# Patient Record
Sex: Female | Born: 1935 | Race: White | Hispanic: No | Smoking: Never smoker
Health system: Southern US, Community
[De-identification: ages and names within clinical notes are randomized; demographics above are authoritative.]

## PROBLEM LIST (undated history)

## (undated) DIAGNOSIS — I82609 Acute embolism and thrombosis of unspecified veins of unspecified upper extremity: Secondary | ICD-10-CM

## (undated) DIAGNOSIS — D649 Anemia, unspecified: Secondary | ICD-10-CM

## (undated) DIAGNOSIS — J690 Pneumonitis due to inhalation of food and vomit: Secondary | ICD-10-CM

## (undated) DIAGNOSIS — M1990 Unspecified osteoarthritis, unspecified site: Secondary | ICD-10-CM

## (undated) DIAGNOSIS — R000 Tachycardia, unspecified: Secondary | ICD-10-CM

## (undated) DIAGNOSIS — I499 Cardiac arrhythmia, unspecified: Secondary | ICD-10-CM

## (undated) DIAGNOSIS — K219 Gastro-esophageal reflux disease without esophagitis: Secondary | ICD-10-CM

## (undated) DIAGNOSIS — Z9289 Personal history of other medical treatment: Secondary | ICD-10-CM

## (undated) DIAGNOSIS — Z8719 Personal history of other diseases of the digestive system: Secondary | ICD-10-CM

## (undated) HISTORY — PX: BREAST ENHANCEMENT SURGERY: SHX7

## (undated) HISTORY — PX: ABDOMINAL HYSTERECTOMY: SHX81

## (undated) HISTORY — PX: APPENDECTOMY: SHX54

## (undated) HISTORY — PX: CATARACT EXTRACTION, BILATERAL: SHX1313

---

## 1998-12-02 ENCOUNTER — Other Ambulatory Visit: Admission: RE | Admit: 1998-12-02 | Discharge: 1998-12-02 | Payer: Self-pay | Admitting: Obstetrics and Gynecology

## 1999-07-29 ENCOUNTER — Encounter: Payer: Self-pay | Admitting: Neurosurgery

## 1999-07-29 ENCOUNTER — Encounter: Admission: RE | Admit: 1999-07-29 | Discharge: 1999-07-29 | Payer: Self-pay | Admitting: Neurosurgery

## 1999-12-30 ENCOUNTER — Other Ambulatory Visit: Admission: RE | Admit: 1999-12-30 | Discharge: 1999-12-30 | Payer: Self-pay | Admitting: Oral Surgery

## 2000-01-20 ENCOUNTER — Encounter: Payer: Self-pay | Admitting: Family Medicine

## 2000-01-20 ENCOUNTER — Encounter: Admission: RE | Admit: 2000-01-20 | Discharge: 2000-01-20 | Payer: Self-pay | Admitting: Family Medicine

## 2000-04-25 ENCOUNTER — Other Ambulatory Visit: Admission: RE | Admit: 2000-04-25 | Discharge: 2000-04-25 | Payer: Self-pay | Admitting: Obstetrics and Gynecology

## 2001-03-01 ENCOUNTER — Encounter: Payer: Self-pay | Admitting: Obstetrics and Gynecology

## 2001-03-01 ENCOUNTER — Encounter: Admission: RE | Admit: 2001-03-01 | Discharge: 2001-03-01 | Payer: Self-pay | Admitting: Obstetrics and Gynecology

## 2001-06-11 ENCOUNTER — Encounter: Payer: Self-pay | Admitting: Family Medicine

## 2001-06-11 ENCOUNTER — Encounter: Admission: RE | Admit: 2001-06-11 | Discharge: 2001-06-11 | Payer: Self-pay | Admitting: Family Medicine

## 2001-06-18 ENCOUNTER — Encounter: Payer: Self-pay | Admitting: Family Medicine

## 2001-06-18 ENCOUNTER — Encounter: Admission: RE | Admit: 2001-06-18 | Discharge: 2001-06-18 | Payer: Self-pay | Admitting: Family Medicine

## 2003-01-08 ENCOUNTER — Ambulatory Visit (HOSPITAL_COMMUNITY): Admission: RE | Admit: 2003-01-08 | Discharge: 2003-01-08 | Payer: Self-pay | Admitting: Neurosurgery

## 2003-01-08 ENCOUNTER — Encounter: Payer: Self-pay | Admitting: Neurosurgery

## 2003-05-13 ENCOUNTER — Inpatient Hospital Stay (HOSPITAL_COMMUNITY): Admission: RE | Admit: 2003-05-13 | Discharge: 2003-05-26 | Payer: Self-pay | Admitting: Neurosurgery

## 2003-10-31 ENCOUNTER — Encounter: Admission: RE | Admit: 2003-10-31 | Discharge: 2003-10-31 | Payer: Self-pay | Admitting: Obstetrics and Gynecology

## 2005-04-05 ENCOUNTER — Encounter: Admission: RE | Admit: 2005-04-05 | Discharge: 2005-04-05 | Payer: Self-pay | Admitting: Family Medicine

## 2005-08-08 ENCOUNTER — Ambulatory Visit: Payer: Self-pay | Admitting: Oncology

## 2005-09-15 LAB — CBC & DIFF AND RETIC
BASO%: 0.7 % (ref 0.0–2.0)
Basophils Absolute: 0.1 10*3/uL (ref 0.0–0.1)
EOS%: 2.6 % (ref 0.0–7.0)
HCT: 40.8 % (ref 34.8–46.6)
HGB: 13.5 g/dL (ref 11.6–15.9)
LYMPH%: 19 % (ref 14.0–48.0)
MCH: 31 pg (ref 26.0–34.0)
MCHC: 33 g/dL (ref 32.0–36.0)
MCV: 93.7 fL (ref 81.0–101.0)
MONO%: 7.9 % (ref 0.0–13.0)
NEUT%: 69.8 % (ref 39.6–76.8)

## 2005-12-20 ENCOUNTER — Ambulatory Visit: Payer: Self-pay | Admitting: Oncology

## 2006-03-24 ENCOUNTER — Inpatient Hospital Stay (HOSPITAL_COMMUNITY): Admission: EM | Admit: 2006-03-24 | Discharge: 2006-04-02 | Payer: Self-pay | Admitting: Emergency Medicine

## 2006-03-24 ENCOUNTER — Ambulatory Visit: Payer: Self-pay | Admitting: Internal Medicine

## 2006-03-27 ENCOUNTER — Encounter: Payer: Self-pay | Admitting: Cardiology

## 2006-04-11 ENCOUNTER — Inpatient Hospital Stay (HOSPITAL_COMMUNITY): Admission: EM | Admit: 2006-04-11 | Discharge: 2006-04-17 | Payer: Self-pay | Admitting: Emergency Medicine

## 2006-05-22 ENCOUNTER — Encounter: Payer: Self-pay | Admitting: Emergency Medicine

## 2006-05-22 ENCOUNTER — Ambulatory Visit: Payer: Self-pay | Admitting: *Deleted

## 2006-05-23 ENCOUNTER — Inpatient Hospital Stay (HOSPITAL_COMMUNITY): Admission: EM | Admit: 2006-05-23 | Discharge: 2006-05-30 | Payer: Self-pay | Admitting: *Deleted

## 2006-10-18 ENCOUNTER — Encounter: Admission: RE | Admit: 2006-10-18 | Discharge: 2006-10-18 | Payer: Self-pay | Admitting: Family Medicine

## 2008-01-25 ENCOUNTER — Ambulatory Visit: Payer: Self-pay | Admitting: Oncology

## 2010-03-29 ENCOUNTER — Ambulatory Visit: Payer: Self-pay | Admitting: Oncology

## 2010-03-31 ENCOUNTER — Observation Stay (HOSPITAL_COMMUNITY)
Admission: AD | Admit: 2010-03-31 | Discharge: 2010-04-01 | Payer: Self-pay | Source: Home / Self Care | Attending: Oncology | Admitting: Oncology

## 2010-03-31 LAB — CBC WITH DIFFERENTIAL/PLATELET
BASO%: 0.5 % (ref 0.0–2.0)
EOS%: 0 % (ref 0.0–7.0)
LYMPH%: 7.8 % — ABNORMAL LOW (ref 14.0–49.7)
MCH: 16.5 pg — ABNORMAL LOW (ref 25.1–34.0)
MCHC: 28.4 g/dL — ABNORMAL LOW (ref 31.5–36.0)
MCV: 58.1 fL — ABNORMAL LOW (ref 79.5–101.0)
MONO%: 3.6 % (ref 0.0–14.0)
NEUT#: 15.1 10*3/uL — ABNORMAL HIGH (ref 1.5–6.5)
Platelets: 1134 10*3/uL — ABNORMAL HIGH (ref 145–400)
RBC: 3.96 10*6/uL (ref 3.70–5.45)
RDW: 21.7 % — ABNORMAL HIGH (ref 11.2–14.5)

## 2010-03-31 LAB — COMPREHENSIVE METABOLIC PANEL
ALT: 13 U/L (ref 0–35)
CO2: 21 mEq/L (ref 19–32)
Calcium: 9.4 mg/dL (ref 8.4–10.5)
Chloride: 103 mEq/L (ref 96–112)
Creatinine, Ser: 0.92 mg/dL (ref 0.40–1.20)
Glucose, Bld: 124 mg/dL — ABNORMAL HIGH (ref 70–99)
Total Bilirubin: 0.2 mg/dL — ABNORMAL LOW (ref 0.3–1.2)
Total Protein: 6.8 g/dL (ref 6.0–8.3)

## 2010-03-31 LAB — CHCC SMEAR

## 2010-03-31 LAB — FERRITIN: Ferritin: 3 ng/mL — ABNORMAL LOW (ref 10–291)

## 2010-03-31 LAB — VITAMIN B12: Vitamin B-12: 418 pg/mL (ref 211–911)

## 2010-04-08 LAB — CBC WITH DIFFERENTIAL/PLATELET
Basophils Absolute: 0.1 10*3/uL (ref 0.0–0.1)
EOS%: 0 % (ref 0.0–7.0)
HGB: 9.9 g/dL — ABNORMAL LOW (ref 11.6–15.9)
LYMPH%: 15.9 % (ref 14.0–49.7)
MCH: 19.8 pg — ABNORMAL LOW (ref 25.1–34.0)
MCV: 66.1 fL — ABNORMAL LOW (ref 79.5–101.0)
MONO%: 4.5 % (ref 0.0–14.0)
NEUT%: 79.1 % — ABNORMAL HIGH (ref 38.4–76.8)
Platelets: 847 10*3/uL — ABNORMAL HIGH (ref 145–400)
RDW: 31.8 % — ABNORMAL HIGH (ref 11.2–14.5)

## 2010-04-21 ENCOUNTER — Ambulatory Visit (HOSPITAL_BASED_OUTPATIENT_CLINIC_OR_DEPARTMENT_OTHER): Payer: MEDICARE | Admitting: Oncology

## 2010-04-21 LAB — CBC WITH DIFFERENTIAL/PLATELET
BASO%: 1.3 % (ref 0.0–2.0)
Basophils Absolute: 0.2 10*3/uL — ABNORMAL HIGH (ref 0.0–0.1)
EOS%: 0.1 % (ref 0.0–7.0)
Eosinophils Absolute: 0 10*3/uL (ref 0.0–0.5)
HCT: 35.2 % (ref 34.8–46.6)
HGB: 9.8 g/dL — ABNORMAL LOW (ref 11.6–15.9)
LYMPH%: 25.5 % (ref 14.0–49.7)
MCH: 19.7 pg — ABNORMAL LOW (ref 25.1–34.0)
MCHC: 27.8 g/dL — ABNORMAL LOW (ref 31.5–36.0)
MCV: 70.8 fL — ABNORMAL LOW (ref 79.5–101.0)
MONO#: 1.2 10*3/uL — ABNORMAL HIGH (ref 0.1–0.9)
MONO%: 9 % (ref 0.0–14.0)
NEUT#: 8.7 10*3/uL — ABNORMAL HIGH (ref 1.5–6.5)
NEUT%: 64.1 % (ref 38.4–76.8)
Platelets: 795 10*3/uL — ABNORMAL HIGH (ref 145–400)
RBC: 4.97 10*6/uL (ref 3.70–5.45)
RDW: 28.5 % — ABNORMAL HIGH (ref 11.2–14.5)
WBC: 13.5 10*3/uL — ABNORMAL HIGH (ref 3.9–10.3)
lymph#: 3.4 10*3/uL — ABNORMAL HIGH (ref 0.9–3.3)
nRBC: 0 % (ref 0–0)

## 2010-05-05 LAB — CBC WITH DIFFERENTIAL/PLATELET
BASO%: 1 % (ref 0.0–2.0)
Basophils Absolute: 0.1 10*3/uL (ref 0.0–0.1)
EOS%: 0.1 % (ref 0.0–7.0)
Eosinophils Absolute: 0 10*3/uL (ref 0.0–0.5)
HCT: 34.7 % — ABNORMAL LOW (ref 34.8–46.6)
HGB: 10.6 g/dL — ABNORMAL LOW (ref 11.6–15.9)
LYMPH%: 28.2 % (ref 14.0–49.7)
MCH: 22.5 pg — ABNORMAL LOW (ref 25.1–34.0)
MCHC: 30.6 g/dL — ABNORMAL LOW (ref 31.5–36.0)
MCV: 73.5 fL — ABNORMAL LOW (ref 79.5–101.0)
MONO#: 0.8 10*3/uL (ref 0.1–0.9)
MONO%: 8.2 % (ref 0.0–14.0)
NEUT#: 6.1 10*3/uL (ref 1.5–6.5)
NEUT%: 62.5 % (ref 38.4–76.8)
Platelets: 793 10*3/uL — ABNORMAL HIGH (ref 145–400)
RBC: 4.72 10*6/uL (ref 3.70–5.45)
RDW: 35.1 % — ABNORMAL HIGH (ref 11.2–14.5)
WBC: 9.7 10*3/uL (ref 3.9–10.3)
lymph#: 2.7 10*3/uL (ref 0.9–3.3)

## 2010-05-09 ENCOUNTER — Encounter: Payer: Self-pay | Admitting: Family Medicine

## 2010-05-21 ENCOUNTER — Encounter (HOSPITAL_BASED_OUTPATIENT_CLINIC_OR_DEPARTMENT_OTHER): Payer: MEDICARE | Admitting: Oncology

## 2010-05-21 DIAGNOSIS — D473 Essential (hemorrhagic) thrombocythemia: Secondary | ICD-10-CM

## 2010-05-21 DIAGNOSIS — D509 Iron deficiency anemia, unspecified: Secondary | ICD-10-CM

## 2010-05-21 DIAGNOSIS — R000 Tachycardia, unspecified: Secondary | ICD-10-CM

## 2010-05-21 DIAGNOSIS — L439 Lichen planus, unspecified: Secondary | ICD-10-CM

## 2010-05-21 DIAGNOSIS — D472 Monoclonal gammopathy: Secondary | ICD-10-CM

## 2010-05-21 LAB — CBC WITH DIFFERENTIAL/PLATELET
BASO%: 0.7 % (ref 0.0–2.0)
Eosinophils Absolute: 0 10*3/uL (ref 0.0–0.5)
LYMPH%: 18.8 % (ref 14.0–49.7)
MCHC: 31 g/dL — ABNORMAL LOW (ref 31.5–36.0)
MONO#: 0.7 10*3/uL (ref 0.1–0.9)
NEUT#: 9.3 10*3/uL — ABNORMAL HIGH (ref 1.5–6.5)
RBC: 5.15 10*6/uL (ref 3.70–5.45)
RDW: 29.1 % — ABNORMAL HIGH (ref 11.2–14.5)
WBC: 12.4 10*3/uL — ABNORMAL HIGH (ref 3.9–10.3)
lymph#: 2.3 10*3/uL (ref 0.9–3.3)

## 2010-06-03 ENCOUNTER — Emergency Department (HOSPITAL_COMMUNITY): Payer: MEDICARE

## 2010-06-03 ENCOUNTER — Inpatient Hospital Stay (HOSPITAL_COMMUNITY)
Admission: EM | Admit: 2010-06-03 | Discharge: 2010-06-17 | DRG: 391 | Disposition: A | Discharge status: Home Health | Payer: MEDICARE | Attending: Internal Medicine | Admitting: Internal Medicine

## 2010-06-03 DIAGNOSIS — F3289 Other specified depressive episodes: Secondary | ICD-10-CM | POA: Diagnosis present

## 2010-06-03 DIAGNOSIS — A09 Infectious gastroenteritis and colitis, unspecified: Principal | ICD-10-CM | POA: Diagnosis present

## 2010-06-03 DIAGNOSIS — G4700 Insomnia, unspecified: Secondary | ICD-10-CM | POA: Diagnosis present

## 2010-06-03 DIAGNOSIS — R0789 Other chest pain: Secondary | ICD-10-CM | POA: Diagnosis present

## 2010-06-03 DIAGNOSIS — D649 Anemia, unspecified: Secondary | ICD-10-CM | POA: Diagnosis present

## 2010-06-03 DIAGNOSIS — F329 Major depressive disorder, single episode, unspecified: Secondary | ICD-10-CM | POA: Diagnosis present

## 2010-06-03 DIAGNOSIS — E860 Dehydration: Secondary | ICD-10-CM | POA: Diagnosis present

## 2010-06-03 DIAGNOSIS — G8929 Other chronic pain: Secondary | ICD-10-CM | POA: Diagnosis present

## 2010-06-03 DIAGNOSIS — R5381 Other malaise: Secondary | ICD-10-CM | POA: Diagnosis present

## 2010-06-03 DIAGNOSIS — I498 Other specified cardiac arrhythmias: Secondary | ICD-10-CM | POA: Diagnosis present

## 2010-06-03 DIAGNOSIS — M549 Dorsalgia, unspecified: Secondary | ICD-10-CM | POA: Diagnosis present

## 2010-06-03 DIAGNOSIS — F411 Generalized anxiety disorder: Secondary | ICD-10-CM | POA: Diagnosis present

## 2010-06-03 DIAGNOSIS — J189 Pneumonia, unspecified organism: Secondary | ICD-10-CM | POA: Diagnosis present

## 2010-06-03 DIAGNOSIS — E876 Hypokalemia: Secondary | ICD-10-CM | POA: Diagnosis present

## 2010-06-03 DIAGNOSIS — K219 Gastro-esophageal reflux disease without esophagitis: Secondary | ICD-10-CM | POA: Diagnosis present

## 2010-06-03 DIAGNOSIS — D473 Essential (hemorrhagic) thrombocythemia: Secondary | ICD-10-CM | POA: Diagnosis present

## 2010-06-03 LAB — DIFFERENTIAL
Basophils Absolute: 0.2 10*3/uL — ABNORMAL HIGH (ref 0.0–0.1)
Eosinophils Relative: 0 % (ref 0–5)
Lymphocytes Relative: 8 % — ABNORMAL LOW (ref 12–46)
Lymphs Abs: 1.2 10*3/uL (ref 0.7–4.0)
Monocytes Relative: 4 % (ref 3–12)
Neutrophils Relative %: 87 % — ABNORMAL HIGH (ref 43–77)

## 2010-06-03 LAB — LIPASE, BLOOD: Lipase: 52 U/L (ref 11–59)

## 2010-06-03 LAB — COMPREHENSIVE METABOLIC PANEL
ALT: 15 U/L (ref 0–35)
Albumin: 3.2 g/dL — ABNORMAL LOW (ref 3.5–5.2)
Alkaline Phosphatase: 56 U/L (ref 39–117)
Chloride: 108 mEq/L (ref 96–112)
Glucose, Bld: 186 mg/dL — ABNORMAL HIGH (ref 70–99)
Potassium: 3.9 mEq/L (ref 3.5–5.1)
Sodium: 138 mEq/L (ref 135–145)
Total Bilirubin: 0.2 mg/dL — ABNORMAL LOW (ref 0.3–1.2)
Total Protein: 6.8 g/dL (ref 6.0–8.3)

## 2010-06-03 LAB — CBC
HCT: 35.2 % — ABNORMAL LOW (ref 36.0–46.0)
MCV: 77 fL — ABNORMAL LOW (ref 78.0–100.0)
RBC: 4.57 MIL/uL (ref 3.87–5.11)
RDW: 21.4 % — ABNORMAL HIGH (ref 11.5–15.5)
WBC: 15 10*3/uL — ABNORMAL HIGH (ref 4.0–10.5)

## 2010-06-03 MED ORDER — IOHEXOL 300 MG/ML  SOLN
80.0000 mL | Freq: Once | INTRAMUSCULAR | Status: AC | PRN
  Administered 2010-06-03: 80 mL via INTRAVENOUS

## 2010-06-04 LAB — DIFFERENTIAL
Basophils Relative: 0 % (ref 0–1)
Eosinophils Absolute: 0 10*3/uL (ref 0.0–0.7)
Neutro Abs: 7 10*3/uL (ref 1.7–7.7)
Neutrophils Relative %: 63 % (ref 43–77)

## 2010-06-04 LAB — URINE MICROSCOPIC-ADD ON

## 2010-06-04 LAB — CBC
Hemoglobin: 9.4 g/dL — ABNORMAL LOW (ref 12.0–15.0)
Platelets: 659 10*3/uL — ABNORMAL HIGH (ref 150–400)
RBC: 4.12 MIL/uL (ref 3.87–5.11)
WBC: 11.1 10*3/uL — ABNORMAL HIGH (ref 4.0–10.5)

## 2010-06-04 LAB — COMPREHENSIVE METABOLIC PANEL
ALT: 13 U/L (ref 0–35)
AST: 22 U/L (ref 0–37)
Albumin: 2.8 g/dL — ABNORMAL LOW (ref 3.5–5.2)
Alkaline Phosphatase: 47 U/L (ref 39–117)
Chloride: 106 mEq/L (ref 96–112)
Creatinine, Ser: 0.73 mg/dL (ref 0.4–1.2)
GFR calc Af Amer: >60 mL/min (ref >60)
Potassium: 3.7 mEq/L (ref 3.5–5.1)
Sodium: 139 mEq/L (ref 135–145)
Total Bilirubin: 0.2 mg/dL — ABNORMAL LOW (ref 0.3–1.2)

## 2010-06-04 LAB — URINALYSIS, ROUTINE W REFLEX MICROSCOPIC
Bilirubin Urine: NEGATIVE
Hgb urine dipstick: NEGATIVE
Ketones, ur: 15 mg/dL — AB
Specific Gravity, Urine: 1.031 — ABNORMAL HIGH (ref 1.005–1.030)
Urobilinogen, UA: 0.2 mg/dL (ref 0.0–1.0)
pH: 7.5 (ref 5.0–8.0)

## 2010-06-04 LAB — T4, FREE: Free T4: 1.02 ng/dL (ref 0.80–1.80)

## 2010-06-04 LAB — CARDIAC PANEL(CRET KIN+CKTOT+MB+TROPI)
CK, MB: 3.1 ng/mL (ref 0.3–4.0)
Total CK: 46 U/L (ref 7–177)
Troponin I: 0.01 ng/mL (ref 0.00–0.06)
Troponin I: <0.01 ng/mL (ref 0.00–0.06)

## 2010-06-04 LAB — TSH: TSH: 1.221 u[IU]/mL (ref 0.350–4.500)

## 2010-06-04 LAB — T3, FREE: T3, Free: 2.2 pg/mL — ABNORMAL LOW (ref 2.3–4.2)

## 2010-06-05 LAB — CBC
HCT: 36.2 % (ref 36.0–46.0)
Platelets: 636 10*3/uL — ABNORMAL HIGH (ref 150–400)
RDW: 21.1 % — ABNORMAL HIGH (ref 11.5–15.5)
WBC: 11.1 10*3/uL — ABNORMAL HIGH (ref 4.0–10.5)

## 2010-06-05 LAB — BASIC METABOLIC PANEL
GFR calc non Af Amer: >60 mL/min (ref >60)
Glucose, Bld: 109 mg/dL — ABNORMAL HIGH (ref 70–99)
Potassium: 3.4 mEq/L — ABNORMAL LOW (ref 3.5–5.1)
Sodium: 141 mEq/L (ref 135–145)

## 2010-06-05 LAB — MAGNESIUM: Magnesium: 2 mg/dL (ref 1.5–2.5)

## 2010-06-06 LAB — BASIC METABOLIC PANEL
CO2: 23 mEq/L (ref 19–32)
Glucose, Bld: 90 mg/dL (ref 70–99)
Potassium: 3.3 mEq/L — ABNORMAL LOW (ref 3.5–5.1)
Sodium: 139 mEq/L (ref 135–145)

## 2010-06-06 LAB — CBC
HCT: 35 % — ABNORMAL LOW (ref 36.0–46.0)
Hemoglobin: 10.2 g/dL — ABNORMAL LOW (ref 12.0–15.0)
RBC: 4.5 MIL/uL (ref 3.87–5.11)
WBC: 9.3 10*3/uL (ref 4.0–10.5)

## 2010-06-07 NOTE — H&P (Signed)
NAMEPALMER, [name]                ACCOUNT NO.:  [id number]  MEDICAL RECORD NO.:  [id number]           PATIENT TYPE:  LOCATION:                                 FACILITY:  PHYSICIAN:  Talmage Nap, MD  DATE OF BIRTH:  Apr 16, 1933  DATE OF ADMISSION: 06/03/2010 DATE OF DISCHARGE:                             HISTORY & PHYSICAL   PRIMARY CARE PHYSICIAN:  Unassigned.  History obtainable from the patient.  CHIEF COMPLAINT:  Upper abdominal pain and vomiting of 1 day's duration.  The patient is a 77 year old Caucasian female who is said to be in stable health presenting to the emergency room with 1-day history of upper abdominal pain which she described as colicky about 8/10 in intensity with multiple episodes of vomiting.  The patient claims she vomited several times and vomitus was nonbloody and non-projectile.  She denied any history of diarrhea.  She said she had subjective feeling of fever.  She denied any chills.  She denied any rigor.  While the patient was having multiple episodes of vomiting, she started experiencing palpitations and mild shortness of breath.  She, however, denied any history of PND and orthopnea and subsequently called 911 and was brought to the emergency room to be evaluated.  Past medical history is positive for: 1. Anxiety disorder. 2. Chronic back pain. 3. Depression. 4. GERD.  PAST SURGICAL HISTORY: 1. Appendectomy. 2. Hysterectomy. 3. Lower back surgery, complicated by right footdrop with shortening     of the right leg with paresthesia.  Preadmission meds without dosages include: 1. Gabapentin. 2. OxyContin. 3. Protonix. 4. Zoloft. 5. Percocet. 6. Ambien. 7. Multivitamin. 8. Calcium.  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  FAMILY HISTORY:  Said to be positive for hypertension.  REVIEW OF SYSTEMS:  The patient denies any history of headaches.  No  blurred vision.  Complained of nausea with multiple episodes of vomiting with subjective feeling of fever.  Denies any chills or rigor.  Presently, denies any chest pain or shortness of breath.  She also denies any palpitation.  She denies any PND or orthopnea.  She complained of mild upper abdominal discomfort.  Denies any diarrhea or hematochezia.  No dysuria or hematuria.  No swelling of the lower extremities.  No intolerance to heat or cold.  No neuropsychiatric disorder.  PHYSICAL EXAMINATION:  GENERAL:  Elderly lady, moderately dehydrated not in any obvious respiratory distress at present. VITAL SIGNS:  Blood pressure is 135/72, pulse is 107, respiratory rate 18, temperature is 97.4. HEENT:  Pupils are reactive to light and extraocular muscles are intact. NECK:  No jugular venous distention.  No carotid bruit.  No lymphadenopathy. CHEST:  Clear to auscultation.  Heart sounds, S1 and S2, tachycardic. ABDOMEN:  Soft with periumbilical as well as epigastric tenderness.  No guarding.  No rigidity.  Liver, spleen, kidney not palpable.  Bowel sounds are positive. EXTREMITIES:  No pedal edema. NEUROLOGIC:  Nonfocal. MUSCULOSKELETAL:  Arthritic changes in the knees and in the feet. NEUROPSYCHIATRIC:  Unremarkable. SKIN:  Decreased turgor.  LABORATORY  DATA:   Initial hematological indices showed WBC of 15.0, hemoglobin of 10.7, hematocrit 35.2, MCV of 77.0 with a platelet count of 655,000, neutrophils 87%, and absolute granulocyte count is 13.0.  Lipase is 52 normal.  Chemistry showed sodium of 138, potassium of 3.9, chloride of 108 with a bicarb of 21, glucose is 186, BUN is 16, creatinine 0.83.  LFT normal.  Imaging studies done on the patient include acute abdominal series showed bibasilar atelectasis with no evidence of obstruction.  There is segmental colitis involving transverse colon and this differential include ischemic colitis, infectious colitis, inflammatory  bowel disease.  There is moderate hiatal hernia.  CT of the abdomen and pelvis with contrast showed bibasilar atelectasis without evidence of active infection.  There is segmental colitis involving the transverse colon, ischemic colitis, infectious colitis, or inflammatory bowel disease, mild colitis.  An acute abdominal series showed bibasilar airspace disease concerning for pneumonia.  There is no evidence of obstruction or intraperitoneal free air.  There is a moderate size hiatal hernia.  IMPRESSION: 1. Ischemic colitis. 2. Dehydration. 3. Tachycardia. 4. Gastroesophageal reflux disease. 5. Anxiety disorder. 6. Depression. 7. Anemia. 8. Thrombocytosis, most likely secondary to anemia.  Plan is to admit the patient to general medical floor.  The patient will be adequately rehydrated with normal saline IV to go at a rate of 80 mL an hour.  She will be on Protonix 40 mg IV q.24 and then Dilaudid 2 mg IV q.4 p.r.n. for pain control.  Antibiotics to be given to the patient will include Cipro 400 mg IV q.12, Flagyl 500 mg IV q.8 h.  Her tachycardia will be controlled with Lopressor 25 mg p.o. b.i.d. and she will also be given Zoloft 100 mg p.o. daily for her depression.  GI prophylaxis with protonix and TED stockings for DVT prophylaxis.  Further labs to be ordered onthis patient will include blood culture x2 before starting IV antibiotics, cardiac enzymes q.6 x3, thyroid panel which will include TSH, CMP, T4.  CBC, CMP, and magnesium will be repeated in a.m.  The patient will be followed and evaluated on day-to-day basis.     Talmage Nap, MD     CN/MEDQ  D:  06/04/2010  T:  06/04/2010  Job:  [id number]  Electronically Signed by Talmage Nap  on 06/07/2010 06:50:21 PM

## 2010-06-08 LAB — BASIC METABOLIC PANEL
CO2: 24 mEq/L (ref 19–32)
Calcium: 8.6 mg/dL (ref 8.4–10.5)
Chloride: 107 mEq/L (ref 96–112)
Glucose, Bld: 89 mg/dL (ref 70–99)
Sodium: 143 mEq/L (ref 135–145)

## 2010-06-08 LAB — CBC
HCT: 34.6 % — ABNORMAL LOW (ref 36.0–46.0)
Hemoglobin: 10.2 g/dL — ABNORMAL LOW (ref 12.0–15.0)
MCH: 23 pg — ABNORMAL LOW (ref 26.0–34.0)
MCHC: 29.5 g/dL — ABNORMAL LOW (ref 30.0–36.0)

## 2010-06-09 ENCOUNTER — Inpatient Hospital Stay (HOSPITAL_COMMUNITY): Payer: MEDICARE

## 2010-06-09 LAB — BASIC METABOLIC PANEL
Chloride: 110 mEq/L (ref 96–112)
GFR calc non Af Amer: >60 mL/min (ref >60)
Potassium: 3.1 mEq/L — ABNORMAL LOW (ref 3.5–5.1)
Sodium: 139 mEq/L (ref 135–145)

## 2010-06-09 LAB — CBC
HCT: 31.4 % — ABNORMAL LOW (ref 36.0–46.0)
Platelets: 469 10*3/uL — ABNORMAL HIGH (ref 150–400)
RBC: 4.02 MIL/uL (ref 3.87–5.11)
RDW: 20.6 % — ABNORMAL HIGH (ref 11.5–15.5)
WBC: 9.7 10*3/uL (ref 4.0–10.5)

## 2010-06-10 LAB — MAGNESIUM: Magnesium: 1.6 mg/dL (ref 1.5–2.5)

## 2010-06-10 LAB — POTASSIUM: Potassium: 3.5 mEq/L (ref 3.5–5.1)

## 2010-06-10 LAB — CULTURE, BLOOD (ROUTINE X 2)
Culture  Setup Time: 201202170940
Culture: NO GROWTH

## 2010-06-11 ENCOUNTER — Inpatient Hospital Stay (HOSPITAL_COMMUNITY): Payer: MEDICARE

## 2010-06-11 LAB — CARDIAC PANEL(CRET KIN+CKTOT+MB+TROPI)
CK, MB: 1.4 ng/mL (ref 0.3–4.0)
Relative Index: INVALID (ref 0.0–2.5)
Total CK: 30 U/L (ref 7–177)
Total CK: 28 U/L (ref 7–177)

## 2010-06-12 LAB — CBC
MCH: 23.3 pg — ABNORMAL LOW (ref 26.0–34.0)
Platelets: 438 10*3/uL — ABNORMAL HIGH (ref 150–400)
RBC: 3.91 MIL/uL (ref 3.87–5.11)
WBC: 9.2 10*3/uL (ref 4.0–10.5)

## 2010-06-12 LAB — CARDIAC PANEL(CRET KIN+CKTOT+MB+TROPI)
Relative Index: INVALID (ref 0.0–2.5)
Total CK: 27 U/L (ref 7–177)
Troponin I: <0.01 ng/mL (ref 0.00–0.06)

## 2010-06-12 LAB — BASIC METABOLIC PANEL
Chloride: 109 mEq/L (ref 96–112)
Creatinine, Ser: 0.64 mg/dL (ref 0.4–1.2)
GFR calc Af Amer: >60 mL/min (ref >60)
Sodium: 138 mEq/L (ref 135–145)

## 2010-06-13 LAB — POTASSIUM: Potassium: 3.5 mEq/L (ref 3.5–5.1)

## 2010-06-14 ENCOUNTER — Inpatient Hospital Stay (HOSPITAL_COMMUNITY): Payer: MEDICARE

## 2010-06-14 LAB — BASIC METABOLIC PANEL
BUN: 1 mg/dL — ABNORMAL LOW (ref 6–23)
Chloride: 111 mEq/L (ref 96–112)
GFR calc non Af Amer: >60 mL/min (ref >60)
Glucose, Bld: 100 mg/dL — ABNORMAL HIGH (ref 70–99)
Potassium: 3.9 mEq/L (ref 3.5–5.1)

## 2010-06-15 LAB — CLOSTRIDIUM DIFFICILE BY PCR: Toxigenic C. Difficile by PCR: NEGATIVE

## 2010-06-24 NOTE — Discharge Summary (Signed)
NAMEGABBY, [name]                ACCOUNT NO.:  [id number]  MEDICAL RECORD NO.:  [id number]           PATIENT TYPE:  I  LOCATION:  2003                         FACILITY:  [facility]  PHYSICIAN:  Baltazar Najjar, MD     DATE OF BIRTH:  June 22, 1932  DATE OF ADMISSION:  06/03/2010 DATE OF DISCHARGE:                              DISCHARGE SUMMARY   DATE OF DISCHARGE:  To be determined, possibly tomorrow.  FINAL DISCHARGE DIAGNOSES: 1. Colitis presumed to be infectious. 2. Gastroesophageal reflux disease. 3. Atypical chest pain, resolved. 4. Healthcare-associated pneumonia. 5. History of depression. 6. Papular rash of lower back and buttocks area.  RADIOLOGY/IMAGING: 1. Abdominal x-ray on June 03, 2010 showed bibasilar airspace     disease concerning for early pneumonia or potential pulmonary     edema.  No evidence of bowel obstruction or intraperitoneal free     air.  Also showed moderate-sized hiatal hernia. 2. CT abdomen and pelvis done on June 03, 2010 showed bibasilar     atelectasis with without clear any evidence of acute infection.     Moderate-sized hiatal hernia.  Segmental colitis involving the     transverse colon.  Differential includes ischemic colitis,     infectious colitis, or inflammatory bowel disease, and is described     as mild colitis. 3. Repeat abdominal x-ray on June 09, 2010 showed increase in     opacity in the left lung base consistent with effusion, atelectasis     or possibly pneumonia.  Few scattered air-fluid levels,     questionable ileus. 4. Chest x-ray on June 09, 2010 showed left lower lobe infiltrate.     Repeat chest x-ray June 11, 2010 showed progress left lower     lobe pneumonia, and repeat chest x-ray on June 14, 2010 showed     left basilar airspace disease, possibly secondary to pneumonia.     Small left pleural effusion.  BRIEF ADMITTING HISTORY:  Please refer to the H and P for more details. In summary, [name] is a 77 year old pleasant Caucasian woman with the above medical history presented to the ER on June 04, 2010 with a chief complaint of upper abdominal pain associated with vomiting and nausea.  HOSPITAL COURSE: 1. The patient was worked up in the ER including CT scan revealed     colitis.  The patient was admitted with a diagnosis of colitis     which was likely infectious.  She was started on IV fluids, kept     n.p.o., and empirically started on ciprofloxacin and Flagyl.  The     patient was found to be allergic to CIPROFLOXACIN.  She developed a     papular rash and her antibiotic switched to Zosyn, and she was     continued on the Flagyl with improvement in her symptoms.  She was     subsequently transitioned to p.o. Flagyl and p.o. cefixime. 2. Healthcare-associated pneumonia, as per x-rays as above.  The     patient was started on Zosyn and vancomycin and cefixime was     discontinued that can  be transitioned to Avelox on discharge. 3. Atypical chest pain.  There was no new changes in her EKG and     negative cardiac enzymes most likely musculoskeletal.     Recommendation is Tylenol p.r.n. 4. GERD.  The patient to continue PPI. 5. Papular rash in the lower back and buttocks.  The patient was     treated with fluconazole for 3 days for possible yeast infection. 6. Physical deconditioning.  The patient was seen by PT, OT and plan     is for home health PT/OT. 7. The patient was seen and examined by me today. 8. Diarrhea.  The patient had been complaining of diarrhea for the     last couple of days.  Stool was sent for C. diff PCR, which came     back negative.  She was initially placed on contact isolation,     which was removed after the results.  The patient is already on     Flagyl for colitis to continue and complete at least 14 days of     therapy.  We will give her Imodium p.r.n. for symptoms management. 9. The patient seen and examined by me today and she is  not feeling     well enough to go home today and plan is for possible discharge to     home with home health PT tomorrow.  MEDICATION:  Discharge medication to be reconciled by the discharging physician in the a.m.  CONDITION:  Stable.  FOLLOWUP:  The patient to follow with PCP within 1 week of discharge.          ______________________________ Baltazar Najjar, MD     SA/MEDQ  D:  06/15/2010  T:  06/15/2010  Job:  [id number]  Electronically Signed by Hannah Beat MD on 06/23/2010 09:40:49 PM

## 2010-06-28 LAB — CBC
HCT: 30.1 % — ABNORMAL LOW (ref 36.0–46.0)
Hemoglobin: 8.4 g/dL — ABNORMAL LOW (ref 12.0–15.0)
WBC: 11.5 10*3/uL — ABNORMAL HIGH (ref 4.0–10.5)

## 2010-06-29 LAB — CROSSMATCH
ABO/RH(D): O POS
Antibody Screen: NEGATIVE
Unit division: 0
Unit division: 0

## 2010-06-29 LAB — ABO/RH: ABO/RH(D): O POS

## 2010-06-29 NOTE — Discharge Summary (Signed)
[name], [name]                ACCOUNT NO.:  [id number]  MEDICAL RECORD NO.:  [id number]          PATIENT TYPE:  LOCATION:                                 FACILITY:  PHYSICIAN:  Erick Blinks, MD     DATE OF BIRTH:  03-12-1933  DATE OF ADMISSION: DATE OF DISCHARGE:                              DISCHARGE SUMMARY   PRIMARY CARE PHYSICIAN:  L. Lupe Carney, MD, from [facility].  DISCHARGE DIAGNOSES: 1. Colitis, presumed to be infectious, improved. 2. Nausea/vomiting secondary to colitis, improved. 3. Healthcare-acquired pneumonia, resolved. 4. Gastroesophageal reflux disease. 5. Deconditioning. 6. History of depression. 7. Atypical chest pain, resolved. 8. Papular rash on the lower back and buttocks, thought to be     secondary to yeast infection, treated with fluconazole.  DISCHARGE MEDICATIONS: 1. Imodium 2 mg by mouth every 4 hours as needed. 2. Metoprolol 25 mg b.i.d. p.o. 3. Flagyl 500 mg by mouth 3 times a day for 3 more days. 4. Questran 1 packet by mouth t.i.d. with meals. 5. Protonix 40 mg 1 tablet by mouth twice daily. 6. Aspirin enteric-coated 81 mg 1 tablet by mouth every morning. 7. Compazine 5 mg 1 tablet by mouth daily. 8. Ferrous sulfate 325 mg 1 tablet by mouth twice daily. 9. Neurontin 300 mg 1 capsule by mouth twice daily. 10.Norco 10/325 mg 1 tablet by mouth every 6 hours as needed. 11.OxyContin 40 mg 1 tablet by mouth every 8 hours. 12.Zoloft 100 mg by mouth daily on Monday, Wednesday, and Friday.  ADMISSION HISTORY AND HOSPITAL COURSE:  Please refer to the discharge summary dictated by Dr. Cleotis Lema on June 15, 2010.  ADDENDUM TO HOSPITAL COURSE: 1. Colitis:  The patient had improvement in her diarrhea.  Her     antibiotics for her pneumonia were discontinued and this further     improved her diarrhea.  She reports having some leaking of liquid     stool which she passes her urine.  She does not have any     incontinence otherwise,  but does pass a small amount of stool when     she passes her urine.  This was discussed with Dr. Madilyn Fireman from GI     and it was felt that the patient should be continued on Imodium and     as well as Questran and since this is an acute issue hopefully as     the patient does improve this issue should also resolve.  If it     does persist at the outpatient setting, then they would be happy to     see her in outpatient consultation. 2. The remainder of the patient's medical issues have been stable. 3. Deconditioning:  The patient will be set up with home health PT as     well as a rolling walker.  She will need to follow up with Dr.     Clovis Riley in 1 week and will need to follow with [facility] as needed.     She should continue on a heart-healthy diet, conduct her activity     as tolerated.  TIME SPENT  ON THIS DISCHARGE:  40 minutes.     Erick Blinks, MD     JM/MEDQ  D:  06/17/2010  T:  06/17/2010  Job:  [id number]  cc:   L. Lupe Carney, M.D.  Electronically Signed by Erick Blinks  on 06/29/2010 06:31:29 PM

## 2010-07-15 NOTE — Progress Notes (Signed)
  NAMELEDA, BELLEFEUILLE                ACCOUNT NO.:  [id number]  MEDICAL RECORD NO.:  [id number]           PATIENT TYPE:  I  LOCATION:  3010                         FACILITY:  [facility]  PHYSICIAN:  Peggye Pitt, M.D. DATE OF BIRTH:  1933-04-05                                PROGRESS NOTE   CURRENT DIAGNOSES: 1. Abdominal pain, nausea, and vomiting, presumed secondary to     colitis, likely infections, as per CT scan. 2. Gastroesophageal reflux disease. 3  Anxiety disorder. 1. Depression 2. Deconditioning. 3. Papular rash of lower back and buttocks.  HOSPITAL COURSE: Up-to-date.  Mrs. Ouellet is a pleasant 77 year old Caucasian lady who lives alone and has a significant component of anxiety, who presented to the hospital with complaints of nausea, vomiting, and upper abdominal pain for about 24 hours.  In the emergency department, they did a CT scan of the abdomen and pelvis that showed evidence for segmental colitis involving the transverse colon.  Given her CIPRO allergy, she was started on IV Zosyn and we were asked to admit her for further evaluation.  Throughout her hospitalization, Mrs. Lean has been very slow to improve.  She is now tolerating a clear liquid diet with sometimes soft foods such as mashed potatoes.  We have transitioned her IV Zosyn over to the Flagyl and cefixime which she will complete a 14- day course and it is scheduled to be finished on March 4.  She has also had some severe deconditioning and we have had PT and OT follow her. They are recommending that she will need home health services, but hey will continue to follow her acutely for further recommendations.  Today, Mrs. Fraga has pointed out a papular rash to her lower back and buttocks.  She appears to have some white exudates in between the buttocks as well.  This is most consistent with a yeast infection.  I will go ahead and start her on fluconazole 100 mg daily to take for 3 days.  I will  also give her Benadryl p.o. and topical to help with the pruritus.  Today, she also tells me that she has had some slight increase in abdominal pain with nausea, so we will proceed with ordering an acute abdominal series.  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  Further recommendations will ensue as her hospital course progresses.     Peggye Pitt, M.D.     EH/MEDQ  D:  06/08/2010  T:  06/08/2010  Job:  [id number]  Electronically Signed by Peggye Pitt M.D. on 07/15/2010 07:58:09 AM

## 2010-07-19 ENCOUNTER — Inpatient Hospital Stay (HOSPITAL_COMMUNITY)
Admission: EM | Admit: 2010-07-19 | Discharge: 2010-08-02 | DRG: 393 | Disposition: A | Discharge status: Skilled Nursing Facility | Payer: Medicare Other | Attending: Internal Medicine | Admitting: Internal Medicine

## 2010-07-19 ENCOUNTER — Emergency Department (HOSPITAL_COMMUNITY): Payer: Medicare Other

## 2010-07-19 DIAGNOSIS — G8929 Other chronic pain: Secondary | ICD-10-CM | POA: Diagnosis present

## 2010-07-19 DIAGNOSIS — E871 Hypo-osmolality and hyponatremia: Secondary | ICD-10-CM | POA: Diagnosis not present

## 2010-07-19 DIAGNOSIS — D72829 Elevated white blood cell count, unspecified: Secondary | ICD-10-CM | POA: Diagnosis present

## 2010-07-19 DIAGNOSIS — R000 Tachycardia, unspecified: Secondary | ICD-10-CM | POA: Diagnosis present

## 2010-07-19 DIAGNOSIS — E873 Alkalosis: Secondary | ICD-10-CM | POA: Diagnosis present

## 2010-07-19 DIAGNOSIS — K559 Vascular disorder of intestine, unspecified: Principal | ICD-10-CM | POA: Diagnosis present

## 2010-07-19 DIAGNOSIS — F341 Dysthymic disorder: Secondary | ICD-10-CM | POA: Diagnosis present

## 2010-07-19 DIAGNOSIS — D638 Anemia in other chronic diseases classified elsewhere: Secondary | ICD-10-CM | POA: Diagnosis present

## 2010-07-19 DIAGNOSIS — D47Z9 Other specified neoplasms of uncertain behavior of lymphoid, hematopoietic and related tissue: Secondary | ICD-10-CM | POA: Diagnosis present

## 2010-07-19 DIAGNOSIS — Z8249 Family history of ischemic heart disease and other diseases of the circulatory system: Secondary | ICD-10-CM

## 2010-07-19 DIAGNOSIS — M545 Low back pain, unspecified: Secondary | ICD-10-CM | POA: Diagnosis present

## 2010-07-19 DIAGNOSIS — D7389 Other diseases of spleen: Secondary | ICD-10-CM | POA: Diagnosis present

## 2010-07-19 DIAGNOSIS — J189 Pneumonia, unspecified organism: Secondary | ICD-10-CM | POA: Diagnosis present

## 2010-07-19 DIAGNOSIS — K449 Diaphragmatic hernia without obstruction or gangrene: Secondary | ICD-10-CM | POA: Diagnosis present

## 2010-07-19 DIAGNOSIS — D473 Essential (hemorrhagic) thrombocythemia: Secondary | ICD-10-CM | POA: Diagnosis present

## 2010-07-19 DIAGNOSIS — D126 Benign neoplasm of colon, unspecified: Secondary | ICD-10-CM | POA: Diagnosis present

## 2010-07-19 DIAGNOSIS — K219 Gastro-esophageal reflux disease without esophagitis: Secondary | ICD-10-CM | POA: Diagnosis present

## 2010-07-19 DIAGNOSIS — I82629 Acute embolism and thrombosis of deep veins of unspecified upper extremity: Secondary | ICD-10-CM | POA: Diagnosis not present

## 2010-07-19 LAB — URINE MICROSCOPIC-ADD ON

## 2010-07-19 LAB — CBC
MCV: 76.3 fL — ABNORMAL LOW (ref 78.0–100.0)
Platelets: 807 10*3/uL — ABNORMAL HIGH (ref 150–400)
RDW: 17.3 % — ABNORMAL HIGH (ref 11.5–15.5)
WBC: 15.7 10*3/uL — ABNORMAL HIGH (ref 4.0–10.5)

## 2010-07-19 LAB — URINALYSIS, ROUTINE W REFLEX MICROSCOPIC
Glucose, UA: NEGATIVE mg/dL
Hgb urine dipstick: NEGATIVE
Protein, ur: >300 mg/dL — AB
Specific Gravity, Urine: 1.028 (ref 1.005–1.030)
pH: 7.5 (ref 5.0–8.0)

## 2010-07-19 LAB — COMPREHENSIVE METABOLIC PANEL
Albumin: 3 g/dL — ABNORMAL LOW (ref 3.5–5.2)
Alkaline Phosphatase: 52 U/L (ref 39–117)
BUN: 20 mg/dL (ref 6–23)
Potassium: 4.8 mEq/L (ref 3.5–5.1)
Total Protein: 7.2 g/dL (ref 6.0–8.3)

## 2010-07-19 LAB — DIFFERENTIAL
Basophils Absolute: 0.1 10*3/uL (ref 0.0–0.1)
Eosinophils Absolute: 0 10*3/uL (ref 0.0–0.7)
Eosinophils Relative: 0 % (ref 0–5)
Lymphs Abs: 2.4 10*3/uL (ref 0.7–4.0)

## 2010-07-20 ENCOUNTER — Inpatient Hospital Stay (HOSPITAL_COMMUNITY): Payer: Medicare Other

## 2010-07-20 LAB — GLUCOSE, CAPILLARY
Glucose-Capillary: 123 mg/dL — ABNORMAL HIGH (ref 70–99)
Glucose-Capillary: 124 mg/dL — ABNORMAL HIGH (ref 70–99)

## 2010-07-20 LAB — TYPE AND SCREEN
ABO/RH(D): O POS
Antibody Screen: NEGATIVE

## 2010-07-20 LAB — COMPREHENSIVE METABOLIC PANEL
BUN: 17 mg/dL (ref 6–23)
CO2: 24 mEq/L (ref 19–32)
Chloride: 107 mEq/L (ref 96–112)
Creatinine, Ser: 0.71 mg/dL (ref 0.4–1.2)
GFR calc non Af Amer: >60 mL/min (ref >60)
Total Bilirubin: 0.3 mg/dL (ref 0.3–1.2)

## 2010-07-20 LAB — CBC
HCT: 29.2 % — ABNORMAL LOW (ref 36.0–46.0)
Hemoglobin: 8.6 g/dL — ABNORMAL LOW (ref 12.0–15.0)
MCH: 22.4 pg — ABNORMAL LOW (ref 26.0–34.0)
MCHC: 29.5 g/dL — ABNORMAL LOW (ref 30.0–36.0)
MCV: 76 fL — ABNORMAL LOW (ref 78.0–100.0)
RBC: 3.84 MIL/uL — ABNORMAL LOW (ref 3.87–5.11)
RDW: 17.4 % — ABNORMAL HIGH (ref 11.5–15.5)
WBC: 10.5 10*3/uL (ref 4.0–10.5)

## 2010-07-20 LAB — PROCALCITONIN: Procalcitonin: <0.1 ng/mL

## 2010-07-20 LAB — PROLACTIN: Prolactin: 7.8 ng/mL

## 2010-07-20 LAB — LACTIC ACID, PLASMA: Lactic Acid, Venous: 1.4 mmol/L (ref 0.5–2.2)

## 2010-07-20 MED ORDER — IOHEXOL 300 MG/ML  SOLN
100.0000 mL | Freq: Once | INTRAMUSCULAR | Status: AC | PRN
  Administered 2010-07-20: 100 mL via INTRAVENOUS

## 2010-07-21 LAB — COMPREHENSIVE METABOLIC PANEL
ALT: 10 U/L (ref 0–35)
AST: 25 U/L (ref 0–37)
Albumin: 2.7 g/dL — ABNORMAL LOW (ref 3.5–5.2)
Alkaline Phosphatase: 46 U/L (ref 39–117)
BUN: 11 mg/dL (ref 6–23)
Chloride: 108 mEq/L (ref 96–112)
Potassium: 3.6 mEq/L (ref 3.5–5.1)
Sodium: 138 mEq/L (ref 135–145)
Total Bilirubin: 0.3 mg/dL (ref 0.3–1.2)

## 2010-07-21 LAB — CBC
HCT: 31.3 % — ABNORMAL LOW (ref 36.0–46.0)
MCV: 75.6 fL — ABNORMAL LOW (ref 78.0–100.0)
Platelets: 617 10*3/uL — ABNORMAL HIGH (ref 150–400)
RBC: 4.14 MIL/uL (ref 3.87–5.11)
WBC: 9.4 10*3/uL (ref 4.0–10.5)

## 2010-07-21 LAB — GLUCOSE, CAPILLARY
Glucose-Capillary: 132 mg/dL — ABNORMAL HIGH (ref 70–99)
Glucose-Capillary: 140 mg/dL — ABNORMAL HIGH (ref 70–99)
Glucose-Capillary: 145 mg/dL — ABNORMAL HIGH (ref 70–99)

## 2010-07-21 LAB — GIARDIA/CRYPTOSPORIDIUM SCREEN(EIA)
Cryptosporidium Screen (EIA): NEGATIVE
Giardia Screen - EIA: NEGATIVE

## 2010-07-22 ENCOUNTER — Other Ambulatory Visit: Payer: Self-pay | Admitting: Gastroenterology

## 2010-07-22 LAB — COMPREHENSIVE METABOLIC PANEL
ALT: 14 U/L (ref 0–35)
AST: 25 U/L (ref 0–37)
Albumin: 2.7 g/dL — ABNORMAL LOW (ref 3.5–5.2)
Alkaline Phosphatase: 46 U/L (ref 39–117)
Chloride: 107 mEq/L (ref 96–112)
GFR calc Af Amer: >60 mL/min (ref >60)
Potassium: 2.6 mEq/L — CL (ref 3.5–5.1)
Sodium: 138 mEq/L (ref 135–145)
Total Protein: 6.7 g/dL (ref 6.0–8.3)

## 2010-07-22 LAB — CBC
Platelets: 860 10*3/uL — ABNORMAL HIGH (ref 150–400)
RBC: 4.45 MIL/uL (ref 3.87–5.11)
RDW: 17.4 % — ABNORMAL HIGH (ref 11.5–15.5)
WBC: 16.6 10*3/uL — ABNORMAL HIGH (ref 4.0–10.5)

## 2010-07-22 LAB — GLUCOSE, CAPILLARY
Glucose-Capillary: 155 mg/dL — ABNORMAL HIGH (ref 70–99)
Glucose-Capillary: 150 mg/dL — ABNORMAL HIGH (ref 70–99)
Glucose-Capillary: 159 mg/dL — ABNORMAL HIGH (ref 70–99)

## 2010-07-23 ENCOUNTER — Inpatient Hospital Stay (HOSPITAL_COMMUNITY): Payer: Medicare Other

## 2010-07-23 LAB — CBC
MCH: 22.7 pg — ABNORMAL LOW (ref 26.0–34.0)
MCHC: 29.8 g/dL — ABNORMAL LOW (ref 30.0–36.0)
Platelets: 616 10*3/uL — ABNORMAL HIGH (ref 150–400)
RBC: 4.71 MIL/uL (ref 3.87–5.11)

## 2010-07-23 LAB — GLUCOSE, CAPILLARY
Glucose-Capillary: 167 mg/dL — ABNORMAL HIGH (ref 70–99)
Glucose-Capillary: 153 mg/dL — ABNORMAL HIGH (ref 70–99)
Glucose-Capillary: 184 mg/dL — ABNORMAL HIGH (ref 70–99)

## 2010-07-23 LAB — COMPREHENSIVE METABOLIC PANEL
AST: 27 U/L (ref 0–37)
Albumin: 2.5 g/dL — ABNORMAL LOW (ref 3.5–5.2)
Calcium: 8 mg/dL — ABNORMAL LOW (ref 8.4–10.5)
Chloride: 108 mEq/L (ref 96–112)
Creatinine, Ser: 0.53 mg/dL (ref 0.4–1.2)
GFR calc Af Amer: >60 mL/min (ref >60)

## 2010-07-24 LAB — COMPREHENSIVE METABOLIC PANEL
AST: 20 U/L (ref 0–37)
Albumin: 2.1 g/dL — ABNORMAL LOW (ref 3.5–5.2)
Alkaline Phosphatase: 47 U/L (ref 39–117)
Chloride: 109 mEq/L (ref 96–112)
GFR calc Af Amer: >60 mL/min (ref >60)
Potassium: 3.5 mEq/L (ref 3.5–5.1)
Sodium: 135 mEq/L (ref 135–145)
Total Bilirubin: 0.5 mg/dL (ref 0.3–1.2)

## 2010-07-24 LAB — CBC
Platelets: 595 10*3/uL — ABNORMAL HIGH (ref 150–400)
RBC: 4.32 MIL/uL (ref 3.87–5.11)
WBC: 24.7 10*3/uL — ABNORMAL HIGH (ref 4.0–10.5)

## 2010-07-24 LAB — STOOL CULTURE

## 2010-07-24 LAB — GLUCOSE, CAPILLARY: Glucose-Capillary: 153 mg/dL — ABNORMAL HIGH (ref 70–99)

## 2010-07-25 ENCOUNTER — Inpatient Hospital Stay (HOSPITAL_COMMUNITY): Payer: Medicare Other

## 2010-07-25 LAB — COMPREHENSIVE METABOLIC PANEL
ALT: 9 U/L (ref 0–35)
AST: 17 U/L (ref 0–37)
Albumin: 2 g/dL — ABNORMAL LOW (ref 3.5–5.2)
Alkaline Phosphatase: 45 U/L (ref 39–117)
CO2: 17 mEq/L — ABNORMAL LOW (ref 19–32)
Chloride: 108 mEq/L (ref 96–112)
Creatinine, Ser: 0.44 mg/dL (ref 0.4–1.2)
GFR calc Af Amer: >60 mL/min (ref >60)
GFR calc non Af Amer: >60 mL/min (ref >60)
Potassium: 4.5 mEq/L (ref 3.5–5.1)
Total Bilirubin: 0.5 mg/dL (ref 0.3–1.2)

## 2010-07-25 LAB — DIFFERENTIAL
Basophils Relative: 0 % (ref 0–1)
Lymphs Abs: 2.1 10*3/uL (ref 0.7–4.0)
Monocytes Relative: 8 % (ref 3–12)
Neutro Abs: 17.2 10*3/uL — ABNORMAL HIGH (ref 1.7–7.7)
Neutrophils Relative %: 81 % — ABNORMAL HIGH (ref 43–77)

## 2010-07-25 LAB — CBC
Hemoglobin: 9.4 g/dL — ABNORMAL LOW (ref 12.0–15.0)
MCH: 22.9 pg — ABNORMAL LOW (ref 26.0–34.0)
RBC: 4.11 MIL/uL (ref 3.87–5.11)
WBC: 21.2 10*3/uL — ABNORMAL HIGH (ref 4.0–10.5)

## 2010-07-25 MED ORDER — IOHEXOL 300 MG/ML  SOLN
100.0000 mL | Freq: Once | INTRAMUSCULAR | Status: AC | PRN
  Administered 2010-07-25: 100 mL via INTRAVENOUS

## 2010-07-26 LAB — CBC
HCT: 28.5 % — ABNORMAL LOW (ref 36.0–46.0)
MCV: 75.6 fL — ABNORMAL LOW (ref 78.0–100.0)
RBC: 3.77 MIL/uL — ABNORMAL LOW (ref 3.87–5.11)
RDW: 17.6 % — ABNORMAL HIGH (ref 11.5–15.5)
WBC: 14.2 10*3/uL — ABNORMAL HIGH (ref 4.0–10.5)

## 2010-07-26 LAB — BLOOD GAS, ARTERIAL
Bicarbonate: 19.9 mEq/L — ABNORMAL LOW (ref 20.0–24.0)
FIO2: 0.21 %
pCO2 arterial: 30.7 mmHg — ABNORMAL LOW (ref 35.0–45.0)
pH, Arterial: 7.428 — ABNORMAL HIGH (ref 7.350–7.400)
pO2, Arterial: 178 mmHg — ABNORMAL HIGH (ref 80.0–100.0)

## 2010-07-26 LAB — COMPREHENSIVE METABOLIC PANEL
ALT: 9 U/L (ref 0–35)
Albumin: 1.9 g/dL — ABNORMAL LOW (ref 3.5–5.2)
Alkaline Phosphatase: 52 U/L (ref 39–117)
BUN: 13 mg/dL (ref 6–23)
Chloride: 102 mEq/L (ref 96–112)
Glucose, Bld: 88 mg/dL (ref 70–99)
Potassium: 3.2 mEq/L — ABNORMAL LOW (ref 3.5–5.1)
Sodium: 132 mEq/L — ABNORMAL LOW (ref 135–145)
Total Bilirubin: 1 mg/dL (ref 0.3–1.2)
Total Protein: 5.6 g/dL — ABNORMAL LOW (ref 6.0–8.3)

## 2010-07-26 LAB — GLUCOSE, CAPILLARY
Glucose-Capillary: 103 mg/dL — ABNORMAL HIGH (ref 70–99)
Glucose-Capillary: 105 mg/dL — ABNORMAL HIGH (ref 70–99)
Glucose-Capillary: 106 mg/dL — ABNORMAL HIGH (ref 70–99)
Glucose-Capillary: 94 mg/dL (ref 70–99)

## 2010-07-26 LAB — ANTI-NUCLEAR AB-TITER (ANA TITER)

## 2010-07-26 LAB — ANTI-DNA ANTIBODY, DOUBLE-STRANDED: ds DNA Ab: 3 IU/mL (ref <30)

## 2010-07-27 LAB — BASIC METABOLIC PANEL
Calcium: 8.1 mg/dL — ABNORMAL LOW (ref 8.4–10.5)
GFR calc Af Amer: >60 mL/min (ref >60)
GFR calc non Af Amer: >60 mL/min (ref >60)
Potassium: 3.1 mEq/L — ABNORMAL LOW (ref 3.5–5.1)
Sodium: 137 mEq/L (ref 135–145)

## 2010-07-27 LAB — CBC
HCT: 28.1 % — ABNORMAL LOW (ref 36.0–46.0)
MCHC: 30.2 g/dL (ref 30.0–36.0)
RDW: 17.8 % — ABNORMAL HIGH (ref 11.5–15.5)
WBC: 11.8 10*3/uL — ABNORMAL HIGH (ref 4.0–10.5)

## 2010-07-27 LAB — CULTURE, BLOOD (SINGLE)
Culture  Setup Time: 201204040907
Culture: NO GROWTH
Culture: NO GROWTH

## 2010-07-28 ENCOUNTER — Inpatient Hospital Stay (HOSPITAL_COMMUNITY): Payer: Medicare Other

## 2010-07-28 DIAGNOSIS — M7989 Other specified soft tissue disorders: Secondary | ICD-10-CM

## 2010-07-28 LAB — CBC
HCT: 29.2 % — ABNORMAL LOW (ref 36.0–46.0)
Hemoglobin: 8.7 g/dL — ABNORMAL LOW (ref 12.0–15.0)
RBC: 3.91 MIL/uL (ref 3.87–5.11)
WBC: 12.8 10*3/uL — ABNORMAL HIGH (ref 4.0–10.5)

## 2010-07-28 LAB — URINALYSIS, ROUTINE W REFLEX MICROSCOPIC
Glucose, UA: NEGATIVE mg/dL
Ketones, ur: 15 mg/dL — AB
Nitrite: NEGATIVE
Specific Gravity, Urine: 1.02 (ref 1.005–1.030)
pH: 6.5 (ref 5.0–8.0)

## 2010-07-28 LAB — APTT: aPTT: 93 seconds — ABNORMAL HIGH (ref 24–37)

## 2010-07-28 LAB — URINE MICROSCOPIC-ADD ON

## 2010-07-29 LAB — LUPUS ANTICOAGULANT PANEL
PTT Lupus Anticoagulant: 33.2 secs (ref 30.0–45.6)
dRVVT Incubated 1:1 Mix: 39.5 secs (ref 36.2–44.3)

## 2010-07-29 LAB — BETA-2-GLYCOPROTEIN I ABS, IGG/M/A
Beta-2 Glyco I IgG: 1 G Units (ref <20)
Beta-2-Glycoprotein I IgA: 2 A Units (ref <20)
Beta-2-Glycoprotein I IgM: 0 M Units (ref <20)

## 2010-07-29 LAB — CBC
HCT: 27.7 % — ABNORMAL LOW (ref 36.0–46.0)
MCH: 22.2 pg — ABNORMAL LOW (ref 26.0–34.0)
MCV: 75.1 fL — ABNORMAL LOW (ref 78.0–100.0)
Platelets: 487 10*3/uL — ABNORMAL HIGH (ref 150–400)
RDW: 17.9 % — ABNORMAL HIGH (ref 11.5–15.5)
WBC: 9.7 10*3/uL (ref 4.0–10.5)

## 2010-07-29 LAB — CARDIOLIPIN ANTIBODIES, IGG, IGM, IGA: Anticardiolipin IgG: 9 GPL U/mL — ABNORMAL LOW (ref <23)

## 2010-07-29 LAB — PROTIME-INR
INR: 1.4 (ref 0.00–1.49)
Prothrombin Time: 17.4 seconds — ABNORMAL HIGH (ref 11.6–15.2)

## 2010-07-29 LAB — ANTITHROMBIN III: AntiThromb III Func: 87 % (ref 76–126)

## 2010-07-29 LAB — PROTEIN C ACTIVITY: Protein C Activity: 41 % — ABNORMAL LOW (ref 75–133)

## 2010-07-29 LAB — PROTEIN C, TOTAL: Protein C, Total: 60 % — ABNORMAL LOW (ref 72–160)

## 2010-07-30 DIAGNOSIS — D649 Anemia, unspecified: Secondary | ICD-10-CM

## 2010-07-30 DIAGNOSIS — I809 Phlebitis and thrombophlebitis of unspecified site: Secondary | ICD-10-CM

## 2010-07-30 LAB — CBC
HCT: 29.5 % — ABNORMAL LOW (ref 36.0–46.0)
MCH: 22.6 pg — ABNORMAL LOW (ref 26.0–34.0)
MCV: 76.6 fL — ABNORMAL LOW (ref 78.0–100.0)
RBC: 3.85 MIL/uL — ABNORMAL LOW (ref 3.87–5.11)
WBC: 10.2 10*3/uL (ref 4.0–10.5)

## 2010-07-30 LAB — BASIC METABOLIC PANEL
Chloride: 102 mEq/L (ref 96–112)
GFR calc Af Amer: >60 mL/min (ref >60)
Potassium: 3.3 mEq/L — ABNORMAL LOW (ref 3.5–5.1)

## 2010-07-30 LAB — PROTHROMBIN GENE MUTATION

## 2010-07-30 LAB — HEPARIN LEVEL (UNFRACTIONATED): Heparin Unfractionated: 0.2 IU/mL — ABNORMAL LOW (ref 0.30–0.70)

## 2010-07-31 LAB — CBC
HCT: 29.2 % — ABNORMAL LOW (ref 36.0–46.0)
Hemoglobin: 8.3 g/dL — ABNORMAL LOW (ref 12.0–15.0)
RBC: 3.79 MIL/uL — ABNORMAL LOW (ref 3.87–5.11)
RDW: 17.9 % — ABNORMAL HIGH (ref 11.5–15.5)
WBC: 7.7 10*3/uL (ref 4.0–10.5)

## 2010-07-31 LAB — PROTIME-INR
INR: 1.47 (ref 0.00–1.49)
Prothrombin Time: 18 seconds — ABNORMAL HIGH (ref 11.6–15.2)

## 2010-07-31 NOTE — Progress Notes (Signed)
NAMEHILLARI, [name]                ACCOUNT NO.:  [id number]  MEDICAL RECORD NO.:  [id number]           PATIENT TYPE:  I  LOCATION:  3728                         FACILITY:  [facility]  PHYSICIAN:  Pleas Koch, MD        DATE OF BIRTH:  03-23-1933                                PROGRESS NOTE   Diagnoses up-to-date are as follows: 1. Indeterminate colitis. 2. Colon polyp with tubular adenoma x3 negative for high-grade     dysplasia. 3. Splenic infarct, query cause. 4. Leukocytosis, unknown cause. 5. Metabolic alkalosis, likely secondary to compensation for     tachypnea. 6. Chronic low back pain. 7. Depression.  Medications up-to-date at this point are: 1. Antiseptic rinse to mouth 15 mg q.12. 2. Chlorhexidine liquid 15 mg q.8 a.m., 8 p.m. b.i.d. 3. Lasix. 4. Methadone 15 mg daily. 5. Metoprolol 25 mg b.i.d. 6. Pantoprazole 80 mg q.12 hourly. 7. Oxycodone 40 mg q.8 hourly. 8. Sertraline 100 mg daily. 9. Hydrocodone/APAP 1 tab q.6 p.r.n. 10.Loperamide 2 mg p.r.n., can repeat one dose q.1 hourly. 11.Compazine 5 mg q.6 p.r.n. 12.Promethazine 25 mg q.4 p.r.n.  Pertinent radiological findings are as follows: 1. Acute abdominal series, July 19, 2010, showing improving left lung     base pneumonia.  No new consolidation.  Nonobstructive bowel     pattern. 2. CT abdomen and pelvis with contrast dated July 20, 2010, showing     attenuation abnormality involving spleen, suspicious for infarct.     Alternatively, there was a recent trauma, this could represent a     spleen laceration. 3. Mild wall thickening involving the distal descending colon, sigmoid     colon.  There was very slight incomplete distention.  Mild     segmental colitis, now improved. 4. Small left effusion.  There was also some mild bilateral lower lobe     ground-glass attenuation which may represent edema or nonspecific     alveolitis. 5. KUB x-ray showed extremely limited study due to limited patient      mobility. 6. Flash laryngeal penetration. 7. Moderate-sized hiatal hernia with spontaneous gastroesophageal     reflux. 8. Featureless gastric mucosa suspicious for atrophic gastritis. 9. CT abdomen with contrast, July 25, 2010, showed multifactorial     deprivation including motion dense contrast involving some  recent     fluoroscopy, similar appearance of spleen infarct, increased     bilateral pleural effusions, moderate hiatal hernia.  PERTINENT CONSULTS: 1. Shirley Friar, MD, of [facility]. 2. I verbalized with Azzie Roup the patient's course of care and     also spoke with surgeon, Dr. Carolynne Edouard about the patient's care.  Briefly, this is a pleasant 77 year old female recently discharged from the hospital on June 21, 2010, with presumed colitis.  She had followed up with Dr. Madilyn Fireman and was scheduled for an outpatient colonoscopy, endoscopy, however, before she could return to them developed similar symptoms.  She started having occasional diarrhea, but has been having constant diffuse abdominal pain, nausea, and vomiting.  She stated that the abdominal pain was colicky.  Multiple episodes of nausea, vomiting.  Denies blood in the vomitus and states she has had multiple episodes of diarrhea which are of dark color.  She also had subjective feeling of fever and chills and denied chest pain, shortness of breath, any dizziness, loss of consciousness, focal deficit.  The patient carries a strong history of chronic back pain and anxiety as well.  Vitals on admission, blood pressure 110/60, pulse 100-110 per minute, temperature 98.5, respirations 18 per minute, O2 sats 93%.  Admission WBC 15.7, hemoglobin 10.2, BUN 27.7, creatinine was 0.72.  HOSPITAL COURSE: 1. The patient was found to have nausea, vomiting, diarrhea, and     thought to have infectious versus autoimmune versus opiate     withdrawal colitis.  This would not explain her nausea and     vomiting.  I spoke with Dr.  Bosie Clos to help with this and he found     3 separate polyps in the colon, which were excised and found to be     tubular adenomas without any high-grade malignancy.  This may     warrant followup in 3-5 years for screening colonoscopy again. 2. Colitis.  It is unclear to me at this point in time whether this is     microscopic colitis as unfortunately she did not have a biopsy.  I     discussed her case with Dr. Azzie Roup of GI who recommended     that this could likely be secondary to stress.  His cortisol might     explain the high demargination white count and high platelet count     which are seemingly resolving at this point in time. 3. Splenic infarct.  The patient had a CT scan, which showed     incidental possible infarct.  I discussed this with surgeon who     recommended that this is possibly not an acute surgical issue as     the patient is not having any significant pain and the patient is     not having any significant pain at this time.  Repeat CT scan     showed stable appearance of the infarct and as such we will     continue to monitor. 4. Anemia.  Her baselines are 9-10 and she apparently sees Dr.     Truett Perna for this and verbalizes to me that her hemoglobin was low     at 6 in the past.  However, I am not able to locate those records.     She was typed and screened, but never need a transfusion.  Her     hemoglobin has been stable till date while at the hospital.  If she     drops below 8.0 she may warrant transfusion; however, is stable     today at 8.5 on date July 27, 2010. 5. History of depression and chronic low back pain.  The patient is on     Zoloft and will continue this.  The patient is also on Compazine.     It is interesting to know that when I re-implemented her Compazine,     her Zoloft, and her pain medication, her diarrhea seemed to slowly     resolve.  The patient would likely benefit from a psychiatric     appointment as an outpatient. 6.  Metabolic alkalosis likely secondary to compensation for tachypnea.     The patient was kept on IV fluids and had transient episodes of     drop in her bicarb to 17.  It was noted that she was on IV fluids     and these were discontinued and she was given IV Lasix and as such     her bicarb has jumped back up to 24.  She is still mildly     hypokalemic and this will be replaced.  DISPOSITION: It is unclear to me what the actual cause of her issues are and the patient is actually asking today, July 27, 2010, for soft diet.  The patient has only been on clear liquids since her admission here.  The patient was doing well today.  Her vitals were temperature 97.4, pulse rate of 119 (she is a baseline tachycardic), respirations 22, blood pressure 107-154/67.  The patient was alert, oriented, did not seem to be in any acute distress.  Abdomen soft, nontender, nondistended.  The patient had one episode of diarrhea today so far, no nausea, and tolerated her Jell-O okay this morning with just a little bit of nausea.  The patient seems hemodynamically stable and can be discharged once she tolerates a full diet.  In view of her chronic pain, I have trialed her on methadone 15 mg and this may need to be up-titrated.  I have discontinued a bunch of medications that I do not feel are necessary at this point in time and the patient may be discharged once again when tolerating a full diet.  It was a pleasure taking care of this patient.  I will verbalize with her son, Zameria Vogl, at phone number [phone] today and update him about her care.          ______________________________ Pleas Koch, MD     JS/MEDQ  D:  07/27/2010  T:  07/28/2010  Job:  [id number]  Electronically Signed by Pleas Koch MD on 07/31/2010 07:35:38 AM

## 2010-08-01 DIAGNOSIS — I809 Phlebitis and thrombophlebitis of unspecified site: Secondary | ICD-10-CM

## 2010-08-01 DIAGNOSIS — D649 Anemia, unspecified: Secondary | ICD-10-CM

## 2010-08-01 LAB — GLUCOSE, CAPILLARY
Glucose-Capillary: 108 mg/dL — ABNORMAL HIGH (ref 70–99)
Glucose-Capillary: 138 mg/dL — ABNORMAL HIGH (ref 70–99)
Glucose-Capillary: 141 mg/dL — ABNORMAL HIGH (ref 70–99)
Glucose-Capillary: 144 mg/dL — ABNORMAL HIGH (ref 70–99)

## 2010-08-01 LAB — CBC
HCT: 28.5 % — ABNORMAL LOW (ref 36.0–46.0)
MCHC: 28.4 g/dL — ABNORMAL LOW (ref 30.0–36.0)
MCV: 76.2 fL — ABNORMAL LOW (ref 78.0–100.0)
RDW: 17.9 % — ABNORMAL HIGH (ref 11.5–15.5)

## 2010-08-02 LAB — CBC
MCH: 22.5 pg — ABNORMAL LOW (ref 26.0–34.0)
MCHC: 29.4 g/dL — ABNORMAL LOW (ref 30.0–36.0)
Platelets: 737 10*3/uL — ABNORMAL HIGH (ref 150–400)

## 2010-08-02 LAB — GLUCOSE, CAPILLARY: Glucose-Capillary: 103 mg/dL — ABNORMAL HIGH (ref 70–99)

## 2010-08-02 LAB — PROTIME-INR: Prothrombin Time: 20.9 seconds — ABNORMAL HIGH (ref 11.6–15.2)

## 2010-08-02 NOTE — Discharge Summary (Signed)
[name], [name]                ACCOUNT NO.:  [id number]  MEDICAL RECORD NO.:  [id number]           PATIENT TYPE:  I  LOCATION:  5530                         FACILITY:  [facility]  PHYSICIAN:  Marinda Elk, M.D.DATE OF BIRTH:  Feb 09, 1933  DATE OF ADMISSION:  07/19/2010 DATE OF DISCHARGE:                        DISCHARGE SUMMARY - REFERRING   PRIMARY CARE PROVIDER:  Dr. Deboraha Sprang at [place].  DISCHARGE DIAGNOSES: 1. Left upper extremity deep vein thrombosis. 2. Chronic thrombocytosis, probably secondary to myeloproliferative     disorder. 3. Splenic infarct. 4. Anemia. 5. Tachycardia. 6. Ischemic colitis. 7. Leukocytosis/thrombocytosis.  DISCHARGE MEDICATIONS:  Please refer to med reconciliation sheet for further details.  CONSULTANTS:  Hematology, G. Rolm Baptise, MD and Shirley Friar, MD, GI.  PROCEDURES PERFORMED: 1. Chest x-ray showed bilateral pleural effusion, mild pulmonary     vascular congestion on July 28, 2010. 2. July 25, 2010, CT scan of the abdomen showed multifactorial     degenerative conditions and dense contrast within the bowel from     recent fluoroscopy. 3. Significant splenic infarcts. 4. Increased bilateral pleural effusion. 5. Moderate hiatal hernia. 6. KUB July 23, 2010, showed extremely limited study due to the     patient's mobility/laryngeal penetration, moderate size hiatal     hernia, spontaneous GERD, featureless gastric mucosa suspicious for     atrophic gastritis. 7. July 30, 2010, CT scan of the abdomen and pelvis showed     attenuation involving the spleen, is suspicious for infarct,     alternative if there is a history of trauma, could represent     laceration. 8. There is a mild wall thickening of the distal descending colon and     sigmoid, this may reflect incomplete distention. 9. Small left effusion. 10.July 19, 2010, acute abdominal series showed improving left lung     pneumonia, no new  consolidations.  BRIEF ADMITTING HISTORY AND PHYSICAL:  This is a 77 year old female with past medical history for diarrhea, nausea, vomiting, and similar complaints.  The patient stated that she was doing fine after she got discharge, but she did have an occasional diarrhea episode for the last 2 days.  She has been in constant diarrhea with diffuse abdominal pain, nausea, and vomiting.  In the ER, the patient had acute series which did not show anything acute.  The patient has leukocytosis and will be admitted for further workup.  Please refer to dictation from July 30, 2010, for further details. 1. Upper extremity DVT.  She started having left arm swelling.  She     had several iv line.  Doppler of extremity was     done, is positive for DVT.  She was started on IV heparin.  It was     discussed with Dr. Truett Perna, her hematologist, the concern for     myeloproliferative disorder causing clots, he agreed.  He came in     to see her, recommended to continue her on Lovenox as she will be     hard to control on Coumadin.  So, she will go home on Lovenox for 3-  6 months and he will follow up on her. 2. Chronic thrombocytosis, possibly secondary to myeloproliferative     disorder.  At this time, she was not started on hydroxyurea.  Her     platelets have been jumping up and down.  Dr. Truett Perna states he     will follow up on her as an outpatient and start her on hydroxyurea     as needed.  Her platelets in the hospital has been  700 but it comes      down by itself.  We will continue to monitor. 3. Splenic infarct, currently asymptomatic.  This might be related to     her myeloproliferative disorder.  She does have a DVT.  At this     time, she is currently asymptomatic and we will monitor. 4. Anemia, probably secondary to chronic disease secondary to     myeloproliferative disorder.  Her hemoglobin remained stable.  She     will be sent out on ferrous sulfate t.i.d. 5. Ischemic  colitis.  GI was consulted, Dr. Bosie Clos.  He did a     colonoscopy that showed polyps the largest being pedunculated.     Pathology showed negative for high-grade dysplasia or malignancy.     We will follow up with GI as an outpatient.  He also did an EGD     that showed question of an upper extremity stricture.  At this     time, she is having no complaints of dysphagia.  She will follow up     as an outpatient. 6. Leukocytosis that is probably secondary to combination of possible     myeloproliferative disorder versus  DVT and/or  when she develops mild     infection but this gets down without any antibiotics.  She remained     afebrile.  So, she will follow up with her primary care doctor and     her hematologist as needed.  VITAL SIGNS:  On day of discharge; temperature 97, pulse was 82, respirations 19, and blood pressure 126/73.  She was satting 95% on room air.  LABORATORY DATA:  On day of discharge shows a white count of 7.8, hemoglobin of 8.5, and platelet count of 737.     Marinda Elk, M.D.     AF/MEDQ  D:  08/02/2010  T:  08/02/2010  Job:  [id number]  cc:   Quenton Fetter, M.D.  Electronically Signed by Lambert Keto M.D. on 08/02/2010 03:20:26 PM

## 2010-08-04 LAB — CULTURE, BLOOD (ROUTINE X 2)
Culture  Setup Time: 201204112359
Culture: NO GROWTH

## 2010-08-08 NOTE — Op Note (Signed)
  [name], [name]                ACCOUNT NO.:  [id number]  MEDICAL RECORD NO.:  [id number]           PATIENT TYPE:  LOCATION:                                 FACILITY:  PHYSICIAN:  Shirley Friar, MDDATE OF BIRTH:  10/01/1932  DATE OF PROCEDURE: DATE OF DISCHARGE:                              OPERATIVE REPORT   INDICATIONS:  Diarrhea, abdominal pain, nausea, and vomiting.  MEDICATIONS:  Fentanyl 125 mcg IV and Versed 12.5 mg IV.  FINDINGS:  Rectal exam was unremarkable.  A pediatric colonoscope was inserted into an adequately prepped colon and advanced to the cecum where ileocecal valve and appendiceal orifice were identified.  On insertion, there was an 8-mm pedunculated polyp in the sigmoid colon that was removed with snare cautery.  In order to reach the cecum, there was significant looping that occurred and the colon was very tortuous.  Repeated loop reduction was necessary as was abdominal pressure and placing the patient in the supine position.  On careful withdrawal from the cecum, a 1.5 cm pedunculated and sessile polyp was again noted and this was partially removed with snare cautery.  A small portion of the polyp was left intact and due to excessive looping and spasms in the colon would have to reevaluate it at a later date.  On further withdrawal, a large sigmoid pedunculated polyp was again noted which was approximately 3 cm in size and had a 1.5-cm polyp near adjacent to it.  This large polyp was biopsied for histologic purposes. 2 mL of [place] ink were injected into an adjacent fold for tattooing. The colonoscope was further withdrawn and there was scattered sigmoid diverticula seen.  Retroflexion was done which revealed small internal hemorrhoids.  Assessment: Colon polyps as stated above, the largest being a pedunculated polyp in the sigmoid colon that was biopsied and tattooed concerning for malignancy.  PLAN:  Follow up on path.     Shirley Friar, MD     VCS/MEDQ  D:  07/22/2010  T:  07/23/2010  Job:  [id number]  Electronically Signed by Charlott Rakes MD on 08/08/2010 12:36:42 PM

## 2010-08-08 NOTE — Consult Note (Signed)
[name], [name]                ACCOUNT NO.:  [id number]  MEDICAL RECORD NO.:  [id number]           PATIENT TYPE:  I  LOCATION:  3728                         FACILITY:  [facility]  PHYSICIAN:  Shirley Friar, MDDATE OF BIRTH:  12-Jun-1932  DATE OF CONSULTATION:  07/21/2010 DATE OF DISCHARGE:                                CONSULTATION   REQUESTING PHYSICIAN:  Pleas Koch, MD  REASON FOR CONSULTATION:  Nausea, vomiting, abdominal pain, and diarrhea.  HISTORY OF PRESENT ILLNESS:  [name] is an unassigned white female who is 77 years old and being seen at the request of Dr. Mahala Menghini due to nausea, vomiting, abdominal pain, and diarrhea.  She was admitted in mid February to early March for the same thing.  At that time, she was treated with supportive care and her symptoms resolved.  After discharge, she states that she did okay until 2 days ago when she developed recurrence of profuse watery diarrhea, diffuse abdominal pain, although on my evaluation she points to her left side of her abdomen. She is also been having persistent nausea and vomiting.  Abdominal pain is crampy, diffuse, and associated with recurrent nausea and vomiting. The patient denies any hematemesis.  She has been having black tarry stools for the last 2 days.  On presentation, she was hemodynamically stable and found to have a leukocytosis of 15.7 on July 19, 2010.  Her white blood count is down to 9.4.  PAST MEDICAL HISTORY: 1. GERD. 2. History of anxiety. 3. History of chronic back pain. 4. History of depression. 5. Status post appendectomy, hysterectomy, and low back surgery.  CURRENT MEDICATIONS:  Flagyl, Zosyn, Phenergan, p.r.n. medicines listed now and as listed in hospital record.  ALLERGIES:  CIPRO.  FAMILY HISTORY:  Noncontributory.  SOCIAL HISTORY:  Denies alcohol, drugs, or smoking.  REVIEW OF SYSTEMS:  Negative from GI standpoint except as stated above.  PHYSICAL EXAMINATION:   VITAL SIGNS:  Temperature 98.5, pulse 98, and blood pressure 143/75. GENERAL:  Elderly alert, no acute distress. ABDOMEN:  Abdomen diffusely tender, especially in left upper quadrant with guarding in the left upper quadrant, minimal guarding otherwise, soft, nondistended, positive bowel sounds.  LABORATORY DATA:  White blood count 9.4 down from 15.7, hemoglobin 9.5, and platelet count 617.  C-diff negative.  Giardia negative. Cryptosporidium negative.  Fecal occult blood test negative.  Stool culture, no growth to date.  IMPRESSION:  This is a 78-year white female with recent onset of nausea, vomiting, diarrhea, and diffuse abdominal pain which is similar to an episode she had in mid February that required admission.  Main concern will be for an infectious etiology versus ischemic colitis.  I doubt inflammatory bowel disease.  A CT scan showed mild wall thickening of the descending colon and sigmoid colon and it also showed abnormality in the spleen suspicious for an infarct.  Gallbladder was unremarkable and no biliary dilation was seen.  Complete CT findings can be found in radiology report.  We will plan do a colonoscopy and endoscopy on July 22, 2010.  We will recommend checking lipase level and may need a surgical  consult regarding the splenic findings.  We will defer timing of that to the primary team.     Shirley Friar, MD     VCS/MEDQ  D:  07/21/2010  T:  07/22/2010  Job:  [id number]  cc:   L. Lupe Carney, M.D.  Electronically Signed by Charlott Rakes MD on 08/08/2010 12:32:55 PM

## 2010-08-08 NOTE — Op Note (Signed)
  [name], [name]                ACCOUNT NO.:  [id number]  MEDICAL RECORD NO.:  [id number]           PATIENT TYPE:  LOCATION:                                 FACILITY:  PHYSICIAN:  Shirley Friar, MDDATE OF BIRTH:  04/20/1932  DATE OF PROCEDURE: DATE OF DISCHARGE:                              OPERATIVE REPORT   INDICATION:  Diarrhea, nausea, vomiting, and abdominal pain.  MEDICATIONS:  Fentanyl 25 mcg IV, Versed 3 mg IV, additional medicine given for preceding colonoscopy.  FINDINGS:  The patient was very combative during the procedure and became even more combative during attempted intubation of the esophagus. The endoscope was advanced back into the oropharynx where it was noted to be narrowed at the opening of the esophagus.  There was resistance that occurred during attempted passage of the endoscope in this area and due to the mucosal lining and appearance of the stenosis as well as her combativeness the procedure was terminated at this point without intubation of the esophagus.  ASSESSMENT:  Question of an upper esophageal stricture versus diverticulum.  PLAN: 1. N.p.o. 2. Upper GI series in the a.m.     Shirley Friar, MD     VCS/MEDQ  D:  07/22/2010  T:  07/23/2010  Job:  [id number]  Electronically Signed by Charlott Rakes MD on 08/08/2010 12:33:06 PM

## 2010-08-29 NOTE — H&P (Signed)
[name], GADBOIS                ACCOUNT NO.:  [id number]  MEDICAL RECORD NO.:  [id number]           PATIENT TYPE:  E  LOCATION:  [facility]                         FACILITY:  [facility]  PHYSICIAN:  Eduard Clos, MDDATE OF BIRTH:  1933/01/21  DATE OF ADMISSION:  07/19/2010 DATE OF DISCHARGE:                             HISTORY & PHYSICAL   PRIMARY CARE PHYSICIAN:  [facility] [place] [facility].  CHIEF COMPLAINT:  Nausea, vomiting, abdominal pain and diarrhea.  HISTORY OF PRESENT ILLNESS:  A 77 year old female who was in the hospital on June 04, 2010, through June 21, 2010, for diarrhea, nausea, vomiting and has come with similar complaint.  The patient states that she was doing fine after she got discharged but she did have occasional diarrhea episodes but last 2 days she has been having constant diarrhea with diffuse abdominal pain with nausea and vomiting. In the ER, the patient had acute abdominal series which did not show anything acute.  The patient has leukocytosis and will be admitted for further workup.  The patient states that the abdominal pain is diffuse, colicky in nature along with multiple episodes of nausea and vomiting.  Denies any blood in the vomitus.  The patient says she also had multiple episodes of diarrhea which was dark colored.  The patient has subjective feeling of fever or chills.  Denies any chest pain, shortness of breath.  Denies any dizziness or loss of conscious or any focal deficit.  PAST MEDICAL HISTORY: 1. Anxiety. 2. Chronic back pain. 3. Depression. 4. GERD.  PAST SURGICAL HISTORY:  Appendectomy, hysterectomy, low-back surgery complicated by right foot drop with shortening of the right leg with paresthesia.  MEDICATIONS:  As per admission: 1. The patient is on Zoloft 100 mg p.o. daily. 2. Questran. 3. Protonix 40 mg daily. 4. OxyContin 40 mg q. 8 hourly. 5. Norco. 6. Neurontin. 7. Metoprolol 25 mg p.o. twice daily. 8. Loperamide 2 mg  q. 4. 9. Ferrous sulfate. 10.Aspirin. 11.Compazine.  ALLERGIES:  The patient has CIPRO which the patient had reaction last time, she was admitted today with generalized rash.  SOCIAL HISTORY:  The patient denies smoking cigarette, drinking alcohol or using illegal drugs.  FAMILY HISTORY:  Positive for hypertension.  REVIEW OF SYSTEMS:  As per history of present illness and nothing else significant.  PHYSICAL EXAMINATION:  GENERAL:  The patient examined at bedside not in acute distress. VITAL SIGNS:  Blood pressure is 110/60, pulse is 100-110 per minute, temperature 98.5, respirations 18 per minute, O2 sat 93%. HEENT: Anicteric.  No pallor.  No facial asymmetry.  Tongue is midline and dry. CHEST:  Bilateral air entry present.  No rhonchi, no crepitation. HEART:  S1, S2 heard. ABDOMEN:  Soft, mild tenderness in the epigastric and periumbilical area.  No guarding or rigidity.  Bowel sounds present. CNS:  Alert, awake and oriented to time, place and person.  Moves upper and lower extremities 5/5. EXTREMITIES:  Peripheral pulses felt.  No edema.  LABORATORY DATA:  CBC WBC is 15.7, hemoglobin is 10.2, hematocrit 33.8, platelets 807.  Basic metabolic panel sodium 136, potassium 4.8, chloride 105, carbon  dioxide 24, glucose 124, BUN 27.7, alkaline phosphatase 52, AST 23, ALT 30, total protein 7.2, albumin 3, calcium 8.9.  UA is negative for nitrite, and leukocytes.  Acute abdominal series shows improving left lung base pneumonia, no new consolidation and nonobstructive bowel gas pattern.  ASSESSMENT: 1. Persistent diarrhea with positive colitis. 2. Dehydration. 3. Microcytic hypochromic anemia. 4. History of low back pain. 5. History of anxiety.  PLAN: 1. At this time, admit the patient to telemetry as the patient is     significantly tachycardic. 2. For her diarrhea and colitis, we will get a CT abdomen and pelvis     with contrast. We will keep the patient n.p.o., appears  to     dehydrate the patient.  At this time, we are going to get stool     studies.  The patient being on Zosyn and Flagyl.  If the patient     appear to be positive for C diff, at that time we can discontinue     Zosyn. 3. Her regular medication at this time will be held until the patient     can take p.o. which will be decided in the morning. 4. Further plans based on the CT abdomen and pelvis, test order and     the patient's clinical course.     Eduard Clos, MD     ANK/MEDQ  D:  07/20/2010  T:  07/20/2010  Job:  [id number]  cc:   Deboraha Sprang at [facility]  Electronically Signed by Midge Minium MD on 08/29/2010 08:09:53 AM

## 2010-09-03 NOTE — Discharge Summary (Signed)
NAME:  [name], [name] NO.:  [id number]   MEDICAL RECORD NO.:  [id number]          PATIENT TYPE:  IPS   LOCATION:  0300                          FACILITY:  [facility]   PHYSICIAN:  Jasmine Pang, M.D. DATE OF BIRTH:  09-11-1932   DATE OF ADMISSION:  05/23/2006  DATE OF DISCHARGE:  05/30/2006                               DISCHARGE SUMMARY   IDENTIFYING INFORMATION:  This is a 77 year old widowed white female who  was admitted on a voluntary basis on May 23, 2006.   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  She was also calling to request another refill on her  medications but said that she needed it refilled again because she had  not been able to get out or have anyone pick up the previous  prescription.  Her physician was concerned about her safety and she had  admitted over the phone that she was not eating or drinking much and  that she was depressed.  She was referred to the ED who then referred  her to [place] for admission.  On the day of this assessment, the patient  reports onset of depression in Sep 12, 2002 following the death of her husband  who she referred to as my best friend in life.  Since his death, she  has been unable to really recover her social functioning as she was  before.  She had become increasingly depressed and this had been  relieved by Zoloft 100 mg daily that she started on shortly after he was  deceased.  She reports her depression getting markedly worse over the  course of the past year, during which time she has lost 30 pounds.  She  has had a lot of anhedonia, decreased appetite, increased irritability  with her previous friends.  She has been intolerant of a lot of social  activities and become isolative.  Her sleep has decreased to 4-6 hours  per night.  She began using Valium in order to cope with the insomnia  and  the use of the Valium has escalated over the course of time.  She  began using Valium some time last year and she says since December her  use of it has escalated to about 20 mg every night in order to sleep.  It is not helping the insomnia much and she began to have episodes of  blacking out.  She believes she blacked out for about 15 minutes on the  day of admission, fell and had a contusion.  She has a blackened left  eye and laceration around her left temple.  She endorses some passive  suicidal thoughts.  She was feeling unable to function.  She was anxious  to get back to previous functioning and just feel better.  There were no  homicidal thoughts.  No hallucinations.  This is the patient's first  inpatient psychiatric admission.  She has been managed in the past by  her primary care physician.  She denies any prior outpatient or  inpatient psychiatric treatment.  She did have some counseling after the  death of her husband in 09/26/02 by way of hospice.  She reports that Zoloft  is the only medicine she has taken and does not believe it has helped  her.  She denies any history of substance abuse.  No history of brain  injury or coma.  The patient is status post cataract surgery, multiple  back surgeries and an appendectomy 10 years ago.  She has also had an  abdominal hysterectomy in 09-26-74.  She is currently on Zoloft 100 mg daily  for least one year, Protonix 40 mg p.o. b.i.d., OxyContin 40 mg p.o.  q.8h., Percocet 5/325 mg, 1 tablet q.6h. p.r.n. breakthrough pain,  Neurontin 300 mg p.o. t.i.d.  The patient was recently admitted for  diarrhea in January and, at that time, responded to Questran 4 grams  daily p.r.n. for diarrhea and also Imodium as needed for diarrhea.   ALLERGIES:  She has no known drug allergies.   PHYSICAL EXAMINATION:  The patient's full physical exam was done in the  emergency room.  It is noted in the record.  On presentation here, she  is frail, somewhat  agitated and disheveled-appearing white female,  pleasant but pressured speech, tearful but coherent, well-oriented.  She  is very upset about her current circumstances and somewhat frustrated  having to be here.   LABORATORY DATA:  Hepatic function profile was grossly within normal  limits except for a slightly decreased total protein of 5.5 and a  slightly decreased albumin of 2.7.  TSH was within normal limits on  May 23, 2006 and May 25, 2006.  Urinalysis revealed a few  bacteria, 21-50 wbc's.  Her electrolytes revealed a sodium of 139,  potassium of 3.5, chloride of 109, carbon dioxide 21, BUN 9, creatinine  0.81.  Liver enzymes are already documented above.  WBC was 12.7,  hemoglobin 11.2, hematocrit 34.5, MCV 83.3.   HOSPITAL COURSE:  Upon admission, the patient was started on Protonix 40  mg p.o. b.i.d., OxyContin 40 mg p.o. q.8h., oxycodone 6.5/650 mg, 1 p.o.  q.6-8h. pain, Neurontin 300 mg p.o. t.i.d., Ambien 5 mg p.o. q.h.s.  On  May 23, 2006, the patient was given Norco 5/325 mg, 1 dose now for  pain where she had fallen and bruised her head.  She was also placed on  one-to-one due to being a high fall risk.  On May 23, 2006, the  patient was placed on lorazepam 1 mg now and 1 mg q.6h. p.r.n.  withdrawal symptoms, folic acid 1 mg daily, multivitamin daily, thiamine  100 mg daily, Colace 100 mg p.o. daily.  She was ordered some Anusol-HC  cream every day after showers and after each bowel movement, Zoloft 25  mg daily, Protonix 40 mg p.o. b.i.d., Ensure q.i.d. p.r.n.  On May 23, 2006, Percocet was changed to 5/325 mg p.o. q.6h. p.r.n. pain  (verified by [employer]).  Anusol was discontinued and she  was placed on Preparation H after each loose stool, Anusol-HC once  daily, apply after shower.  The patient's one-to-one was continued due  to her high fall risk.  On May 25, 2006, Zoloft was discontinued since the patient felt this was not  helping.  Instead, she was begun on  Effexor XR 37.5 mg in the morning.  One-to-one was continued due to her  high fall risk.  On May 26, 2006, Effexor  was increased to 75 mg  daily.  On May 27, 2006, the patient's Effexor XR 75 mg was  discontinued.  Instead, she was placed on Effexor 37.5 mg XR p.o. q.d.  due to GI upset.  She was also placed on Phenergan 12.5 mg p.o. q.6h.  p.r.n. nausea and vomiting.  On May 28, 2006, Effexor XR was  increased back to 75 mg p.o. q.d.  The patient tolerated her medications  well with no significant side effects.   On first meeting the patient on May 23, 2006, she was lying in bed  moaning and irritable.  She states I was lied to when I came over  here.  She felt she was being treated poorly.  She states she was here  because she was on Valium and her doctor thought she was taking too  much.  She stated she wanted to get off of it.  She talked about her  depression after her husband died three years ago I have no family.  On May 24, 2006, the patient was less agitated and irritable, though  she had a lot of complaints.  She stated she felt as if she had been  shipped off to rehab even though I explained we are not a long-term  facility.  She was tearful as she discussed this.  She tried to tell the  casemanager and myself how to locate her son but could not remember his  phone number or which city in [place] he lived in.  On May 25, 2006,  the patient was still demanding but less so than upon admission.  She  had less complaints of pain.  She states she had a horrible night.  She was happy to see her daughter-in-law who flew up from [place]  yesterday.  Son was coming to visit today.  She wanted to stay with her  sister in [place], [place] because she is afraid to be alone.  Her  son, however, stated she could not stay with his sister because she also  was in poor health and would not be able to take care of his mother.  They  began to look for assisted living for her,   On May 26, 2006, the patient had calmed down markedly.  She was  tired when I saw her.  She had just gotten her OxyContin for pain.  She  still thought she may go to [place] to be with her sister even though  her son says this will not happen.  She appears to be compliant with  whenever her son's wishes are in this matter.  On May 27, 2006, the  patient was complaining of some nausea and vomiting.  Her Effexor had  just been increased to 75 mg q.d.  Her Effexor XR had just been  increased to 75 mg q.d.  This was lowered back to 37.5 mg and given  Phenergan for nausea.  On May 28, 2006, the nausea had resolved.  She felt somewhat better.  The Effexor XR was increased back to 75 mg  p.o. q.d.   On May 29, 2006, the patient stated she felt anxious about leaving  and was glad she did not have to go today.  Initially, we had talked about her leaving for the assisted-living home today but she became  quite anxious and it was decided to hold off for one day.  She was  focused on wanting her pocketbook before she went but admits that it was  in [place] with her son.  She admitted to being scared about the  assisted-living but felt she could make the transition the next day.  On  May 30, 2006, the patient's mental status had improved markedly  from admission status.  She was lying in bed quietly, friendly and  cooperative.  She had good eye contact.  Speech normal rate and flow.  Psychomotor activity was still retarded.  Mood anxious but overall  euthymic.  Affect consistent with mood.  No suicidal or homicidal  ideation.  No self-injurious behavior.  No auditory or visual  hallucinations.  No paranoia or delusions.  Thoughts were logical and  goal-directed.  Thought content no predominant theme.  Cognitive was  grossly within normal limits and the patient was felt safe to be  transitioned today to the assisted-living group  home.   DISCHARGE DIAGNOSES:  AXIS I:  Major depression, recurrent, severe.  Benzodiazepine abuse.  AXIS II:  None.  AXIS III:  Chronic back pain with leukocytosis not otherwise specified,  chronic constipation and some history of chronic anemia with previous  hemoglobin at 9.5.  AXIS IV:  Severe (issues with protracted grief and social isolation,  lack of social support, chronic pain is a severe medical problem and  complication).  AXIS V:  GAF upon discharge 50; GAF upon admission 28; GAF highest past  year 44.   ACTIVITY/DIET:  There were no significant dietary restrictions.  Activity level was limited by needing to increase activity slowly and  walk with assistance such as a walker.   POST-HOSPITAL CARE PLANS:  The patient will see Dr. Donell Beers on Thursday,  June 08, 2006 at 10 a.m. at the assisted-living home.   DISCHARGE MEDICATIONS:  1. Neurontin 300 mg at 8 a.m., 12 noon and 5 p.m.  2. Effexor XR 75 mg daily.  3. Protonix 40 mg EC 7 a.m. and 5 p.m.  4. Folic acid 1 gram daily.  5. Oxycodone 40 mg SR 6 a.m., 2 p.m. and 10 p.m.  6. Colace 100 mg daily/  7. Anusol-HC cream after showers daily.      Jasmine Pang, M.D.  Electronically Signed     BHS/MEDQ  D:  05/30/2006  T:  05/30/2006  Job:  [id number]

## 2010-09-03 NOTE — H&P (Signed)
[name], [name]                ACCOUNT NO.:  [id number]   MEDICAL RECORD NO.:  [id number]          PATIENT TYPE:  INP   LOCATION:  5522                         FACILITY:  [facility]   PHYSICIAN:  Michelene Gardener, MD    DATE OF BIRTH:  [birth year]   DATE OF ADMISSION:  04/11/2006  DATE OF DISCHARGE:                              HISTORY & PHYSICAL   PRIMARY CARE PHYSICIAN:  Dr. Lupe Carney.   CHIEF COMPLAINT:  Increasing episodes of diarrhea; started today.   HISTORY OF PRESENT ILLNESS:  [name] is a 77 year old Caucasian  female.  Past medical history of home O2 and medical problems.  Presented with the above-mentioned complaint.  Patient was admitted  recently at [facility] for evaluation of diarrhea and, at that  time, extensive workup including CT scan of the abdomen and stool  studies were done and they all came to be negative.  Patient was sent  home on Imodium to control her symptoms, and at that time she was  stable.  She stated that she has been doing fine since discharge and  today she started developing severe diarrhea.  She has been having  diarrhea every 10-20 minutes as before.  The diarrhea was described as  watery diarrhea.  There is no blood and there is no mucous.  She denied  nausea.  There is no vomiting.  There is mild abdominal pain.  She came  into the ER and was started on IV fluids.  Hospitalist service was  called for further evaluation.   PAST MEDICAL HISTORY:  Significant for:  1. Lichen planus of the mouth, with gastroesophageal reflux disease.  2. Chronic back pain.  3. Depression.  4. Appendectomy.  5. History of hypertension, which is diet controlled.   MEDICATIONS:  Include:  1. Ambien 5-10 mg p.o. at bedtime p.r.n.  2. Neurontin, unknown dose.  3. OxyContin 40 mg p.o. q.8 hours.  4. Percocet 5/325 p.o. q.4 hours p.r.n.  5. Protonix 40 mg p.o. once daily.  6. Zoloft 100 mg p.o. once daily.   PAST SURGICAL HISTORY:  1. Status  post appendectomy 10 years ago.  2. Abdominal hysterectomy in 1976.  3. Status post cataract surgery in 1995.  4. Back surgery.   ALLERGIES:  NO KNOWN DRUG ALLERGIES.   FAMILY HISTORY:  Her mother had a stroke and died in her 51s and also  had coronary artery disease.  Her father had bladder cancer.   SOCIAL HISTORY:  Patient is widowed.  She lives alone.  She denies  smoking and denies alcohol drinking.   REVIEW OF SYSTEMS:  CONSTITUTIONAL:  Positive for fatigability.  EYES:  No blurred vision.  No change.  ENT:  No tinnitus.  No difficulty  swallowing.  RESPIRATORY:  No cough.  No wheezing.  CARDIOVASCULAR:  No  chest pain.  No shortness of breath.  GI:  Positive for diarrhea and  mild abdominal discomfort.  There is no nausea and no vomiting.  GU:  No  dysuria.  No hematuria.  ENDOCRINE:  No polyuria.  No dysuria.  HEMATOLOGY:  No bruising or bleeding.  SKIN:  No rash.  No lesions.  NEURO:  No numbness or tingling.  The rest of systems reviewed and they  were negative.   PHYSICAL EXAMINATION:  VITAL SIGNS:  Temperature is 98.0.  Pulse 87.  Respiratory rate 18.  Blood pressure is 117/73.  GENERAL APPEARANCE:  This is an elderly Caucasian female, who laying  down in bed.  In no acute distress at the present time.  HEENT:  Conjunctivae is normal.  No blood.  No erythema.  Pupils are  equal and reactive to light and accommodation.  Hearing is diminished.  There is no ear discharge or infection.  There is no nasal discharge or  bleeding.  Oral mucosa is dry.  No pharyngeal erythema.  NECK:  Supple.  No JVD.  No carotid bruit.  No  lymphadenopathy.  No  thyroid enlargement or tenderness.  CARDIOVASCULAR EXAMINATION:  S1 and S2 are regular.  There are no  murmur, rubs, or gallops.  RESPIRATORY EXAMINATION:  Patient is breathing between 16 and 18.  There  is no use of accessory muscle.  No intercostal retractions.  No lift.  No rales, no rhonchi and no wheezes.  ABDOMEN:  The abdomen  is soft, nondistended, nontender.  No  hepatosplenomegaly.  Bowel sounds are normal.  LOWER EXTREMITIES:  No edema.  No rash.  NEURO EXAMINATION:  Cranial nerves are intact II-XII.  There are no  motor or sensory deficits.   LAB RESULTS:  WBC 10.8, hemoglobin 11.4, hematocrit 35.4, MCV 84.7,  platelet count 649.  Sodium 158, potassium 4.2, chloride 108, bicarb 24,  glucose 117, BUN 12, creatinine 0.9.   IMPRESSION AND ASSESSMENT:  1. Chronic diarrhea.  This patient has a recent admission with that      diarrhea and at that time, patient has been worked up with CT scan      and with stool studies and those came to be normal.  Patient was      started on Imodium at that time and that helped her.  I will admit      her to the floor to compensate her fluid loss.  I will start her on      IV fluids.  I will also start her on Imodium to control her      symptoms and meanwhile, I will send for stool studies, including a      stool culture, stool ova and parasites and stool C. diff.  If her      diarrhea continues, then I will consider a gastroenterology      consultation.  2. Thrombocytosis.  This is most likely the reactive thrombocytosis.      I will just monitor her platelets.  There is no need for      intervention at this time.  3. Hypertension.  It is diet controlled.  I cannot get her blood      pressure stable, I will just watch her blood pressure.  4. Depression.  We will continue her Zoloft.  5. Gastroesophageal reflux disease.  I will continue her on Protonix.  6. Chronic back pain.  We will continue her pain medicine and we will      get a physical therapy evaluation.   TOTAL ASSESSMENT TIME:  50 minutes.      Michelene Gardener, MD  Electronically Signed     NAE/MEDQ  D:  04/11/2006  T:  04/12/2006  Job:  [id number]   cc:  Elsworth Soho, M.D.

## 2010-09-03 NOTE — Op Note (Signed)
NAME:  MERLEAN, PIZZINI                          ACCOUNT NO.:  [id number]   MEDICAL RECORD NO.:  [id number]                   PATIENT TYPE:  INP   LOCATION:  3102                                 FACILITY:  [facility]   PHYSICIAN:  Payton Doughty, M.D.                   DATE OF BIRTH:  19-Jun-1932   DATE OF PROCEDURE:  05/13/2003  DATE OF DISCHARGE:                                 OPERATIVE REPORT   PREOPERATIVE DIAGNOSIS:  Spondylosis and spinal stenosis L1-L2 and scoliosis  L2 to S1.   POSTOPERATIVE DIAGNOSIS:  Spondylosis and spinal stenosis L1-L2 and  scoliosis L2 to S1.   OPERATIVE PROCEDURE:  L1-L2 decompressive laminectomy bilaterally and L1 to  S1 bilateral segmental pedicle screw fixation with instrumentation and  posterolateral arthrodesis from L1 to S1.   SURGEON:  Payton Doughty, M.D.   SERVICE:  Neurosurgery   ANESTHESIA:  General endotracheal anesthesia.   PREPARATION:  Betadine and alcohol wipe.   COMPLICATIONS:  None.   ASSISTANT:  Nurse assistant [facility]  Doctor assistant Hilda Lias, M.D.   BODY OF TEXT:  This is a 77 year old woman with severe spondylosis and  spinal stenosis at L1-L2, fusions from L2 to S1 with progressive  kyphoscoliosis.  She is taken to the operating room, general anesthesia,  intubated, placed prone on the operating table.  Following shave, prep, and  drape in the usual sterile fashion, the skin was incised from the bottom of  T12 to S1 and the transverse processes and pedicles of L1, L2, L3, L4, L5,  S1, and the sacral ala were exposed bilaterally in the subperiosteal plane.  Interoperative x-ray was not obtained because of the pedicle screws that  were in place at 3-4 on the left side and served as markers.  The old  pedicle screws at 3-4 on the left side were removed without difficulty.  The  lamina and pedicles of each respective level were exposed bilaterally.  The  bilateral laminectomy of L1 was done to decompress the L1-L2  interspace  which had been demonstrated on myelogram to be compressed.  Following  complete decompression, pedicle screws were then placed at L1, L2, L3, L4,  L5, and S1 on the left side and L1, L2, L3, L5, and S1 on the right.  The L4  pedicle could not be tapped on the right side as it had fractured.  Pedicle  screws were then placed under x-ray guidance and interoperative x-ray showed  good placement of pedicle screws.  They were connected with rods that were  appropriately contoured.  The transverse processes were decorticated and  used inter-transverse bone of cadaver chips and DBX were mixed together as a  posterolateral arthrodesis at the inter-transverse area.  Prior to placing  the bone graft, the entire field was irrigated and hemostasis assured.  The  fascia was reapproximated with 0 Vicryl in an interrupted fashion,  the  subcutaneous tissue were reapproximated with 0 Vicryl in an interrupted  fashion, the subcuticular tissues were reapproximated with 3-0  Vicryl in an interrupted fashion, and the skin was closed with 3-0 nylon in  a running locked fashion.  Betadine and Telfa dressing was applied and made  occlusive with OpSite and the patient returned to the recovery room in good  condition.                                               Payton Doughty, M.D.    MWR/MEDQ  D:  05/13/2003  T:  05/13/2003  Job:  [id number]

## 2010-09-03 NOTE — H&P (Signed)
NAMEMarland Kitchen  [name], [name] NO.:  [id number]   MEDICAL RECORD NO.:  [id number]          PATIENT TYPE:  IPS   LOCATION:  0300                          [facility]ACILITY:  [facility]   PHYSICIAN:  Jasmine Pang, M.D. DATE OF BIRTH:  Nov 07, 1932   DATE OF ADMISSION:  05/23/2006  DATE OF DISCHARGE:                       PSYCHIATRIC ADMISSION ASSESSMENT   IDENTIFICATION:  This is a 77 year old widowed white female.  This is a  voluntary admission.   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  She was also calling to request another refill on her  medications but said that she needed it refilled again because that she  had not been able to get out or have anyone pick up the previous  prescription.  Her physician was concerned about her safety and she had  admitted over the phone that she was not eating or drinking much and  that she was very depressed, and she was referred for admission.  Today  she reports onset of depression in 09-03-2002 following the death of her  husband who is she refers to as my best friend in life.  Since his  death she has been unable to really recover her social functioning as  she was before, has become increasingly depressed and this has been  unrelieved by the Zoloft 100 mg that she was started on shortly after he  was deceased.  She reports her depression getting markedly worse over  the course of the past year during which time she has lost 30 pounds,  has had a lot of anhedonia, decreased appetite, increased irritability  with her previous friends, intolerant of a lot of social activities and  sleep was decreased to 4-6 hours per night. She began using Valium in  order to cope with the insomnia and escalation of Valium has increased  over the course.  She began using of Valium some time last year and she  says since December her use of it has  escalated to about 20 mg every  night in order to sleep.  It is not helping the insomnia much and she  has begun having episodes of blacking out.  Believes that she blacked  out for about 15 minutes on the day of admission, fell,  and has a  contusion.  She has a blackened left eye and a laceration  around her  left temple.  She endorses some passive suicidal thoughts, feeling  unable to function, is a anxious to get back to previous functioning and  just feel better.  No homicidal thoughts.  No hallucinations.   PAST PSYCHIATRIC HISTORY:  This is the patient's first inpatient  psychiatric admission.  She has been managed in the past by her primary  care physician, denies any prior outpatient or inpatient psychiatric  treatment.  She did have some counseling after the death of her husband  in Sep 03, 2002.  [facility].  Reports that Zoloft is the only medicine  that she has taken  and does not believe that it has helped her.  Denies  any history of substance abuse.  No history of brain injury or coma.   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  Married more than 20 years  and also provided care to her husband who died of complications of  circulatory disease.  She endorses a lot of loneliness and social  isolation.  She has one son who lives in [place] who had also called the  patient's physician because he was concerned about her ability to  function safely at home,  and has no legal problems, owns her own home  and until the last couple weeks has been functioning independently,  ambulates with a walker.   FAMILY HISTORY:  Is unclear.   ALCOHOL AND DRUG HISTORY:  No history of substance abuse other than the  current abuse of Valium noted above.   MEDICAL HISTORY:  The patient's primary care [name] is Dr. Lupe Carney but she has the most interaction with her neurosurgeon Trey Sailors, MD and has also been seen in the  past by Dr. Yetta Barre  gastroenterologist. Medical problems include chronic back pain with  history of multiple surgeries, spinal spondylosis by history, some  issues with chronic constipation alternating with diarrhea with some  recent tarry stools, history of major depression and history of oral  lichen planus which is not currently a problem.   PAST MEDICAL HISTORY:  Also remarkable for appendectomy 10 years ago.  She is status post cataract surgery in 1995 and abdominal hysterectomy  in 1976, multiple back surgeries  by Dr. Trey Sailors.   FAMILY HISTORY:  Is remarkable for mother who had a stroke and died in  her 22s and father with bladder cancer.   CURRENT MEDICATIONS:  Zoloft 100 mg daily which she has been on for  least 1 year, claims it not working, Protonix 40 mg p.o. b.i.d.,  OxyContin 40 mg p.o. q. 8 hours, Percocet 5/325 mg 1 tablet q. six  p.r.n. for breakthrough pain.  Neurontin 300 mg p.o. t.i.d.  The patient  was recently admitted for diarrhea in January and at that time responded  to Questran 4 grams daily p.r.n. for diarrhea and also Imodium as needed  for diarrhea.   DRUG ALLERGIES:  None.   POSITIVE PHYSICAL FINDINGS:  The patient's full physical exam was done  in the emergency room.  It is noted in the record.  On presentation  here, she is a frail, somewhat agitated and disheveled appearing white  female, pleasant but with pressured speech, tearful, somewhat agitated,  but coherent and well oriented, quite upset about her current  circumstances and somewhat frustrated at having to be here.  On  admission to the unit, 5 feet 2 inches tall, 138 pounds, temperature 97,  pulse 89, respirations 20, blood pressure 127/73.   LABORATORY DATA:  CBC:  WBC 12.7, hemoglobin 11.2, hematocrit 34.5 and  MCV 83.3 and platelets of 739,000.  INR  1.1.  Electrolytes sodium 139, potassium 3.5, chloride 109, carbon dioxide 21, BUN nine, creatinine  0.81.  Liver enzymes SGOT 17, SGPT  14, alkaline phosphatase is 60 and  total bilirubin 0.4.  TSH is within normal limits as a 2.241.   MENTAL STATUS EXAM:  Fully alert female with  pressured speech, has been  upset about the way some of her medications have been handled, fears  that she is not going  to get her pain medicines as prescribed by Dr.  Channing Mutters.  She does settle down with quite a bit of reassurance and is able  to give [place] a coherent history but is quite tearful but appropriate.  Speech is somewhat pressured.  Mood is very depressed, hopeless,  helpless, admits to a lot of social isolation very lonely, talks about a  lot of grief. very sorrowful  about losing her husband,very little other  social supports, vague passive suicidal ideation without a plan.  Cognition is well preserved, oriented x3, appreciates her situation.  Concentration is adequate.  Impulse control and judgment within normal  limits, intellect within normal limits.   DIAGNOSES:  AXIS I:  Major depression recurrent, severe, benzodiazepine  abuse.  AXIS II:  Deferred.  AXIS III:  Chronic back pain, leukocytosis NOS, chronic constipation and  some history of chronic anemia with previous hemoglobin at 9.5.  AXIS IV:  Severe,  issues with protracted grief and social isolation,  lack of social supports.  Chronic pain is a severe medical problem and  complication.  AXIS V:  Current 28 past year 1.   PLAN:  Is to voluntarily admit the patient with q. 15-minute checks in  place with a goal of alleviating her depressed mood, calming her  agitation and making sure that she safely detoxes from Valium and  alleviating her passive suicidal thoughts. We are going to resume her  pain management program as prescribed by Dr. Trey Sailors and will  coordinate with him.  We have spoken with his office this morning to  clarify her meds and his concerns and we will be in touch with him at a  minimum prior to discharge. She does not believe that the Zoloft has  ever  done her any good so we are going to begin to taper that off and  will talk more about additional antidepressant therapy, possibly  tomorrow after she has had a chance to settle down. We will consider a  Effexor XR, and she has already talked with Dr. Channing Mutters about following up  with Dr. Emerson Monte and would like to do this, so we are going to  try to get her established with Dr. Nolen Mu is an outpatient  psychiatrist and possibly some counseling related to her multiple grief  issues.      Margaret A. Lorin Picket, N.P.      Jasmine Pang, M.D.  Electronically Signed    MAS/MEDQ  D:  05/23/2006  T:  05/23/2006  Job:  [id number]

## 2010-09-03 NOTE — Consult Note (Signed)
NAMEMarland [name]  DANELIA, SNODGRASS NO.:  [id number]   MEDICAL RECORD NO.:  [id number]          PATIENT TYPE:  IPS   LOCATION:  0300                          FACILITY:  [facility]   PHYSICIAN:  Payton Doughty, M.D.      DATE OF BIRTH:  Sep 21, 1932   DATE OF CONSULTATION:  DATE OF DISCHARGE:                                 CONSULTATION   She was seen in the emergency room May 22, 2006.  This was at the  request of her daughter, [name].  I was called at my office by [name]  daughter, [name].   [name] is a 77 year old right handed white female who is a  patient of mine for a long time.  Apparently over the weekend, she had  been crying, yelling over the phone.  She had actually called on Friday  for pain medications which we made arrangements for her to get.  Apparently, there was a 911 call or somehow the ambulance got to her  head today and found she fell and cut her head.  She was transported to  [facility] where she was hollering for medications and saying that she was out  of her meds.  When I talked to her on Friday, she was crying saying that  she missed her husband.  When she is in the emergency room today, she  has also been crying saying that she misses her husband.  Her daughter  told me that by looking her [name] bill that she had been getting  medications on-line.  When I asked Tyisha Cressy about this, she said she was  getting 10 mg Valium sixty of them on-line and using those and that she  had also taken some medications of her deceased husband's that were  laying around the house and she was not sure what that medication was.  This is in addition to the OxyContin Percocet for which she is written  from my office that she actually uses on a scheduled fashion reliably.  Most unsettling was when I told her that mixing medications was  dangerous for her, she said she did not care if she lived or died.   She had lumbar fusion and is written for OxyContin and Percocet but not  for Valium.  This has been discussed with her daughter [name] who is  coming up from [place] tomorrow.   PHYSICAL EXAMINATION:  She is awake, alert and oriented x3.  Her cranial  nerves are intact.  Motor exam shows intact strength.  However [name]  is disheveled in appearance, she is very tearful, she says she misses  her husband and says she does not care if she is alive.   I believe [name] is very depressed, dangerously so and has a serious  substance abuse problem.  I discussed this with her daughter [name] and  believe that she will require inpatient psychiatry for both problems.  The behavioral health was contacted by the emergency room folks.  I gave  them all the details of what had gone on this weekend and told them  that  I felt that commitment was appropriate.           ______________________________  Payton Doughty, M.D.     MWR/MEDQ  D:  05/22/2006  T:  05/23/2006  Job:  [id number]

## 2010-09-06 ENCOUNTER — Inpatient Hospital Stay (HOSPITAL_COMMUNITY)
Admission: AD | Admit: 2010-09-06 | Discharge: 2010-09-29 | DRG: 371 | Disposition: A | Discharge status: Skilled Nursing Facility | Payer: Medicare Other | Source: Other Acute Inpatient Hospital | Attending: Internal Medicine | Admitting: Internal Medicine

## 2010-09-06 ENCOUNTER — Emergency Department (INDEPENDENT_AMBULATORY_CARE_PROVIDER_SITE_OTHER): Payer: Medicare Other

## 2010-09-06 ENCOUNTER — Emergency Department (HOSPITAL_BASED_OUTPATIENT_CLINIC_OR_DEPARTMENT_OTHER): Payer: Medicare Other

## 2010-09-06 ENCOUNTER — Emergency Department (HOSPITAL_BASED_OUTPATIENT_CLINIC_OR_DEPARTMENT_OTHER)
Admission: EM | Admit: 2010-09-06 | Discharge: 2010-09-06 | Disposition: A | Discharge status: Other Acute Inpatient Hospital | Payer: Medicare Other | Source: Home / Self Care | Attending: Emergency Medicine | Admitting: Emergency Medicine

## 2010-09-06 DIAGNOSIS — Z7901 Long term (current) use of anticoagulants: Secondary | ICD-10-CM

## 2010-09-06 DIAGNOSIS — R197 Diarrhea, unspecified: Secondary | ICD-10-CM

## 2010-09-06 DIAGNOSIS — I472 Ventricular tachycardia, unspecified: Secondary | ICD-10-CM | POA: Diagnosis present

## 2010-09-06 DIAGNOSIS — E872 Acidosis, unspecified: Secondary | ICD-10-CM | POA: Diagnosis present

## 2010-09-06 DIAGNOSIS — D72829 Elevated white blood cell count, unspecified: Secondary | ICD-10-CM | POA: Diagnosis present

## 2010-09-06 DIAGNOSIS — I2510 Atherosclerotic heart disease of native coronary artery without angina pectoris: Secondary | ICD-10-CM

## 2010-09-06 DIAGNOSIS — I82890 Acute embolism and thrombosis of other specified veins: Secondary | ICD-10-CM | POA: Diagnosis present

## 2010-09-06 DIAGNOSIS — J90 Pleural effusion, not elsewhere classified: Secondary | ICD-10-CM | POA: Insufficient documentation

## 2010-09-06 DIAGNOSIS — D7389 Other diseases of spleen: Secondary | ICD-10-CM | POA: Diagnosis present

## 2010-09-06 DIAGNOSIS — D509 Iron deficiency anemia, unspecified: Secondary | ICD-10-CM | POA: Diagnosis present

## 2010-09-06 DIAGNOSIS — E871 Hypo-osmolality and hyponatremia: Secondary | ICD-10-CM | POA: Diagnosis present

## 2010-09-06 DIAGNOSIS — D47Z9 Other specified neoplasms of uncertain behavior of lymphoid, hematopoietic and related tissue: Secondary | ICD-10-CM | POA: Diagnosis present

## 2010-09-06 DIAGNOSIS — M6282 Rhabdomyolysis: Secondary | ICD-10-CM | POA: Diagnosis present

## 2010-09-06 DIAGNOSIS — G8929 Other chronic pain: Secondary | ICD-10-CM | POA: Diagnosis present

## 2010-09-06 DIAGNOSIS — B741 Filariasis due to Brugia malayi: Secondary | ICD-10-CM | POA: Insufficient documentation

## 2010-09-06 DIAGNOSIS — K219 Gastro-esophageal reflux disease without esophagitis: Secondary | ICD-10-CM | POA: Diagnosis present

## 2010-09-06 DIAGNOSIS — M549 Dorsalgia, unspecified: Secondary | ICD-10-CM

## 2010-09-06 DIAGNOSIS — R112 Nausea with vomiting, unspecified: Secondary | ICD-10-CM

## 2010-09-06 DIAGNOSIS — D62 Acute posthemorrhagic anemia: Secondary | ICD-10-CM | POA: Diagnosis not present

## 2010-09-06 DIAGNOSIS — R109 Unspecified abdominal pain: Secondary | ICD-10-CM | POA: Insufficient documentation

## 2010-09-06 DIAGNOSIS — R0602 Shortness of breath: Secondary | ICD-10-CM | POA: Insufficient documentation

## 2010-09-06 DIAGNOSIS — I7411 Embolism and thrombosis of thoracic aorta: Secondary | ICD-10-CM | POA: Diagnosis present

## 2010-09-06 DIAGNOSIS — R5381 Other malaise: Secondary | ICD-10-CM | POA: Diagnosis present

## 2010-09-06 DIAGNOSIS — I5189 Other ill-defined heart diseases: Secondary | ICD-10-CM | POA: Diagnosis present

## 2010-09-06 DIAGNOSIS — A0472 Enterocolitis due to Clostridium difficile, not specified as recurrent: Principal | ICD-10-CM | POA: Diagnosis present

## 2010-09-06 DIAGNOSIS — D473 Essential (hemorrhagic) thrombocythemia: Secondary | ICD-10-CM | POA: Diagnosis present

## 2010-09-06 DIAGNOSIS — D638 Anemia in other chronic diseases classified elsewhere: Secondary | ICD-10-CM | POA: Diagnosis present

## 2010-09-06 DIAGNOSIS — K922 Gastrointestinal hemorrhage, unspecified: Secondary | ICD-10-CM | POA: Diagnosis not present

## 2010-09-06 DIAGNOSIS — F341 Dysthymic disorder: Secondary | ICD-10-CM | POA: Diagnosis present

## 2010-09-06 DIAGNOSIS — J189 Pneumonia, unspecified organism: Secondary | ICD-10-CM | POA: Diagnosis not present

## 2010-09-06 DIAGNOSIS — I4729 Other ventricular tachycardia: Secondary | ICD-10-CM | POA: Diagnosis present

## 2010-09-06 DIAGNOSIS — K5289 Other specified noninfective gastroenteritis and colitis: Secondary | ICD-10-CM | POA: Insufficient documentation

## 2010-09-06 DIAGNOSIS — K208 Other esophagitis without bleeding: Secondary | ICD-10-CM | POA: Diagnosis not present

## 2010-09-06 DIAGNOSIS — E876 Hypokalemia: Secondary | ICD-10-CM | POA: Diagnosis present

## 2010-09-06 LAB — COMPREHENSIVE METABOLIC PANEL
ALT: 10 U/L (ref 0–35)
Alkaline Phosphatase: 129 U/L — ABNORMAL HIGH (ref 39–117)
BUN: 18 mg/dL (ref 6–23)
CO2: 22 mEq/L (ref 19–32)
Calcium: 8.5 mg/dL (ref 8.4–10.5)
GFR calc non Af Amer: >60 mL/min (ref >60)
Glucose, Bld: 172 mg/dL — ABNORMAL HIGH (ref 70–99)
Sodium: 127 mEq/L — ABNORMAL LOW (ref 135–145)

## 2010-09-06 LAB — URINALYSIS, ROUTINE W REFLEX MICROSCOPIC
Protein, ur: 30 mg/dL — AB
Urobilinogen, UA: 0.2 mg/dL (ref 0.0–1.0)

## 2010-09-06 LAB — DIFFERENTIAL
Basophils Absolute: 0 10*3/uL (ref 0.0–0.1)
Basophils Relative: 0 % (ref 0–1)
Lymphocytes Relative: 2 % — ABNORMAL LOW (ref 12–46)
Monocytes Relative: 3 % (ref 3–12)
Neutro Abs: 50.9 10*3/uL — ABNORMAL HIGH (ref 1.7–7.7)

## 2010-09-06 LAB — CBC
Hemoglobin: 9.7 g/dL — ABNORMAL LOW (ref 12.0–15.0)
MCH: 23.4 pg — ABNORMAL LOW (ref 26.0–34.0)
RBC: 4.14 MIL/uL (ref 3.87–5.11)

## 2010-09-06 LAB — LIPASE, BLOOD: Lipase: 7 U/L — ABNORMAL LOW (ref 11–59)

## 2010-09-06 LAB — PROTIME-INR: INR: 1.29 (ref 0.00–1.49)

## 2010-09-06 MED ORDER — IOHEXOL 350 MG/ML SOLN
100.0000 mL | Freq: Once | INTRAVENOUS | Status: AC | PRN
  Administered 2010-09-06: 100 mL via INTRAVENOUS

## 2010-09-07 LAB — TSH: TSH: 2.424 u[IU]/mL (ref 0.350–4.500)

## 2010-09-07 LAB — CBC
Platelets: 475 10*3/uL — ABNORMAL HIGH (ref 150–400)
RDW: 19.4 % — ABNORMAL HIGH (ref 11.5–15.5)
WBC: 29.8 10*3/uL — ABNORMAL HIGH (ref 4.0–10.5)

## 2010-09-07 LAB — BASIC METABOLIC PANEL
BUN: 15 mg/dL (ref 6–23)
GFR calc non Af Amer: >60 mL/min (ref >60)
Potassium: 4.3 mEq/L (ref 3.5–5.1)
Sodium: 129 mEq/L — ABNORMAL LOW (ref 135–145)

## 2010-09-07 LAB — CARDIAC PANEL(CRET KIN+CKTOT+MB+TROPI)
CK, MB: 4.3 ng/mL — ABNORMAL HIGH (ref 0.3–4.0)
CK, MB: 3.9 ng/mL (ref 0.3–4.0)
CK, MB: 4.2 ng/mL — ABNORMAL HIGH (ref 0.3–4.0)
Total CK: 506 U/L — ABNORMAL HIGH (ref 7–177)
Total CK: 425 U/L — ABNORMAL HIGH (ref 7–177)

## 2010-09-07 LAB — PRO B NATRIURETIC PEPTIDE: Pro B Natriuretic peptide (BNP): 980.8 pg/mL — ABNORMAL HIGH (ref 0–450)

## 2010-09-08 DIAGNOSIS — D47Z9 Other specified neoplasms of uncertain behavior of lymphoid, hematopoietic and related tissue: Secondary | ICD-10-CM

## 2010-09-08 LAB — CK TOTAL AND CKMB (NOT AT ARMC)
CK, MB: 2.9 ng/mL (ref 0.3–4.0)
Relative Index: 1 (ref 0.0–2.5)

## 2010-09-08 LAB — BASIC METABOLIC PANEL
Calcium: 7.6 mg/dL — ABNORMAL LOW (ref 8.4–10.5)
Chloride: 101 mEq/L (ref 96–112)
Creatinine, Ser: 0.7 mg/dL (ref 0.4–1.2)
GFR calc Af Amer: >60 mL/min (ref >60)
GFR calc non Af Amer: >60 mL/min (ref >60)

## 2010-09-08 LAB — LIPID PANEL
Cholesterol: 77 mg/dL (ref 0–200)
HDL: 31 mg/dL — ABNORMAL LOW (ref >39)
Total CHOL/HDL Ratio: 2.5 RATIO
Triglycerides: 76 mg/dL (ref <150)
VLDL: 15 mg/dL (ref 0–40)

## 2010-09-08 LAB — URINE CULTURE: Culture: NO GROWTH

## 2010-09-08 LAB — CBC
MCV: 75.4 fL — ABNORMAL LOW (ref 78.0–100.0)
Platelets: 569 10*3/uL — ABNORMAL HIGH (ref 150–400)
RBC: 4.03 MIL/uL (ref 3.87–5.11)
WBC: 22.4 10*3/uL — ABNORMAL HIGH (ref 4.0–10.5)

## 2010-09-09 DIAGNOSIS — D649 Anemia, unspecified: Secondary | ICD-10-CM

## 2010-09-09 DIAGNOSIS — I809 Phlebitis and thrombophlebitis of unspecified site: Secondary | ICD-10-CM

## 2010-09-09 LAB — CBC
MCHC: 29.3 g/dL — ABNORMAL LOW (ref 30.0–36.0)
Platelets: 548 10*3/uL — ABNORMAL HIGH (ref 150–400)
RDW: 20.5 % — ABNORMAL HIGH (ref 11.5–15.5)

## 2010-09-09 LAB — BASIC METABOLIC PANEL
CO2: 19 mEq/L (ref 19–32)
Calcium: 7.4 mg/dL — ABNORMAL LOW (ref 8.4–10.5)
Chloride: 104 mEq/L (ref 96–112)
Creatinine, Ser: 0.63 mg/dL (ref 0.4–1.2)
Creatinine, Ser: 0.57 mg/dL (ref 0.4–1.2)
GFR calc Af Amer: >60 mL/min (ref >60)
GFR calc Af Amer: >60 mL/min (ref >60)
GFR calc non Af Amer: >60 mL/min (ref >60)
Potassium: 3.6 mEq/L (ref 3.5–5.1)
Sodium: 131 mEq/L — ABNORMAL LOW (ref 135–145)

## 2010-09-09 LAB — CARDIAC PANEL(CRET KIN+CKTOT+MB+TROPI)
CK, MB: 2.6 ng/mL (ref 0.3–4.0)
Relative Index: INVALID (ref 0.0–2.5)
Relative Index: INVALID (ref 0.0–2.5)
Total CK: 79 U/L (ref 7–177)
Troponin I: <0.3 ng/mL (ref <0.30)
Troponin I: <0.3 ng/mL (ref <0.30)

## 2010-09-09 LAB — GLUCOSE, CAPILLARY: Glucose-Capillary: 103 mg/dL — ABNORMAL HIGH (ref 70–99)

## 2010-09-10 ENCOUNTER — Inpatient Hospital Stay (HOSPITAL_COMMUNITY): Payer: Medicare Other

## 2010-09-10 LAB — BASIC METABOLIC PANEL
Calcium: 7.2 mg/dL — ABNORMAL LOW (ref 8.4–10.5)
Chloride: 102 mEq/L (ref 96–112)
Creatinine, Ser: 0.48 mg/dL (ref 0.4–1.2)
GFR calc non Af Amer: >60 mL/min (ref >60)
Potassium: 3.7 mEq/L (ref 3.5–5.1)

## 2010-09-10 LAB — GLUCOSE, CAPILLARY: Glucose-Capillary: 123 mg/dL — ABNORMAL HIGH (ref 70–99)

## 2010-09-11 LAB — CBC
MCV: 77.3 fL — ABNORMAL LOW (ref 78.0–100.0)
Platelets: 567 10*3/uL — ABNORMAL HIGH (ref 150–400)
RDW: 21.1 % — ABNORMAL HIGH (ref 11.5–15.5)
WBC: 15.3 10*3/uL — ABNORMAL HIGH (ref 4.0–10.5)

## 2010-09-11 LAB — BASIC METABOLIC PANEL
BUN: 5 mg/dL — ABNORMAL LOW (ref 6–23)
GFR calc Af Amer: >60 mL/min (ref >60)
GFR calc non Af Amer: >60 mL/min (ref >60)
Potassium: 3.5 mEq/L (ref 3.5–5.1)
Sodium: 135 mEq/L (ref 135–145)

## 2010-09-11 LAB — VITAMIN B12: Vitamin B-12: 918 pg/mL — ABNORMAL HIGH (ref 211–911)

## 2010-09-11 LAB — LACTIC ACID, PLASMA: Lactic Acid, Venous: <0.2 mmol/L — ABNORMAL LOW (ref 0.5–2.2)

## 2010-09-12 LAB — BASIC METABOLIC PANEL
Calcium: 7.4 mg/dL — ABNORMAL LOW (ref 8.4–10.5)
Chloride: 106 mEq/L (ref 96–112)
Creatinine, Ser: <0.47 mg/dL (ref 0.4–1.2)
Sodium: 135 mEq/L (ref 135–145)

## 2010-09-12 LAB — CBC
MCH: 22.7 pg — ABNORMAL LOW (ref 26.0–34.0)
Platelets: 648 10*3/uL — ABNORMAL HIGH (ref 150–400)
RBC: 4.05 MIL/uL (ref 3.87–5.11)

## 2010-09-13 LAB — CBC
Platelets: 734 10*3/uL — ABNORMAL HIGH (ref 150–400)
RBC: 3.97 MIL/uL (ref 3.87–5.11)
WBC: 14 10*3/uL — ABNORMAL HIGH (ref 4.0–10.5)

## 2010-09-13 LAB — BASIC METABOLIC PANEL
CO2: 17 mEq/L — ABNORMAL LOW (ref 19–32)
Calcium: 7.1 mg/dL — ABNORMAL LOW (ref 8.4–10.5)
Sodium: 138 mEq/L (ref 135–145)

## 2010-09-14 DIAGNOSIS — D649 Anemia, unspecified: Secondary | ICD-10-CM

## 2010-09-14 LAB — CBC
Hemoglobin: 9.2 g/dL — ABNORMAL LOW (ref 12.0–15.0)
RBC: 4.06 MIL/uL (ref 3.87–5.11)

## 2010-09-14 LAB — BASIC METABOLIC PANEL
CO2: 20 mEq/L (ref 19–32)
Calcium: 7.2 mg/dL — ABNORMAL LOW (ref 8.4–10.5)
Chloride: 108 mEq/L (ref 96–112)
Sodium: 137 mEq/L (ref 135–145)

## 2010-09-14 LAB — POCT OCCULT BLOOD STOOL (DEVICE): Fecal Occult Bld: POSITIVE

## 2010-09-14 NOTE — Consult Note (Signed)
NAMEJORDYNE, [name]                ACCOUNT NO.:  [id number]  MEDICAL RECORD NO.:  [id number]           PATIENT TYPE:  I  LOCATION:  1412                         FACILITY:  [facility]  PHYSICIAN:  Quita Skye. Hart Rochester, M.D.  DATE OF BIRTH:  02-21-1933  DATE OF CONSULTATION:  09/08/2010 DATE OF DISCHARGE:                                CONSULTATION   CHIEF COMPLAINT:  Abdominal pain with thrombus in descending thoracic aorta and splenic infarction, recent DVT, left upper extremity.  HISTORY OF PRESENT ILLNESS:  This 77 year old female has multiple medical problems including a known history of myeloproliferative disorder.  She recently had a diagnosis of left upper extremity DVT and has been treated with Lovenox and sees Dr. Elnita Maxwell for this problem.  The patient has also had ischemic colitis in April 11, came to the emergency room with nausea, vomiting, continuous diarrhea on May 21.  Since her hospitalization, she has had improvement in her pain and diarrhea.  She had a CT scan performed with a CT angiogram which I have reviewed and this reveals patchy infarcts in the spleen as well as some mural thrombus in the descending thoracic aorta, which extends down to the diaphragm over about a 6 cm length which is nonocclusive.  There is no evidence of thrombus in the splenic artery itself or other intra- abdominal vessels.  CHRONIC MEDICAL PROBLEMS: 1. Myeloproliferative disorder. 2. Upper extremity DVT, left leg. 3. History of splenic infarct. 4. Negative coronary artery disease, diabetes or stroke.  SOCIAL HISTORY:  The patient denies any alcohol, IV drug, or tobacco use.  FAMILY HISTORY:  Negative for coronary artery disease, diabetes and stroke.  REVIEW OF SYSTEMS:  Positive mainly for generalized weakness and tiredness as well as GI complaints as noted above.  PHYSICAL EXAMINATION:  VITAL SIGNS:  Blood pressure 103/60, heart rate 110, respirations 20, temperature 98. GENERAL:  She  is chronically ill-appearing female in no apparent distress.  Alert and oriented x3. HEENT:  Normal for age.  EOMs intact.  Dentition is poor. RESPIRATORY:  No rhonchi or wheezing. CARDIOVASCULAR:  Regular rhythm with no murmurs.  Carotid pulses 3+.  No audible bruits. ABDOMEN:  Soft with some mild tenderness which is diffuse and not localized to any quadrant.  There is no rebound tenderness. LOWER EXTREMITIES:  Reveals 3+ femoral and popliteal pulses bilaterally with well-perfused lower extremities.  She does have arthritic changes in her hands.  LABORATORY DATA DURING THIS ADMISSION:  Includes a white count a 53,000 with a left shift as well as a platelet count 591,000 and INR of 1.29, PTT of 30.  IMPRESSION: 1. Generalized abdominal symptoms secondary to diffuse colitis with     history of splenic infarct. 2. Thrombus and thoracic aorta, probably also related to her     myeloproliferative disorder and thrombocytopenia, but not in     continuity with the splenic infarct.  RECOMMENDATIONS:  There is no role for vascular surgery in this patient unless she should develop limb threatening ischemia or significant thrombotic occlusion of mesenteric or renal vessels or life-threatening areas.  Would continue anticoagulation as outlined by Dr.  Cheryl. Will see again on a p.r.n. basis.     Quita Skye Hart Rochester, M.D.     JDL/MEDQ  D:  09/08/2010  T:  09/09/2010  Job:  [id number]  Electronically Signed by Josephina Gip M.D. on 09/14/2010 09:34:52 AM

## 2010-09-15 ENCOUNTER — Inpatient Hospital Stay (HOSPITAL_COMMUNITY): Payer: Medicare Other

## 2010-09-15 LAB — BASIC METABOLIC PANEL
BUN: 3 mg/dL — ABNORMAL LOW (ref 6–23)
Calcium: 7.1 mg/dL — ABNORMAL LOW (ref 8.4–10.5)
Glucose, Bld: 85 mg/dL (ref 70–99)

## 2010-09-16 LAB — CBC
MCHC: 29.6 g/dL — ABNORMAL LOW (ref 30.0–36.0)
RDW: 22.2 % — ABNORMAL HIGH (ref 11.5–15.5)

## 2010-09-16 LAB — COMPREHENSIVE METABOLIC PANEL
ALT: 6 U/L (ref 0–35)
AST: 14 U/L (ref 0–37)
Calcium: 7.3 mg/dL — ABNORMAL LOW (ref 8.4–10.5)
Glucose, Bld: 90 mg/dL (ref 70–99)
Sodium: 137 mEq/L (ref 135–145)
Total Protein: 4.8 g/dL — ABNORMAL LOW (ref 6.0–8.3)

## 2010-09-16 LAB — LIPASE, BLOOD: Lipase: 11 U/L (ref 11–59)

## 2010-09-16 NOTE — H&P (Signed)
[name], [name]                ACCOUNT NO.:  [id number]  MEDICAL RECORD NO.:  [id number]           PATIENT TYPE:  I  LOCATION:  1412                         [facility]ACILITY:  [facility]  PHYSICIAN:  Lonia Blood, M.D.      DATE OF BIRTH:  08-16-1932  DATE OF ADMISSION:  09/06/2010 DATE OF DISCHARGE:                             HISTORY & PHYSICAL   PRIMARY CARE PHYSICIAN:  [facility].  PRESENTING COMPLAINT:  Abdominal pain.  HISTORY OF PRESENT ILLNESS:  The patient is a 77 year old female with multiple medical problems including known history of myeloproliferative disorder and recent diagnosis of left upper extremity DVT.  The patient had ischemic colitis in April of this year.  She came to the ED on the day of admission with nausea, vomiting, and continuous diarrhea.  She has had at least 10 episodes of diarrhea in 1 day, also some vomiting and she became so dehydrated.  The patient's pain was rated as 8/10, persistent, not getting any better with treatment.  She was also not keeping fluids down, so she came to the emergency room for further treatment.  PAST MEDICAL HISTORY:  Her past medical history is significant for thrombocytosis from myeloproliferative disorder, upper extremity DVT recently, history of splenic infarct, anemia, tachycardia, ischemic colitis, history of leukocytosis.  ALLERGIES:  She is allergic to CIPROFLOXACIN.  CURRENT MEDICATIONS:  Methadone 50 mg daily, metoprolol 25 mg b.i.d., Protonix 80 mg every 12 hours, oxycodone 40 mg hourly, sertraline 100 mg daily, hydrocodone and acetaminophen 1 tablet q.6 h. p.r.n., loperamide 2 mg p.r.n., Compazine 2 mg q.6 h. p.r.n.  SOCIAL HISTORY:  The patient denied any alcohol, IV drug use, or any tobacco use.  FAMILY HISTORY:  Noncontributory.  REVIEW OF SYSTEMS:  Mainly generalized weakness and tiredness. Otherwise, all systems reviewed are negative except per HPI.  PHYSICAL EXAMINATION:  VITAL SIGNS:  On  exam, temperature is 98.7, blood pressure 103/63 with pulse 107, respiratory rate 16, sats 93% on room air. GENERAL:  Generally, the patient is awake, alert, oriented.  She seems to be in acute distress. HEENT:  PERRL.  EOMI.  No pallor, no jaundice.  No rhinorrhea. NECK:  Supple.  No JVD, no lymphadenopathy. RESPIRATORY:  She has good air entry bilaterally.  No wheezes, no rales, no crackles. CARDIOVASCULAR SYSTEM:  She has S1, S2.  No audible murmur. ABDOMEN:  Her abdomen is soft, full with some diffuse tenderness, but not bloated, not distended. EXTREMITIES:  No edema, cyanosis, clubbing. SKIN EXAM:  No rashes or ulcer. MUSCULOSKELETAL:  No joint swelling or tenderness.  LABORATORY DATA:  PT 16.3, INR 1.29, PTT of 30.  White count is 53,600 with left shift, ANC of 50.9, hemoglobin is 9.7 with a platelet count of 591.  Sodium is 127, potassium 4.5, chloride 92, CO2 of 22, glucose 172, BUN 18, creatinine 0.80, total bilirubin 0.2, alkaline phosphatase 129, AST 18, ALT 10, albumin is only 1.7, calcium 8.5, total protein 6.0. Her lipase is 7.  Lactic acid level is 2.1.  Urinalysis showed amber clear urine with evidence of constant overconcentration, small bilirubin, some blood, and  protein.  Urine microscopy essentially negative.  CT angiogram of the chest showed extensive mural thrombus within the descending thoracic aorta, which is nonocclusive, but no evidence of PE.  There are atherosclerotic changes in her coronary arteries, small right pleural effusion with associated atelectasis.  CT abdomen showed questionable mild stenosis of the inferior mesenteric artery without occlusive disease.  The superior mesenteric artery is within normal limits.  There is extensive spine surgery, stable cysts and calcification within the liver with progressive splenic infarcts. CT of pelvis showed diffuse colitis.  No significant anterior stenosis or occlusive disease to account for the colitis,  probably infectious versus pseudomembranous colitis.  She is status post hysterectomy.  PLAN: 1. Diffuse colitis, more than likely ischemic, but infectious colitis     cannot be ruled out.  We will admit the patient, put her on bowel     rest, pain control, control her nausea and vomiting.  We will get     stools for C difficile and I will empirically start her on Flagyl.     She is allergic to Cipro, hold her with some quinolones.  We will     get GI consult again, Dr. Bosie Clos has seen her in the past.  In     the meantime, we will give her IV fluids using D5 normal for now. 2. Leukocytosis probably from her lymphoproliferative disorder.  We     will consider consulting Dr. Truett Perna again to follow up on these.     The patient may be having a flare or this could all be secondary to     her acute disease, especially if it is infectious. 3. Splenic infarcts.  This is getting progressive.  The patient is     currently on Coumadin and we will continue with that, but pain     control will be the issue.  Again, if the patient is having lot of     ischemic issues intra-abdominally, these will be addressed     accordingly. 4. Persistent tachycardia.  Again, this is chronic and was seen in     April when the patient was in the hospital, probably related to     dehydration or acute disease. 5. Chronic anemia.  This is anemia of chronic disease also, but may be     related to myelodysplastic disease or rather myeloproliferative     disease.  Other than that, most of her other medical problems are     chronic and seems somewhat stable at this point.     Lonia Blood, M.D.     Verlin Grills  D:  09/07/2010  T:  09/07/2010  Job:  [id number]  Electronically Signed by Lonia Blood M.D. on 09/16/2010 11:55:01 AM

## 2010-09-17 DIAGNOSIS — D649 Anemia, unspecified: Secondary | ICD-10-CM

## 2010-09-17 LAB — BASIC METABOLIC PANEL
Glucose, Bld: 112 mg/dL — ABNORMAL HIGH (ref 70–99)
Potassium: 3.9 mEq/L (ref 3.5–5.1)
Sodium: 136 mEq/L (ref 135–145)

## 2010-09-19 LAB — BASIC METABOLIC PANEL
CO2: 27 mEq/L (ref 19–32)
Calcium: 7.5 mg/dL — ABNORMAL LOW (ref 8.4–10.5)
Chloride: 106 mEq/L (ref 96–112)
Glucose, Bld: 101 mg/dL — ABNORMAL HIGH (ref 70–99)
Potassium: 4.2 mEq/L (ref 3.5–5.1)
Sodium: 136 mEq/L (ref 135–145)

## 2010-09-19 LAB — CBC
HCT: 24.1 % — ABNORMAL LOW (ref 36.0–46.0)
Hemoglobin: 7.3 g/dL — ABNORMAL LOW (ref 12.0–15.0)
MCHC: 30.3 g/dL (ref 30.0–36.0)
RBC: 3.05 MIL/uL — ABNORMAL LOW (ref 3.87–5.11)

## 2010-09-19 NOTE — Consult Note (Signed)
[name], [name]                ACCOUNT NO.:  [id number]  MEDICAL RECORD NO.:  [id number]           PATIENT TYPE:  LOCATION:                                 FACILITY:  PHYSICIAN:  Loi Rennaker L. Malon Kindle., M.D.DATE OF BIRTH:  10-Sep-1932  DATE OF CONSULTATION:  09/10/2010 DATE OF DISCHARGE:                                CONSULTATION   REASON FOR CONSULTATION:  Abdominal pain, C difficile, and colon polyps.  HISTORY:  The patient is a 77 year old woman who is quite complicated. She was hospitalized back in April with diarrhea, abdominal pain, CT showed colitis.  She was seen in consultation by Dr. Bosie Clos with this nausea, vomiting, abdominal pain.  She had had some watery diarrhea. She did receive some antibiotics on that admission, although I cannot actually get to what it was that she received.  She did receive Flagyl and Zosyn at least partially during that hospitalization.  Her stools were negative.  Stool cultures and C difficile and all that were negative at that time.  CT scan showed mild wall thickening of the descending colon and sigmoid and question of the splenic infarct.  She had the EGD, which showed a small stricture versus diverticulum otherwise was normal and had colonoscopy showed a long tortuous colon, but no gross colitis or signs of C difficile and several polyps.  There were 2 polyps removed.  They were approximately 1 to 1.5 cm and apparently, there was a very large sigmoid pedunculated polyp that was felt to be likely malignant.  Dr. Bosie Clos biopsied this and it proved to be benign by biopsy and it was injected with [place] ink.  The cause of the patient's thickened colon and diarrhea were not clear, but she did improve.  Again, the colonoscopy did not show any obvious colitis or any other gross abnormalities other than the above-mentioned polyps.  The patient was discharged home and at the time of discharge, her diarrhea was improved.  Her other significant  problems were new splenic infarcts and DVT of the left upper extremity and a history of chronic myeloproliferative disorder.  Her medications at discharge were listed in the medicine reconciliation sheet and were not placed in the discharge summary and we are unable at this point to determine.  On admission, here again, she was on oxycodone, methadone, sertraline, hydrocodone, loperamide, Compazine, Protonix, as well as metoprolol. The patient states that she went home and per 3 weeks had normal bowel movements.  She does have chronic pain and is on chronic narcotics for this pain.  She says that she began to have nausea, vomiting, abdominal pain, and loose stools again.  She came to the emergency room with these symptoms and at that time was having continuous diarrhea with least 10 loose bowel movements a day with several accidents and several nocturnal stools was felt to be dehydrated.  Her workup here has included a CT angiography the chest, abdomen, and pelvis, which showed no evidence of pulmonary emboli and stenosis of the IMA without occlusive disease.  The SMA was normal.  There was a marked inflammation of the colon that was  not localized and appeared to be fairly diffuse.  The patient had the usual labs and these resulted in the finding of a white count of 53.4 thousand, hemoglobin 9.7, and positive C difficile toxin.  She has been started on Flagyl and oral vancomycin.  We are called to see her because she is still having some abdominal pain.  The patient notes that her nausea is completely resolved since coming in the hospital and receiving fluids CT medication.  She is tolerating clear liquids.  She says she is still having loose stools, but is down now from 10 to 12 a day to about 4 a day and her abdominal pain is 50% better.  She has not seen any blood in the stools, but her stools were still loose.  It is also notable that her white count has dropped from 53.4 to 17.9.   Overall, she says she feels about 30% to 40% better than her admission.  CURRENT MEDICATIONS: 1. Aspirin. 2. Lovenox. 3. Iron. 4. Neurontin. 5. Metoprolol. 6. Flagyl. 7. Oxycodone. 8. Florastor. 9. Sertraline. 10.Zocor. 11.Vancomycin 125 mg q.i.d. 12.Ondansetron. 13.Atrovent. 14.Ultram.  ALLERGIES:  She is allergic to CIPRO.  PAST MEDICAL HISTORY: 1. The patient has a history of myeloproliferative disorder, for which     she has seen Dr. Truett Perna.  He did not recommend any other therapy     other than treating her C difficile at this time. 2. She has had severe iron deficiency. 3. She has had chronic back pain. 4. She also has a history of tachycardia, leukocytosis, reflux.  PAST SURGICAL HISTORY:  Appendectomy, hysterectomy, and back surgeries. Recent colonoscopy revealed polyps removed with 1 large sigmoid polyp is apparently still in place.  SOCIAL HISTORY:  She does not smoke or drink.  She lives independently with a caretaker, but no longer drives.  PHYSICAL EXAMINATION:  VITAL SIGNS:  Temperature 98.6, pulse 80, blood pressure 142/78, O2 saturation 98%. GENERAL:  Alert, white female, in no acute distress. EYES:  Sclerae nonicteric. HEART:  Regular rate and rhythm without murmurs or gallops. LUNGS:  Clear. ABDOMEN:  Nondistended and soft with some mild periumbilical tenderness. No rebound or rigidity.  Bowel sounds are normal.  ASSESSMENT: 1. Diffuse colitis secondary to C difficile.  The patient has     continued to have loose stools, but has only been on vancomycin and     Flagyl now for 4 days and she admits that she is better.  Another     option will be to treat her with Dificid; however, since she is     doing well with vancomycin now, I think I would discontinue this. 2. Colon polyps, the sigmoid colon - this was biopsied and was benign,     but will need to be removed.  I will need to check with Dr.     Bosie Clos about this could likely be done as an  outpatient. 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 4. Myeloproliferative disorder. 5. Chronic back pain.  PLAN:  We will continue to follow with you and at this point, we will continue on vancomycin, Flagyl, and Florastor with switch to Dificid if her symptoms do not continue to improve.          ______________________________ Llana Aliment. Malon Kindle., M.D.     Waldron Session  D:  09/10/2010  T:  09/11/2010  Job:  [id number]  cc:   Leighton Roach  Truett Perna, M.D. Fax: [phone]  Deboraha Sprang at [facility]  Electronically Signed by Carman Ching M.D. on 09/19/2010 08:29:14 PM

## 2010-09-20 LAB — CBC
MCH: 24.6 pg — ABNORMAL LOW (ref 26.0–34.0)
Platelets: 821 10*3/uL — ABNORMAL HIGH (ref 150–400)
RBC: 4.22 MIL/uL (ref 3.87–5.11)
RDW: 22.5 % — ABNORMAL HIGH (ref 11.5–15.5)
WBC: 10.6 10*3/uL — ABNORMAL HIGH (ref 4.0–10.5)

## 2010-09-20 LAB — COMPREHENSIVE METABOLIC PANEL
AST: 15 U/L (ref 0–37)
Albumin: 1.5 g/dL — ABNORMAL LOW (ref 3.5–5.2)
BUN: 4 mg/dL — ABNORMAL LOW (ref 6–23)
Chloride: 102 mEq/L (ref 96–112)
Creatinine, Ser: <0.47 mg/dL (ref 0.4–1.2)
Potassium: 4.2 mEq/L (ref 3.5–5.1)
Total Bilirubin: 0.1 mg/dL — ABNORMAL LOW (ref 0.3–1.2)
Total Protein: 5.4 g/dL — ABNORMAL LOW (ref 6.0–8.3)

## 2010-09-21 LAB — CBC
HCT: 28.5 % — ABNORMAL LOW (ref 36.0–46.0)
Hemoglobin: 8.6 g/dL — ABNORMAL LOW (ref 12.0–15.0)
MCV: 84.1 fL (ref 78.0–100.0)
RBC: 3.39 MIL/uL — ABNORMAL LOW (ref 3.87–5.11)
WBC: 26 10*3/uL — ABNORMAL HIGH (ref 4.0–10.5)

## 2010-09-21 LAB — DIFFERENTIAL
Basophils Relative: 1 % (ref 0–1)
Eosinophils Relative: 0 % (ref 0–5)
Monocytes Absolute: 2.3 10*3/uL — ABNORMAL HIGH (ref 0.1–1.0)
Monocytes Relative: 9 % (ref 3–12)
Neutrophils Relative %: 38 % — ABNORMAL LOW (ref 43–77)

## 2010-09-21 LAB — HEMOGLOBIN AND HEMATOCRIT, BLOOD
HCT: 34.5 % — ABNORMAL LOW (ref 36.0–46.0)
HCT: 32.4 % — ABNORMAL LOW (ref 36.0–46.0)
Hemoglobin: 11.2 g/dL — ABNORMAL LOW (ref 12.0–15.0)
Hemoglobin: 10.4 g/dL — ABNORMAL LOW (ref 12.0–15.0)

## 2010-09-21 LAB — BASIC METABOLIC PANEL
BUN: 23 mg/dL (ref 6–23)
CO2: 25 mEq/L (ref 19–32)
Chloride: 103 mEq/L (ref 96–112)
GFR calc non Af Amer: >60 mL/min (ref >60)
Glucose, Bld: 164 mg/dL — ABNORMAL HIGH (ref 70–99)
Potassium: 5 mEq/L (ref 3.5–5.1)
Sodium: 138 mEq/L (ref 135–145)

## 2010-09-21 LAB — PATHOLOGIST SMEAR REVIEW

## 2010-09-22 ENCOUNTER — Inpatient Hospital Stay (HOSPITAL_COMMUNITY): Payer: Medicare Other

## 2010-09-22 LAB — BASIC METABOLIC PANEL WITH GFR
BUN: 13 mg/dL (ref 6–23)
CO2: 26 meq/L (ref 19–32)
Calcium: 7.2 mg/dL — ABNORMAL LOW (ref 8.4–10.5)
Chloride: 109 meq/L (ref 96–112)
Creatinine, Ser: <0.47 mg/dL (ref 0.4–1.2)
Glucose, Bld: 86 mg/dL (ref 70–99)
Potassium: 4.3 meq/L (ref 3.5–5.1)
Sodium: 138 meq/L (ref 135–145)

## 2010-09-22 LAB — CROSSMATCH
ABO/RH(D): O POS
Antibody Screen: NEGATIVE
Unit division: 0
Unit division: 0
Unit division: 0
Unit division: 0

## 2010-09-22 LAB — CBC
HCT: 30.2 % — ABNORMAL LOW (ref 36.0–46.0)
MCHC: 31.8 g/dL (ref 30.0–36.0)
Platelets: 608 10*3/uL — ABNORMAL HIGH (ref 150–400)
RDW: 20.7 % — ABNORMAL HIGH (ref 11.5–15.5)
WBC: 8 10*3/uL (ref 4.0–10.5)

## 2010-09-22 LAB — HEMOGLOBIN AND HEMATOCRIT, BLOOD: Hemoglobin: 9.1 g/dL — ABNORMAL LOW (ref 12.0–15.0)

## 2010-09-23 LAB — BASIC METABOLIC PANEL
CO2: 26 mEq/L (ref 19–32)
Chloride: 105 mEq/L (ref 96–112)
Creatinine, Ser: <0.47 mg/dL (ref 0.4–1.2)
Glucose, Bld: 85 mg/dL (ref 70–99)

## 2010-09-23 LAB — CBC
HCT: 29.3 % — ABNORMAL LOW (ref 36.0–46.0)
Hemoglobin: 8.9 g/dL — ABNORMAL LOW (ref 12.0–15.0)
MCH: 26.4 pg (ref 26.0–34.0)
RBC: 3.37 MIL/uL — ABNORMAL LOW (ref 3.87–5.11)

## 2010-09-23 LAB — MRSA CULTURE

## 2010-09-23 LAB — HEMOGLOBIN AND HEMATOCRIT, BLOOD
HCT: 29.7 % — ABNORMAL LOW (ref 36.0–46.0)
Hemoglobin: 9 g/dL — ABNORMAL LOW (ref 12.0–15.0)

## 2010-09-24 ENCOUNTER — Inpatient Hospital Stay (HOSPITAL_COMMUNITY): Payer: Medicare Other

## 2010-09-24 LAB — HEMOGLOBIN AND HEMATOCRIT, BLOOD
HCT: 32.2 % — ABNORMAL LOW (ref 36.0–46.0)
HCT: 28.4 % — ABNORMAL LOW (ref 36.0–46.0)
Hemoglobin: 9.8 g/dL — ABNORMAL LOW (ref 12.0–15.0)
Hemoglobin: 8.6 g/dL — ABNORMAL LOW (ref 12.0–15.0)

## 2010-09-24 LAB — BASIC METABOLIC PANEL
Chloride: 108 mEq/L (ref 96–112)
GFR calc Af Amer: >60 mL/min (ref >60)
Potassium: 4.5 mEq/L (ref 3.5–5.1)

## 2010-09-24 LAB — CBC
MCV: 87.4 fL (ref 78.0–100.0)
Platelets: 511 10*3/uL — ABNORMAL HIGH (ref 150–400)
RBC: 3.34 MIL/uL — ABNORMAL LOW (ref 3.87–5.11)
WBC: 6.3 10*3/uL (ref 4.0–10.5)

## 2010-09-25 LAB — HEMOGLOBIN AND HEMATOCRIT, BLOOD
HCT: 30 % — ABNORMAL LOW (ref 36.0–46.0)
Hemoglobin: 9.2 g/dL — ABNORMAL LOW (ref 12.0–15.0)

## 2010-09-26 LAB — CBC
HCT: 30.8 % — ABNORMAL LOW (ref 36.0–46.0)
Hemoglobin: 9.2 g/dL — ABNORMAL LOW (ref 12.0–15.0)
RDW: 20.3 % — ABNORMAL HIGH (ref 11.5–15.5)
WBC: 7.4 10*3/uL (ref 4.0–10.5)

## 2010-09-26 LAB — BASIC METABOLIC PANEL
BUN: 5 mg/dL — ABNORMAL LOW (ref 6–23)
Chloride: 104 mEq/L (ref 96–112)
GFR calc Af Amer: >60 mL/min (ref >60)
Glucose, Bld: 85 mg/dL (ref 70–99)
Potassium: 4.3 mEq/L (ref 3.5–5.1)

## 2010-09-27 LAB — CBC
HCT: 31.6 % — ABNORMAL LOW (ref 36.0–46.0)
Hemoglobin: 9.5 g/dL — ABNORMAL LOW (ref 12.0–15.0)
MCHC: 30.1 g/dL (ref 30.0–36.0)

## 2010-09-27 LAB — PROTIME-INR: INR: 1.18 (ref 0.00–1.49)

## 2010-09-28 LAB — CBC
HCT: 31.2 % — ABNORMAL LOW (ref 36.0–46.0)
Hemoglobin: 9.3 g/dL — ABNORMAL LOW (ref 12.0–15.0)
MCHC: 29.8 g/dL — ABNORMAL LOW (ref 30.0–36.0)
RBC: 3.58 MIL/uL — ABNORMAL LOW (ref 3.87–5.11)

## 2010-09-28 LAB — PROTIME-INR: INR: 1.28 (ref 0.00–1.49)

## 2010-09-28 LAB — IRON: Iron: 12 ug/dL — ABNORMAL LOW (ref 42–135)

## 2010-09-29 LAB — CBC
Hemoglobin: 9.7 g/dL — ABNORMAL LOW (ref 12.0–15.0)
MCH: 26.6 pg (ref 26.0–34.0)
RBC: 3.64 MIL/uL — ABNORMAL LOW (ref 3.87–5.11)
WBC: 6.7 10*3/uL (ref 4.0–10.5)

## 2010-09-29 LAB — PROTIME-INR
INR: 1.68 — ABNORMAL HIGH (ref 0.00–1.49)
Prothrombin Time: 20 seconds — ABNORMAL HIGH (ref 11.6–15.2)

## 2010-09-29 NOTE — Discharge Summary (Signed)
[name], [name] NO.:  [id number]  MEDICAL RECORD NO.:  [id number]  LOCATION:  1503                         FACILITY:  [facility]  PHYSICIAN:  Talmage Nap, MD  DATE OF BIRTH:  01/13/1933  DATE OF ADMISSION:  09/06/2010 DATE OF DISCHARGE:  09/29/2010                        DISCHARGE SUMMARY - REFERRING   PRIMARY CARE PHYSICIAN:  [facility].  The patient was seen by me, Dr. Beverly Gust for the first time today, which is September 29, 2010.  DISCHARGE DIAGNOSES: 1. Upper gastrointestinal bleed - resolved. 2. Ulcerative esophagitis. 3. Iron deficiency anemia. 4. Clostridium difficile colitis. 5. Hospital-acquired pneumonia. 6. Mural thrombus, descending thoracic aorta. 7. Deep venous thrombosis, left lower extremity. 8. Thrombocytosis most likely secondary to iron deficiency anemia. 9. Myeloproliferative disorder. 10.Deconditioning.  For hospital course on this patient, please see progress note dictated by Dr. Eda Paschal on September 28, 2010.  The patient was however, seen by me for the very first time in this admission, which was today, September 29, 2010, and during my encounter, the patient initially declined to go to nursing home, but after completing, she was then agreeable.  She denied any specific complaint.  Examination of the patient was essentially unremarkable.  Her vital signs, blood pressure is 103/67, pulse 87, respiratory rate 16, and temperature is 97.5.  LABORATORY DATA PRIOR TO DISCHARGE:  Coagulation profile, which showed PT 20.0 and INR 1.68.  Complete blood count with differential showed WBC of 6.7, hemoglobin of 9.7, hematocrit of 31.6, MCV 86.8 with a platelet count of 717.  The patient is medically stable.  Plan is for the patient to be discharged to the nursing home today on activity as tolerated.  DIET:  Cardiac prudent.  He will also have physical therapy done.  Medications to be taken at the facility will include, 1.  Augmentin 500 mg p.o. b.i.d. for the next 5 days. 2. Chlorhexidine 0.2% as Peridex 15 cc rinse twice daily. 3. Ensure (Ensure chocolate liquid 237 mL) liquid 237 mL by mouth     p.r.n. 4. Zofran 4 mg 1 p.o. t.i.d. p.r.n. 5. Potassium chloride 20 mEq 1 p.o. b.i.d. 6. Promethazine 12.5 mg half tablet p.o. q.6 h. p.r.n. 7. Saccharomyces boulardii 250 mg capsule as Florastor 2 tablets p.o.     b.i.d. 8. Carafate 1 g p.o. t.i.d. with meals. 9. Vancomycin 250 mg 1 p.o. b.i.d. for the next 7 days. 10.Warfarin per protocol. 11.Aspirin 81 mg 1 p.o. daily. 12.Ferrous sulfate 1 tablet p.o. b.i.d. 13.Loperamide 2 mg 1 p.o. q.6 h. p.r.n. 14.Lovenox 100 mg subcutaneously daily. 15.Metoprolol tartrate 25 mg 1 p.o. b.i.d. 16.Multivitamins 1 tablet p.o. daily. 17.Neurontin (gabapentin 200 mg 1 p.o. b.i.d. 18.OxyContin (oxycodone CR 40 mg one p.o. q.8 h.). 19.Protonix (pantoprazole 40 mg 1 p.o. b.i.d.). 20.Questran Light as cholestyramine/aspartate 1 packet t.i.d. with     meals. 21.Zoloft (essentially 100 mg 1 p.o. daily).  A copy of this discharge     summary made available to the discharge patient's primary care     physician.     Talmage Nap, MD     CN/MEDQ  D:  09/29/2010  T:  09/29/2010  Job:  [id number]  Electronically  Signed by Talmage Nap  on 09/29/2010 07:32:13 PM

## 2010-09-29 NOTE — Group Therapy Note (Addendum)
NAMEMarland [name]  GUSTA, MARKSBERRY NO.:  [id number]  MEDICAL RECORD NO.:  [id number]  LOCATION:                                 FACILITY:  PHYSICIAN:  Andreas Blower, MD       DATE OF BIRTH:  1932-06-14                                PROGRESS NOTE   PRIMARY CARE PROVIDER: Deboraha Sprang at [facility].  CURRENT DIAGNOSES: 1. Gastrointestinal bleed secondary to ulcerative esophagitis. 2. Acute blood loss anemia secondary to gastrointestinal bleed. 3. C difficile colitis. 4. Hyponatremia. 5. Mural thrombus within descending thoracic aorta. 6. Microcytic anemia. 7. Mild rhabdomyolysis. 8. Recent left upper extremity deep venous thrombosis, on Lovenox. 9. Thrombocytosis. 10.Chronic leukocytosis. 11.Hypokalemia. 12.Metabolic acidosis.  DISCHARGE MEDICATIONS: To be dictated at this time of discharge.  LABORATORY DATA: WBCs 53.6, hemoglobin 9.7, hematocrit 30.2, platelets 591, neutrophils 95%, absolute neutrophils 50.9.  Sodium 127, potassium 4.5, chloride 92, CO2 of 22, BUN 18, creatinine 0.8, glucose 172, lipase 7, PTT 30, PT 16.3, INR 1.29, lactic acid 2.1.  Urinalysis showed small blood, small bili, rare bacteria, 3-6 RBCs.  ProBNP was 980.8.  TSH 2.42.  Total CK 506, CK-MB 4.3, troponin I less than 0.30.  Magnesium 2.4.  C difficile by PCR was positive.  Urine culture shows no growth to date. Cholesterol 77, triglycerides 76, HDL 31, LDL 31, VLDL 15.  Fecal occult blood positive.  DIAGNOSTIC IMAGING: Acute abdominal series on May 30th yields questionable constipation. Bibasilar atelectasis and/or vascular crowding.  Probable tiny left pleural effusion.  No free air.  Mild gaseous prominence of small bowel with gas and a fair amount of stool.  A CT angio of the chest on May 21st yields extensive mural thrombus is present within the descending thoracic aorta.  This is nonocclusive.  No evidence for pulmonary embolus.  Atherosclerosis including coronary artery disease.  A  small right pleural effusion with associated atelectasis.  CT angio of the abdomen yields question mild stenosis of the inferior mesenteric artery without occlusive disease.  The superior mesenteric artery is within normal limits.  Extensive spine surgery.  Stable cyst and calcifications within the liver.  Progressive splenic infarcts.  CT angio of the pelvis yields diffuse colitis.  No significant arterial stenosis or occlusive disease to account for the colitis.  This is likely infectious and may represent pseudomembranous colitis.  Status post hysterectomy.  X-ray of her lumbar spine on May 26th shows stable thoracic spondylosis and scoliosis.  Stable alignment of the lumbar spine status post prior extensive fusion.  CONSULTATIONS: 1. Dr. Josephina Gip on May 24th.  Dr. Candie Chroman impression was     generalized abdominal symptoms secondary to diffuse colitis with a     history of splenic infarct.  Thrombus and thoracic aorta probably     also related to her myeloproliferative disorder and     thrombocytopenia, but not in continuity with the splenic infarct.     His recommendations yielded no role for vascular surgery in this     patient unless she should develop limb threatening ischemia or     significant thrombotic occlusion of mesenteric or renal vessels or     life-threatening areas.  He recommended continuing anticoagulation     as outlined by Dr. Elnita Maxwell. 2. Dr. Carman Ching on May 25th from Gastroenterology recommending     continuing vancomycin and Flagyl and Florastor with a switch to     Dificid if her symptoms did not continue to improve.  She completed     her course of antibiotics on June 4th.  He also indicated the     patient with colon polyps that were biopsied and were benign, but     needed to be removed indicating this could be done on an outpatient     basis.  In addition on June 5th, he did an EGD, which yielded     ulcerative esophagitis. 3. Dr. Minda Ditto  from Hematology recommended that she was stable     from hematological standpoint indicating no need for daily CBCs at     that time.  He indicated she likely has an underlying     myeloproliferative disorder; however, it is not clear whether this     has caused a thromboembolic disease.  He recommended continuing     Lovenox and aspirin.  No need for platelet lowering therapy at the     present time.  PROCEDURES: The patient had an EGD on September 21, 2010.  BRIEF HISTORY: Ms. [name] is a 77 year old female with multiple medical problems including a known history of myeloproliferative disorder and a recent diagnosis of left upper extremity DVT as well as ischemic colitis in April of this year.  She presented to [facility] ED on May 22nd with a chief complaint of nausea, vomiting, and continuous diarrhea.  She indicates that she had 10 episodes of diarrhea on the 1st day, also some vomiting, and became dehydrated.  She indicated that her abdominal pain was 8/10, was persistent, and not getting any better with treatment. She has also been able to keep food or liquids down.  Her workup in the emergency room yielded diffuse colitis with leukocytosis.  The hospitalist were asked to admit for further evaluation and treatment.  HOSPITAL COURSE BY PROBLEMS: 1. Gastrointestinal bleed secondary to ulcerative esophagitis.  The     patient developed nausea, vomiting, on June 4th.  NG tube was     inserted, which yielded 450 cc of dark emesis.  In addition, her     lab work indicated her hemoglobin had gone from 10.4 to 8.6 in a 24-     hour period.  She was transferred to the ICU, placed on a Protonix     drip, and given 2 units of packed RBCs.  GI was consulted.  Dr.     Randa Evens came in and performed an EGD, which yielded ulcerative     esophagitis.  He continued the Protonix drip and started Carafate     slurry as well as clear liquids after the procedure.  At this time,     the patient remains  on ICU in stable condition.  Hemoglobin 11.2,     status post 2 units of packed RBCs.  We will continue serial     hemoglobin and hematocrit for 24 more hours. 2. Acute blood loss anemia secondary to gastrointestinal bleed.     Status post 2 units of packed RBCs.  We will continue serial     hemoglobin and hematocrit for 24 hours.  We will provide O2 support     and monitor closely. 3. C difficile colitis.  The patient was started on vancomycin,  Flagyl, and Florastor at admission.  She completed her vancomycin     and Flagyl on June 4th.  We will continue her Florastor.  Her     progress has waxed and waned and been very slow.  Her diarrhea has     slowed down and is close to her baseline.  A diet was started for     her on May 28th, which she tolerated for a couple of days and then     developed worsening abdominal pain, nausea and vomiting after     eating on May 31st.  Phenergan was started and GI was consulted.     Her diet was downgraded to clear liquids.  At the time of this     dictation, she is back on clear liquids secondary to problem     gastrointestinal bleed and her diarrhea is much improved. 4. Hyponatremia secondary to dehydration, resolved. 5. Microcytic anemia secondary to iron deficiency and chronic disease.     See problem #1. 6. Mural thrombus and descending thoracic aorta.  CVTS was consulted.     Results as stated above. 7. Recent left upper extremity DVT.  The patient was on Lovenox before     she was admitted and had been continued on that until June 4th     secondary to gastrointestinal bleed.  At the time of this     dictation, Lovenox is on hold secondary to gastrointestinal bleed. 8. Mild rhabdomyolysis, resolved. 9. Chronic thrombocytosis and leukocytosis.  Hematology following.     Currently, recommending continuation of the iron.  No platelet     lowering agents to date. 10.Hypokalemia, resolved. 11.Metabolic acidosis resolved.  PHYSICAL  EXAMINATION: VITAL SIGNS:  Blood pressure 99/52, heart rate 99, respiration 18, sats 100% on room air. GENERAL:  Eyes closed, easily aroused, pale, no acute distress. RESPIRATORY:  Normal effort.  Breath sounds distant, but clear to auscultation bilaterally.  No wheeze, no rhonchi, no rales. ABDOMEN:  Round, soft, positive bowel sounds.  Mild tenderness throughout to palpation.  No mass or organomegaly noted. NEURO:  Arousable, oriented x3.  Speech clear.  DISPOSITION: The patient has agreed to short-term SNF placement when medically stable and ready for discharge.     Gwenyth Bender, NP   ______________________________ Andreas Blower, MD    KMB/MEDQ  D:  09/21/2010  T:  09/22/2010  Job:  [id number]  Electronically Signed by Wardell Heath REDDY  on 09/29/2010 12:29:38 PM Electronically Signed by Toya Smothers  on 10/16/2010 11:02:21 AM

## 2010-10-14 NOTE — Op Note (Signed)
[name], [name]                ACCOUNT NO.:  [id number]  MEDICAL RECORD NO.:  [id number]  LOCATION:  1226                         FACILITY:  [facility]  PHYSICIAN:  Natally Ribera L. Malon Kindle., M.D.DATE OF BIRTH:  [birth year]  DATE OF PROCEDURE:  09/21/2010 DATE OF DISCHARGE:                              OPERATIVE REPORT   PROCEDURE:  Esophagogastroduodenoscopy.  MEDICATIONS: 1. Cetacaine spray. 2. Benadryl 25 mg. 3. Fentanyl 50 mcg. 4. Versed 3 mg IV.  INDICATIONS:  This is a 77 year old woman with multiple problems.  She has DVT of her extremity, which is currently requiring Lovenox therapy. She also has a mural thrombus in her descending thoracic aorta.  She has been on Lovenox for these reasons.  She also has C difficile colitis, which has been treated and her diarrhea has actually been improving. She had a colonoscopy and attempted an upper endoscopy about a month ago by Dr. Bosie Clos and the colonoscopy was basically remarkable only for a large colon polyp that had to be left and some smaller polyps that were removed.  The upper endoscopy was attempted but they were unable to pass the scope and the patient became agitated and combative and the procedure was terminated.  During this hospitalization, she has been on proton pump inhibitors, has been complaining of upper chest pain and epigastric pain while eating.  It has gotten progressively worse over the past several days.  Originally an another endoscopy was to be attempted today using propofol anesthesia.  During the night, the patient vomited up a large quantity of blood and has had blood coming out of an NG tube that was placed.  DESCRIPTION OF PROCEDURE:  The procedure was performed in unit, propofol anesthesia was not available and we elected to go ahead and attempted to do this without propofol with regular sedation.  A pediatric scope was available as backup should we be unable to pass the thinner adult scope. The  patient's NG tube was removed.  She had difficulties opening her mouth, wide enough to put the mouthpiece in and we were forced to use a pediatric mouth piece.  The slim adult scope was attempted to be passed blindly but the patient was unable to swallow it and after sometime with direct visualization, I was able to pass the scope into the esophagus. There was a large amount of coffee-ground material and blood in the esophagus.  Upon entering, this was vigorously irrigated and sucked out and the distal esophagus was reached and the scope passed into the stomach.  We passed to the second duodenum.  There was some old coffee- ground material but no active bleeding and the duodenal bulb, pyloric channel, antrum and body of the stomach were normal as was the fundus and cardia with coffee-ground material but no active bleeding.  The patient's diaphragm was located at approximately 35 cm and she had approximately a 5 cm hiatal hernia.  The GE junction was widely patent. It was difficult to tell exactly where the Z-line was located due to marked ulceration of the distal esophagus that extended for approximately 10 cm up into the more proximal esophagus.  There were some clots and severe irritated areas  that had the appearance of NG tube trauma.  We went back into the proximal esophagus and vigorously irrigated and washed.  The medial proximal esophagus was normal and then there was a fairly sharp demarcation line and the bottom third of the esophagus was simply a sheet of ulcers with little punctate areas of bleeding and a few adherent clots but no active bleeding after we vigorously irrigated.  This had the appearance of severe ulcerative esophagitis, not a distinct tumor mass.  After I had determined that there was no active bleeding other than a small amount of oozing, we suctioned as much fluid out as possible and came back and evaluated the proximal esophagus which for a short distance  appeared grossly normal. Right at the upper esophageal sphincter as we went into the pharynx was a small linear ulceration.  This was seen only transiently when passing. The scope was withdrawn and the patient tolerated the procedure well. There were no immediate problems.  ASSESSMENT: 1. Upper gastrointestinal bleed - almost certainly due to severe     ulcerative esophagitis in the distal esophagus.  Unclear why the     patient has developed this given the fact that she has been on     aggressive Protonix therapy. 2. Small linear ulcerations at the upper esophageal sphincter probably     causing difficulty passing the scope and pain with swallowing and     eating.  PLAN:  We will keep her on Protonix drip, clear liquids, stop Lovenox for now and we will initiate Carafate slurry.          ______________________________ Llana Aliment Malon Kindle., M.D.     Waldron Session  D:  09/21/2010  T:  09/21/2010  Job:  [id number]  Electronically Signed by Carman Ching M.D. on 10/14/2010 11:19:55 AM

## 2010-10-16 NOTE — Group Therapy Note (Signed)
[name], [name]                ACCOUNT NO.:  [id number]  MEDICAL RECORD NO.:  [id number]           PATIENT TYPE:  I  LOCATION:  1412                         FACILITY:  [facility]  PHYSICIAN:  Gwenyth Bender, NP      DATE OF BIRTH:  Aug 29, 1932                                PROGRESS NOTE   PRIMARY CARE PROVIDER: Deboraha Sprang at [place].  CURRENT DIAGNOSES: 1. Clostridium difficile colitis. 2. Hyponatremia. 3. Microcytic anemia. 4. Mural thrombus within descending thoracic aorta. 5. Mild rhabdomyolysis. 6. Recent left upper extremity deep venous thrombosis, on Lovenox. 7. Thrombocytosis. 8. Chronic leukocytosis. 9. Hypokalemia. 10.Metabolic acidosis.  DISCHARGE MEDICATIONS: To be dictated at the time of discharge.  DIAGNOSTIC LABS: WBCs 53.6, hemoglobin 9.7, hematocrit 30.2, platelets 591, neutrophils 95%, absolute neutrophils 50.9.  Sodium 127, potassium 4.5, chloride 92, CO2 22, BUN 18, creatinine 0.8, glucose 172.  Lipase 7.  PTT 30, PT 16.3, INR 1.29.  Lactic acid 2.1.  Urinalysis  showed small blood, small bili, rare bacteria, 3-6 RBCs.  Pro BNP 980.8.  TSH 2.42.  Total CK 506, CK-MB 4.3, troponin less than 0.30.  Magnesium 2.4.  C difficile by PCR positive.  Urine culture shows no growth.  Cholesterol 77, triglycerides 76, HDL 31, LDL 31, VLDL 15.  Total CK 95, CK-MB 2.7, troponin-I less than 0.30.  Vitamin B12 918.  Fecal occult blood positive.  DIAGNOSTIC IMAGING: Acute abdominal series yields, question constipation.  Bibasilar atelectasis and/or vascular crowding.  Probable tiny left pleural effusion.  No free air.  Mild gaseous prominence of small-bowel with gas and a fair amount of stool.  CT angio of the chest yields extensive mural thrombus is present within the descending thoracic aorta.  This is nonocclusive.  No evidence of pulmonary emboli.  Atherosclerosis including coronary artery disease.  A small right pleural effusion with associated atelectasis.  CT of  the abdomen yields question mild stenosis of the inferior mesenteric artery without occlusive disease.  Superior mesenteric artery is within normal limits.  Extensive spine surgery.  A stable cyst and calcification within the liver.  Progressive splenic infarcts.  CT of the pelvis yields diffuse colitis.  No significant arterial stenosis or occlusive disease to account for the colitis.  This is likely infectious and may represent pseudomembranous colitis.  Status post hysterectomy.  X-ray of thoracic spine on May 26 shows stable thoracic spondylosis and scoliosis.  Lumbar spine three-view on May 26 shows stable alignment of lumbar spine status post prior extensive fusion.  CONSULTS: 1. Dr. Josephina Gip on May 24.  Dr. Candie Chroman impression was     generalized abdominal symptoms secondary to diffuse colitis with     history of splenic infarct.  Thrombus in thoracic aorta probably     also related to her myeloproliferative disorder and     thrombocytopenia but not in continuity with a splenic infarct.  His     recommendations yielded no role for vascular surgery in this     patient unless she should develop limb-threatening ischemia, or     significant thrombotic occlusion of mesenteric or renal vessels, or  life-threatening areas.  Recommended continuing anticoagulation as     outlined by Dr. Truett Perna.  Will follow up on a p.r.n. basis. 2. Dr. Carman Ching on May 25 from Gastroenterology recommended     continuing vancomycin and Flagyl, and Florastor with a switch to     Dificid if her symptoms do not continue to improve.  He also     indicated the patient with colon polyps that were biopsied and were     benign but need to be removed, indicating this could be done on an     outpatient basis with Dr. Bosie Clos. 3. Dr. Thornton Papas from Hematology on May 29 recommended that she is     stable from a hematological standpoint, indicating there was no     need for a daily CBC.  She  likely has an underlying     myeloproliferative disorder; however, it is not clear whether this     has caused a thromboembolic disease.  He recommends continuing     Lovenox and aspirin.  No need for platelet-lowering therapy at     present and continuing the iron.  PROCEDURES: None.  BRIEF HISTORY: [name] is a 77 year old female with multiple medical problems including a known history of myeloproliferative disorder and a recent diagnosis of left upper extremity DVT as well as ischemic colitis in April of this year.  She presented to the [facility] ED on May 22 with a chief complaint of nausea, vomiting, and continuous diarrhea.  She indicates that she had 10 episodes of diarrhea on the first day, also some vomiting and she became dehydrated.  She indicated that her abdominal pain was an 8/10, persistent, and not getting better with treatment.  She has also been unable to keep fluids down.  Her workup in the emergency room yielded diffuse colitis with leukocytosis. Hospitalists were asked to admit for further evaluation and treatment.  HOSPITAL COURSE: 1. C diff colitis:  The patient was started on vancomycin, Flagyl, and    Florastor.  She has been on this for 7 days.  Her improvement has     waxed and waned and been very slow.  Her diarrhea, stools have     slowed down in number.  A diet was started for her on May 28.  She     did experience some worsening abdominal pain this morning after     breakfast as well as nausea.  There was no vomiting.  She gets     Zocor and this morning she got 1 dose of Phenergan that seems to     have relieved her pain and nausea.  She has had 2 to 4 bowel     movements in the last 2 days, which seems to be close to her     baseline.  However, her progress is very slow and waxing and     waning. 2. Hyponatremia secondary to dehydration:  Resolved. 3. Microcytic anemia secondary to iron deficiency and chronic disease:     Hemoglobin has been  stable.  No signs of bleeding.  Did have a     guaiac stool positive.  Does need to follow up with GI on     outpatient basis for removal of polyps. 4. Mural thrombus in descending thoracic aorta:  CV TS consult     obtained as stated above.  Possibly secondary to her     myeloproliferative disorder. 5. Recent left upper extremity DVT:  The patient was  on Lovenox before     she was admitted and has continued on that during this     hospitalization. 6. Mild rhabdomyolysis:  Resolved. 7. Chronic thrombocytosis and leukocytosis:  Hematology following.     Currently recommend continuing iron.  No need to initiate platelet-     lowering therapy at this time.  No need for daily CBCs. 8. Hypokalemia:  Resolved. 9. Metabolic acidosis:  Resolved.  PHYSICAL EXAM: VITAL SIGNS:  Temperature 98.7, blood pressure 122/73, heart rate 93, respirations 22, saturations 100% on room air. GENERAL:  Awake, alert, somewhat uncomfortable from worsening abdominal pain. CARDIOVASCULAR:  Regular rate and rhythm.  No murmur, gallop or rub. RESPIRATORY:  Normal respiratory effort.  Breath sounds clear to auscultation bilaterally.  No rhonchi, wheezes or rales. ABDOMEN:  Round, soft, positive bowel sounds.  Mild diffuse tenderness throughout.  No guarding.  No rebound. NEUROLOGIC:  Alert and oriented x3.  DISPOSITION: Hopefully anticipate discharge in 24 to 48 hours.  The patient's nausea and abdominal pain are slowly improving but do tend to wax and wane. The patient has been noncooperative with PT and OT efforts and therefore has not been out of bed very much, so we do need to push that.  Dictated For:  Hartley Barefoot, MD     Gwenyth Bender, NP     KMB/MEDQ  D:  09/14/2010  T:  09/14/2010  Job:  [id number]  Electronically Signed by Toya Smothers  on 10/16/2010 11:02:13 AM

## 2010-10-22 ENCOUNTER — Ambulatory Visit (INDEPENDENT_AMBULATORY_CARE_PROVIDER_SITE_OTHER): Payer: Self-pay | Admitting: General Surgery

## 2010-10-29 ENCOUNTER — Ambulatory Visit (INDEPENDENT_AMBULATORY_CARE_PROVIDER_SITE_OTHER): Payer: Self-pay | Admitting: General Surgery

## 2010-11-06 NOTE — Progress Notes (Signed)
NAME:  [name], [name]                ACCOUNT NO.:  [id number]  MEDICAL RECORD NO.:  [id number]  LOCATION:                                 FACILITY:  PHYSICIAN:  Haley Roza I Skylur Fuston, MD      DATE OF BIRTH:  1933/03/16                                PROGRESS NOTE   PRIMARY CARE PHYSICIAN: [facility].  DISCHARGE DIAGNOSES: 1. Upper gastrointestinal bleeding. 2. Ulcerative esophagitis, severe. 3. Acute blood loss anemia and iron-deficiency anemia. 4. Clostridium difficile colitis. 5. Hospital-acquired pneumonia. 6. Mural thrombus within descending thoracic aorta, who has no     indication for surgery. 7. Left lower extremity deep venous thrombosis. 8. Thrombocytosis secondary to iron-deficiency anemia. 9. Myeloproliferative disorder. 10.Leukocytosis, resolved. 11.Generalized deconditioning.  Medications to be dictated at the date of actual discharge.  Current medications; 1. Aspirin 81 mg p.o. daily. 2. Lovenox 50 mg subcutaneous q.12 h. 3. Ensure 237 b.i.d. 4. Ferrous sulfate 325 mg p.o. b.i.d. 5. Neurontin 300 mg b.i.d. 6. Metoprolol 12.5 mg twice daily. 7. Multivitamins 1 tablet daily. 8. Protonix 40 mg, is switched today to p.o. b.i.d. 9. Zosyn IV to be completed on June 14th. 10.Potassium chloride 20 mEq b.i.d. 11.Florastor 500 mg p.o. b.i.d. 12.Carafate 1 g p.o. t.i.d. 13.Vancomycin 125 mg p.o. b.i.d. 14.Coumadin, per pharmacy. 15.Albuterol nebs.  PROCEDURE COVERING THIS: 1. The patient has abdominal x-ray, which did show unremarkable bowel     gas pattern.  No acute soft tissue finding.  His spinal extension,     spinal fusion with screw lucency suggesting motion and nonunion. 2. Chest x-ray:  Resistant left lower lobe consideration with small     left pleural effusion and many right bibasilar atelectasis.  HOSPITAL COURSE: Please review the progress note done by Dr. Betti Cruz on June 5th, covering this.  The patient developed abdominal pain. 1. The patient  developed melena and the patient transferred to step-     down and the patient kept n.p.o.  Gastroenterology did endoscopy,     which did show severe ulcerative esophagitis and the patient     started on Protonix drip.  The patient continued through this     period with Protonix drip per Gastroenterology recommendations and     Carafate.  Today, Protonix was switched to oral after completing     the current PPI drip.  The patient's hemoglobin and hematocrit is     stable.  The patient received a total of 4 units of blood     transfusion.  Gastroenterology agreed with starting the patient on     the Lovenox and Dr. Truett Perna, kindly visited the patient for     recommendation regarding anticoagulation.  Please review his     recommendation on June 7th, where he recommended treatment with     anticoagulation when okay with GI and he recommended Lovenox and     transition to Coumadin when discharged.  She is supposed to take     from 6 to 8 weeks additional anticoagulation for the right lower     extremity DVT.  Then decision regarding long-term anticoagulation     with consideration of the mural thrombus  and dyskinetic infarct.     Per vascular services, there is no need for surgical intervention     unless there is organ ischemia.  Likely, this patient will need     long-term anticoagulation unless contraindicated.  Accordingly,     Lovenox and Coumadin is started.  Our plan is to watch the patient     closely for any sign of GI bleeding.  Currently, hemoglobin     remained stable and iron-deficiency anemia maintained was iron     supplement.  Also, the patient continued to complain of dark stool,     likely this is related to the iron supplement. 2. Hospital-acquired pneumonia.  The patient continued to have     shortness of breath and further chest x-ray suggested lung     pneumonia.  The patient was treated with Zosyn and today is day #4     out of 7.  We will try to minimize antibiotics  at only 5-7 days. 3. Seven beats of wide complex QRS.  The patient maintained on beta-     blocker.  The patient denies any chest pain. 4. Deconditioning and the patient will maintained on PT/OT.  At this     time, out plan the patient to be discharged to SNF within the next     24 to 48 hours if remained medically stable.     Lamberto Dinapoli Bosie Helper, MD     HIE/MEDQ  D:  09/28/2010  T:  09/28/2010  Job:  [id number]  Electronically Signed by Ebony Cargo MD on 11/06/2010 02:50:51 PM

## 2010-11-26 ENCOUNTER — Encounter (INDEPENDENT_AMBULATORY_CARE_PROVIDER_SITE_OTHER): Payer: Self-pay | Admitting: General Surgery

## 2010-12-16 NOTE — Progress Notes (Signed)
Patient scheduled with Dr. Johna Sheriff 12/24/10 @ 10:00, pt. aware

## 2010-12-24 ENCOUNTER — Encounter (INDEPENDENT_AMBULATORY_CARE_PROVIDER_SITE_OTHER): Payer: Self-pay | Admitting: General Surgery

## 2011-01-12 ENCOUNTER — Encounter (INDEPENDENT_AMBULATORY_CARE_PROVIDER_SITE_OTHER): Payer: Self-pay | Admitting: General Surgery

## 2012-04-08 IMAGING — CR DG ABDOMEN ACUTE W/ 1V CHEST
3 series · 3 of 3 positions shown · non-contrast
Comparison: CT abdomen 04/13/2006, lumbar spine films 05/22/1998

CLINICAL DATA: Abdominal pain, nausea and vomiting

ACUTE ABDOMEN SERIES (ABDOMEN 2 VIEW & CHEST 1 VIEW)

[w chest pa]
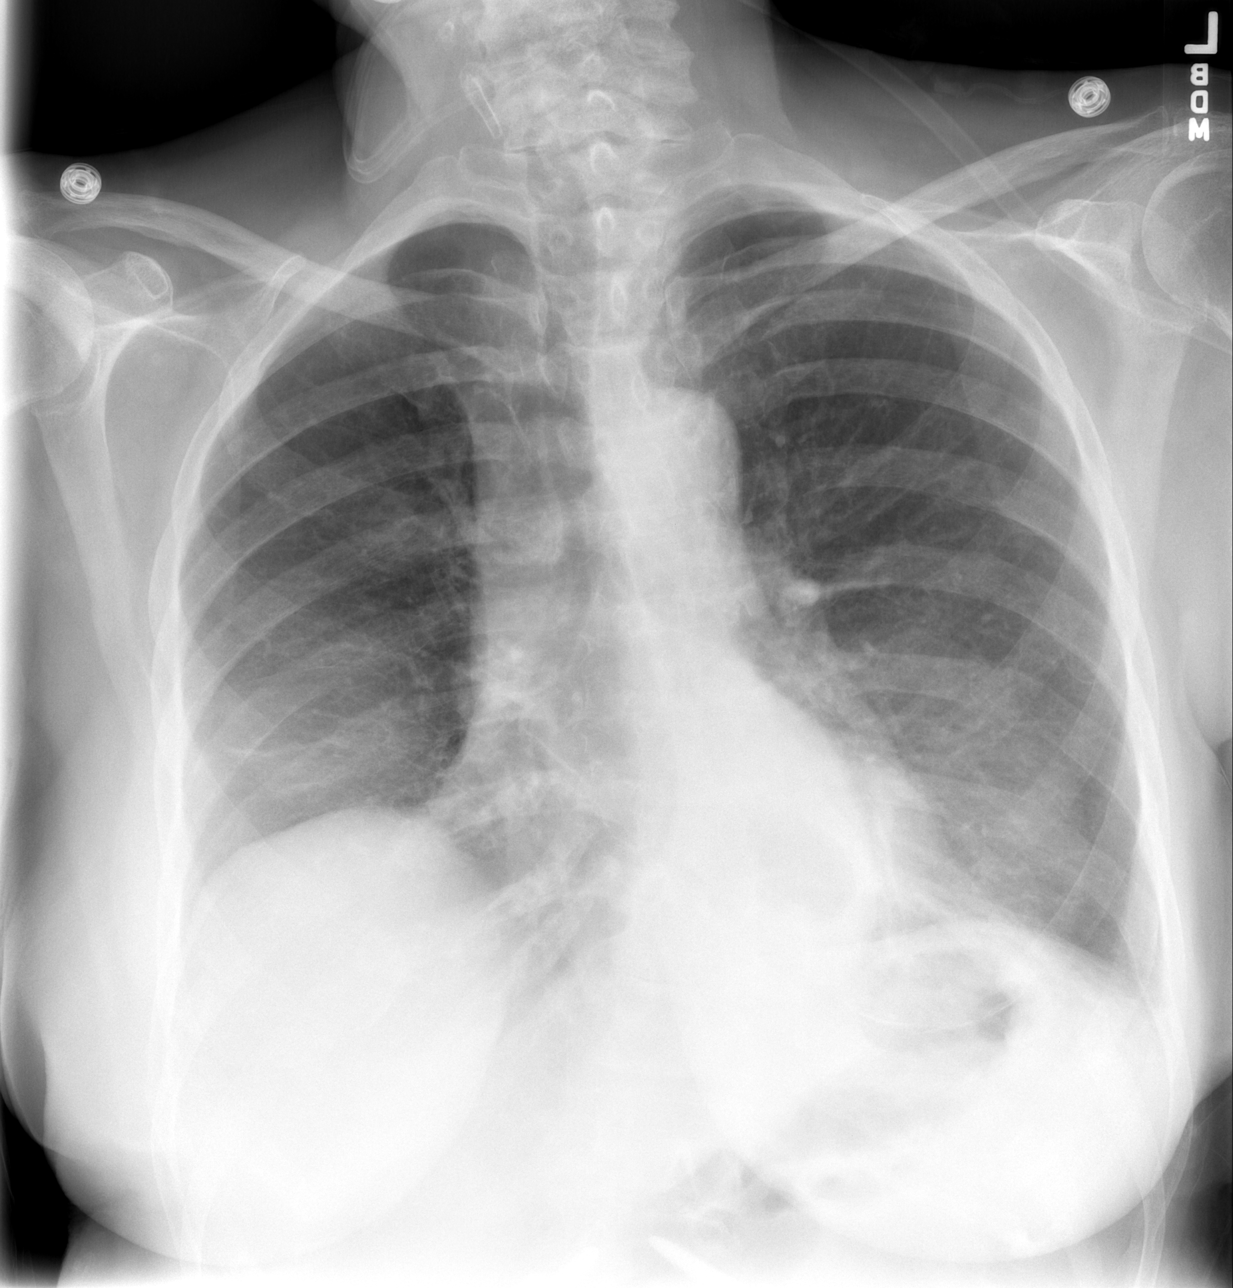

[w abdomen upright]
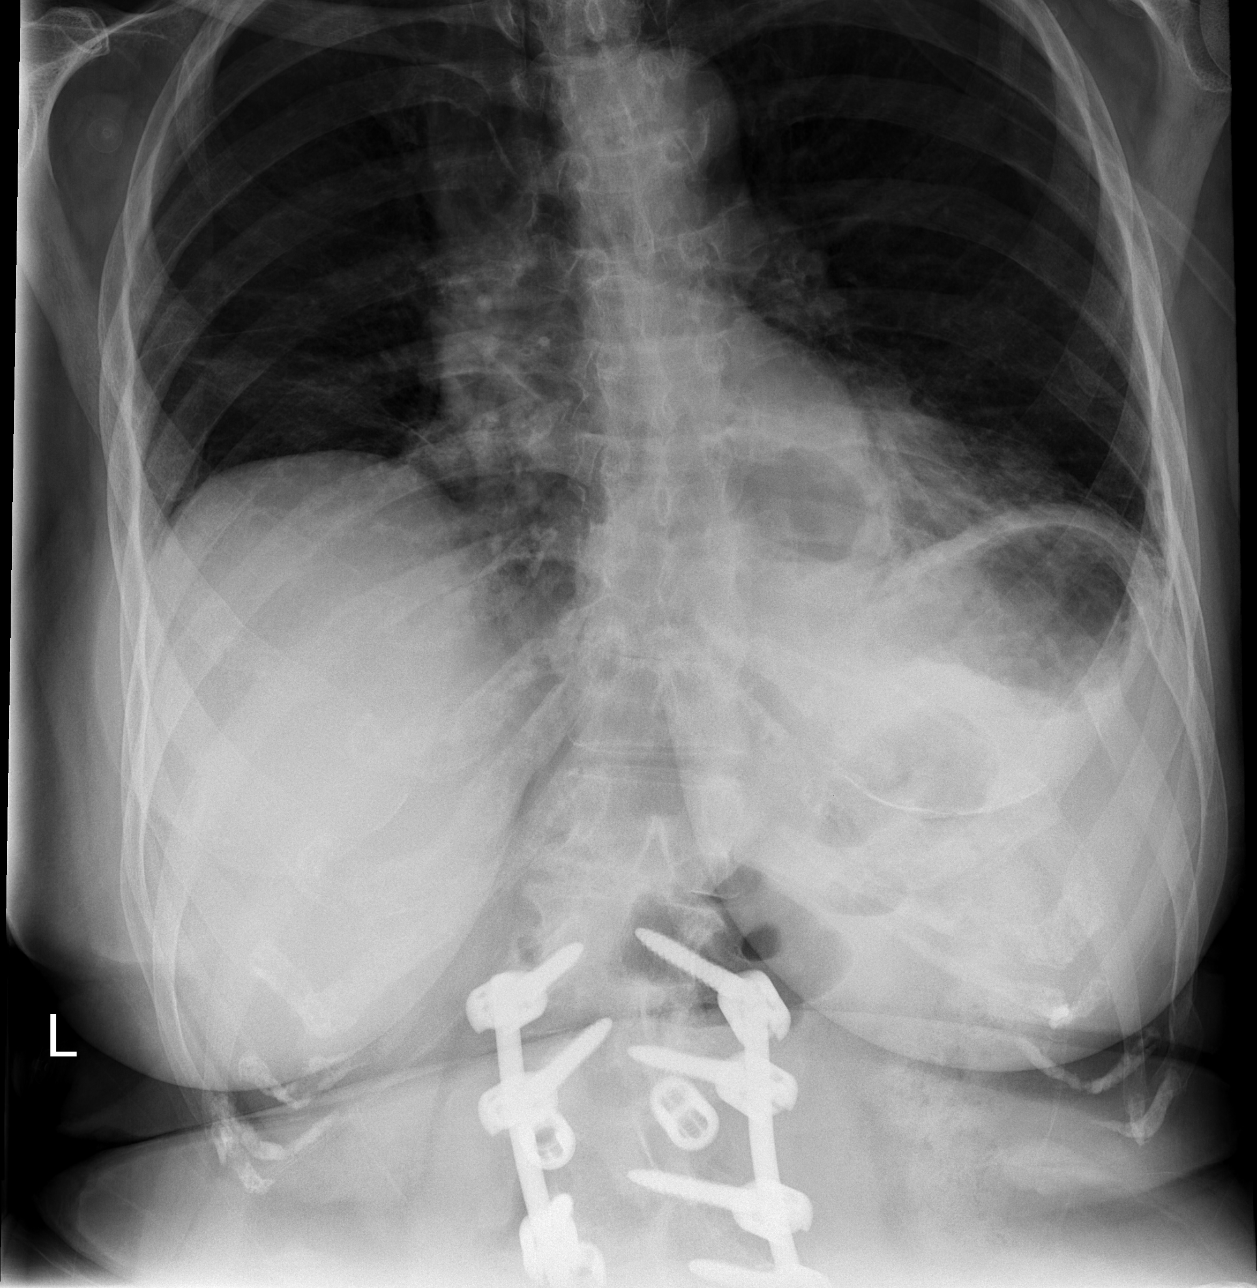

[t abdomen supine]
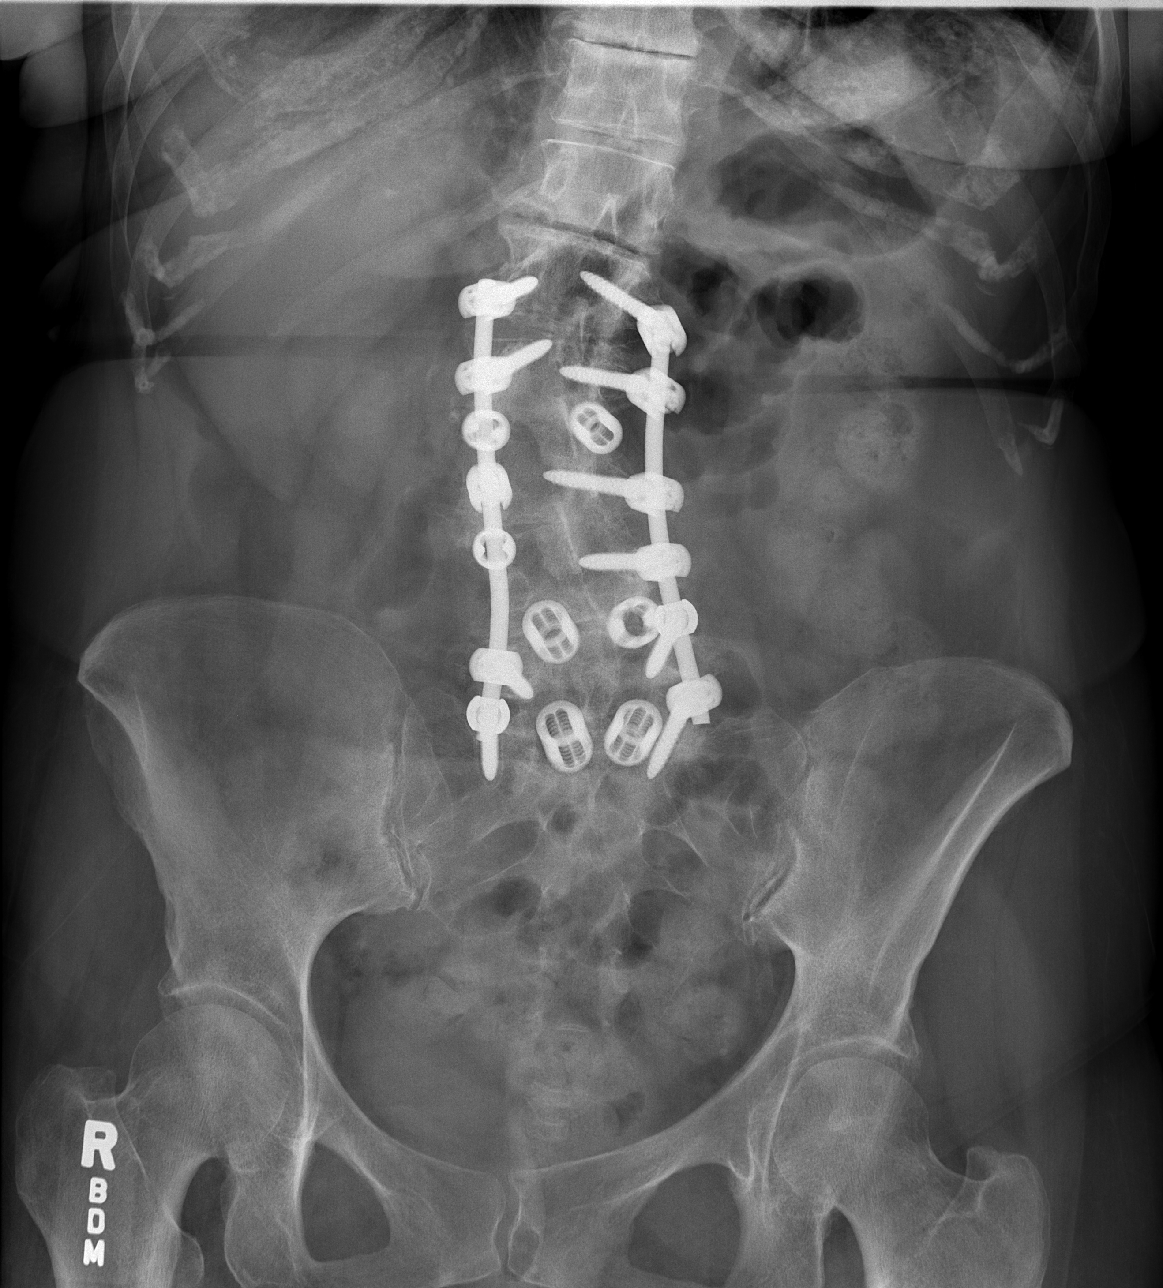

[3 of 3 positions shown; findings below may reference images not displayed]

FINDINGS: Normal cardiac silhouette.  There is a hiatal hernia
present.  There is subtle obscuration of the left heart border.
Small air space disease in the right lower lobe additionally.

No free air beneath hemidiaphragms.  No dilated loops of large or
small bowel.  There is gas of stool rectosigmoid colon.  Posterior
lumbar fusion noted.  No acute bony abnormality.
IMPRESSION: 1.  Bibasilar air space disease concerning for early pneumonia or
potential pulmonary edema.  Of note, similar findings at the lung
bases on CT 04/13/2006.

[DATE].  No evidence of bowel obstruction or intraperitoneal free air.
3.  Moderate size hiatal hernia.

## 2012-05-24 IMAGING — CR DG ABDOMEN ACUTE W/ 1V CHEST
3 series · 3 of 3 positions shown · non-contrast
Comparison: 06/09/2010

CLINICAL DATA: Abdominal pain, nausea, and vomiting.

ACUTE ABDOMEN SERIES (ABDOMEN 2 VIEW & CHEST 1 VIEW)

[w chest pa]
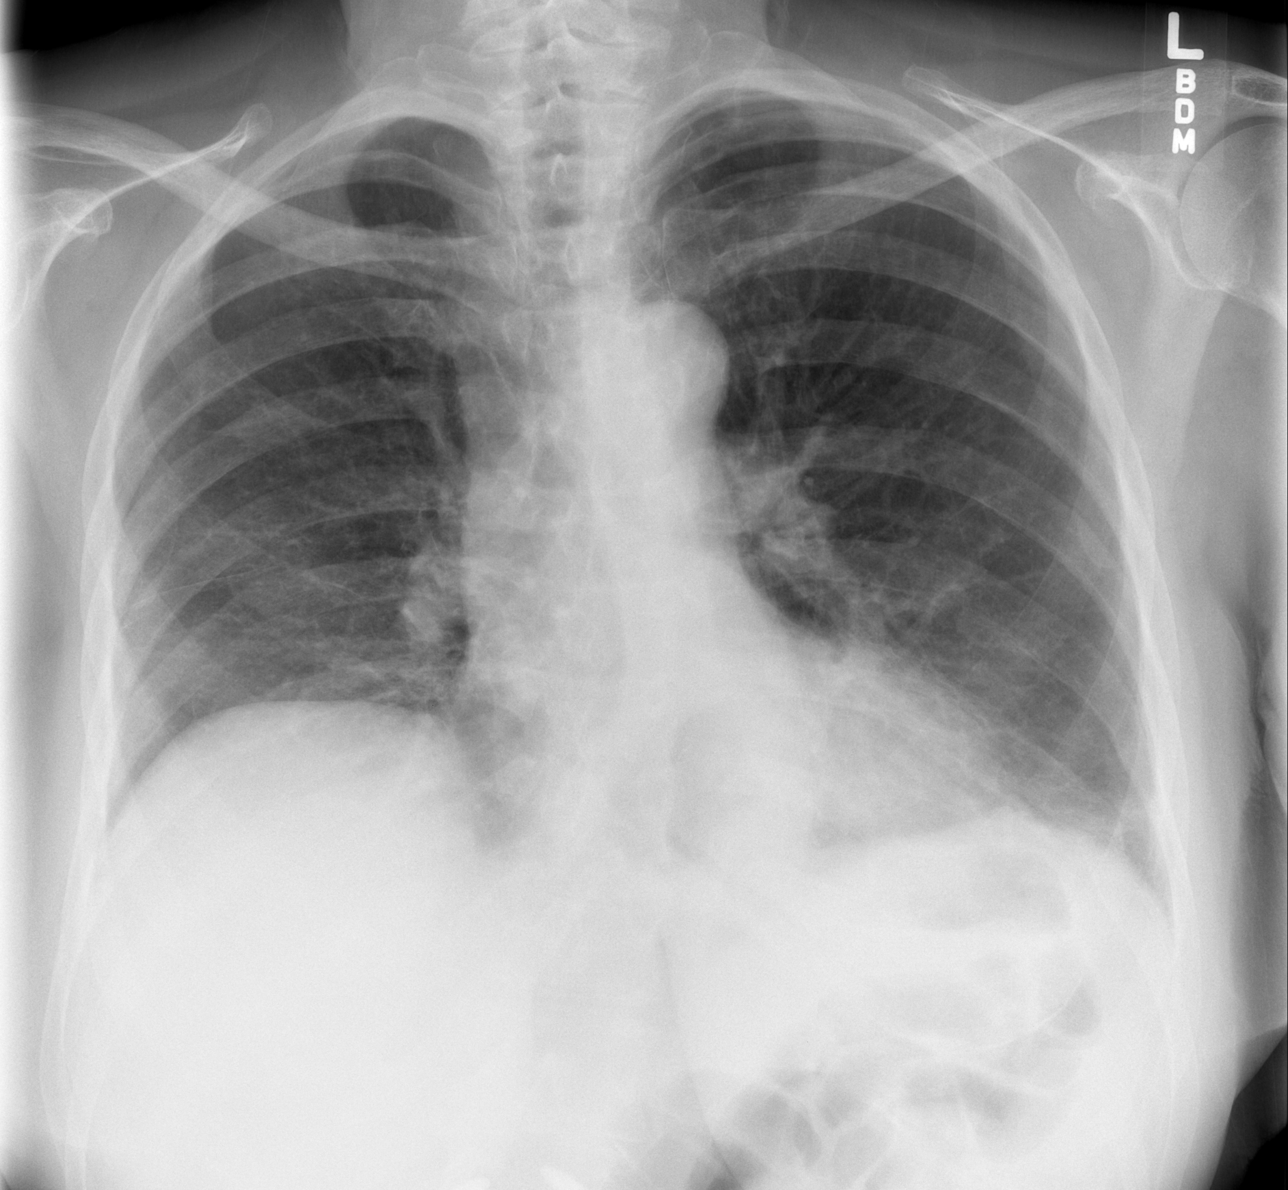

[w abdomen upright]
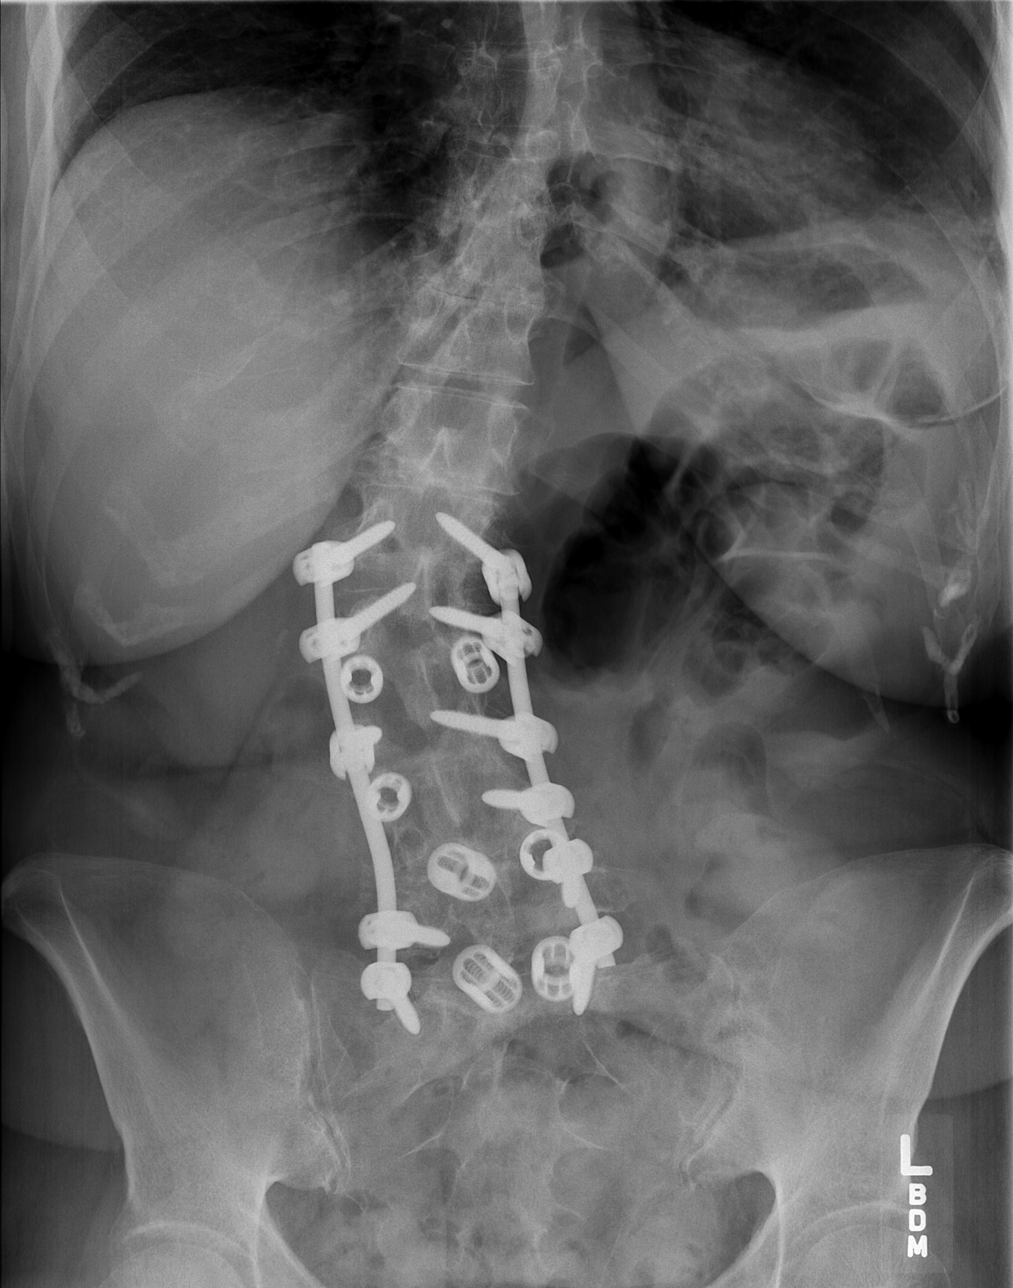

[t abdomen supine]
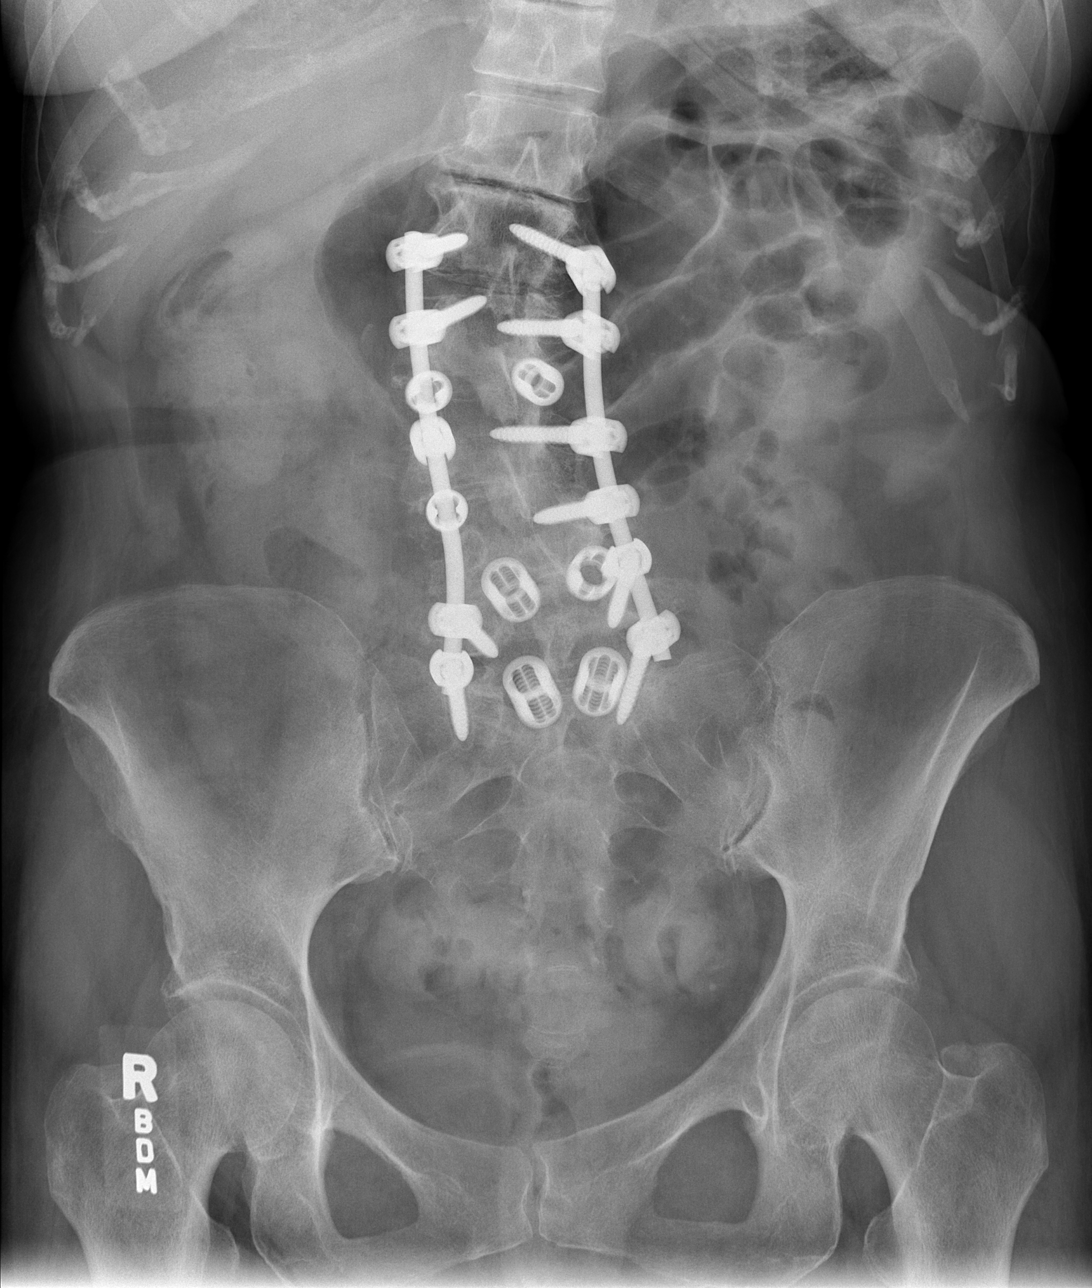

[3 of 3 positions shown; findings below may reference images not displayed]

FINDINGS: Normal heart size and pulmonary vascularity.  Esophageal
hiatal hernia behind the heart.  Airspace disease in the left lung
base remains present but is improving since the previous study.  No
new consolidation.  No blunting of costophrenic angles.  Tortuous
aorta.

Lumbar scoliosis with postoperative changes in the lumbar spine.
Stool throughout the colon without distension.  Nondistended gas-
filled small bowel loops in the upper abdomen are nonspecific but
likely representing ileus.  No abnormal air fluid levels.  No free
air.
IMPRESSION: Improving left lung base pneumonia.  No new consolidation.
Nonobstructive bowel gas pattern.

## 2012-06-05 ENCOUNTER — Other Ambulatory Visit: Payer: Self-pay | Admitting: Otolaryngology

## 2012-09-25 ENCOUNTER — Telehealth: Payer: Self-pay | Admitting: *Deleted

## 2012-09-25 NOTE — Telephone Encounter (Signed)
Lab work from Dr Mirant office at [place] sent to Dr Truett Perna to review.  Per Dr Truett Perna, he wants to know if PCP would like for Dr Truett Perna to f/u with her.  Last f/u with Dr Truett Perna 04/2010.  Left msg with secretary who will discuss with Dr Quita Skye RN.  They will return a call to Dr Kalman Drape RN.  SLJ

## 2012-09-26 ENCOUNTER — Telehealth: Payer: Self-pay | Admitting: *Deleted

## 2012-09-26 NOTE — Telephone Encounter (Signed)
Received faxed labs from Dr. Quita Skye office dated 09/14/12. Pt has been referred to GI to evaluate anemia.

## 2012-10-05 ENCOUNTER — Encounter (HOSPITAL_COMMUNITY): Payer: Self-pay | Admitting: *Deleted

## 2012-10-05 ENCOUNTER — Encounter (HOSPITAL_COMMUNITY): Payer: Self-pay | Admitting: Pharmacy Technician

## 2012-10-05 DIAGNOSIS — Z9289 Personal history of other medical treatment: Secondary | ICD-10-CM

## 2012-10-05 DIAGNOSIS — I82609 Acute embolism and thrombosis of unspecified veins of unspecified upper extremity: Secondary | ICD-10-CM

## 2012-10-05 DIAGNOSIS — I499 Cardiac arrhythmia, unspecified: Secondary | ICD-10-CM

## 2012-10-05 DIAGNOSIS — M1990 Unspecified osteoarthritis, unspecified site: Secondary | ICD-10-CM

## 2012-10-05 DIAGNOSIS — D649 Anemia, unspecified: Secondary | ICD-10-CM

## 2012-10-05 HISTORY — PX: BACK SURGERY: SHX140

## 2012-10-05 HISTORY — DX: Unspecified osteoarthritis, unspecified site: M19.90

## 2012-10-05 HISTORY — DX: Cardiac arrhythmia, unspecified: I49.9

## 2012-10-05 HISTORY — DX: Acute embolism and thrombosis of unspecified veins of unspecified upper extremity: I82.609

## 2012-10-05 HISTORY — DX: Personal history of other medical treatment: Z92.89

## 2012-10-05 HISTORY — DX: Anemia, unspecified: D64.9

## 2012-10-22 ENCOUNTER — Other Ambulatory Visit: Payer: Self-pay | Admitting: Gastroenterology

## 2012-10-23 ENCOUNTER — Encounter (HOSPITAL_COMMUNITY): Payer: Self-pay | Admitting: Anesthesiology

## 2012-10-23 ENCOUNTER — Encounter (HOSPITAL_COMMUNITY): Payer: Self-pay | Admitting: *Deleted

## 2012-10-23 ENCOUNTER — Ambulatory Visit (HOSPITAL_COMMUNITY)
Admission: RE | Admit: 2012-10-23 | Discharge: 2012-10-23 | Disposition: A | Discharge status: Home / Self Care | Payer: Medicare Other | Source: Ambulatory Visit | Attending: Gastroenterology | Admitting: Gastroenterology

## 2012-10-23 ENCOUNTER — Encounter (HOSPITAL_COMMUNITY)
Admission: RE | Disposition: A | Discharge status: Home / Self Care | Payer: Self-pay | Source: Ambulatory Visit | Attending: Gastroenterology

## 2012-10-23 ENCOUNTER — Ambulatory Visit (HOSPITAL_COMMUNITY): Payer: Medicare Other | Admitting: Anesthesiology

## 2012-10-23 DIAGNOSIS — K648 Other hemorrhoids: Secondary | ICD-10-CM | POA: Insufficient documentation

## 2012-10-23 DIAGNOSIS — D126 Benign neoplasm of colon, unspecified: Secondary | ICD-10-CM | POA: Insufficient documentation

## 2012-10-23 DIAGNOSIS — K219 Gastro-esophageal reflux disease without esophagitis: Secondary | ICD-10-CM | POA: Insufficient documentation

## 2012-10-23 DIAGNOSIS — D509 Iron deficiency anemia, unspecified: Secondary | ICD-10-CM | POA: Diagnosis present

## 2012-10-23 DIAGNOSIS — K449 Diaphragmatic hernia without obstruction or gangrene: Secondary | ICD-10-CM | POA: Insufficient documentation

## 2012-10-23 HISTORY — DX: Gastro-esophageal reflux disease without esophagitis: K21.9

## 2012-10-23 HISTORY — DX: Unspecified osteoarthritis, unspecified site: M19.90

## 2012-10-23 HISTORY — DX: Cardiac arrhythmia, unspecified: I49.9

## 2012-10-23 HISTORY — PX: COLONOSCOPY WITH PROPOFOL: SHX5780

## 2012-10-23 HISTORY — PX: HOT HEMOSTASIS: SHX5433

## 2012-10-23 HISTORY — DX: Personal history of other diseases of the digestive system: Z87.19

## 2012-10-23 HISTORY — DX: Personal history of other medical treatment: Z92.89

## 2012-10-23 HISTORY — DX: Acute embolism and thrombosis of unspecified veins of unspecified upper extremity: I82.609

## 2012-10-23 HISTORY — DX: Anemia, unspecified: D64.9

## 2012-10-23 LAB — HEMOGLOBIN AND HEMATOCRIT, BLOOD
HCT: 31.3 % — ABNORMAL LOW (ref 36.0–46.0)
Hemoglobin: 9.2 g/dL — ABNORMAL LOW (ref 12.0–15.0)

## 2012-10-23 SURGERY — COLONOSCOPY WITH PROPOFOL
Anesthesia: Monitor Anesthesia Care

## 2012-10-23 MED ORDER — SPOT INK MARKER SYRINGE KIT
PACK | SUBMUCOSAL | Status: AC
  Filled 2012-10-23: qty 5

## 2012-10-23 MED ORDER — LIDOCAINE HCL 1 % IJ SOLN
INTRAMUSCULAR | Status: AC
  Filled 2012-10-23: qty 20

## 2012-10-23 MED ORDER — LACTATED RINGERS IV SOLN
INTRAVENOUS | Status: DC | PRN
  Administered 2012-10-23 (×2): via INTRAVENOUS

## 2012-10-23 MED ORDER — MIDAZOLAM HCL 5 MG/5ML IJ SOLN
INTRAMUSCULAR | Status: DC | PRN
  Administered 2012-10-23 (×3): 0.5 mg via INTRAVENOUS

## 2012-10-23 MED ORDER — LACTATED RINGERS IV SOLN
INTRAVENOUS | Status: DC
  Administered 2012-10-23: 1000 mL via INTRAVENOUS

## 2012-10-23 MED ORDER — FENTANYL CITRATE 0.05 MG/ML IJ SOLN
INTRAMUSCULAR | Status: DC | PRN
  Administered 2012-10-23 (×2): 25 ug via INTRAVENOUS
  Administered 2012-10-23: 50 ug via INTRAVENOUS

## 2012-10-23 MED ORDER — KETAMINE HCL 10 MG/ML IJ SOLN
INTRAMUSCULAR | Status: DC | PRN
  Administered 2012-10-23 (×2): 5 mg via INTRAVENOUS

## 2012-10-23 MED ORDER — SODIUM CHLORIDE 0.9 % IV SOLN: INTRAVENOUS | Status: DC

## 2012-10-23 MED ORDER — METOPROLOL TARTRATE 50 MG PO TABS
50.0000 mg | ORAL_TABLET | Freq: Once | ORAL | Status: AC
  Administered 2012-10-23: 50 mg via ORAL
  Filled 2012-10-23: qty 1

## 2012-10-23 MED ORDER — EPHEDRINE SULFATE 50 MG/ML IJ SOLN
INTRAMUSCULAR | Status: DC | PRN
  Administered 2012-10-23: 5 mg via INTRAVENOUS

## 2012-10-23 MED ORDER — PROPOFOL INFUSION 10 MG/ML OPTIME
INTRAVENOUS | Status: DC | PRN
  Administered 2012-10-23: 75 ug/kg/min via INTRAVENOUS

## 2012-10-23 MED ORDER — ONDANSETRON HCL 4 MG/2ML IJ SOLN
INTRAMUSCULAR | Status: DC | PRN
  Administered 2012-10-23 (×2): 2 mg via INTRAVENOUS

## 2012-10-23 MED ORDER — SPOT INK MARKER SYRINGE KIT
PACK | SUBMUCOSAL | Status: DC | PRN
  Administered 2012-10-23: 2 mL via SUBMUCOSAL
  Administered 2012-10-23: 3 mL via SUBMUCOSAL

## 2012-10-23 SURGICAL SUPPLY — 22 items

## 2012-10-23 NOTE — Anesthesia Preprocedure Evaluation (Addendum)
Anesthesia Evaluation  Patient identified by MRN, date of birth, ID band Patient awake    Reviewed: Allergy & Precautions, H&P , NPO status , Patient's Chart, lab work & pertinent test results  Airway Mallampati: III TM Distance: <3 FB   Mouth opening: Limited Mouth Opening  Dental  (+) Teeth Intact, Poor Dentition and Dental Advisory Given   Pulmonary neg pulmonary ROS,  breath sounds clear to auscultation  Pulmonary exam normal       Cardiovascular DVT + dysrhythmias Supra Ventricular Tachycardia Rhythm:Regular Rate:Tachycardia     Neuro/Psych Chronic low back pain; Oxycontin QID as directed negative neurological ROS  negative psych ROS   GI/Hepatic Neg liver ROS, hiatal hernia, GERD-  ,GI blood loss; prior transfusions   Endo/Other  negative endocrine ROS  Renal/GU negative Renal ROS  negative genitourinary   Musculoskeletal negative musculoskeletal ROS (+)   Abdominal   Peds  Hematology negative hematology ROS (+)   Anesthesia Other Findings Diminished oral radius  Reproductive/Obstetrics                          Anesthesia Physical Anesthesia Plan  ASA: III  Anesthesia Plan: MAC   Post-op Pain Management:    Induction: Intravenous  Airway Management Planned: Simple Face Mask  Additional Equipment:   Intra-op Plan:   Post-operative Plan:   Informed Consent: I have reviewed the patients History and Physical, chart, labs and discussed the procedure including the risks, benefits and alternatives for the proposed anesthesia with the patient or authorized representative who has indicated his/her understanding and acceptance.   Dental advisory given  Plan Discussed with: CRNA  Anesthesia Plan Comments:         Anesthesia Quick Evaluation

## 2012-10-23 NOTE — Transfer of Care (Signed)
Immediate Anesthesia Transfer of Care Note  Patient: Nichola Sizer  Procedure(s) Performed: Procedure(s): COLONOSCOPY WITH PROPOFOL (N/A) HOT HEMOSTASIS (ARGON PLASMA COAGULATION/BICAP) (N/A)  Patient Location: PACU  Anesthesia Type:MAC  Level of Consciousness: awake, alert , oriented and patient cooperative  Airway & Oxygen Therapy: Patient Spontanous Breathing and Patient connected to face mask oxygen  Post-op Assessment: Report given to PACU RN and Post -op Vital signs reviewed and stable  Post vital signs: stable  Complications: No apparent anesthesia complications

## 2012-10-23 NOTE — Anesthesia Postprocedure Evaluation (Signed)
Anesthesia Post Note  Patient: [name]  Procedure(s) Performed: Procedure(s) (LRB): COLONOSCOPY WITH PROPOFOL (N/A) HOT HEMOSTASIS (ARGON PLASMA COAGULATION/BICAP) (N/A)  Anesthesia type: MAC  Patient location: PACU  Post pain: Pain level controlled  Post assessment: Post-op Vital signs reviewed  Last Vitals:  Filed Vitals:   10/23/12 1129  BP: 123/61  Pulse:   Temp:   Resp: 13    Post vital signs: Reviewed  Level of consciousness: sedated  Complications: No apparent anesthesia complications

## 2012-10-23 NOTE — Interval H&P Note (Signed)
History and Physical Interval Note:  10/23/2012 8:15 AM  [name]  has presented today for surgery, with the diagnosis of colon polyp  The various methods of treatment have been discussed with the patient and family. After consideration of risks, benefits and other options for treatment, the patient has consented to  Procedure(s): COLONOSCOPY WITH PROPOFOL (N/A) HOT HEMOSTASIS (ARGON PLASMA COAGULATION/BICAP) (N/A) as a surgical intervention .  The patient's history has been reviewed, patient examined, no change in status, stable for surgery.  I have reviewed the patient's chart and labs.  Questions were answered to the patient's satisfaction.     Treven Holtman C.

## 2012-10-23 NOTE — Op Note (Signed)
[facility] [address] [place], [address]   COLONOSCOPY PROCEDURE REPORT  PATIENT: Crystal, [name]  MR#: [id number] BIRTHDATE: Oct 31, 1932 , 80  yrs. old GENDER: Female ENDOSCOPIST: Charlott Rakes, MD REFERRED XB:JYNW Clovis Riley, M.D. PROCEDURE DATE:  10/23/2012 PROCEDURE:   Colonoscopy with snare polypectomy ASA CLASS: INDICATIONS:Iron Deficiency Anemia and follow up of adenomatous colonic polyp(s). MEDICATIONS: See Anesthesia Report.  DESCRIPTION OF PROCEDURE:   After the risks benefits and alternatives of the procedure were thoroughly explained, informed consent was obtained.  The Pentax Ped Colon [id number]  endoscope was introduced through the anus and advanced to the cecum, which was identified by both the appendix and ileocecal valve , limited by No adverse events experienced.   limited by Limited by a tortuous and redundant colon.   limited by Limited by extreme patient discomfort.   The quality of the prep was prep is suboptimal .  The instrument was then slowly withdrawn as the colon was fully examined.     FINDINGS:  Rectal exam unremarkable.  Pediatric colonoscope inserted into the colon and advanced to the cecum, where the appendiceal orifice and ileocecal valve were identified.  In order to reach the cecum repeated loop reduction was necessary. Abdominal pressure was needed in order to reach the cecum. Prep limited complete visualization of the cecum and parts of the left colon due to solid and semi-solid stool. A 6 mm sessile polyp was removed with snare cautery from the cecum. A 3 mm semi-sessile polyp adjacent to this sessile polyp was removed with cold snare with minimal bleeding. On careful withdrawal of the colonoscope a 1.2 cm sessile polyp that was seen on insertion was removed with snare cautery and 3 cc of Inkspot was used to tattoo the site. On further withdrawal a 1.5 cm multi-lobulated polyp was biopsied and the mucosa adjacent  to the polyp was tattooed with 2 cc of Inkspot. The previously tattooed mucosa in the sigmoid colon was noted with a large polypoid lesion that was biopsied. The lesion was approximately 35 cm from the anus.    Retroflexion revealed small internal hemorrhoids.  COMPLICATIONS: None  IMPRESSION:     Multiple colon polyps biopsied or removed as stated above  RECOMMENDATIONS: F/U on path; Avoid aspirin products for 2 weeks    ______________________________ eSigned:  Charlott Rakes, MD 10/23/2012 11:03 AM   GN:FAOZ Clovis Riley, MD  PATIENT NAME:  Crystal, [name] MR#: [id number]

## 2012-10-23 NOTE — H&P (Signed)
  Date of Initial H&P: 10/04/12  History reviewed, patient examined, no change in status, stable for surgery.

## 2012-10-24 ENCOUNTER — Encounter (HOSPITAL_COMMUNITY): Payer: Self-pay | Admitting: Gastroenterology

## 2014-01-24 ENCOUNTER — Encounter (HOSPITAL_COMMUNITY): Payer: Self-pay | Admitting: Emergency Medicine

## 2014-01-24 ENCOUNTER — Inpatient Hospital Stay (HOSPITAL_COMMUNITY)
Admission: EM | Admit: 2014-01-24 | Discharge: 2014-01-27 | DRG: 392 | Disposition: A | Discharge status: Home / Self Care | Payer: Medicare Other | Attending: Internal Medicine | Admitting: Internal Medicine

## 2014-01-24 ENCOUNTER — Emergency Department (HOSPITAL_COMMUNITY): Payer: Medicare Other

## 2014-01-24 DIAGNOSIS — Z7982 Long term (current) use of aspirin: Secondary | ICD-10-CM

## 2014-01-24 DIAGNOSIS — E860 Dehydration: Secondary | ICD-10-CM

## 2014-01-24 DIAGNOSIS — Z9842 Cataract extraction status, left eye: Secondary | ICD-10-CM

## 2014-01-24 DIAGNOSIS — I10 Essential (primary) hypertension: Secondary | ICD-10-CM | POA: Diagnosis not present

## 2014-01-24 DIAGNOSIS — K529 Noninfective gastroenteritis and colitis, unspecified: Secondary | ICD-10-CM | POA: Diagnosis present

## 2014-01-24 DIAGNOSIS — Z23 Encounter for immunization: Secondary | ICD-10-CM

## 2014-01-24 DIAGNOSIS — K219 Gastro-esophageal reflux disease without esophagitis: Secondary | ICD-10-CM | POA: Diagnosis present

## 2014-01-24 DIAGNOSIS — R000 Tachycardia, unspecified: Secondary | ICD-10-CM | POA: Diagnosis not present

## 2014-01-24 DIAGNOSIS — Z79899 Other long term (current) drug therapy: Secondary | ICD-10-CM | POA: Diagnosis not present

## 2014-01-24 DIAGNOSIS — D509 Iron deficiency anemia, unspecified: Secondary | ICD-10-CM | POA: Diagnosis present

## 2014-01-24 DIAGNOSIS — D126 Benign neoplasm of colon, unspecified: Secondary | ICD-10-CM

## 2014-01-24 DIAGNOSIS — R1013 Epigastric pain: Secondary | ICD-10-CM | POA: Diagnosis present

## 2014-01-24 DIAGNOSIS — M419 Scoliosis, unspecified: Secondary | ICD-10-CM | POA: Diagnosis not present

## 2014-01-24 DIAGNOSIS — Z9841 Cataract extraction status, right eye: Secondary | ICD-10-CM | POA: Diagnosis not present

## 2014-01-24 DIAGNOSIS — D72829 Elevated white blood cell count, unspecified: Secondary | ICD-10-CM

## 2014-01-24 HISTORY — DX: Tachycardia, unspecified: R00.0

## 2014-01-24 LAB — COMPREHENSIVE METABOLIC PANEL
ALBUMIN: 3.6 g/dL (ref 3.5–5.2)
ALT: 10 U/L (ref 0–35)
ANION GAP: 13 (ref 5–15)
AST: 17 U/L (ref 0–37)
Alkaline Phosphatase: 65 U/L (ref 39–117)
BUN: 19 mg/dL (ref 6–23)
CHLORIDE: 98 meq/L (ref 96–112)
CO2: 24 mEq/L (ref 19–32)
CREATININE: 0.86 mg/dL (ref 0.50–1.10)
Calcium: 9.4 mg/dL (ref 8.4–10.5)
GFR, EST AFRICAN AMERICAN: 73 mL/min — AB (ref >90)
GFR, EST NON AFRICAN AMERICAN: 63 mL/min — AB (ref >90)
Glucose, Bld: 121 mg/dL — ABNORMAL HIGH (ref 70–99)
Potassium: 4.9 mEq/L (ref 3.7–5.3)
Sodium: 135 mEq/L — ABNORMAL LOW (ref 137–147)
Total Protein: 7.6 g/dL (ref 6.0–8.3)

## 2014-01-24 LAB — URINALYSIS, ROUTINE W REFLEX MICROSCOPIC
Bilirubin Urine: NEGATIVE
Glucose, UA: NEGATIVE mg/dL
Hgb urine dipstick: NEGATIVE
Ketones, ur: NEGATIVE mg/dL
LEUKOCYTES UA: NEGATIVE
NITRITE: NEGATIVE
PROTEIN: NEGATIVE mg/dL
SPECIFIC GRAVITY, URINE: 1.007 (ref 1.005–1.030)
UROBILINOGEN UA: 0.2 mg/dL (ref 0.0–1.0)
pH: 6.5 (ref 5.0–8.0)

## 2014-01-24 LAB — CBC WITH DIFFERENTIAL/PLATELET
BASOS PCT: 1 % (ref 0–1)
Basophils Absolute: 0.1 10*3/uL (ref 0.0–0.1)
Eosinophils Absolute: 0 10*3/uL (ref 0.0–0.7)
Eosinophils Relative: 0 % (ref 0–5)
HEMATOCRIT: 39.9 % (ref 36.0–46.0)
HEMOGLOBIN: 12.6 g/dL (ref 12.0–15.0)
Lymphocytes Relative: 15 % (ref 12–46)
Lymphs Abs: 1.6 10*3/uL (ref 0.7–4.0)
MCH: 29.6 pg (ref 26.0–34.0)
MCHC: 31.6 g/dL (ref 30.0–36.0)
MCV: 93.9 fL (ref 78.0–100.0)
MONO ABS: 1.1 10*3/uL — AB (ref 0.1–1.0)
MONOS PCT: 10 % (ref 3–12)
NEUTROS ABS: 8.3 10*3/uL — AB (ref 1.7–7.7)
Neutrophils Relative %: 74 % (ref 43–77)
Platelets: 486 10*3/uL — ABNORMAL HIGH (ref 150–400)
RBC: 4.25 MIL/uL (ref 3.87–5.11)
RDW: 14.6 % (ref 11.5–15.5)
WBC: 11.1 10*3/uL — ABNORMAL HIGH (ref 4.0–10.5)

## 2014-01-24 LAB — LIPASE, BLOOD: LIPASE: 24 U/L (ref 11–59)

## 2014-01-24 LAB — POC OCCULT BLOOD, ED: FECAL OCCULT BLD: POSITIVE — AB

## 2014-01-24 LAB — I-STAT TROPONIN, ED: Troponin i, poc: 0 ng/mL (ref 0.00–0.08)

## 2014-01-24 MED ORDER — SODIUM CHLORIDE 0.9 % IV BOLUS (SEPSIS)
1000.0000 mL | Freq: Once | INTRAVENOUS | Status: AC
  Administered 2014-01-24: 1000 mL via INTRAVENOUS

## 2014-01-24 MED ORDER — FENTANYL CITRATE 0.05 MG/ML IJ SOLN
50.0000 ug | Freq: Once | INTRAMUSCULAR | Status: AC
  Administered 2014-01-24: 50 ug via INTRAVENOUS
  Filled 2014-01-24: qty 2

## 2014-01-24 MED ORDER — METRONIDAZOLE IN NACL 5-0.79 MG/ML-% IV SOLN
500.0000 mg | Freq: Once | INTRAVENOUS | Status: AC
  Administered 2014-01-24: 500 mg via INTRAVENOUS
  Filled 2014-01-24: qty 100

## 2014-01-24 MED ORDER — ONDANSETRON HCL 4 MG/2ML IJ SOLN
4.0000 mg | Freq: Once | INTRAMUSCULAR | Status: AC
  Administered 2014-01-24: 4 mg via INTRAVENOUS
  Filled 2014-01-24: qty 2

## 2014-01-24 MED ORDER — METRONIDAZOLE IN NACL 5-0.79 MG/ML-% IV SOLN
500.0000 mg | Freq: Three times a day (TID) | INTRAVENOUS | Status: DC
  Filled 2014-01-24: qty 100

## 2014-01-24 MED ORDER — IOHEXOL 300 MG/ML  SOLN
100.0000 mL | Freq: Once | INTRAMUSCULAR | Status: AC | PRN
  Administered 2014-01-24: 100 mL via INTRAVENOUS

## 2014-01-24 MED ORDER — ONDANSETRON HCL 4 MG PO TABS: 4.0000 mg | ORAL_TABLET | Freq: Four times a day (QID) | ORAL | Status: DC | PRN

## 2014-01-24 MED ORDER — ONDANSETRON HCL 4 MG/2ML IJ SOLN
4.0000 mg | Freq: Four times a day (QID) | INTRAMUSCULAR | Status: DC | PRN
  Administered 2014-01-24 – 2014-01-25 (×3): 4 mg via INTRAVENOUS
  Filled 2014-01-24 (×3): qty 2

## 2014-01-24 NOTE — ED Notes (Signed)
Per EMS, pt from home.  Pt c/o abdominal pain, n/v/d x 2 days.  Pt lives alone.  Not able to keep anything down.  Pt has hx of c-diff in past and concerned for same.  HX: back surgeries, nerve damage in back.  Vitals:  140/82, hr 100, resp 18, pain  7/10.

## 2014-01-24 NOTE — ED Notes (Signed)
Patient is aware that a stool sample is needed.

## 2014-01-24 NOTE — H&P (Signed)
[facility] History and Physical  [name] MWN:[id number] DOB: 07/12/1932 DOA: 01/24/2014  Referring physician: ED physician PCP: [name], MD   Chief Complaint: abd pain, dark stools, N/V  HPI:  Pt is 80 yo female with HTN, history of C. Diff, diverticulosis, presented to [facility] ED with main concern of several days duration of progressively worsening epigastric pain, throbbing and intermittent, 5/10 in severity, non radiating, associated with dark stools, watery diarrhea, nausea, poor oral intake. Pt denies any specific alleviating factors, reports similar events in the past when she was diagnosed with C. Diff colitis. She denies fevers, chills, chest pain or shortness of breath. No sick contacts or exposures.   In ED, pt hemodynamically stable, VSS except mild tachycardia with HR in 120's. CT abd with ? Colitis, diverticulosis. C. Diff requested, FOBT +. TRH asked to admit to telemetry bed for further evaluation.   Assessment and Plan: Active Problems: Abd pain with N/V - appears to be secondary to colitis - C. Diff pending - place on Flagyl - provide IVF, analgesia, antiemetics as needed  Colitis - Flagyl as noted above  Dark stools - possibly secondary to diverticulosis - FOBT + - monitor H/H and call GI if indicated in AM  Leukocytosis - secondary to colitis - Flagyl as noted above  - repeat CBC In AM Tachycardia - secondary to colitis - monitor on tele for 24 hours - continue Metoprolol HTN - continue Metoprolol  SCD's for DVT prophylaxis  Radiological Exams on Admission:  Dg Chest 2 View  01/24/2014  No active cardiopulmonary disease.  Moderate size hiatal hernia.     Ct Abdomen Pelvis W Contrast  01/24/2014   No acute abnormality. Previously identified splenic deformities and infarcts less marked on today's exam. Small 1 cm rounded density noted may represent a small splenic infarct or vascular lesion. Splenic tumor less likely statistically. Mild  thickening noted of the sigmoid colon. This may be related to the extensive diverticulosis present. Mild colitis cannot be excluded. Previously identified changes of severe colitis and resolved.  Coronary artery disease.     Code Status: Full Family Communication: Pt at bedside Disposition Plan: Admit for further evaluation     Review of Systems:  Constitutional: Negative for fever, chills and malaise/fatigue. Negative for diaphoresis.  HENT: Negative for hearing loss, ear pain, nosebleeds, congestion, sore throat, neck pain, tinnitus and ear discharge.   Eyes: Negative for blurred vision, double vision, photophobia, pain, discharge and redness.  Respiratory: Negative for cough, hemoptysis, sputum production, shortness of breath, wheezing and stridor.   Cardiovascular: Negative for chest pain, palpitations, orthopnea, claudication and leg swelling.  Gastrointestinal: per HPI  Genitourinary: Negative for dysuria, urgency, frequency, hematuria and flank pain.  Musculoskeletal: Negative for myalgias, back pain, joint pain and falls.  Skin: Negative for itching and rash.  Neurological: Negative for dizziness and weakness.  Endo/Heme/Allergies: Negative for environmental allergies and polydipsia. Does not bruise/bleed easily.  Psychiatric/Behavioral: Negative for suicidal ideas. The patient is not nervous/anxious.      Past Medical History  Diagnosis Date  . Arthritis 10-05-12    degenerative joint disease,scoliosis spine  . Dysrhythmia 10-05-12    tachycardia -tx. Lopressor  . GERD (gastroesophageal reflux disease)   . H/O hiatal hernia   . Anemia 10-05-12    hgb-9.0 on 09-14-12  . Arm vein blood clot 10-05-12    Rt. arm '12  . Transfusion history 10-05-12    4 units blood '12  . H/O esophagitis   .  Tachycardia     Past Surgical History  Procedure Laterality Date  . Abdominal hysterectomy    . Appendectomy      child  . Cataract extraction, bilateral    . Back surgery  10-05-12     x5-last fusion with plates  . Breast enhancement surgery    . Colonoscopy with propofol N/A 10/23/2012    Procedure: COLONOSCOPY WITH PROPOFOL;  Surgeon: Lear Ng, MD;  Location: [facility];  Service: Endoscopy;  Laterality: N/A;  . Hot hemostasis N/A 10/23/2012    Procedure: HOT HEMOSTASIS (ARGON PLASMA COAGULATION/BICAP);  Surgeon: Lear Ng, MD;  Location: [facility];  Service: Endoscopy;  Laterality: N/A;    Social History:  reports that she has never smoked. She does not have any smokeless tobacco history on file. She reports that she does not drink alcohol or use illicit drugs.  Allergies  Allergen Reactions  . Ciprofloxacin Rash    burning    No significant family medical history   Prior to Admission medications   Medication Sig Start Date End Date Taking? Authorizing Provider  aspirin 81 MG tablet Take 81 mg by mouth daily.   Yes Historical Provider, MD  ENSURE PLUS (ENSURE PLUS) LIQD Take 237 mLs by mouth 2 (two) times daily.   Yes Historical Provider, MD  metoprolol (LOPRESSOR) 50 MG tablet Take 50 mg by mouth 2 (two) times daily.   Yes Historical Provider, MD  oxyCODONE (OXYCONTIN) 40 MG 12 hr tablet Take 40 mg by mouth every 8 (eight) hours.   Yes Historical Provider, MD  pantoprazole (PROTONIX) 40 MG tablet Take 40 mg by mouth 2 (two) times daily.   Yes Historical Provider, MD    Physical Exam: Filed Vitals:   01/24/14 1814 01/24/14 2149  BP: 130/67 137/68  Pulse: 84 97  Temp: 97.8 F (36.6 C)   TempSrc: Oral   Resp: 22 18  SpO2: 95% 94%    Physical Exam  Constitutional: Appears well-developed and well-nourished. No distress.  HENT: Normocephalic. External right and left ear normal. Dry MM Eyes: Conjunctivae and EOM are normal. PERRLA, no scleral icterus.  Neck: Normal ROM. Neck supple. No JVD. No tracheal deviation. No thyromegaly.  CVS: RRR, S1/S2 +, no murmurs, no gallops, no carotid bruit.  Pulmonary: Effort and breath sounds normal,  no stridor, rhonchi, wheezes, rales.  Abdominal: Soft. BS +,  no distension, tenderness in lower abd quadrants, no rebound or guarding.  Musculoskeletal: Normal range of motion. No edema and no tenderness.  Lymphadenopathy: No lymphadenopathy noted, cervical, inguinal. Neuro: Alert. Normal reflexes, muscle tone coordination. No cranial nerve deficit. Skin: Skin is warm and dry. No rash noted. Not diaphoretic. No erythema. No pallor.  Psychiatric: Normal mood and affect. Behavior, judgment, thought content normal.   Labs on Admission:  Basic Metabolic Panel:  Recent Labs Lab 01/24/14 1842  NA 135*  K 4.9  CL 98  CO2 24  GLUCOSE 121*  BUN 19  CREATININE 0.86  CALCIUM 9.4   Liver Function Tests:  Recent Labs Lab 01/24/14 1842  AST 17  ALT 10  ALKPHOS 65  BILITOT <0.2*  PROT 7.6  ALBUMIN 3.6    Recent Labs Lab 01/24/14 1842  LIPASE 24   CBC:  Recent Labs Lab 01/24/14 1842  WBC 11.1*  NEUTROABS 8.3*  HGB 12.6  HCT 39.9  MCV 93.9  PLT 486*    EKG: Normal sinus rhythm, no ST/T wave changes  MAGICK-[name], Rebecca Eaton, MD  [facility] Pager  [phone]  If 7PM-7AM, please contact night-coverage [email or web address] Password [email or web address] 01/24/2014, 9:54 PM

## 2014-01-24 NOTE — ED Provider Notes (Signed)
CSN: [id number]     Arrival date & time 01/24/14  1810 History   First MD Initiated Contact with Patient 01/24/14 [place]     Chief Complaint  Patient presents with  . Emesis     (Consider location/radiation/quality/duration/timing/severity/associated sxs/prior Treatment) The history is provided by the patient.  [name] is a 80 y.o. female hx of anemia, C diff here with ab pain, nausea, diarrhea. Has hx of C diff several years ago. Chronically constipated but over the last 2 days, she has black stools. She is on iron but this is darker than usual. No obvious watery diarrhea, no recent abx. No fever. Also felt nauseated and vomited several times. States that pain radiate to left side of her check.    Past Medical History  Diagnosis Date  . Arthritis 10-05-12    degenerative joint disease,scoliosis spine  . Dysrhythmia 10-05-12    tachycardia -tx. Lopressor  . GERD (gastroesophageal reflux disease)   . H/O hiatal hernia   . Anemia 10-05-12    hgb-9.0 on 09-14-12  . Arm vein blood clot 10-05-12    Rt. arm '12  . Transfusion history 10-05-12    4 units blood '12  . H/O esophagitis   . Tachycardia    Past Surgical History  Procedure Laterality Date  . Abdominal hysterectomy    . Appendectomy      child  . Cataract extraction, bilateral    . Back surgery  10-05-12    x5-last fusion with plates  . Breast enhancement surgery    . Colonoscopy with propofol N/A 10/23/2012    Procedure: COLONOSCOPY WITH PROPOFOL;  Surgeon: Lear Ng, MD;  Location: [facility];  Service: Endoscopy;  Laterality: N/A;  . Hot hemostasis N/A 10/23/2012    Procedure: HOT HEMOSTASIS (ARGON PLASMA COAGULATION/BICAP);  Surgeon: Lear Ng, MD;  Location: [facility];  Service: Endoscopy;  Laterality: N/A;   History reviewed. No pertinent family history. History  Substance Use Topics  . Smoking status: Never Smoker   . Smokeless tobacco: Not on file  . Alcohol Use: No   OB History   Grav Para Term Preterm Abortions TAB SAB Ect Mult Living                 Review of Systems  Cardiovascular: Positive for chest pain.  Gastrointestinal: Positive for nausea, vomiting and abdominal pain.  All other systems reviewed and are negative.     Allergies  Ciprofloxacin  Home Medications   Prior to Admission medications   Medication Sig Start Date End Date Taking? Authorizing Provider  aspirin 81 MG tablet Take 81 mg by mouth daily.   Yes Historical Provider, MD  ENSURE PLUS (ENSURE PLUS) LIQD Take 237 mLs by mouth 2 (two) times daily.   Yes Historical Provider, MD  metoprolol (LOPRESSOR) 50 MG tablet Take 50 mg by mouth 2 (two) times daily.   Yes Historical Provider, MD  oxyCODONE (OXYCONTIN) 40 MG 12 hr tablet Take 40 mg by mouth every 8 (eight) hours.   Yes Historical Provider, MD  pantoprazole (PROTONIX) 40 MG tablet Take 40 mg by mouth 2 (two) times daily.   Yes Historical Provider, MD   BP 130/67  Pulse 84  Temp(Src) 97.8 F (36.6 C) (Oral)  Resp 22  SpO2 95% Physical Exam  Nursing note and vitals reviewed. Constitutional: She is oriented to person, place, and time.  Chronically ill, dehydrated   HENT:  Head: Normocephalic.  MM dry   Eyes:  Conjunctivae are normal. Pupils are equal, round, and reactive to light.  Neck: Normal range of motion. Neck supple.  Cardiovascular: Normal rate, regular rhythm and normal heart sounds.   Pulmonary/Chest: Effort normal and breath sounds normal. No respiratory distress. She has no wheezes. She has no rales.  Abdominal: Soft. Bowel sounds are normal.  + epigastric tenderness, no rebound   Musculoskeletal: Normal range of motion. She exhibits no edema and no tenderness.  Neurological: She is alert and oriented to person, place, and time. No cranial nerve deficit. Coordination normal.  Skin: Skin is warm.  Psychiatric: She has a normal mood and affect. Her behavior is normal. Judgment and thought content normal.    ED  Course  Procedures (including critical care time) Labs Review Labs Reviewed  CBC WITH DIFFERENTIAL - Abnormal; Notable for the following:    WBC 11.1 (*)    Platelets 486 (*)    Neutro Abs 8.3 (*)    Monocytes Absolute 1.1 (*)    All other components within normal limits  COMPREHENSIVE METABOLIC PANEL - Abnormal; Notable for the following:    Sodium 135 (*)    Glucose, Bld 121 (*)    Total Bilirubin <0.2 (*)    GFR calc non Af Amer 63 (*)    GFR calc Af Amer 73 (*)    All other components within normal limits  POC OCCULT BLOOD, ED - Abnormal; Notable for the following:    Fecal Occult Bld POSITIVE (*)    All other components within normal limits  CLOSTRIDIUM DIFFICILE BY PCR  LIPASE, BLOOD  URINALYSIS, ROUTINE W REFLEX MICROSCOPIC  I-STAT TROPOININ, ED    Imaging Review Dg Chest 2 View  01/24/2014   CLINICAL DATA:  Nausea, chest pain  EXAM: CHEST  2 VIEW  COMPARISON:  09/24/2010  FINDINGS: Cardiomediastinal silhouette is stable. There is elevation of the right hemidiaphragm. No acute infiltrate or pulmonary edema. Moderate size hiatal hernia.  IMPRESSION: No active cardiopulmonary disease.  Moderate size hiatal hernia.   Electronically Signed   By: Lahoma Crocker M.D.   On: 01/24/2014 20:11   Ct Abdomen Pelvis W Contrast  01/24/2014   CLINICAL DATA:  Abdominal pain. Nausea, vomiting, and diarrhea x2 days.  EXAM: CT ABDOMEN AND PELVIS WITH CONTRAST  TECHNIQUE: Multidetector CT imaging of the abdomen and pelvis was performed using the standard protocol following bolus administration of intravenous contrast.  CONTRAST:  1107mL OMNIPAQUE IOHEXOL 300 MG/ML  SOLN  COMPARISON:  Abdomen series 6 09/2010.  CT abdomen 05/21/ 2012.  FINDINGS: Stable simple cyst right lobe of liver. Small granulomas in the liver. Liver otherwise unremarkable. Previously identified deformity of the spleen with possible multiple splenic infarcts less marked on today's examination. 1 cm lucency noted in the midportion of  the spleen is noted. This could represent a focal splenic infarct. Small vascular lesion could also present in this fashion. Splenic tumor would statistically be less likely. Pancreas is unremarkable. No biliary distention. The gallbladder is nondistended.  Adrenals normal. Kidneys are unremarkable. No hydronephrosis. No obstructing ureteral stone. The bladder is nondistended. Hysterectomy. No pelvic mass. No free pelvic fluid collection.  No adenopathy. Abdominal aorta is widely patent. Visceral vessels are patent. Portal vein patent.  Appendectomy. Severe sigmoid colonic diverticulosis. Mild thickening of the sigmoid colon noted. Mild colitis cannot be excluded. Previously identified severe changes of colitis have resolved. No bowel distention. Hiatal hernia again noted. No free air.  Bibasilar pleural parenchymal scarring. Mild cardiomegaly. Coronary artery disease.  Bilateral breast implants. Degenerative changes and postsurgical changes thoracolumbar spine. No acute bony abnormality.  IMPRESSION: 1. No acute abnormality. 2. Previously identified splenic deformities and infarcts less marked on today's exam. Small 1 cm rounded density noted may represent a small splenic infarct or vascular lesion. Splenic tumor less likely statistically. 3. Mild thickening noted of the sigmoid colon. This may be related to the extensive diverticulosis present. Mild colitis cannot be excluded. Previously identified changes of severe colitis and resolved. 4. Coronary artery disease.   Electronically Signed   By: Marcello Moores  Register   On: 01/24/2014 21:26     EKG Interpretation   Date/Time:  Friday January 24 2014 18:15:29 EDT Ventricular Rate:  81 PR Interval:  169 QRS Duration: 71 QT Interval:  343 QTC Calculation: 398 R Axis:   -13 Text Interpretation:  Sinus rhythm Anterior infarct, old Nonspecific T  abnormalities, lateral leads No significant change since last tracing  Confirmed by YAO  MD, DAVID ([id number]) on  01/24/2014 6:48:22 PM      MDM   Final diagnoses:  None   [name] is a 80 y.o. female here with vomiting, loose stools, melena. Concerned for GI bleed vs C diff. Will check labs, C diff. Will get CT ab/pel.   9:47 PM Occ positive. CT showed colitis. Given flagyl for C diff empirically. Patient still tachy, will admit for dehydration, possible C diff.      Wandra Arthurs, MD 01/24/14 2155

## 2014-01-24 NOTE — ED Notes (Signed)
Bed: WA04 Expected date:  Expected time:  Means of arrival:  Comments: EMS-N/V 

## 2014-01-25 DIAGNOSIS — K529 Noninfective gastroenteritis and colitis, unspecified: Principal | ICD-10-CM

## 2014-01-25 DIAGNOSIS — E860 Dehydration: Secondary | ICD-10-CM

## 2014-01-25 LAB — CBC
HCT: 33.4 % — ABNORMAL LOW (ref 36.0–46.0)
Hemoglobin: 10.9 g/dL — ABNORMAL LOW (ref 12.0–15.0)
MCH: 30.8 pg (ref 26.0–34.0)
MCHC: 32.6 g/dL (ref 30.0–36.0)
MCV: 94.4 fL (ref 78.0–100.0)
PLATELETS: 422 10*3/uL — AB (ref 150–400)
RBC: 3.54 MIL/uL — AB (ref 3.87–5.11)
RDW: 14.9 % (ref 11.5–15.5)
WBC: 9.3 10*3/uL (ref 4.0–10.5)

## 2014-01-25 LAB — BASIC METABOLIC PANEL
Anion gap: 10 (ref 5–15)
BUN: 15 mg/dL (ref 6–23)
CALCIUM: 8.8 mg/dL (ref 8.4–10.5)
CO2: 25 mEq/L (ref 19–32)
Chloride: 103 mEq/L (ref 96–112)
Creatinine, Ser: 0.79 mg/dL (ref 0.50–1.10)
GFR calc Af Amer: 89 mL/min — ABNORMAL LOW (ref >90)
GFR, EST NON AFRICAN AMERICAN: 77 mL/min — AB (ref >90)
Glucose, Bld: 111 mg/dL — ABNORMAL HIGH (ref 70–99)
POTASSIUM: 4.7 meq/L (ref 3.7–5.3)
SODIUM: 138 meq/L (ref 137–147)

## 2014-01-25 LAB — URINALYSIS, ROUTINE W REFLEX MICROSCOPIC
BILIRUBIN URINE: NEGATIVE
Glucose, UA: NEGATIVE mg/dL
HGB URINE DIPSTICK: NEGATIVE
KETONES UR: NEGATIVE mg/dL
Leukocytes, UA: NEGATIVE
Nitrite: NEGATIVE
Protein, ur: NEGATIVE mg/dL
Specific Gravity, Urine: 1.023 (ref 1.005–1.030)
Urobilinogen, UA: 0.2 mg/dL (ref 0.0–1.0)
pH: 7 (ref 5.0–8.0)

## 2014-01-25 LAB — TSH: TSH: 2.48 u[IU]/mL (ref 0.350–4.500)

## 2014-01-25 MED ORDER — ASPIRIN 81 MG PO CHEW
81.0000 mg | CHEWABLE_TABLET | Freq: Every day | ORAL | Status: DC
  Administered 2014-01-25 – 2014-01-27 (×3): 81 mg via ORAL
  Filled 2014-01-25 (×3): qty 1

## 2014-01-25 MED ORDER — METOPROLOL TARTRATE 50 MG PO TABS
50.0000 mg | ORAL_TABLET | Freq: Two times a day (BID) | ORAL | Status: DC
  Administered 2014-01-25 – 2014-01-27 (×6): 50 mg via ORAL
  Filled 2014-01-25 (×8): qty 1

## 2014-01-25 MED ORDER — OXYCODONE HCL 5 MG PO TABS
15.0000 mg | ORAL_TABLET | Freq: Four times a day (QID) | ORAL | Status: DC | PRN
  Administered 2014-01-25 – 2014-01-27 (×7): 15 mg via ORAL
  Filled 2014-01-25 (×7): qty 3

## 2014-01-25 MED ORDER — METRONIDAZOLE IN NACL 5-0.79 MG/ML-% IV SOLN
500.0000 mg | Freq: Three times a day (TID) | INTRAVENOUS | Status: DC
  Administered 2014-01-25 (×2): 500 mg via INTRAVENOUS
  Filled 2014-01-25 (×3): qty 100

## 2014-01-25 MED ORDER — OXYCODONE HCL ER 20 MG PO T12A
40.0000 mg | EXTENDED_RELEASE_TABLET | Freq: Three times a day (TID) | ORAL | Status: DC
  Administered 2014-01-25 – 2014-01-27 (×8): 40 mg via ORAL
  Filled 2014-01-25 (×8): qty 2

## 2014-01-25 MED ORDER — PANTOPRAZOLE SODIUM 40 MG PO TBEC
40.0000 mg | DELAYED_RELEASE_TABLET | Freq: Two times a day (BID) | ORAL | Status: DC
  Administered 2014-01-25 (×2): 40 mg via ORAL
  Filled 2014-01-25 (×3): qty 1

## 2014-01-25 MED ORDER — SODIUM CHLORIDE 0.9 % IV SOLN
INTRAVENOUS | Status: AC
  Administered 2014-01-25: 02:00:00 via INTRAVENOUS

## 2014-01-25 MED ORDER — SODIUM CHLORIDE 0.9 % IJ SOLN
3.0000 mL | Freq: Two times a day (BID) | INTRAMUSCULAR | Status: DC
  Administered 2014-01-26: 3 mL via INTRAVENOUS

## 2014-01-25 MED ORDER — OXYCODONE HCL ER 20 MG PO T12A: 40.0000 mg | EXTENDED_RELEASE_TABLET | Freq: Three times a day (TID) | ORAL | Status: DC

## 2014-01-25 MED ORDER — ONDANSETRON HCL 4 MG PO TABS
4.0000 mg | ORAL_TABLET | ORAL | Status: DC | PRN
  Administered 2014-01-26 – 2014-01-27 (×2): 4 mg via ORAL
  Filled 2014-01-25 (×2): qty 1

## 2014-01-25 MED ORDER — ONDANSETRON HCL 4 MG/2ML IJ SOLN
4.0000 mg | INTRAMUSCULAR | Status: DC | PRN
  Administered 2014-01-25 – 2014-01-26 (×6): 4 mg via INTRAVENOUS
  Filled 2014-01-25 (×6): qty 2

## 2014-01-25 MED ORDER — METRONIDAZOLE 500 MG PO TABS
500.0000 mg | ORAL_TABLET | Freq: Three times a day (TID) | ORAL | Status: DC
  Administered 2014-01-25 – 2014-01-27 (×6): 500 mg via ORAL
  Filled 2014-01-25 (×9): qty 1

## 2014-01-25 MED ORDER — SODIUM CHLORIDE 0.9 % IV SOLN
INTRAVENOUS | Status: DC
  Administered 2014-01-26 (×2): via INTRAVENOUS

## 2014-01-25 NOTE — Progress Notes (Addendum)
[facility] PROGRESS NOTE  ALLEN BASISTA TML:[id number] DOB: 19-Sep-1932 DOA: 01/24/2014 PCP: [name], MD  Assessment/Plan  Abd pain with N/V due to colitis, improving - C. Diff pending  -change to PO Flagyl  - continue IVF, analgesia, antiemetics as needed  -  Continue home oxycontin 40mg  TID -  Add oxycodone 15mg  po q4h prn pain -  D/c protonix until C. Diff ruled out  Dark stools  - possibly secondary to diverticulosis  - FOBT +  - H&H trending down slightly but most likely hemodilutional.  No further BMs  Leukocytosis, resolved with abx and hydration  Tachycardia resolved with hydration  HTN, stable, continue Metoprolol  Normocytic anemia likely due to dilution and chronic disease.  Hx of iron deficiency  Diet:  regular Access:  PIV IVF:  yes Proph:  SCDs  Code Status: full Family Communication: patient alone Disposition Plan: pending diarrhea slowed, tolerating diet, c. Diff test complete, hgb stable   Consultants:  none  Procedures:  CT:  1 cm splenic density likely infarct or vascular lesion, mild sigmoid thickening with possible mild colitis, CAD  Antibiotics:  flagyl   HPI/Subjective:  Still having nausea with abdominal pain.    Objective: Filed Vitals:   01/25/14 0030 01/25/14 0102 01/25/14 0627 01/25/14 1327  BP: 129/62 153/85 112/45 120/58  Pulse:  65 81 80  Temp:  98 F (36.7 C) 98.2 F (36.8 C) 98.2 F (36.8 C)  TempSrc:  Oral Oral Oral  Resp: 15 14 14 16   Height:  5\' 3"  (1.6 m)    Weight:  63.5 kg (139 lb 15.9 oz)    SpO2:   93% 94%    Intake/Output Summary (Last 24 hours) at 01/25/14 1809 Last data filed at 01/25/14 1328  Gross per 24 hour  Intake 1963.75 ml  Output    875 ml  Net 1088.75 ml   Filed Weights   01/25/14 0102  Weight: 63.5 kg (139 lb 15.9 oz)    Exam:   General:  WF, No acute distress  HEENT:  NCAT, MMM  Cardiovascular:  RRR, nl S1, S2 no mrg, 2+ pulses, warm extremities  Respiratory:   CTAB, no increased WOB  Abdomen:   Hyperactive BS, soft, ND, mild TTP diffusely without rebound or guarding, worse in epigastric area  MSK:   Normal tone and bulk, no LEE  Neuro:  Grossly intact  Data Reviewed: Basic Metabolic Panel:  Recent Labs Lab 01/24/14 1842 01/25/14 0542  NA 135* 138  K 4.9 4.7  CL 98 103  CO2 24 25  GLUCOSE 121* 111*  BUN 19 15  CREATININE 0.86 0.79  CALCIUM 9.4 8.8   Liver Function Tests:  Recent Labs Lab 01/24/14 1842  AST 17  ALT 10  ALKPHOS 65  BILITOT <0.2*  PROT 7.6  ALBUMIN 3.6    Recent Labs Lab 01/24/14 1842  LIPASE 24   No results found for this basename: AMMONIA,  in the last 168 hours CBC:  Recent Labs Lab 01/24/14 1842 01/25/14 0542  WBC 11.1* 9.3  NEUTROABS 8.3*  --   HGB 12.6 10.9*  HCT 39.9 33.4*  MCV 93.9 94.4  PLT 486* 422*   Cardiac Enzymes: No results found for this basename: CKTOTAL, CKMB, CKMBINDEX, TROPONINI,  in the last 168 hours BNP (last 3 results) No results found for this basename: PROBNP,  in the last 8760 hours CBG: No results found for this basename: GLUCAP,  in the last 168 hours  No results  found for this or any previous visit (from the past 240 hour(s)).   Studies: Dg Chest 2 View  01/24/2014   CLINICAL DATA:  Nausea, chest pain  EXAM: CHEST  2 VIEW  COMPARISON:  09/24/2010  FINDINGS: Cardiomediastinal silhouette is stable. There is elevation of the right hemidiaphragm. No acute infiltrate or pulmonary edema. Moderate size hiatal hernia.  IMPRESSION: No active cardiopulmonary disease.  Moderate size hiatal hernia.   Electronically Signed   By: Lahoma Crocker M.D.   On: 01/24/2014 20:11   Ct Abdomen Pelvis W Contrast  01/24/2014   CLINICAL DATA:  Abdominal pain. Nausea, vomiting, and diarrhea x2 days.  EXAM: CT ABDOMEN AND PELVIS WITH CONTRAST  TECHNIQUE: Multidetector CT imaging of the abdomen and pelvis was performed using the standard protocol following bolus administration of intravenous  contrast.  CONTRAST:  151mL OMNIPAQUE IOHEXOL 300 MG/ML  SOLN  COMPARISON:  Abdomen series 6 09/2010.  CT abdomen 05/21/ 2012.  FINDINGS: Stable simple cyst right lobe of liver. Small granulomas in the liver. Liver otherwise unremarkable. Previously identified deformity of the spleen with possible multiple splenic infarcts less marked on today's examination. 1 cm lucency noted in the midportion of the spleen is noted. This could represent a focal splenic infarct. Small vascular lesion could also present in this fashion. Splenic tumor would statistically be less likely. Pancreas is unremarkable. No biliary distention. The gallbladder is nondistended.  Adrenals normal. Kidneys are unremarkable. No hydronephrosis. No obstructing ureteral stone. The bladder is nondistended. Hysterectomy. No pelvic mass. No free pelvic fluid collection.  No adenopathy. Abdominal aorta is widely patent. Visceral vessels are patent. Portal vein patent.  Appendectomy. Severe sigmoid colonic diverticulosis. Mild thickening of the sigmoid colon noted. Mild colitis cannot be excluded. Previously identified severe changes of colitis have resolved. No bowel distention. Hiatal hernia again noted. No free air.  Bibasilar pleural parenchymal scarring. Mild cardiomegaly. Coronary artery disease. Bilateral breast implants. Degenerative changes and postsurgical changes thoracolumbar spine. No acute bony abnormality.  IMPRESSION: 1. No acute abnormality. 2. Previously identified splenic deformities and infarcts less marked on today's exam. Small 1 cm rounded density noted may represent a small splenic infarct or vascular lesion. Splenic tumor less likely statistically. 3. Mild thickening noted of the sigmoid colon. This may be related to the extensive diverticulosis present. Mild colitis cannot be excluded. Previously identified changes of severe colitis and resolved. 4. Coronary artery disease.   Electronically Signed   By: Marcello Moores  Register   On:  01/24/2014 21:26    Scheduled Meds: . aspirin  81 mg Oral Daily  . metoprolol  50 mg Oral BID  . metroNIDAZOLE  500 mg Oral 3 times per day  . OxyCODONE  40 mg Oral Q8H  . pantoprazole  40 mg Oral BID  . sodium chloride  3 mL Intravenous Q12H   Continuous Infusions:   Active Problems:   Colitis    Time spent: 30 min    Aleene Swanner, [facility] Pager [phone]. If 7PM-7AM, please contact night-coverage at [email or web address], password [facility] 01/25/2014, 6:09 PM  LOS: 1 day

## 2014-01-25 NOTE — ED Notes (Signed)
Called unit to give report to nurse. Nurse is not available for report.

## 2014-01-26 LAB — CBC
HCT: 36.4 % (ref 36.0–46.0)
Hemoglobin: 11.3 g/dL — ABNORMAL LOW (ref 12.0–15.0)
MCH: 29.3 pg (ref 26.0–34.0)
MCHC: 31 g/dL (ref 30.0–36.0)
MCV: 94.3 fL (ref 78.0–100.0)
Platelets: 470 10*3/uL — ABNORMAL HIGH (ref 150–400)
RBC: 3.86 MIL/uL — ABNORMAL LOW (ref 3.87–5.11)
RDW: 15.2 % (ref 11.5–15.5)
WBC: 8.5 10*3/uL (ref 4.0–10.5)

## 2014-01-26 LAB — URINALYSIS, ROUTINE W REFLEX MICROSCOPIC
BILIRUBIN URINE: NEGATIVE
Glucose, UA: NEGATIVE mg/dL
HGB URINE DIPSTICK: NEGATIVE
KETONES UR: NEGATIVE mg/dL
Leukocytes, UA: NEGATIVE
Nitrite: NEGATIVE
Protein, ur: NEGATIVE mg/dL
Specific Gravity, Urine: 1.012 (ref 1.005–1.030)
UROBILINOGEN UA: 0.2 mg/dL (ref 0.0–1.0)
pH: 7.5 (ref 5.0–8.0)

## 2014-01-26 LAB — BASIC METABOLIC PANEL
Anion gap: 10 (ref 5–15)
BUN: 12 mg/dL (ref 6–23)
CALCIUM: 8.9 mg/dL (ref 8.4–10.5)
CO2: 26 mEq/L (ref 19–32)
CREATININE: 0.78 mg/dL (ref 0.50–1.10)
Chloride: 102 mEq/L (ref 96–112)
GFR calc Af Amer: 90 mL/min — ABNORMAL LOW (ref >90)
GFR, EST NON AFRICAN AMERICAN: 77 mL/min — AB (ref >90)
GLUCOSE: 113 mg/dL — AB (ref 70–99)
Potassium: 4.2 mEq/L (ref 3.7–5.3)
SODIUM: 138 meq/L (ref 137–147)

## 2014-01-26 LAB — URINE CULTURE: Colony Count: 70000

## 2014-01-26 MED ORDER — INFLUENZA VAC SPLIT QUAD 0.5 ML IM SUSY
0.5000 mL | PREFILLED_SYRINGE | INTRAMUSCULAR | Status: AC
  Administered 2014-01-27: 0.5 mL via INTRAMUSCULAR
  Filled 2014-01-26 (×2): qty 0.5

## 2014-01-26 NOTE — Progress Notes (Signed)
[facility] PROGRESS NOTE  [name] HGD:[id number] DOB: May 15, 1932 DOA: 01/24/2014 PCP: [name], MD  Assessment/Plan  Abd pain with N/V due to colitis, improved. - C. Diff not obtained because no BMs  - Continue PO Flagyl  - continue IVF, analgesia, antiemetics as needed  -  Continue home oxycontin 40mg  TID -  Continue oxycodone 15mg  po q4h prn pain -  protonix on hold for presumptive C. Diff colitis  Dark stools  - possibly secondary to diverticulosis  - FOBT +  - hemoglobin stable  Leukocytosis, resolved with abx and hydration  Tachycardia resolved with hydration  HTN, stable, continue Metoprolol  Normocytic anemia likely due to dilution and chronic disease.  Hx of iron deficiency  Diet:  regular Access:  PIV IVF:  off Proph:  SCDs  Code Status: full Family Communication: patient alone Disposition Plan:  Patient medically ready for discharge, but her caretaker is not available until tomorrow morning.  Plan to discharge to home in care of caretaker tomorrow AM.     Consultants:  none  Procedures:  CT:  1 cm splenic density likely infarct or vascular lesion, mild sigmoid thickening with possible mild colitis, CAD  Antibiotics:  flagyl 10/9 >>  HPI/Subjective:  Still having nausea with abdominal pain, but improving.  Tolerating ensure and oral flagyl.    Objective: Filed Vitals:   01/25/14 2159 01/26/14 0500 01/26/14 0932 01/26/14 1259  BP: 145/66 137/48 131/53 148/62  Pulse: 91 72 81 83  Temp: 98.4 F (36.9 C) 97.4 F (36.3 C)  98.2 F (36.8 C)  TempSrc: Oral Oral  Oral  Resp: 20 20  18   Height:      Weight:      SpO2: 96% 95%  97%    Intake/Output Summary (Last 24 hours) at 01/26/14 1517 Last data filed at 01/26/14 1313  Gross per 24 hour  Intake 1411.25 ml  Output   1525 ml  Net -113.75 ml   Filed Weights   01/25/14 0102  Weight: 63.5 kg (139 lb 15.9 oz)    Exam:   General:  WF, No acute distress  HEENT:  NCAT,  MMM  Cardiovascular:  RRR, nl S1, S2 no mrg, 2+ pulses, warm extremities  Respiratory:  CTAB, no increased WOB  Abdomen:   NABS, soft, ND, mild TTP diffusely without rebound or guarding, worse in epigastric area  MSK:   Normal tone and bulk, no LEE  Neuro:  Grossly intact  Data Reviewed: Basic Metabolic Panel:  Recent Labs Lab 01/24/14 1842 01/25/14 0542 01/26/14 0500  NA 135* 138 138  K 4.9 4.7 4.2  CL 98 103 102  CO2 24 25 26   GLUCOSE 121* 111* 113*  BUN 19 15 12   CREATININE 0.86 0.79 0.78  CALCIUM 9.4 8.8 8.9   Liver Function Tests:  Recent Labs Lab 01/24/14 1842  AST 17  ALT 10  ALKPHOS 65  BILITOT <0.2*  PROT 7.6  ALBUMIN 3.6    Recent Labs Lab 01/24/14 1842  LIPASE 24   No results found for this basename: AMMONIA,  in the last 168 hours CBC:  Recent Labs Lab 01/24/14 1842 01/25/14 0542 01/26/14 0500  WBC 11.1* 9.3 8.5  NEUTROABS 8.3*  --   --   HGB 12.6 10.9* 11.3*  HCT 39.9 33.4* 36.4  MCV 93.9 94.4 94.3  PLT 486* 422* 470*   Cardiac Enzymes: No results found for this basename: CKTOTAL, CKMB, CKMBINDEX, TROPONINI,  in the last 168 hours BNP (last  3 results) No results found for this basename: PROBNP,  in the last 8760 hours CBG: No results found for this basename: GLUCAP,  in the last 168 hours  No results found for this or any previous visit (from the past 240 hour(s)).   Studies: Dg Chest 2 View  01/24/2014   CLINICAL DATA:  Nausea, chest pain  EXAM: CHEST  2 VIEW  COMPARISON:  09/24/2010  FINDINGS: Cardiomediastinal silhouette is stable. There is elevation of the right hemidiaphragm. No acute infiltrate or pulmonary edema. Moderate size hiatal hernia.  IMPRESSION: No active cardiopulmonary disease.  Moderate size hiatal hernia.   Electronically Signed   By: Lahoma Crocker M.D.   On: 01/24/2014 20:11   Ct Abdomen Pelvis W Contrast  01/24/2014   CLINICAL DATA:  Abdominal pain. Nausea, vomiting, and diarrhea x2 days.  EXAM: CT ABDOMEN AND  PELVIS WITH CONTRAST  TECHNIQUE: Multidetector CT imaging of the abdomen and pelvis was performed using the standard protocol following bolus administration of intravenous contrast.  CONTRAST:  189mL OMNIPAQUE IOHEXOL 300 MG/ML  SOLN  COMPARISON:  Abdomen series 6 09/2010.  CT abdomen 05/21/ 2012.  FINDINGS: Stable simple cyst right lobe of liver. Small granulomas in the liver. Liver otherwise unremarkable. Previously identified deformity of the spleen with possible multiple splenic infarcts less marked on today's examination. 1 cm lucency noted in the midportion of the spleen is noted. This could represent a focal splenic infarct. Small vascular lesion could also present in this fashion. Splenic tumor would statistically be less likely. Pancreas is unremarkable. No biliary distention. The gallbladder is nondistended.  Adrenals normal. Kidneys are unremarkable. No hydronephrosis. No obstructing ureteral stone. The bladder is nondistended. Hysterectomy. No pelvic mass. No free pelvic fluid collection.  No adenopathy. Abdominal aorta is widely patent. Visceral vessels are patent. Portal vein patent.  Appendectomy. Severe sigmoid colonic diverticulosis. Mild thickening of the sigmoid colon noted. Mild colitis cannot be excluded. Previously identified severe changes of colitis have resolved. No bowel distention. Hiatal hernia again noted. No free air.  Bibasilar pleural parenchymal scarring. Mild cardiomegaly. Coronary artery disease. Bilateral breast implants. Degenerative changes and postsurgical changes thoracolumbar spine. No acute bony abnormality.  IMPRESSION: 1. No acute abnormality. 2. Previously identified splenic deformities and infarcts less marked on today's exam. Small 1 cm rounded density noted may represent a small splenic infarct or vascular lesion. Splenic tumor less likely statistically. 3. Mild thickening noted of the sigmoid colon. This may be related to the extensive diverticulosis present. Mild  colitis cannot be excluded. Previously identified changes of severe colitis and resolved. 4. Coronary artery disease.   Electronically Signed   By: Marcello Moores  Register   On: 01/24/2014 21:26    Scheduled Meds: . aspirin  81 mg Oral Daily  . [START ON 01/27/2014] Influenza vac split quadrivalent PF  0.5 mL Intramuscular Tomorrow-1000  . metoprolol  50 mg Oral BID  . metroNIDAZOLE  500 mg Oral 3 times per day  . OxyCODONE  40 mg Oral Q8H  . sodium chloride  3 mL Intravenous Q12H   Continuous Infusions: . sodium chloride 75 mL/hr at 01/26/14 1310    Active Problems:   Colitis    Time spent: 30 min    Freddie Nghiem, [facility] Pager [phone]. If 7PM-7AM, please contact night-coverage at [email or web address], password [facility] 01/26/2014, 3:17 PM  LOS: 2 days

## 2014-01-27 DIAGNOSIS — R000 Tachycardia, unspecified: Secondary | ICD-10-CM

## 2014-01-27 DIAGNOSIS — K529 Noninfective gastroenteritis and colitis, unspecified: Secondary | ICD-10-CM | POA: Diagnosis not present

## 2014-01-27 DIAGNOSIS — I10 Essential (primary) hypertension: Secondary | ICD-10-CM

## 2014-01-27 DIAGNOSIS — D72829 Elevated white blood cell count, unspecified: Secondary | ICD-10-CM

## 2014-01-27 MED ORDER — METRONIDAZOLE 500 MG PO TABS: 500.0000 mg | ORAL_TABLET | Freq: Three times a day (TID) | ORAL | Status: DC

## 2014-01-27 MED ORDER — ONDANSETRON HCL 4 MG PO TABS: 4.0000 mg | ORAL_TABLET | ORAL | Status: DC | PRN

## 2014-01-27 MED ORDER — OXYCODONE HCL 15 MG PO TABS: 15.0000 mg | ORAL_TABLET | Freq: Four times a day (QID) | ORAL | Status: DC | PRN

## 2014-01-27 NOTE — Progress Notes (Signed)
Discharge to home, no complaints of any pain or discomfort. D/c instructions and follow up appointments done and was given to the patient. PIV removed by NT no s/s of infiltration or swelling noted.

## 2014-01-27 NOTE — Discharge Summary (Addendum)
Physician Discharge Summary  [name] MHD:[id number] DOB: 11-20-1932 DOA: 01/24/2014  PCP: [name], MD  Admit date: 01/24/2014 Discharge date: 01/27/2014  Recommendations for Outpatient Follow-up:  1. To home with her caregiver.   2. F/u with gastroenterologist within 2-4 weeks  3. F/u with PCP in 2 weeks 4. Continue flagyl x 2 weeks  Discharge Diagnoses:  Principal Problem:   Colitis Active Problems:   Iron deficiency anemia   Leukocytosis   Sinus tachycardia   Essential hypertension   Discharge Condition: stable, improved  Diet recommendation: regular  Wt Readings from Last 3 Encounters:  01/25/14 63.5 kg (139 lb 15.9 oz)  10/23/12 62.143 kg (137 lb)  10/23/12 62.143 kg (137 lb)    History of present illness:   Pt is 80 yo female with HTN, history of C. Diff, diverticulosis, presented to [facility] ED with main concern of several days duration of progressively worsening epigastric pain, throbbing and intermittent, 5/10 in severity, non radiating, associated with dark stools, watery diarrhea, nausea, poor oral intake. Pt reported similar events when she was diagnosed with C. Diff colitis. She denied fevers, chills, chest pain or shortness of breath. No sick contacts or exposures.  In ED, pt hemodynamically stable, VSS except mild tachycardia with HR in 120's. CT abd with ? Colitis, diverticulosis. C. Diff requested, FOBT +. [facility] asked to admit to telemetry bed for further evaluation.    Hospital Course:   Colitis with abd pain and N/V, improved.  She may have had C. Diff colitis which she has had before or ischemic colitis.  Inflammatory bowel is also possible but less likely.  C. Diff not obtained because no BMs during admission.  She was started on IV flagyl initially when she was unable to tolerate PO, but was quickly transitioned to oral flagyl.  Her diet was advanced to regular foods within 24 hours.  She was given IVF, antiemetics, and pain medication.  She should  continue a 14-day course of flagyl for presumptive C. Diff and advised to stop her PPI.  F/u with GI in a few weeks.    Occult positive stools, possibly secondary to diverticulosis, colitis as above.  Her hemoglobin remained stable.  This may have been secondary to infectious colitis, however, recommend that she follow up with gastroenterology for additional testing.    Leukocytosis, resolved with abx and hydration  Sinus tachycardia resolved with hydration  HTN, stable, continue Metoprolol  Iron deficiency anemia likely due to dilution and chronic disease. Hx of iron deficiency   Consultants:  none Procedures:  CT: 1 cm splenic density likely infarct or vascular lesion, mild sigmoid thickening with possible mild colitis, CAD Antibiotics:  flagyl 10/9 >>  Discharge Exam: Filed Vitals:   01/27/14 0529  BP: 137/49  Pulse: 82  Temp: 97.8 F (36.6 C)  Resp: 18   Filed Vitals:   01/26/14 1259 01/26/14 2352 01/26/14 2359 01/27/14 0529  BP: 148/62 96/46 120/60 137/49  Pulse: 83 76  82  Temp: 98.2 F (36.8 C) 98.1 F (36.7 C)  97.8 F (36.6 C)  TempSrc: Oral Oral  Oral  Resp: 18 20  18   Height:      Weight:      SpO2: 97% 99%  98%    General: WF, No acute distress, had one episode of vomiting, but overall feeling better and stool formed  HEENT: NCAT, MMM  Cardiovascular: RRR, nl S1, S2 no mrg, 2+ pulses, warm extremities  Respiratory: CTAB, no increased WOB  Abdomen:  Hyperactive BS, soft, ND, mild TTP diffusely without rebound or guarding, markedly improved since admission MSK: Normal tone and bulk, no LEE  Neuro: Grossly intact   Discharge Instructions      Discharge Instructions   Call MD for:  difficulty breathing, headache or visual disturbances    Complete by:  As directed      Call MD for:  extreme fatigue    Complete by:  As directed      Call MD for:  hives    Complete by:  As directed      Call MD for:  persistant dizziness or light-headedness     Complete by:  As directed      Call MD for:  persistant nausea and vomiting    Complete by:  As directed      Call MD for:  severe uncontrolled pain    Complete by:  As directed      Call MD for:  temperature >100.4    Complete by:  As directed      Diet general    Complete by:  As directed      Discharge instructions    Complete by:  As directed   You were hospitalized with colitis, possibly c. Diff colitis.  We were unable to collect a stool sample to test for C. Diff, however, you improved quickly after starting treatment for C. Diff.  Please take flagyl three times a day for the next two weeks and follow up with your primary care doctor.  Stop your protonix because this medication can worsen C. Diff.  You tested positive for blood in your stools.  Please talk to your primary care doctor about seeing a gastroenterologist about this.     Increase activity slowly    Complete by:  As directed             Medication List    STOP taking these medications       pantoprazole 40 MG tablet  Commonly known as:  PROTONIX      TAKE these medications       aspirin 81 MG tablet  Take 81 mg by mouth daily.     ENSURE PLUS Liqd  Take 237 mLs by mouth 2 (two) times daily.     metoprolol 50 MG tablet  Commonly known as:  LOPRESSOR  Take 50 mg by mouth 2 (two) times daily.     metroNIDAZOLE 500 MG tablet  Commonly known as:  FLAGYL  Take 1 tablet (500 mg total) by mouth every 8 (eight) hours.     ondansetron 4 MG tablet  Commonly known as:  ZOFRAN  Take 1 tablet (4 mg total) by mouth every 4 (four) hours as needed for nausea or vomiting.     oxyCODONE 40 MG 12 hr tablet  Commonly known as:  OXYCONTIN  Take 40 mg by mouth every 8 (eight) hours.     oxyCODONE 15 MG immediate release tablet  Commonly known as:  ROXICODONE  Take 1 tablet (15 mg total) by mouth every 6 (six) hours as needed for moderate pain or severe pain.       Follow-up Information   Follow up with  [facility], MD. Schedule an appointment as soon as possible for a visit in 2 weeks.   Specialty:  Family Medicine   Contact information:   [address] [place] [address] [phone]       Follow up with Lear Ng., MD.  Schedule an appointment as soon as possible for a visit in 2 weeks.   Specialty:  Gastroenterology   Contact information:   [address], [place] [address] [phone]        The results of significant diagnostics from this hospitalization (including imaging, microbiology, ancillary and laboratory) are listed below for reference.    Significant Diagnostic Studies: Dg Chest 2 View  01/24/2014   CLINICAL DATA:  Nausea, chest pain  EXAM: CHEST  2 VIEW  COMPARISON:  09/24/2010  FINDINGS: Cardiomediastinal silhouette is stable. There is elevation of the right hemidiaphragm. No acute infiltrate or pulmonary edema. Moderate size hiatal hernia.  IMPRESSION: No active cardiopulmonary disease.  Moderate size hiatal hernia.   Electronically Signed   By: Lahoma Crocker M.D.   On: 01/24/2014 20:11   Ct Abdomen Pelvis W Contrast  01/24/2014   CLINICAL DATA:  Abdominal pain. Nausea, vomiting, and diarrhea x2 days.  EXAM: CT ABDOMEN AND PELVIS WITH CONTRAST  TECHNIQUE: Multidetector CT imaging of the abdomen and pelvis was performed using the standard protocol following bolus administration of intravenous contrast.  CONTRAST:  184mL OMNIPAQUE IOHEXOL 300 MG/ML  SOLN  COMPARISON:  Abdomen series 6 09/2010.  CT abdomen 05/21/ 2012.  FINDINGS: Stable simple cyst right lobe of liver. Small granulomas in the liver. Liver otherwise unremarkable. Previously identified deformity of the spleen with possible multiple splenic infarcts less marked on today's examination. 1 cm lucency noted in the midportion of the spleen is noted. This could represent a focal splenic infarct. Small vascular lesion could also present in this fashion. Splenic tumor would  statistically be less likely. Pancreas is unremarkable. No biliary distention. The gallbladder is nondistended.  Adrenals normal. Kidneys are unremarkable. No hydronephrosis. No obstructing ureteral stone. The bladder is nondistended. Hysterectomy. No pelvic mass. No free pelvic fluid collection.  No adenopathy. Abdominal aorta is widely patent. Visceral vessels are patent. Portal vein patent.  Appendectomy. Severe sigmoid colonic diverticulosis. Mild thickening of the sigmoid colon noted. Mild colitis cannot be excluded. Previously identified severe changes of colitis have resolved. No bowel distention. Hiatal hernia again noted. No free air.  Bibasilar pleural parenchymal scarring. Mild cardiomegaly. Coronary artery disease. Bilateral breast implants. Degenerative changes and postsurgical changes thoracolumbar spine. No acute bony abnormality.  IMPRESSION: 1. No acute abnormality. 2. Previously identified splenic deformities and infarcts less marked on today's exam. Small 1 cm rounded density noted may represent a small splenic infarct or vascular lesion. Splenic tumor less likely statistically. 3. Mild thickening noted of the sigmoid colon. This may be related to the extensive diverticulosis present. Mild colitis cannot be excluded. Previously identified changes of severe colitis and resolved. 4. Coronary artery disease.   Electronically Signed   By: Marcello Moores  Register   On: 01/24/2014 21:26    Microbiology: Recent Results (from the past 240 hour(s))  URINE CULTURE     Status: None   Collection Time    01/25/14  4:20 AM      Result Value Ref Range Status   Specimen Description URINE, CLEAN CATCH   Final   Special Requests NONE   Final   Culture  Setup Time     Final   Value: 01/25/2014 11:08     Performed at [facility]     Final   Value: 70,000 COLONIES/ML     Performed at [facility]   Culture     Final   Value: Multiple bacterial  morphotypes present, none  predominant. Suggest appropriate recollection if clinically indicated.     Performed at [facility]   Report Status 01/26/2014 FINAL   Final     Labs: Basic Metabolic Panel:  Recent Labs Lab 01/24/14 1842 01/25/14 0542 01/26/14 0500  NA 135* 138 138  K 4.9 4.7 4.2  CL 98 103 102  CO2 24 25 26   GLUCOSE 121* 111* 113*  BUN 19 15 12   CREATININE 0.86 0.79 0.78  CALCIUM 9.4 8.8 8.9   Liver Function Tests:  Recent Labs Lab 01/24/14 1842  AST 17  ALT 10  ALKPHOS 65  BILITOT <0.2*  PROT 7.6  ALBUMIN 3.6    Recent Labs Lab 01/24/14 1842  LIPASE 24   No results found for this basename: AMMONIA,  in the last 168 hours CBC:  Recent Labs Lab 01/24/14 1842 01/25/14 0542 01/26/14 0500  WBC 11.1* 9.3 8.5  NEUTROABS 8.3*  --   --   HGB 12.6 10.9* 11.3*  HCT 39.9 33.4* 36.4  MCV 93.9 94.4 94.3  PLT 486* 422* 470*   Cardiac Enzymes: No results found for this basename: CKTOTAL, CKMB, CKMBINDEX, TROPONINI,  in the last 168 hours BNP: BNP (last 3 results) No results found for this basename: PROBNP,  in the last 8760 hours CBG: No results found for this basename: GLUCAP,  in the last 168 hours  Time coordinating discharge: 45 minutes  Signed:  Belmira Daley  [facility] 01/27/2014, 10:37 AM

## 2014-05-19 DEATH — deceased

## 2015-04-10 ENCOUNTER — Emergency Department (HOSPITAL_COMMUNITY)
Admission: EM | Admit: 2015-04-10 | Discharge: 2015-04-10 | Disposition: A | Discharge status: Home / Self Care | Payer: Medicare Other | Attending: Emergency Medicine | Admitting: Emergency Medicine

## 2015-04-10 ENCOUNTER — Encounter (HOSPITAL_COMMUNITY): Payer: Self-pay

## 2015-04-10 ENCOUNTER — Emergency Department (HOSPITAL_COMMUNITY): Payer: Medicare Other

## 2015-04-10 DIAGNOSIS — K219 Gastro-esophageal reflux disease without esophagitis: Secondary | ICD-10-CM | POA: Insufficient documentation

## 2015-04-10 DIAGNOSIS — T402X5A Adverse effect of other opioids, initial encounter: Secondary | ICD-10-CM | POA: Diagnosis not present

## 2015-04-10 DIAGNOSIS — F419 Anxiety disorder, unspecified: Secondary | ICD-10-CM | POA: Insufficient documentation

## 2015-04-10 DIAGNOSIS — R05 Cough: Secondary | ICD-10-CM | POA: Insufficient documentation

## 2015-04-10 DIAGNOSIS — Z792 Long term (current) use of antibiotics: Secondary | ICD-10-CM | POA: Diagnosis not present

## 2015-04-10 DIAGNOSIS — T483X5A Adverse effect of antitussives, initial encounter: Secondary | ICD-10-CM | POA: Diagnosis not present

## 2015-04-10 DIAGNOSIS — I499 Cardiac arrhythmia, unspecified: Secondary | ICD-10-CM | POA: Insufficient documentation

## 2015-04-10 DIAGNOSIS — Z7982 Long term (current) use of aspirin: Secondary | ICD-10-CM | POA: Insufficient documentation

## 2015-04-10 DIAGNOSIS — Z862 Personal history of diseases of the blood and blood-forming organs and certain disorders involving the immune mechanism: Secondary | ICD-10-CM | POA: Insufficient documentation

## 2015-04-10 DIAGNOSIS — R451 Restlessness and agitation: Secondary | ICD-10-CM | POA: Diagnosis not present

## 2015-04-10 DIAGNOSIS — G8929 Other chronic pain: Secondary | ICD-10-CM | POA: Diagnosis not present

## 2015-04-10 DIAGNOSIS — M545 Low back pain: Secondary | ICD-10-CM | POA: Diagnosis not present

## 2015-04-10 DIAGNOSIS — Z79899 Other long term (current) drug therapy: Secondary | ICD-10-CM | POA: Insufficient documentation

## 2015-04-10 DIAGNOSIS — Z86718 Personal history of other venous thrombosis and embolism: Secondary | ICD-10-CM | POA: Insufficient documentation

## 2015-04-10 DIAGNOSIS — R42 Dizziness and giddiness: Secondary | ICD-10-CM | POA: Diagnosis not present

## 2015-04-10 DIAGNOSIS — M1990 Unspecified osteoarthritis, unspecified site: Secondary | ICD-10-CM | POA: Insufficient documentation

## 2015-04-10 DIAGNOSIS — Z789 Other specified health status: Secondary | ICD-10-CM

## 2015-04-10 LAB — I-STAT TROPONIN, ED: Troponin i, poc: 0 ng/mL (ref 0.00–0.08)

## 2015-04-10 LAB — URINALYSIS, ROUTINE W REFLEX MICROSCOPIC
Bilirubin Urine: NEGATIVE
Glucose, UA: NEGATIVE mg/dL
Ketones, ur: NEGATIVE mg/dL
LEUKOCYTES UA: NEGATIVE
NITRITE: NEGATIVE
Protein, ur: NEGATIVE mg/dL
SPECIFIC GRAVITY, URINE: 1.01 (ref 1.005–1.030)
pH: 7 (ref 5.0–8.0)

## 2015-04-10 LAB — CBC WITH DIFFERENTIAL/PLATELET
BASOS PCT: 1 %
Basophils Absolute: 0.1 10*3/uL (ref 0.0–0.1)
EOS PCT: 0 %
Eosinophils Absolute: 0 10*3/uL (ref 0.0–0.7)
HCT: 35.3 % — ABNORMAL LOW (ref 36.0–46.0)
HEMOGLOBIN: 11 g/dL — AB (ref 12.0–15.0)
LYMPHS ABS: 1.3 10*3/uL (ref 0.7–4.0)
Lymphocytes Relative: 10 %
MCH: 26.3 pg (ref 26.0–34.0)
MCHC: 31.2 g/dL (ref 30.0–36.0)
MCV: 84.2 fL (ref 78.0–100.0)
MONOS PCT: 4 %
Monocytes Absolute: 0.5 10*3/uL (ref 0.1–1.0)
Neutro Abs: 11.1 10*3/uL — ABNORMAL HIGH (ref 1.7–7.7)
Neutrophils Relative %: 85 %
PLATELETS: 434 10*3/uL — AB (ref 150–400)
RBC: 4.19 MIL/uL (ref 3.87–5.11)
RDW: 16.3 % — ABNORMAL HIGH (ref 11.5–15.5)
WBC: 13 10*3/uL — AB (ref 4.0–10.5)

## 2015-04-10 LAB — COMPREHENSIVE METABOLIC PANEL
ALBUMIN: 3.8 g/dL (ref 3.5–5.0)
ALK PHOS: 59 U/L (ref 38–126)
ALT: 11 U/L — ABNORMAL LOW (ref 14–54)
AST: 22 U/L (ref 15–41)
Anion gap: 9 (ref 5–15)
BUN: 16 mg/dL (ref 6–20)
CO2: 24 mmol/L (ref 22–32)
Calcium: 9.1 mg/dL (ref 8.9–10.3)
Chloride: 100 mmol/L — ABNORMAL LOW (ref 101–111)
Creatinine, Ser: 0.82 mg/dL (ref 0.44–1.00)
GFR calc Af Amer: >60 mL/min (ref >60)
GFR calc non Af Amer: >60 mL/min (ref >60)
GLUCOSE: 140 mg/dL — AB (ref 65–99)
Potassium: 4.6 mmol/L (ref 3.5–5.1)
SODIUM: 133 mmol/L — AB (ref 135–145)
Total Bilirubin: 0.5 mg/dL (ref 0.3–1.2)
Total Protein: 7.6 g/dL (ref 6.5–8.1)

## 2015-04-10 LAB — URINE MICROSCOPIC-ADD ON: Bacteria, UA: NONE SEEN

## 2015-04-10 MED ORDER — BENZONATATE 100 MG PO CAPS: 100.0000 mg | ORAL_CAPSULE | Freq: Three times a day (TID) | ORAL | Status: DC | PRN

## 2015-04-10 NOTE — ED Notes (Signed)
Patient transported to X-ray 

## 2015-04-10 NOTE — ED Provider Notes (Signed)
CSN: [id number]     Arrival date & time 04/10/15  1017 History   First MD Initiated Contact with Patient 04/10/15 1023     Chief Complaint  Patient presents with  . Weakness  . Nausea  . Dizziness     (Consider location/radiation/quality/duration/timing/severity/associated sxs/prior Treatment) HPI Comments: 81 year old female with history of with history chronic pain treated with oxycodone, anemia presents for feeling strange. The patient reports that she has had a cough and that this morning she took Delsym for her. She reports shortly after taking able For Delsym she took her oxycodone. Since that time she has felt very strange. She says she just feels confused and not herself and is not sure what's going on. She says that she has had weakness all over her body, nausea, dizzy and lightheadedness. She denies chest pain or shortness of breath. No fever or chills. No vomiting or diarrhea.   Past Medical History  Diagnosis Date  . Arthritis 10-05-12    degenerative joint disease,scoliosis spine  . Dysrhythmia 10-05-12    tachycardia -tx. Lopressor  . GERD (gastroesophageal reflux disease)   . H/O hiatal hernia   . Anemia 10-05-12    hgb-9.0 on 09-14-12  . Arm vein blood clot 10-05-12    Rt. arm '12  . Transfusion history 10-05-12    4 units blood '12  . H/O esophagitis   . Tachycardia    Past Surgical History  Procedure Laterality Date  . Abdominal hysterectomy    . Appendectomy      child  . Cataract extraction, bilateral    . Back surgery  10-05-12    x5-last fusion with plates  . Breast enhancement surgery    . Colonoscopy with propofol N/A 10/23/2012    Procedure: COLONOSCOPY WITH PROPOFOL;  Surgeon: Lear Ng, MD;  Location: [facility];  Service: Endoscopy;  Laterality: N/A;  . Hot hemostasis N/A 10/23/2012    Procedure: HOT HEMOSTASIS (ARGON PLASMA COAGULATION/BICAP);  Surgeon: Lear Ng, MD;  Location: [facility];  Service: Endoscopy;  Laterality: N/A;    No family history on file. Social History  Substance Use Topics  . Smoking status: Never Smoker   . Smokeless tobacco: None  . Alcohol Use: No   OB History    No data available     Review of Systems  Constitutional: Negative for fever, chills and fatigue.  HENT: Negative for congestion, postnasal drip and rhinorrhea.   Respiratory: Positive for cough. Negative for chest tightness and shortness of breath.   Cardiovascular: Negative for chest pain and palpitations.  Gastrointestinal: Positive for nausea. Negative for vomiting, abdominal pain, diarrhea and constipation.  Genitourinary: Negative for urgency, frequency and dyspareunia.  Musculoskeletal: Positive for back pain (chronic and unchanged). Negative for myalgias.  Skin: Negative for rash.  Neurological: Positive for dizziness, weakness (generalized) and light-headedness. Negative for headaches.  Hematological: Does not bruise/bleed easily.  Psychiatric/Behavioral: Positive for confusion.      Allergies  Ciprofloxacin  Home Medications   Prior to Admission medications   Medication Sig Start Date End Date Taking? Authorizing Provider  aspirin 81 MG tablet Take 81 mg by mouth daily.   Yes Historical Provider, MD  ENSURE PLUS (ENSURE PLUS) LIQD Take 237 mLs by mouth 2 (two) times daily.   Yes Historical Provider, MD  metoprolol (LOPRESSOR) 50 MG tablet Take 50 mg by mouth 2 (two) times daily.   Yes Historical Provider, MD  Multiple Vitamin (MULTIVITAMIN WITH MINERALS) TABS tablet Take 1 tablet  by mouth daily.   Yes Historical Provider, MD  ondansetron (ZOFRAN-ODT) 4 MG disintegrating tablet Take 4 mg by mouth every 8 (eight) hours as needed for nausea or vomiting.   Yes Historical Provider, MD  oxyCODONE (OXYCONTIN) 40 MG 12 hr tablet Take 40 mg by mouth every 8 (eight) hours.   Yes Historical Provider, MD  oxyCODONE (ROXICODONE) 15 MG immediate release tablet Take 1 tablet (15 mg total) by mouth every 6 (six) hours as  needed for moderate pain or severe pain. 01/27/14  Yes Janece Canterbury, MD  pantoprazole (PROTONIX) 40 MG tablet Take 40 mg by mouth 2 (two) times daily.   Yes Historical Provider, MD  benzonatate (TESSALON) 100 MG capsule Take 1 capsule (100 mg total) by mouth 3 (three) times daily as needed for cough. 04/10/15   Harvel Quale, MD  metroNIDAZOLE (FLAGYL) 500 MG tablet Take 1 tablet (500 mg total) by mouth every 8 (eight) hours. 01/27/14   Janece Canterbury, MD  ondansetron (ZOFRAN) 4 MG tablet Take 1 tablet (4 mg total) by mouth every 4 (four) hours as needed for nausea or vomiting. 01/27/14   Janece Canterbury, MD   BP 158/86 mmHg  Pulse 92  Temp(Src) 97.6 F (36.4 C) (Oral)  Resp 16  Ht 5\' 3"  (1.6 m)  Wt 140 lb (63.504 kg)  BMI 24.81 kg/m2  SpO2 97% Physical Exam  Constitutional: She is oriented to person, place, and time.  Patient says she feels so weird she can't stand it  HENT:  Head: Normocephalic and atraumatic.  Right Ear: External ear normal.  Left Ear: External ear normal.  Eyes: EOM are normal. Pupils are equal, round, and reactive to light.  Neck: Normal range of motion. Neck supple.  Cardiovascular: Normal rate, regular rhythm, normal heart sounds and intact distal pulses.   No murmur heard. Pulmonary/Chest: Effort normal. No respiratory distress. She has no wheezes. She has no rales.  Abdominal: Soft. She exhibits no distension. There is no tenderness.  Neurological: She is alert and oriented to person, place, and time. No cranial nerve deficit. She exhibits normal muscle tone.  Moving all extremities.  No focal weakness.  Able to lift legs off of bed.  Grasps hands equally bilaterally.  Transferred from chair to bed without difficulty.  Skin: She is not diaphoretic.  Psychiatric: Her mood appears anxious. She is agitated. She is not slowed, not withdrawn and not actively hallucinating.  Vitals reviewed.   ED Course  Procedures (including critical care time) Labs  Review Labs Reviewed  CBC WITH DIFFERENTIAL/PLATELET - Abnormal; Notable for the following:    WBC 13.0 (*)    Hemoglobin 11.0 (*)    HCT 35.3 (*)    RDW 16.3 (*)    Platelets 434 (*)    Neutro Abs 11.1 (*)    All other components within normal limits  COMPREHENSIVE METABOLIC PANEL - Abnormal; Notable for the following:    Sodium 133 (*)    Chloride 100 (*)    Glucose, Bld 140 (*)    ALT 11 (*)    All other components within normal limits  URINALYSIS, ROUTINE W REFLEX MICROSCOPIC (NOT AT [facility]) - Abnormal; Notable for the following:    Hgb urine dipstick TRACE (*)    All other components within normal limits  URINE MICROSCOPIC-ADD ON - Abnormal; Notable for the following:    Squamous Epithelial / LPF 0-5 (*)    All other components within normal limits  Randolm Idol, ED  Imaging Review Dg Chest 2 View  04/10/2015  CLINICAL DATA:  Cough and feeling sick for the last 2 to 3 weeks. EXAM: CHEST  2 VIEW COMPARISON:  01/24/2014 FINDINGS: Cardiomediastinal silhouette is normal. Mediastinal contours appear intact. Lobulated hyperdense mass overlying the cardiac silhouette likely represents a hiatal hernia. There is no evidence of focal airspace consolidation, pleural effusion or pneumothorax. There is low lung volumes with chronic elevation of the right hemidiaphragm. Linear atelectasis is seen in the right mid lung. Osseous structures are without acute abnormality. Partially visualized spinal fusion is noted. Soft tissues are grossly normal. IMPRESSION: Low lung volumes with right lung atelectasis. Electronically Signed   By: Fidela Salisbury M.D.   On: 04/10/2015 12:07   I have personally reviewed and evaluated these images and lab results as part of my medical decision-making.   EKG Interpretation   Date/Time:  Friday April 10 2015 10:43:21 EST Ventricular Rate:  77 PR Interval:  198 QRS Duration: 83 QT Interval:  402 QTC Calculation: 455 R Axis:   -16 Text  Interpretation:  Sinus rhythm Borderline left axis deviation Anterior  infarct, old No significant change since last tracing Confirmed by NGUYEN,  EMILY ([id number]) on 04/10/2015 11:11:39 AM      MDM  Patient was seen and evaluated in stable condition.  Likely reaction to Delsym and Oxycodone combination.  Patient's mental status and symptoms improved in the ED without intervention.  She was able to tolerate PO.  Patient was requesting her afternoon dose of oxycodone but was told that she would need to wait secondary to the reaction she had today.  Patient was requesting discharge and her day nurse will be home with her. Patient was discharged home in stable condition. Final diagnoses:  Drug interaction    1. Drug interaction    Harvel Quale, MD 04/10/15 1600

## 2015-04-10 NOTE — ED Notes (Signed)
MD at bedside. 

## 2015-04-10 NOTE — ED Notes (Signed)
Patient is aware we need urine 

## 2015-04-10 NOTE — ED Notes (Signed)
Per [facility]- pt resides at home. Pt reports sudden of generalized weakness,nausea, and dizziness after taken RX oxycontin for back pain. RX she has taken before. Pt appears anxious. EKG unremarkable. Denies CP/SOB

## 2015-04-10 NOTE — ED Notes (Signed)
Bed: WA01 Expected date:  Expected time:  Means of arrival:  Comments: EMS 81 yo weakness/nausea

## 2015-04-10 NOTE — ED Notes (Signed)
EKG PERFORMED AT 10:43

## 2015-04-10 NOTE — Discharge Instructions (Signed)
The symptoms you experienced today were likely secondary to using the cough medicine and then taking your pain medicine.  Do NOT take the Delsym.  Try to cough medicine prescribed today.  Return with worsening symptoms or new concerning symptoms.  Drug Toxicity Drug toxicity refers to harmful and unwanted (adverse) effects of a drug in your body. Drug toxicity often results from taking too much of a drug (overdose) by accident or on purpose. With some drugs, there is only a small difference between the dose that is needed to treat your condition and a dose that is harmful (narrow therapeutic range). However, any drug can be toxic at high doses, and even normal doses of certain drugs can be toxic for some people. These include over-the-counter (OTC) medicines. Drug toxicity can happen suddenly when you first start taking a drug or when you suddenly take too much of a drug (acute toxicity). It can also happen as a result of taking a drug for a long period of time (chronic toxicity). The effects of drug toxicity can be mild, dangerous, or even deadly. CAUSES Many things can cause drug toxicity. Common causes of acute toxicity include a drug overdose or an allergic reaction to a drug. Most drugs are broken down (metabolized) by your liver and eliminated (excreted) by your kidneys. Chronic drug toxicity can result from changes in the way that your body metabolizes a drug. This can happen, for example, if you weigh less than you did when you started taking a drug but you keep taking the same dose that you took at the heavier weight. RISK FACTORS You may have a higher risk for drug toxicity if you:  Are under 80 years of age or over 63 years of age.  Have liver disease, kidney disease, or another medical condition.  Are taking more than one drug.  Are pregnant.  Are allergic to certain drugs.  Have genes that cause you to be more affected by (susceptible to) certain drugs.  Take a drug that has a  narrow therapeutic range. Certain types of drugs are more likely than others to cause toxicity. Many drugs have a narrow therapeutic range, including:  Blood thinners.  Heart medicines.  Diabetes medicines.  Medicines to prevent or stop seizures.  Theophylline for asthma.  Lithium for bipolar disorder. SYMPTOMS Signs and symptoms of drug toxicity depend on the drug and the amount that was taken. They may start suddenly or develop gradually over time. DIAGNOSIS Drug toxicity may be diagnosed based on your symptoms. Some drugs have known side effects that suggest toxicity. It is important that you tell health care provider about all of the drugs that you are taking and whether you have ever had a reaction to a drug. Your health care provider will do a physical exam. You may have tests to check for drug toxicity, including:  Blood tests to measure the amount of the drug in your blood or to check for signs of kidney or liver damage.  Urine tests.  Other tests to check for organ damage. TREATMENT Treatment may include:  Stopping the drug.  Lowering the dose of the drug.  Switching to a different drug. You may also need treatment to stop or reverse the effects of the toxicity. These treatments depend on the drug that caused the toxicity, how severe the toxicity is, and which parts of your body are affected. HOME CARE INSTRUCTIONS  Take medicines only as directed by your health care provider. Always ask your health care provider  to discuss the possible side effects of any new drug that you start taking.  Keep a list of all of the drugs that you take, including over-the-counter medicines. Bring this list with you to all of your medical visits.  Read the drug inserts that come with your medicines.  Keep all follow-up visits as directed by your health care provider. This is important. SEEK MEDICAL CARE IF:  Your symptoms return.  You develop any new signs or symptoms when you are  taking medicines.  You notice any signs that indicate that you are taking too much of your medicine, based on what your health care provider told you to watch for. SEEK IMMEDIATE MEDICAL CARE IF:  You have chest pain.  You have difficulty breathing.  You have a loss of consciousness.   This information is not intended to replace advice given to you by your health care provider. Make sure you discuss any questions you have with your health care provider.   Document Released: 04/04/2005 Document Revised: 08/19/2014 Document Reviewed: 04/09/2014 Elsevier Interactive Patient Education [employer].

## 2015-04-10 NOTE — ED Notes (Signed)
WATER GIVEN 

## 2015-06-19 ENCOUNTER — Emergency Department (HOSPITAL_COMMUNITY)
Admission: EM | Admit: 2015-06-19 | Discharge: 2015-06-19 | Disposition: A | Discharge status: Home / Self Care | Payer: Medicare Other | Attending: Emergency Medicine | Admitting: Emergency Medicine

## 2015-06-19 ENCOUNTER — Encounter (HOSPITAL_COMMUNITY): Payer: Self-pay

## 2015-06-19 DIAGNOSIS — Z9889 Other specified postprocedural states: Secondary | ICD-10-CM | POA: Diagnosis not present

## 2015-06-19 DIAGNOSIS — K219 Gastro-esophageal reflux disease without esophagitis: Secondary | ICD-10-CM | POA: Diagnosis not present

## 2015-06-19 DIAGNOSIS — Z862 Personal history of diseases of the blood and blood-forming organs and certain disorders involving the immune mechanism: Secondary | ICD-10-CM | POA: Insufficient documentation

## 2015-06-19 DIAGNOSIS — G8929 Other chronic pain: Secondary | ICD-10-CM | POA: Insufficient documentation

## 2015-06-19 DIAGNOSIS — Z8679 Personal history of other diseases of the circulatory system: Secondary | ICD-10-CM | POA: Insufficient documentation

## 2015-06-19 DIAGNOSIS — Z7982 Long term (current) use of aspirin: Secondary | ICD-10-CM | POA: Insufficient documentation

## 2015-06-19 DIAGNOSIS — M1990 Unspecified osteoarthritis, unspecified site: Secondary | ICD-10-CM | POA: Diagnosis not present

## 2015-06-19 DIAGNOSIS — M549 Dorsalgia, unspecified: Secondary | ICD-10-CM | POA: Insufficient documentation

## 2015-06-19 DIAGNOSIS — R109 Unspecified abdominal pain: Secondary | ICD-10-CM | POA: Insufficient documentation

## 2015-06-19 DIAGNOSIS — R112 Nausea with vomiting, unspecified: Secondary | ICD-10-CM | POA: Diagnosis not present

## 2015-06-19 DIAGNOSIS — Z792 Long term (current) use of antibiotics: Secondary | ICD-10-CM | POA: Insufficient documentation

## 2015-06-19 DIAGNOSIS — Z9049 Acquired absence of other specified parts of digestive tract: Secondary | ICD-10-CM | POA: Insufficient documentation

## 2015-06-19 DIAGNOSIS — Z90710 Acquired absence of both cervix and uterus: Secondary | ICD-10-CM | POA: Insufficient documentation

## 2015-06-19 MED ORDER — MORPHINE SULFATE (PF) 4 MG/ML IV SOLN: 6.0000 mg | Freq: Once | INTRAVENOUS | Status: DC

## 2015-06-19 NOTE — ED Notes (Signed)
Bed: WA17 Expected date:  Expected time:  Means of arrival:  Comments: 82 yo F  abd pain and back pain

## 2015-06-19 NOTE — ED Notes (Signed)
Patient c/o abdominal pain and back pain.  Per EMS patient has chronic back pain and PCP changed her prescriptions from OxyContin and percocet to Opana last Tuesday.  Per EMS patient states that the new medication is not helping.  Per EMS patient states is constipated.  Last BM 06/18/15 (small).  Per EMS patient also has anxiety.

## 2015-06-19 NOTE — ED Notes (Signed)
[facility] called  

## 2015-06-19 NOTE — ED Provider Notes (Signed)
CSN: [id number]     Arrival date & time 06/19/15  [id number] History   First MD Initiated Contact with Patient 06/19/15 [phone]     Chief Complaint  Patient presents with  . Abdominal Pain  . Back Pain     (Consider location/radiation/quality/duration/timing/severity/associated sxs/prior Treatment) HPI Patient presents to the emergency department withBack pain which is chronic and she states that last night she had 4 crackers with documented cheese and shortly thereafter developed mid abdominal discomfort with nausea.  She states that she vomited once.  The patient denies chest pain, shortness of breath, weakness, dizziness, headache, blurred vision, incontinence, dysuria, numbness, weakness, blurred vision.  Her syncope or syncope.  The patient states that generally recent change in her chronic pain medications and she felt that this is caused her a lot of issues and she has been more anxious due to the fact that her back pain is not sufficiently controlled on her new medication. Patient states she just wants to go home.  Her abdominal pain is currently not present.  Past Medical History  Diagnosis Date  . Arthritis 10-05-12    degenerative joint disease,scoliosis spine  . Dysrhythmia 10-05-12    tachycardia -tx. Lopressor  . GERD (gastroesophageal reflux disease)   . H/O hiatal hernia   . Anemia 10-05-12    hgb-9.0 on 09-14-12  . Arm vein blood clot 10-05-12    Rt. arm '12  . Transfusion history 10-05-12    4 units blood '12  . H/O esophagitis   . Tachycardia    Past Surgical History  Procedure Laterality Date  . Abdominal hysterectomy    . Appendectomy      child  . Cataract extraction, bilateral    . Back surgery  10-05-12    x5-last fusion with plates  . Breast enhancement surgery    . Colonoscopy with propofol N/A 10/23/2012    Procedure: COLONOSCOPY WITH PROPOFOL;  Surgeon: Lear Ng, MD;  Location: [facility];  Service: Endoscopy;  Laterality: N/A;  . Hot hemostasis N/A  10/23/2012    Procedure: HOT HEMOSTASIS (ARGON PLASMA COAGULATION/BICAP);  Surgeon: Lear Ng, MD;  Location: [facility];  Service: Endoscopy;  Laterality: N/A;   No family history on file. Social History  Substance Use Topics  . Smoking status: Never Smoker   . Smokeless tobacco: None  . Alcohol Use: No   OB History    No data available     Review of Systems All other systems negative except as documented in the HPI. All pertinent positives and negatives as reviewed in the HPI.   Allergies  Ciprofloxacin  Home Medications   Prior to Admission medications   Medication Sig Start Date End Date Taking? Authorizing Provider  aspirin 81 MG tablet Take 81 mg by mouth daily.    Historical Provider, MD  benzonatate (TESSALON) 100 MG capsule Take 1 capsule (100 mg total) by mouth 3 (three) times daily as needed for cough. 04/10/15   Harvel Quale, MD  ENSURE PLUS (ENSURE PLUS) LIQD Take 237 mLs by mouth 2 (two) times daily.    Historical Provider, MD  metoprolol (LOPRESSOR) 50 MG tablet Take 50 mg by mouth 2 (two) times daily.    Historical Provider, MD  metroNIDAZOLE (FLAGYL) 500 MG tablet Take 1 tablet (500 mg total) by mouth every 8 (eight) hours. 01/27/14   Janece Canterbury, MD  Multiple Vitamin (MULTIVITAMIN WITH MINERALS) TABS tablet Take 1 tablet by mouth daily.    Historical Provider,  MD  ondansetron (ZOFRAN) 4 MG tablet Take 1 tablet (4 mg total) by mouth every 4 (four) hours as needed for nausea or vomiting. 01/27/14   Janece Canterbury, MD  ondansetron (ZOFRAN-ODT) 4 MG disintegrating tablet Take 4 mg by mouth every 8 (eight) hours as needed for nausea or vomiting.    Historical Provider, MD  oxyCODONE (OXYCONTIN) 40 MG 12 hr tablet Take 40 mg by mouth every 8 (eight) hours.    Historical Provider, MD  oxyCODONE (ROXICODONE) 15 MG immediate release tablet Take 1 tablet (15 mg total) by mouth every 6 (six) hours as needed for moderate pain or severe pain. 01/27/14    Janece Canterbury, MD  pantoprazole (PROTONIX) 40 MG tablet Take 40 mg by mouth 2 (two) times daily.    Historical Provider, MD   BP 172/70 mmHg  Pulse 89  Resp 24  SpO2 95% Physical Exam  Constitutional: She is oriented to person, place, and time. She appears well-developed and well-nourished. No distress.  HENT:  Head: Normocephalic and atraumatic.  Mouth/Throat: Oropharynx is clear and moist.  Eyes: Pupils are equal, round, and reactive to light.  Neck: Normal range of motion. Neck supple.  Cardiovascular: Normal rate, regular rhythm and normal heart sounds.  Exam reveals no gallop and no friction rub.   No murmur heard. Pulmonary/Chest: Effort normal and breath sounds normal. No respiratory distress. She has no wheezes.  Abdominal: Soft. Bowel sounds are normal. She exhibits no distension. There is no tenderness. There is no rebound and no guarding.  Musculoskeletal: She exhibits no edema.  Neurological: She is alert and oriented to person, place, and time. She has normal reflexes. She exhibits normal muscle tone. Coordination normal.  Skin: Skin is warm and dry. No rash noted. No erythema.  Psychiatric: She has a normal mood and affect. Her behavior is normal.  Nursing note and vitals reviewed.   ED Course  Procedures (including critical care time) I have personally reviewed and evaluated these images and lab results as part of my medical decision-making.  The patient does not want any further testing at this time.  She states that she was like to go home.  She is trying to make arrangements with her primary doctor about her pain medication.  I advised her that we will not be able to manage her chronic pain in the emergency department.  Patient understands but still like to be discharged home.  She does not want any blood work drawn.  Advised the patient without doing further testing and may not be able to tell her the full extent of what happened is going on with her.  She still  agrees that she does not Want any testing performed.  Dalia Heading, PA-C 06/19/15 0715  Virgel Manifold, MD 06/21/15 [phone]

## 2015-06-19 NOTE — Discharge Instructions (Signed)
Return here as needed.  Follow-up with your primary care doctor °

## 2016-03-23 ENCOUNTER — Emergency Department (HOSPITAL_COMMUNITY): Payer: Medicare Other

## 2016-03-23 ENCOUNTER — Inpatient Hospital Stay (HOSPITAL_COMMUNITY)
Admission: EM | Admit: 2016-03-23 | Discharge: 2016-03-27 | DRG: 871 | Disposition: A | Discharge status: Skilled Nursing Facility | Payer: Medicare Other | Attending: Internal Medicine | Admitting: Internal Medicine

## 2016-03-23 ENCOUNTER — Encounter (HOSPITAL_COMMUNITY): Payer: Self-pay | Admitting: Emergency Medicine

## 2016-03-23 DIAGNOSIS — Z7982 Long term (current) use of aspirin: Secondary | ICD-10-CM

## 2016-03-23 DIAGNOSIS — A419 Sepsis, unspecified organism: Principal | ICD-10-CM | POA: Diagnosis present

## 2016-03-23 DIAGNOSIS — R0902 Hypoxemia: Secondary | ICD-10-CM | POA: Diagnosis present

## 2016-03-23 DIAGNOSIS — Z881 Allergy status to other antibiotic agents status: Secondary | ICD-10-CM

## 2016-03-23 DIAGNOSIS — E872 Acidosis, unspecified: Secondary | ICD-10-CM

## 2016-03-23 DIAGNOSIS — Z6824 Body mass index (BMI) 24.0-24.9, adult: Secondary | ICD-10-CM

## 2016-03-23 DIAGNOSIS — Z794 Long term (current) use of insulin: Secondary | ICD-10-CM

## 2016-03-23 DIAGNOSIS — Z79891 Long term (current) use of opiate analgesic: Secondary | ICD-10-CM

## 2016-03-23 DIAGNOSIS — I10 Essential (primary) hypertension: Secondary | ICD-10-CM | POA: Diagnosis present

## 2016-03-23 DIAGNOSIS — Z86718 Personal history of other venous thrombosis and embolism: Secondary | ICD-10-CM

## 2016-03-23 DIAGNOSIS — Z79899 Other long term (current) drug therapy: Secondary | ICD-10-CM

## 2016-03-23 DIAGNOSIS — J690 Pneumonitis due to inhalation of food and vomit: Secondary | ICD-10-CM | POA: Diagnosis present

## 2016-03-23 DIAGNOSIS — Z981 Arthrodesis status: Secondary | ICD-10-CM

## 2016-03-23 DIAGNOSIS — K219 Gastro-esophageal reflux disease without esophagitis: Secondary | ICD-10-CM | POA: Diagnosis present

## 2016-03-23 DIAGNOSIS — M549 Dorsalgia, unspecified: Secondary | ICD-10-CM | POA: Diagnosis present

## 2016-03-23 DIAGNOSIS — D72829 Elevated white blood cell count, unspecified: Secondary | ICD-10-CM | POA: Diagnosis present

## 2016-03-23 DIAGNOSIS — G8929 Other chronic pain: Secondary | ICD-10-CM | POA: Diagnosis present

## 2016-03-23 DIAGNOSIS — R339 Retention of urine, unspecified: Secondary | ICD-10-CM | POA: Diagnosis present

## 2016-03-23 DIAGNOSIS — M419 Scoliosis, unspecified: Secondary | ICD-10-CM | POA: Diagnosis present

## 2016-03-23 DIAGNOSIS — D8989 Other specified disorders involving the immune mechanism, not elsewhere classified: Secondary | ICD-10-CM | POA: Diagnosis present

## 2016-03-23 DIAGNOSIS — E43 Unspecified severe protein-calorie malnutrition: Secondary | ICD-10-CM

## 2016-03-23 DIAGNOSIS — R739 Hyperglycemia, unspecified: Secondary | ICD-10-CM | POA: Diagnosis present

## 2016-03-23 DIAGNOSIS — G9340 Encephalopathy, unspecified: Secondary | ICD-10-CM | POA: Diagnosis present

## 2016-03-23 DIAGNOSIS — T402X5A Adverse effect of other opioids, initial encounter: Secondary | ICD-10-CM | POA: Diagnosis present

## 2016-03-23 DIAGNOSIS — Z66 Do not resuscitate: Secondary | ICD-10-CM | POA: Diagnosis present

## 2016-03-23 DIAGNOSIS — K449 Diaphragmatic hernia without obstruction or gangrene: Secondary | ICD-10-CM | POA: Diagnosis present

## 2016-03-23 DIAGNOSIS — E861 Hypovolemia: Secondary | ICD-10-CM | POA: Diagnosis present

## 2016-03-23 DIAGNOSIS — T50995A Adverse effect of other drugs, medicaments and biological substances, initial encounter: Secondary | ICD-10-CM | POA: Diagnosis present

## 2016-03-23 DIAGNOSIS — E871 Hypo-osmolality and hyponatremia: Secondary | ICD-10-CM | POA: Diagnosis present

## 2016-03-23 LAB — URINALYSIS, ROUTINE W REFLEX MICROSCOPIC
Bilirubin Urine: NEGATIVE
GLUCOSE, UA: NEGATIVE mg/dL
Hgb urine dipstick: NEGATIVE
KETONES UR: NEGATIVE mg/dL
LEUKOCYTES UA: NEGATIVE
Nitrite: NEGATIVE
PROTEIN: NEGATIVE mg/dL
Specific Gravity, Urine: 1.009 (ref 1.005–1.030)
pH: 7 (ref 5.0–8.0)

## 2016-03-23 LAB — CBC WITH DIFFERENTIAL/PLATELET
Basophils Absolute: 0 10*3/uL (ref 0.0–0.1)
Basophils Relative: 0 %
EOS ABS: 0 10*3/uL (ref 0.0–0.7)
EOS PCT: 0 %
HCT: 39.3 % (ref 36.0–46.0)
Hemoglobin: 12 g/dL (ref 12.0–15.0)
LYMPHS ABS: 0.9 10*3/uL (ref 0.7–4.0)
LYMPHS PCT: 4 %
MCH: 24.3 pg — AB (ref 26.0–34.0)
MCHC: 30.5 g/dL (ref 30.0–36.0)
MCV: 79.6 fL (ref 78.0–100.0)
MONO ABS: 2.4 10*3/uL — AB (ref 0.1–1.0)
Monocytes Relative: 10 %
Neutro Abs: 20.3 10*3/uL — ABNORMAL HIGH (ref 1.7–7.7)
Neutrophils Relative %: 86 %
PLATELETS: 643 10*3/uL — AB (ref 150–400)
RBC: 4.94 MIL/uL (ref 3.87–5.11)
RDW: 17.2 % — AB (ref 11.5–15.5)
WBC: 23.6 10*3/uL — AB (ref 4.0–10.5)

## 2016-03-23 LAB — BLOOD GAS, VENOUS
ACID-BASE EXCESS: 1.6 mmol/L (ref 0.0–2.0)
BICARBONATE: 25.2 mmol/L (ref 20.0–28.0)
Drawn by: 229371
FIO2: 21
O2 Saturation: 39.4 %
PATIENT TEMPERATURE: 97.2
PH VEN: 7.451 — AB (ref 7.250–7.430)
pCO2, Ven: 36.4 mmHg — ABNORMAL LOW (ref 44.0–60.0)

## 2016-03-23 LAB — COMPREHENSIVE METABOLIC PANEL
ALT: 15 U/L (ref 14–54)
ANION GAP: 13 (ref 5–15)
AST: 43 U/L — ABNORMAL HIGH (ref 15–41)
Albumin: 4.3 g/dL (ref 3.5–5.0)
Alkaline Phosphatase: 47 U/L (ref 38–126)
BUN: 19 mg/dL (ref 6–20)
CHLORIDE: 97 mmol/L — AB (ref 101–111)
CO2: 22 mmol/L (ref 22–32)
Calcium: 9.6 mg/dL (ref 8.9–10.3)
Creatinine, Ser: 0.98 mg/dL (ref 0.44–1.00)
GFR, EST NON AFRICAN AMERICAN: 53 mL/min — AB (ref >60)
Glucose, Bld: 153 mg/dL — ABNORMAL HIGH (ref 65–99)
POTASSIUM: 4.3 mmol/L (ref 3.5–5.1)
SODIUM: 132 mmol/L — AB (ref 135–145)
Total Bilirubin: 0.3 mg/dL (ref 0.3–1.2)
Total Protein: 7.9 g/dL (ref 6.5–8.1)

## 2016-03-23 LAB — I-STAT CG4 LACTIC ACID, ED
LACTIC ACID, VENOUS: 4.74 mmol/L — AB (ref 0.5–1.9)
LACTIC ACID, VENOUS: 3.83 mmol/L — AB (ref 0.5–1.9)

## 2016-03-23 MED ORDER — IOPAMIDOL (ISOVUE-300) INJECTION 61%
INTRAVENOUS | Status: AC
  Filled 2016-03-23: qty 100

## 2016-03-23 MED ORDER — SODIUM CHLORIDE 0.9 % IV BOLUS (SEPSIS)
1000.0000 mL | Freq: Once | INTRAVENOUS | Status: AC
  Administered 2016-03-23 (×2): 1000 mL via INTRAVENOUS

## 2016-03-23 MED ORDER — SODIUM CHLORIDE 0.9 % IJ SOLN
INTRAMUSCULAR | Status: AC
  Filled 2016-03-23: qty 50

## 2016-03-23 MED ORDER — PIPERACILLIN-TAZOBACTAM 3.375 G IVPB 30 MIN
3.3750 g | Freq: Once | INTRAVENOUS | Status: AC
  Administered 2016-03-23: 3.375 g via INTRAVENOUS
  Filled 2016-03-23: qty 50

## 2016-03-23 MED ORDER — SODIUM CHLORIDE 0.9 % IV BOLUS (SEPSIS)
1000.0000 mL | Freq: Once | INTRAVENOUS | Status: AC
Start: 2016-03-23 — End: 2016-03-23
  Administered 2016-03-23: 1000 mL via INTRAVENOUS

## 2016-03-23 MED ORDER — OXYCODONE HCL 5 MG PO TABS
5.0000 mg | ORAL_TABLET | Freq: Once | ORAL | Status: AC
  Administered 2016-03-23: 5 mg via ORAL
  Filled 2016-03-23: qty 1

## 2016-03-23 MED ORDER — SODIUM CHLORIDE 0.9 % IV BOLUS (SEPSIS)
1000.0000 mL | Freq: Once | INTRAVENOUS | Status: AC
  Administered 2016-03-23: 1000 mL via INTRAVENOUS

## 2016-03-23 MED ORDER — IOPAMIDOL (ISOVUE-300) INJECTION 61%
100.0000 mL | Freq: Once | INTRAVENOUS | Status: AC | PRN
  Administered 2016-03-23: 100 mL via INTRAVENOUS

## 2016-03-23 MED ORDER — VANCOMYCIN HCL IN DEXTROSE 1-5 GM/200ML-% IV SOLN
1000.0000 mg | Freq: Once | INTRAVENOUS | Status: AC
  Administered 2016-03-23: 1000 mg via INTRAVENOUS
  Filled 2016-03-23: qty 200

## 2016-03-23 NOTE — ED Triage Notes (Signed)
Pt comes from home, via ems, AMS, last know well time is 8am ,  Pt has care taker that comes by one a week.  Caretaker called ems, saying her pt was not acting right, pupils are pin-point, acting outside baseline. V/s on arrival 124/88, hr 130, cbg293, 24 rr, 78, room air, 99 on mom breather, sinus tachy with PACs. Pt may have over done on her pain meds.

## 2016-03-23 NOTE — Progress Notes (Signed)
EDCM spoke to patient and her caregiver at bedside.  Patient lives at home alone.  Caregiver at bedside reports she comes to see th patient once a week.  She reports she does patient's laundry, takes her to doctor appointments etc.  Patient reports she receives meals on wheels.  Patient reports she has a son who lives in [place], no other family.  Patient has a walker and a stair lift at home.  Patient confirms her pcp is Dr. Alroy Dust.  Patient has never had home health services before.  EDCM provided patient's caregiver with list of home health agencies in [facility], explained services.  Also provided list of private duty nursing agencies and and contact information for choice connections.  Patient's caregiver thankful for services.  No further EDCM needs at this time.

## 2016-03-23 NOTE — ED Notes (Signed)
Lactic Acid= 4.74, MD Isaacs notified

## 2016-03-23 NOTE — ED Provider Notes (Signed)
[facility] DEPT Provider Note   CSN: [id number] Arrival date & time: 03/23/16  1856     History   Chief Complaint Chief Complaint  Patient presents with  . Altered Mental Status    HPI [name] is a 82 y.o. female.    82 year old female with past medical history as below who presents with generalized weakness and confusion. Patient reportedly was found in her house today acutely more confused than usual. She believes that she may have taken more of her medications but denies any intentional overdose. Remainder of history is severely limited secondary to confusion. Patient denies complaints. She states she feels "just so tired" but denies pain. She does endorse mild productive cough. No chest pain.  Per discussion with son [phone]), pt normally alert, oriented, fairly independent. He called her this AM and she seemed confused, but denies hearing of any complaints.  Level 5 caveat invoked as remainder of history, ROS, and physical exam limited due to patient's AMS, confusion.   Past Medical History:  Diagnosis Date  . Anemia 10-05-12   hgb-9.0 on 09-14-12  . Arm vein blood clot 10-05-12   Rt. arm '12  . Arthritis 10-05-12   degenerative joint disease,scoliosis spine  . Dysrhythmia 10-05-12   tachycardia -tx. Lopressor  . GERD (gastroesophageal reflux disease)   . H/O esophagitis   . H/O hiatal hernia   . Tachycardia   . Transfusion history 10-05-12   4 units blood '12    Patient Active Problem List   Diagnosis Date Noted  . Sepsis ([place]) 03/24/2016  . Aspiration pneumonia ([place]) 03/24/2016  . Leukocytosis 01/27/2014  . Sinus tachycardia 01/27/2014  . Essential hypertension 01/27/2014  . Colitis 01/24/2014  . Iron deficiency anemia 10/23/2012  . Benign neoplasm of colon 10/23/2012    Past Surgical History:  Procedure Laterality Date  . ABDOMINAL HYSTERECTOMY    . APPENDECTOMY     child  . BACK SURGERY  10-05-12   x5-last fusion with plates  . BREAST  ENHANCEMENT SURGERY    . CATARACT EXTRACTION, BILATERAL    . COLONOSCOPY WITH PROPOFOL N/A 10/23/2012   Procedure: COLONOSCOPY WITH PROPOFOL;  Surgeon: Lear Ng, MD;  Location: [facility]COPY;  Service: Endoscopy;  Laterality: N/A;  . HOT HEMOSTASIS N/A 10/23/2012   Procedure: HOT HEMOSTASIS (ARGON PLASMA COAGULATION/BICAP);  Surgeon: Lear Ng, MD;  Location: [facility];  Service: Endoscopy;  Laterality: N/A;    OB History    No data available       Home Medications    Prior to Admission medications   Medication Sig Start Date End Date Taking? Authorizing Provider  aspirin 81 MG tablet Take 81 mg by mouth at bedtime.    Yes Historical Provider, MD  ENSURE PLUS (ENSURE PLUS) LIQD Take 237 mLs by mouth 2 (two) times daily.   Yes Historical Provider, MD  metoprolol (LOPRESSOR) 50 MG tablet Take 50 mg by mouth 2 (two) times daily.   Yes Historical Provider, MD  Multiple Vitamin (MULTIVITAMIN WITH MINERALS) TABS tablet Take 1 tablet by mouth daily.   Yes Historical Provider, MD  oxyCODONE (OXYCONTIN) 40 MG 12 hr tablet Take 40 mg by mouth every 8 (eight) hours.   Yes Historical Provider, MD  Oxycodone-Acetaminophen (PERCOCET PO) Take 1 tablet by mouth every 6 (six) hours as needed (breakthrough pain).   Yes Historical Provider, MD  pantoprazole (PROTONIX) 40 MG tablet Take 40 mg by mouth 2 (two) times daily.   Yes Historical Provider,  MD  benzonatate (TESSALON) 100 MG capsule Take 1 capsule (100 mg total) by mouth 3 (three) times daily as needed for cough. Patient not taking: Reported on 03/23/2016 04/10/15   Harvel Quale, MD  metroNIDAZOLE (FLAGYL) 500 MG tablet Take 1 tablet (500 mg total) by mouth every 8 (eight) hours. Patient not taking: Reported on 03/23/2016 01/27/14   Janece Canterbury, MD  ondansetron (ZOFRAN) 4 MG tablet Take 1 tablet (4 mg total) by mouth every 4 (four) hours as needed for nausea or vomiting. Patient not taking: Reported on 03/23/2016 01/27/14    Janece Canterbury, MD  oxyCODONE (ROXICODONE) 15 MG immediate release tablet Take 1 tablet (15 mg total) by mouth every 6 (six) hours as needed for moderate pain or severe pain. Patient not taking: Reported on 03/23/2016 01/27/14   Janece Canterbury, MD    Family History Family History  Problem Relation Age of Onset  . Cancer Mother   . Cancer Father   . Diabetes Sister   . Stroke Neg Hx   . CAD Neg Hx     Social History Social History  Substance Use Topics  . Smoking status: Never Smoker  . Smokeless tobacco: Never Used  . Alcohol use No     Allergies   Ciprofloxacin   Review of Systems Review of Systems  Unable to perform ROS: Mental status change     Physical Exam Updated Vital Signs BP 153/66   Pulse 108   Temp (!) 96.5 F (35.8 C) (Axillary)   Resp 13   Ht 5\' 3"  (1.6 m)   Wt 140 lb (63.5 kg)   SpO2 98%   BMI 24.80 kg/m   Physical Exam  Constitutional: She appears well-developed and well-nourished. No distress.  HENT:  Head: Normocephalic and atraumatic.  Dry mucous membranes  Eyes: Conjunctivae are normal. Pupils are equal, round, and reactive to light.  Neck: Neck supple.  Cardiovascular: Normal rate, regular rhythm and normal heart sounds.  Exam reveals no friction rub.   No murmur heard. Pulmonary/Chest: Effort normal. No respiratory distress. She has no wheezes. She has rales (bibasilar).  Abdominal: She exhibits no distension.  Musculoskeletal: She exhibits no edema.  Neurological: She is alert. She exhibits normal muscle tone.  Confused, oriented to person only. MAE with 5/5 strength.  Skin: Skin is warm. Capillary refill takes less than 2 seconds.  Psychiatric: She has a normal mood and affect.  Nursing note and vitals reviewed.    ED Treatments / Results  Labs (all labs ordered are listed, but only abnormal results are displayed) Labs Reviewed  CBC WITH DIFFERENTIAL/PLATELET - Abnormal; Notable for the following:       Result Value    WBC 23.6 (*)    MCH 24.3 (*)    RDW 17.2 (*)    Platelets 643 (*)    Neutro Abs 20.3 (*)    Monocytes Absolute 2.4 (*)    All other components within normal limits  COMPREHENSIVE METABOLIC PANEL - Abnormal; Notable for the following:    Sodium 132 (*)    Chloride 97 (*)    Glucose, Bld 153 (*)    AST 43 (*)    GFR calc non Af Amer 53 (*)    All other components within normal limits  BLOOD GAS, VENOUS - Abnormal; Notable for the following:    pH, Ven 7.451 (*)    pCO2, Ven 36.4 (*)    All other components within normal limits  I-STAT CG4 LACTIC ACID,  ED - Abnormal; Notable for the following:    Lactic Acid, Venous 4.74 (*)    All other components within normal limits  I-STAT CG4 LACTIC ACID, ED - Abnormal; Notable for the following:    Lactic Acid, Venous 3.83 (*)    All other components within normal limits  URINALYSIS, ROUTINE W REFLEX MICROSCOPIC  INFLUENZA PANEL BY PCR (TYPE A & B, H1N1)  I-STAT CG4 LACTIC ACID, ED    EKG  EKG Interpretation  Date/Time:  Wednesday March 23 2016 19:45:58 EST Ventricular Rate:  118 PR Interval:    QRS Duration: 79 QT Interval:  307 QTC Calculation: 431 R Axis:   -35 Text Interpretation:  Sinus tachycardia Atrial premature complex Inferior infarct, old Anterior infarct, old Lateral leads are also involved No significant change since last tracing other than increased heart rate Confirmed by Mahnoor Mathisen MD, Meg Niemeier [phone]) on 03/24/2016 12:58:19 AM       Radiology Dg Chest 2 View  Result Date: 03/23/2016 CLINICAL DATA:  Altered mental status EXAM: CHEST  2 VIEW COMPARISON:  04/10/2015 FINDINGS: Elevated right hemidiaphragm unchanged. Right lower lobe atelectasis unchanged. Negative for pneumonia. Negative for heart failure. Moderate hiatal hernia with air-fluid level. IMPRESSION: No active cardiopulmonary disease. Electronically Signed   By: Franchot Gallo M.D.   On: 03/23/2016 20:27   Ct Head Wo Contrast  Result Date:  03/23/2016 CLINICAL DATA:  Altered mental status EXAM: CT HEAD WITHOUT CONTRAST TECHNIQUE: Contiguous axial images were obtained from the base of the skull through the vertex without intravenous contrast. COMPARISON:  CT head 05/22/2006 FINDINGS: Brain: Progressive atrophy since the prior study. Chronic microvascular ischemic change in the white matter also mildly progressive. 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 Negative for acute infarct.  Negative for hemorrhage or mass. Vascular: No hyperdense vessel or unexpected calcification. Skull: Negative Sinuses/Orbits: Negative Other: None IMPRESSION: Atrophy and chronic microvascular ischemia have progressed since 2008. No acute intracranial abnormality. Electronically Signed   By: Franchot Gallo M.D.   On: 03/23/2016 20:29   Ct Abdomen Pelvis W Contrast  Result Date: 03/24/2016 CLINICAL DATA:  Sepsis, fever, abdominal pain. EXAM: CT ABDOMEN AND PELVIS WITH CONTRAST TECHNIQUE: Multidetector CT imaging of the abdomen and pelvis was performed using the standard protocol following bolus administration of intravenous contrast. CONTRAST:  157mL ISOVUE-300 IOPAMIDOL (ISOVUE-300) INJECTION 61% COMPARISON:  CT 01/24/2014 FINDINGS: Lower chest: Reticulonodular opacities in the visualized right lung. Mild fissural thickening. Breathing motion artifact limits assessment. Moderate hiatal hernia Hepatobiliary: Cyst near the gallbladder fossa. Small hepatic granulomas. Gallbladder physiologically distended, no calcified stone. No biliary dilatation. Pancreas: Motion artifact limits assessment. Portions of the pancreatic body and tail extended through hiatal hernia. Spleen: Small hypodensities in the mid upper spleen with adjacent contour deformity Ing, unchanged from prior CT. Adrenals/Urinary Tract: No adrenal nodule. No hydronephrosis or perinephric edema. Motion artifact limits detailed assessment. Symmetric excretion on delayed phase imaging. Urinary bladder  is distended, no wall thickening. Stomach/Bowel: Moderate hiatal hernia. No bowel wall thickening or inflammation. Motion artifact through the mid abdomen. Moderate stool burden throughout. There is distal colonic diverticulosis without acute diverticulitis. There is pelvic floor descent. The appendix is not visualized. Vascular/Lymphatic: Aortic atherosclerosis and tortuosity. No aneurysm. No definite adenopathy, motion limits evaluation. Reproductive: Uterus is not seen, may be surgically absent or atrophic. Other: No evidence of free air free fluid. Musculoskeletal: Scoliotic curvature in the spine. Posterior fusion hardware L1 through the sacrum. There are no acute or suspicious osseous abnormalities. IMPRESSION: 1.  No acute finding in the abdomen/pelvis. 2. Reticulonodular opacities in the right lung, may be infectious or inflammatory. Aspiration not excluded. 3. Chronic findings include colonic diverticulosis without diverticulitis, chronic splenic deformities, hiatal hernia and atherosclerosis. Electronically Signed   By: Jeb Levering M.D.   On: 03/24/2016 00:36    Procedures .Critical Care Performed by: Duffy Bruce Authorized by: Duffy Bruce   Critical care provider statement:    Critical care time (minutes):  35   Critical care time was exclusive of:  Separately billable procedures and treating other patients   Critical care was necessary to treat or prevent imminent or life-threatening deterioration of the following conditions:  Sepsis and dehydration   Critical care was time spent personally by me on the following activities:  Blood draw for specimens, development of treatment plan with patient or surrogate, discussions with consultants, evaluation of patient's response to treatment, examination of patient, obtaining history from patient or surrogate, ordering and performing treatments and interventions, ordering and review of laboratory studies, ordering and review of radiographic  studies, pulse oximetry, re-evaluation of patient's condition and review of old charts   I assumed direction of critical care for this patient from another provider in my specialty: no     (including critical care time)  Medications Ordered in ED Medications  iopamidol (ISOVUE-300) 61 % injection (not administered)  sodium chloride 0.9 % injection (not administered)  sodium chloride 0.9 % bolus 1,000 mL (0 mLs Intravenous Stopped 03/23/16 2247)  sodium chloride 0.9 % bolus 1,000 mL (0 mLs Intravenous Stopped 03/23/16 2247)    And  sodium chloride 0.9 % bolus 1,000 mL (1,000 mLs Intravenous New Bag/Given 03/23/16 2225)  vancomycin (VANCOCIN) IVPB 1000 mg/200 mL premix (1,000 mg Intravenous New Bag/Given 03/23/16 2246)  piperacillin-tazobactam (ZOSYN) IVPB 3.375 g (0 g Intravenous Stopped 03/23/16 2246)  oxyCODONE (Oxy IR/ROXICODONE) immediate release tablet 5 mg (5 mg Oral Given 03/23/16 2223)  iopamidol (ISOVUE-300) 61 % injection 100 mL (100 mLs Intravenous Contrast Given 03/23/16 2351)     Initial Impression / Assessment and Plan / ED Course  I have reviewed the triage vital signs and the nursing notes.  Pertinent labs & imaging results that were available during my care of the patient were reviewed by me and considered in my medical decision making (see chart for details).  Clinical Course     82 yo F with PMhx as above here with generalized confusion. On arrival, pt mildly hypothermic, tachycardic, but with normal BP. Exam as above. No focal neurological deficits. Suspect delirium/encephalopathy 2/2 occult infection, metabolic encephalopathy, possible polypharmacy. Will check labs, give IVF, re-assess.  Labs c/w severe sepsis with lactic acidosis, leukocytosis. Code sepsis initiated with vanc/zosyn. UA normal. CXR clear. Source of infection unclear - question early PNA/pulm infection versus intra-abdominal infection given degree of LA. Will check CT.  CT shows no acute abnormality. She  does have reactive pulmonary LAD c/w possible pulmonary infection. LA is downtrending. Will continue fluids, admit to hospitalist.  Son: [name] [phone]  Final Clinical Impressions(s) / ED Diagnoses   Final diagnoses:  Sepsis, due to unspecified organism ([place])  Lactic acidosis  Leukocytosis, unspecified type    New Prescriptions New Prescriptions   No medications on file     Duffy Bruce, MD 03/24/16 0122

## 2016-03-23 NOTE — ED Notes (Signed)
Pt is aware urine sample is needed. 

## 2016-03-23 NOTE — ED Notes (Signed)
Tech collected regina in and out

## 2016-03-23 NOTE — Progress Notes (Signed)
  Pharmacy Antibiotic Note  SHARYAH HWA is a 82 y.o. female admitted on 03/23/2016 with AMS, hypoxia, tachycardia, and elevated lactate.  Pharmacy has been consulted for vancomycin and Zosyn dosing for sepsis.  Plan: Vanc 1000 mg x 1 IV Zosyn 3.375 g x 1 IV F/u SCr and weight for maintenance dosing     Temp (24hrs), Avg:96.9 F (36.1 C), Min:96.5 F (35.8 C), Max:97.2 F (36.2 C)   Recent Labs Lab 03/23/16 2043 03/23/16 2055  WBC 23.6*  --   CREATININE 0.98  --   LATICACIDVEN  --  4.74*    CrCl cannot be calculated (Unknown ideal weight.).    Allergies  Allergen Reactions  . Ciprofloxacin Rash    burning    Antimicrobials this admission: 12/6 vancomycin >>  12/6 Zosyn >>   Dose adjustments this admission: ---  Microbiology results: None yet  Thank you for allowing pharmacy to be a part of this patient's care.  Reuel Boom, PharmD, BCPS Pager: [phone] 03/23/2016, 10:20 PM

## 2016-03-24 ENCOUNTER — Other Ambulatory Visit: Payer: Self-pay

## 2016-03-24 ENCOUNTER — Encounter (HOSPITAL_COMMUNITY): Payer: Self-pay | Admitting: Internal Medicine

## 2016-03-24 DIAGNOSIS — D72829 Elevated white blood cell count, unspecified: Secondary | ICD-10-CM | POA: Diagnosis not present

## 2016-03-24 DIAGNOSIS — R0902 Hypoxemia: Secondary | ICD-10-CM | POA: Diagnosis present

## 2016-03-24 DIAGNOSIS — M549 Dorsalgia, unspecified: Secondary | ICD-10-CM | POA: Diagnosis present

## 2016-03-24 DIAGNOSIS — Z86718 Personal history of other venous thrombosis and embolism: Secondary | ICD-10-CM | POA: Diagnosis not present

## 2016-03-24 DIAGNOSIS — E861 Hypovolemia: Secondary | ICD-10-CM | POA: Diagnosis present

## 2016-03-24 DIAGNOSIS — M419 Scoliosis, unspecified: Secondary | ICD-10-CM | POA: Diagnosis present

## 2016-03-24 DIAGNOSIS — K219 Gastro-esophageal reflux disease without esophagitis: Secondary | ICD-10-CM | POA: Diagnosis present

## 2016-03-24 DIAGNOSIS — E43 Unspecified severe protein-calorie malnutrition: Secondary | ICD-10-CM | POA: Diagnosis present

## 2016-03-24 DIAGNOSIS — Z66 Do not resuscitate: Secondary | ICD-10-CM | POA: Diagnosis present

## 2016-03-24 DIAGNOSIS — Z794 Long term (current) use of insulin: Secondary | ICD-10-CM | POA: Diagnosis not present

## 2016-03-24 DIAGNOSIS — Z6824 Body mass index (BMI) 24.0-24.9, adult: Secondary | ICD-10-CM | POA: Diagnosis not present

## 2016-03-24 DIAGNOSIS — G8929 Other chronic pain: Secondary | ICD-10-CM | POA: Diagnosis present

## 2016-03-24 DIAGNOSIS — J690 Pneumonitis due to inhalation of food and vomit: Secondary | ICD-10-CM | POA: Diagnosis present

## 2016-03-24 DIAGNOSIS — R739 Hyperglycemia, unspecified: Secondary | ICD-10-CM | POA: Diagnosis present

## 2016-03-24 DIAGNOSIS — G9340 Encephalopathy, unspecified: Secondary | ICD-10-CM | POA: Diagnosis present

## 2016-03-24 DIAGNOSIS — A419 Sepsis, unspecified organism: Secondary | ICD-10-CM | POA: Diagnosis present

## 2016-03-24 DIAGNOSIS — Z981 Arthrodesis status: Secondary | ICD-10-CM | POA: Diagnosis not present

## 2016-03-24 DIAGNOSIS — R339 Retention of urine, unspecified: Secondary | ICD-10-CM | POA: Diagnosis present

## 2016-03-24 DIAGNOSIS — T402X5A Adverse effect of other opioids, initial encounter: Secondary | ICD-10-CM | POA: Diagnosis present

## 2016-03-24 DIAGNOSIS — D8989 Other specified disorders involving the immune mechanism, not elsewhere classified: Secondary | ICD-10-CM | POA: Diagnosis present

## 2016-03-24 DIAGNOSIS — T50995A Adverse effect of other drugs, medicaments and biological substances, initial encounter: Secondary | ICD-10-CM | POA: Diagnosis present

## 2016-03-24 DIAGNOSIS — I10 Essential (primary) hypertension: Secondary | ICD-10-CM | POA: Diagnosis present

## 2016-03-24 DIAGNOSIS — E872 Acidosis: Secondary | ICD-10-CM | POA: Diagnosis present

## 2016-03-24 DIAGNOSIS — K449 Diaphragmatic hernia without obstruction or gangrene: Secondary | ICD-10-CM | POA: Diagnosis present

## 2016-03-24 DIAGNOSIS — E871 Hypo-osmolality and hyponatremia: Secondary | ICD-10-CM | POA: Diagnosis present

## 2016-03-24 LAB — PROTIME-INR
INR: 1.14
Prothrombin Time: 14.6 seconds (ref 11.4–15.2)

## 2016-03-24 LAB — GLUCOSE, CAPILLARY
GLUCOSE-CAPILLARY: 139 mg/dL — AB (ref 65–99)
GLUCOSE-CAPILLARY: 141 mg/dL — AB (ref 65–99)
GLUCOSE-CAPILLARY: 104 mg/dL — AB (ref 65–99)
Glucose-Capillary: 141 mg/dL — ABNORMAL HIGH (ref 65–99)
Glucose-Capillary: 102 mg/dL — ABNORMAL HIGH (ref 65–99)

## 2016-03-24 LAB — COMPREHENSIVE METABOLIC PANEL
ALT: 17 U/L (ref 14–54)
ANION GAP: 7 (ref 5–15)
AST: 51 U/L — ABNORMAL HIGH (ref 15–41)
Albumin: 3.4 g/dL — ABNORMAL LOW (ref 3.5–5.0)
Alkaline Phosphatase: 36 U/L — ABNORMAL LOW (ref 38–126)
BUN: 12 mg/dL (ref 6–20)
CHLORIDE: 103 mmol/L (ref 101–111)
CO2: 23 mmol/L (ref 22–32)
Calcium: 8.2 mg/dL — ABNORMAL LOW (ref 8.9–10.3)
Creatinine, Ser: 0.64 mg/dL (ref 0.44–1.00)
GFR calc non Af Amer: >60 mL/min (ref >60)
Glucose, Bld: 131 mg/dL — ABNORMAL HIGH (ref 65–99)
POTASSIUM: 3.5 mmol/L (ref 3.5–5.1)
SODIUM: 133 mmol/L — AB (ref 135–145)
Total Bilirubin: 0.4 mg/dL (ref 0.3–1.2)
Total Protein: 6.3 g/dL — ABNORMAL LOW (ref 6.5–8.1)

## 2016-03-24 LAB — INFLUENZA PANEL BY PCR (TYPE A & B)
Influenza A By PCR: NEGATIVE
Influenza B By PCR: NEGATIVE

## 2016-03-24 LAB — PHOSPHORUS: Phosphorus: 2.6 mg/dL (ref 2.5–4.6)

## 2016-03-24 LAB — TROPONIN I
TROPONIN I: 0.04 ng/mL — AB (ref <0.03)
Troponin I: 0.03 ng/mL (ref <0.03)
Troponin I: <0.03 ng/mL (ref <0.03)

## 2016-03-24 LAB — CBC
HEMATOCRIT: 30.4 % — AB (ref 36.0–46.0)
HEMOGLOBIN: 9.6 g/dL — AB (ref 12.0–15.0)
MCH: 25 pg — AB (ref 26.0–34.0)
MCHC: 31.6 g/dL (ref 30.0–36.0)
MCV: 79.2 fL (ref 78.0–100.0)
Platelets: 521 10*3/uL — ABNORMAL HIGH (ref 150–400)
RBC: 3.84 MIL/uL — AB (ref 3.87–5.11)
RDW: 17.5 % — ABNORMAL HIGH (ref 11.5–15.5)
WBC: 11.4 10*3/uL — ABNORMAL HIGH (ref 4.0–10.5)

## 2016-03-24 LAB — STREP PNEUMONIAE URINARY ANTIGEN: STREP PNEUMO URINARY ANTIGEN: NEGATIVE

## 2016-03-24 LAB — LACTIC ACID, PLASMA
LACTIC ACID, VENOUS: 1.2 mmol/L (ref 0.5–1.9)
Lactic Acid, Venous: 1.5 mmol/L (ref 0.5–1.9)

## 2016-03-24 LAB — MRSA PCR SCREENING: MRSA by PCR: NEGATIVE

## 2016-03-24 LAB — TSH: TSH: 1.38 u[IU]/mL (ref 0.350–4.500)

## 2016-03-24 LAB — MAGNESIUM: MAGNESIUM: 1.8 mg/dL (ref 1.7–2.4)

## 2016-03-24 LAB — APTT: APTT: 31 s (ref 24–36)

## 2016-03-24 LAB — PROCALCITONIN: PROCALCITONIN: 0.43 ng/mL

## 2016-03-24 MED ORDER — TRAMADOL HCL 50 MG PO TABS
100.0000 mg | ORAL_TABLET | Freq: Four times a day (QID) | ORAL | Status: DC | PRN
  Administered 2016-03-24: 100 mg via ORAL
  Filled 2016-03-24: qty 2

## 2016-03-24 MED ORDER — SODIUM CHLORIDE 0.9% FLUSH
3.0000 mL | Freq: Two times a day (BID) | INTRAVENOUS | Status: DC
  Administered 2016-03-24 – 2016-03-26 (×5): 3 mL via INTRAVENOUS

## 2016-03-24 MED ORDER — OXYCODONE HCL ER 20 MG PO T12A
40.0000 mg | EXTENDED_RELEASE_TABLET | Freq: Two times a day (BID) | ORAL | Status: DC
  Administered 2016-03-24 – 2016-03-27 (×7): 40 mg via ORAL
  Filled 2016-03-24 (×7): qty 2

## 2016-03-24 MED ORDER — ONDANSETRON HCL 4 MG PO TABS: 4.0000 mg | ORAL_TABLET | ORAL | Status: DC | PRN

## 2016-03-24 MED ORDER — OXYCODONE HCL 5 MG PO TABS
5.0000 mg | ORAL_TABLET | Freq: Once | ORAL | Status: AC
  Administered 2016-03-24: 5 mg via ORAL
  Filled 2016-03-24: qty 1

## 2016-03-24 MED ORDER — OXYCODONE-ACETAMINOPHEN 5-325 MG PO TABS
1.0000 | ORAL_TABLET | Freq: Four times a day (QID) | ORAL | Status: DC | PRN
  Administered 2016-03-24 – 2016-03-25 (×3): 1 via ORAL
  Administered 2016-03-26 – 2016-03-27 (×3): 2 via ORAL
  Filled 2016-03-24 (×2): qty 1
  Filled 2016-03-24 (×3): qty 2
  Filled 2016-03-24: qty 1

## 2016-03-24 MED ORDER — INSULIN ASPART 100 UNIT/ML ~~LOC~~ SOLN
0.0000 [IU] | SUBCUTANEOUS | Status: DC
  Administered 2016-03-24 – 2016-03-27 (×7): 1 [IU] via SUBCUTANEOUS
  Administered 2016-03-27: 2 [IU] via SUBCUTANEOUS

## 2016-03-24 MED ORDER — METOPROLOL TARTRATE 25 MG PO TABS: 25.0000 mg | ORAL_TABLET | Freq: Two times a day (BID) | ORAL | Status: DC

## 2016-03-24 MED ORDER — VANCOMYCIN HCL IN DEXTROSE 1-5 GM/200ML-% IV SOLN
1000.0000 mg | INTRAVENOUS | Status: DC
  Administered 2016-03-25 – 2016-03-26 (×3): 1000 mg via INTRAVENOUS
  Filled 2016-03-24 (×3): qty 200

## 2016-03-24 MED ORDER — CEFEPIME HCL 1 G IJ SOLR
1.0000 g | Freq: Two times a day (BID) | INTRAMUSCULAR | Status: DC
  Administered 2016-03-24 – 2016-03-25 (×3): 1 g via INTRAVENOUS
  Filled 2016-03-24 (×4): qty 1

## 2016-03-24 MED ORDER — ASPIRIN EC 81 MG PO TBEC
81.0000 mg | DELAYED_RELEASE_TABLET | Freq: Every day | ORAL | Status: DC
  Administered 2016-03-24 – 2016-03-26 (×3): 81 mg via ORAL
  Filled 2016-03-24 (×3): qty 1

## 2016-03-24 MED ORDER — ONDANSETRON HCL 4 MG/2ML IJ SOLN: 4.0000 mg | Freq: Four times a day (QID) | INTRAMUSCULAR | Status: DC | PRN

## 2016-03-24 MED ORDER — METOPROLOL TARTRATE 25 MG PO TABS
25.0000 mg | ORAL_TABLET | Freq: Two times a day (BID) | ORAL | Status: DC
  Administered 2016-03-24 – 2016-03-27 (×7): 25 mg via ORAL
  Filled 2016-03-24 (×7): qty 1

## 2016-03-24 MED ORDER — SODIUM CHLORIDE 0.9 % IV SOLN
INTRAVENOUS | Status: AC
  Administered 2016-03-24: 04:00:00 via INTRAVENOUS

## 2016-03-24 MED ORDER — PIPERACILLIN-TAZOBACTAM 3.375 G IVPB
3.3750 g | Freq: Three times a day (TID) | INTRAVENOUS | Status: DC
  Administered 2016-03-24: 3.375 g via INTRAVENOUS
  Filled 2016-03-24: qty 50

## 2016-03-24 MED ORDER — ONDANSETRON HCL 4 MG PO TABS: 4.0000 mg | ORAL_TABLET | Freq: Four times a day (QID) | ORAL | Status: DC | PRN

## 2016-03-24 NOTE — Progress Notes (Signed)
[facility] PROGRESS NOTE    Progress Note  [name]  [id number] DOB: 26-May-1932 DOA: 03/23/2016 PCP: [name], MD     Brief Narrative:   [name] is an 82 y.o. female past medical history of chronic back pain, C. difficile, recurrent tachycardia esophageal ulcer is brought in by her caregiver for worsening confusion and cough on arrival to the ED she had pinpoint pupils with a heart rate of 1 3778% on room air she was put on nonrebreather came up to 99.  Assessment/Plan:  Sepsis in the setting of possible aspiration pneumonia: She was started empirically on broad spectrum antibiotics IV vancomycin and Zosyn, will DC Zosyn start cefepime. Blood cultures have been sent, she has been fluid resuscitated, her lactic acidosis resolved. Her blood pressure regimen stable.    Acute encephalopathy: Question due to polypharmacy. Now resolved, after holding narcotics for several hours. She does not awaken to carry on a conversation complaining about pain Physical therapy evaluation is pending.  Leukocytosis: Likely due to infectious etiology.  Hyponatremia: Likely due to hypovolemia she has been fluid resuscitated now improved.  Essential hypertension Continue hold anti-hypertensive medications blood pressure stable now. Continue metoprolol.  Hyperglycemia Likely due to stressors, now well controlled. Continue sliding scale insulin.  Severe protein caloric malnutrition  DVT prophylaxis: lovenox Family Communication:none Disposition Plan/Barrier to D/C: home in am Code Status:     Code Status Orders        Start     Ordered   03/24/16 0410  Full code  Continuous     03/24/16 0409    Code Status History    Date Active Date Inactive Code Status Order ID Comments User Context   01/25/2014  1:03 AM 01/27/2014  6:15 PM Full Code [id number]  Theodis Blaze, MD Inpatient    Advance Directive Documentation   Flowsheet Row Most Recent Value  Type of  Advance Directive  Healthcare Power of Attorney, Living will  Pre-existing out of facility DNR order (yellow form or pink MOST form)  No data  "MOST" Form in Place?  No data        IV Access:    Peripheral IV   Procedures and diagnostic studies:   Dg Chest 2 View  Result Date: 03/23/2016 CLINICAL DATA:  Altered mental status EXAM: CHEST  2 VIEW COMPARISON:  04/10/2015 FINDINGS: Elevated right hemidiaphragm unchanged. Right lower lobe atelectasis unchanged. Negative for pneumonia. Negative for heart failure. Moderate hiatal hernia with air-fluid level. IMPRESSION: No active cardiopulmonary disease. Electronically Signed   By: Franchot Gallo M.D.   On: 03/23/2016 20:27   Ct Head Wo Contrast  Result Date: 03/23/2016 CLINICAL DATA:  Altered mental status EXAM: CT HEAD WITHOUT CONTRAST TECHNIQUE: Contiguous axial images were obtained from the base of the skull through the vertex without intravenous contrast. COMPARISON:  CT head 05/22/2006 FINDINGS: Brain: Progressive atrophy since the prior study. Chronic microvascular ischemic change in the white matter also mildly progressive. 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 Negative for acute infarct.  Negative for hemorrhage or mass. Vascular: No hyperdense vessel or unexpected calcification. Skull: Negative Sinuses/Orbits: Negative Other: None IMPRESSION: Atrophy and chronic microvascular ischemia have progressed since 2008. No acute intracranial abnormality. Electronically Signed   By: Franchot Gallo M.D.   On: 03/23/2016 20:29   Ct Abdomen Pelvis W Contrast  Result Date: 03/24/2016 CLINICAL DATA:  Sepsis, fever, abdominal pain. EXAM: CT ABDOMEN AND PELVIS WITH CONTRAST TECHNIQUE: Multidetector CT imaging of the  abdomen and pelvis was performed using the standard protocol following bolus administration of intravenous contrast. CONTRAST:  166mL ISOVUE-300 IOPAMIDOL (ISOVUE-300) INJECTION 61% COMPARISON:  CT 01/24/2014 FINDINGS: Lower  chest: Reticulonodular opacities in the visualized right lung. Mild fissural thickening. Breathing motion artifact limits assessment. Moderate hiatal hernia Hepatobiliary: Cyst near the gallbladder fossa. Small hepatic granulomas. Gallbladder physiologically distended, no calcified stone. No biliary dilatation. Pancreas: Motion artifact limits assessment. Portions of the pancreatic body and tail extended through hiatal hernia. Spleen: Small hypodensities in the mid upper spleen with adjacent contour deformity Ing, unchanged from prior CT. Adrenals/Urinary Tract: No adrenal nodule. No hydronephrosis or perinephric edema. Motion artifact limits detailed assessment. Symmetric excretion on delayed phase imaging. Urinary bladder is distended, no wall thickening. Stomach/Bowel: Moderate hiatal hernia. No bowel wall thickening or inflammation. Motion artifact through the mid abdomen. Moderate stool burden throughout. There is distal colonic diverticulosis without acute diverticulitis. There is pelvic floor descent. The appendix is not visualized. Vascular/Lymphatic: Aortic atherosclerosis and tortuosity. No aneurysm. No definite adenopathy, motion limits evaluation. Reproductive: Uterus is not seen, may be surgically absent or atrophic. Other: No evidence of free air free fluid. Musculoskeletal: Scoliotic curvature in the spine. Posterior fusion hardware L1 through the sacrum. There are no acute or suspicious osseous abnormalities. IMPRESSION: 1. No acute finding in the abdomen/pelvis. 2. Reticulonodular opacities in the right lung, may be infectious or inflammatory. Aspiration not excluded. 3. Chronic findings include colonic diverticulosis without diverticulitis, chronic splenic deformities, hiatal hernia and atherosclerosis. Electronically Signed   By: Jeb Levering M.D.   On: 03/24/2016 00:36     Medical Consultants:    None.  Anti-Infectives:   Vancomycin and cefepime.  Subjective:    [name]  she continues to [employer] and groan sounds more awake keratotic conversation.  Objective:    Vitals:   03/24/16 0334 03/24/16 0600 03/24/16 0700 03/24/16 0800  BP:  (!) 163/63 140/67 (!) 165/61  Pulse:  (!) 105 (!) 110 (!) 111  Resp:  16 19 15   Temp: 97.6 F (36.4 C)  99.9 F (37.7 C) 99.9 F (37.7 C)  TempSrc: Oral   Core (Comment)  SpO2:  94% 95% 93%  Weight: 64.5 kg (142 lb 3.2 oz)     Height: 5\' 4"  (1.626 m)       Intake/Output Summary (Last 24 hours) at 03/24/16 0829 Last data filed at 03/24/16 0730  Gross per 24 hour  Intake               50 ml  Output              500 ml  Net             -450 ml   Filed Weights   03/23/16 2257 03/24/16 0334  Weight: 63.5 kg (140 lb) 64.5 kg (142 lb 3.2 oz)    Exam: General exam: In no acute distress, cachectic Respiratory system: Good air movement and clear to auscultation. Cardiovascular system: S1 & S2 heard, RRR. No JVD. Gastrointestinal system: Abdomen is nondistended, soft and nontender.  Central nervous system: Alert and oriented. No focal neurological deficits. Extremities: No pedal edema. Skin: No rashes, lesions or ulcers Psychiatry: Judgement and insight appear normal. Mood & affect appropriate.    Data Reviewed:    Labs: Basic Metabolic Panel:  Recent Labs Lab 03/23/16 2043 03/24/16 0506  NA 132* 133*  K 4.3 3.5  CL 97* 103  CO2 22 23  GLUCOSE 153* 131*  BUN  19 12  CREATININE 0.98 0.64  CALCIUM 9.6 8.2*  MG  --  1.8  PHOS  --  2.6   GFR Estimated Creatinine Clearance: 47.6 mL/min (by C-G formula based on SCr of 0.64 mg/dL). Liver Function Tests:  Recent Labs Lab 03/23/16 2043 03/24/16 0506  AST 43* 51*  ALT 15 17  ALKPHOS 47 36*  BILITOT 0.3 0.4  PROT 7.9 6.3*  ALBUMIN 4.3 3.4*   No results for input(s): LIPASE, AMYLASE in the last 168 hours. No results for input(s): AMMONIA in the last 168 hours. Coagulation profile  Recent Labs Lab 03/24/16 0506  INR 1.14    CBC:  Recent  Labs Lab 03/23/16 2043 03/24/16 0506  WBC 23.6* 11.4*  NEUTROABS 20.3*  --   HGB 12.0 9.6*  HCT 39.3 30.4*  MCV 79.6 79.2  PLT 643* 521*   Cardiac Enzymes:  Recent Labs Lab 03/24/16 0506  TROPONINI 0.04*   BNP (last 3 results) No results for input(s): PROBNP in the last 8760 hours. CBG:  Recent Labs Lab 03/24/16 0425  GLUCAP 139*   D-Dimer: No results for input(s): DDIMER in the last 72 hours. Hgb A1c: No results for input(s): HGBA1C in the last 72 hours. Lipid Profile: No results for input(s): CHOL, HDL, LDLCALC, TRIG, CHOLHDL, LDLDIRECT in the last 72 hours. Thyroid function studies:  Recent Labs  03/24/16 0506  TSH 1.380   Anemia work up: No results for input(s): VITAMINB12, FOLATE, FERRITIN, TIBC, IRON, RETICCTPCT in the last 72 hours. Sepsis Labs:  Recent Labs Lab 03/23/16 2043 03/23/16 2055 03/23/16 2309 03/24/16 0506 03/24/16 0642  PROCALCITON  --   --   --  0.43  --   WBC 23.6*  --   --  11.4*  --   LATICACIDVEN  --  4.74* 3.83* 1.5 1.2   Microbiology Recent Results (from the past 240 hour(s))  MRSA PCR Screening     Status: None   Collection Time: 03/24/16  4:00 AM  Result Value Ref Range Status   MRSA by PCR NEGATIVE NEGATIVE Final    Comment:        The GeneXpert MRSA Assay (FDA approved for NASAL specimens only), is one component of a comprehensive MRSA colonization surveillance program. It is not intended to diagnose MRSA infection nor to guide or monitor treatment for MRSA infections.      Medications:   . aspirin EC  81 mg Oral QHS  . insulin aspart  0-9 Units Subcutaneous Q4H  . iopamidol      . metoprolol  25 mg Oral BID  . piperacillin-tazobactam (ZOSYN)  IV  3.375 g Intravenous Q8H  . sodium chloride      . sodium chloride flush  3 mL Intravenous Q12H  . vancomycin  1,000 mg Intravenous Q24H   Continuous Infusions: . sodium chloride Stopped (03/24/16 0730)    Time spent: 25 min   LOS: 0 days   Charlynne Cousins  [facility] Pager [phone]  *Please refer to [email or web address], password [email or web address] to get updated schedule on who will round on this patient, as hospitalists switch teams weekly. If 7PM-7AM, please contact night-coverage at [email or web address], password [email or web address] for any overnight needs.  03/24/2016, 8:29 AM

## 2016-03-24 NOTE — Progress Notes (Signed)
Patient from transferred from ICU. Alert and oriented x 3. Vital signs obtained. Oriented to room and environment. Left antecubital IV site swollen on arrival, ICU nurse notified, site d/c'd. Placed comfortably in bed. Will continue to monitor.

## 2016-03-24 NOTE — Evaluation (Signed)
Clinical/Bedside Swallow Evaluation Patient Details  Name: [name] MRN: [id number] Date of Birth: 03-Oct-1932  Today's Date: 03/24/2016 Time: SLP Start Time (ACUTE ONLY): 0855 SLP Stop Time (ACUTE ONLY): 0925 SLP Time Calculation (min) (ACUTE ONLY): 30 min  Past Medical History:  Past Medical History:  Diagnosis Date  . Anemia 10-05-12   hgb-9.0 on 09-14-12  . Arm vein blood clot 10-05-12   Rt. arm '12  . Arthritis 10-05-12   degenerative joint disease,scoliosis spine  . Dysrhythmia 10-05-12   tachycardia -tx. Lopressor  . GERD (gastroesophageal reflux disease)   . H/O esophagitis   . H/O hiatal hernia   . Tachycardia   . Transfusion history 10-05-12   4 units blood '12   Past Surgical History:  Past Surgical History:  Procedure Laterality Date  . ABDOMINAL HYSTERECTOMY    . APPENDECTOMY     child  . BACK SURGERY  10-05-12   x5-last fusion with plates  . BREAST ENHANCEMENT SURGERY    . CATARACT EXTRACTION, BILATERAL    . COLONOSCOPY WITH PROPOFOL N/A 10/23/2012   Procedure: COLONOSCOPY WITH PROPOFOL;  Surgeon: Lear Ng, MD;  Location: [facility];  Service: Endoscopy;  Laterality: N/A;  . HOT HEMOSTASIS N/A 10/23/2012   Procedure: HOT HEMOSTASIS (ARGON PLASMA COAGULATION/BICAP);  Surgeon: Lear Ng, MD;  Location: [facility];  Service: Endoscopy;  Laterality: N/A;   HPI:  82 yo female adm to [facility] with AMS.  PMH + for autoimmune disease, arthritis, Cdif, anemia, GERD, esophageal ulcer, HH, chronic pain - takes narcotics.  Pt with ? overuse of opiods prior to admission.  CXR negative. CT head negative.  Pt lives alone and has a caregiver come once a week.     Assessment / Plan / Recommendation Clinical Impression  Pt with interesting craniofacial presentation - No uvula located despite many observation attempts.  Pt unaware of having surgery in the past or being born without uvula but denies having nasal loss of food/drink that would indicate  velopharyngeal incompetence. Please see CN notes re: other findings.    Initially pt demonstrated frequent coughing/throat clearing after first few boluses of Ensure.  She denied difficulties = and after first few boluses, pt with no further indications of airway deficits.  Suspect pt had retained secretions that cleared with intake.   SLP further observed pt consume few boluses of water, applesauce and crackers with no apparent deficits.  She does have minimal mandibular ROM which she attributes to autoimmune disease- ? Arthritis contribution.     SLP recommends to continue dys3/thin diet to accommodate poor dentition, mandibular ROM.  Educated pt to precautions and further advised need for dental management in future.    In addition, educated pt to prior UGI findings from 2014 and esophageal precautions.  Pt denies knowledge of UGI results, however advised she possible didn't recall them to which she concurred.  She also denies reflux symptoms  - however advised she follow precautions.  As all education completed, no follow up indicated.      Aspiration Risk  Mild aspiration risk    Diet Recommendation Dysphagia 3 (Mech soft);Thin liquid   Liquid Administration via: Cup;Straw Medication Administration:  (1/2d if large, give with room temperature Ensure) Supervision: Patient able to self feed Compensations: Slow rate;Small sips/bites Postural Changes: Seated upright at 90 degrees;Remain upright for at least 30 minutes after po intake    Other  Recommendations Oral Care Recommendations: Oral care BID   Follow up Recommendations None  Frequency and Duration            Prognosis        Swallow Study   General Date of Onset: 03/24/16 HPI: 82 yo female adm to [facility] with AMS.  PMH + for autoimmune disease, arthritis, Cdif, anemia, GERD, esophageal ulcer, HH, chronic pain - takes narcotics.  Pt with ? overuse of opiods prior to admission.  CXR negative. CT head negative.  Pt lives  alone and has a caregiver come once a week.   Type of Study: Bedside Swallow Evaluation Diet Prior to this Study: Dysphagia 3 (soft);Thin liquids Temperature Spikes Noted: No Respiratory Status: Room air History of Recent Intubation: No Behavior/Cognition: Alert;Cooperative;Other (Comment) (paraphasia noted) Oral Cavity Assessment: Within Functional Limits Oral Care Completed by SLP: No Oral Cavity - Dentition: Poor condition (pt states she needs to have her teeth removed due to autoimmune disease) Vision: Functional for self-feeding Self-Feeding Abilities: Able to feed self Patient Positioning: Upright in bed Baseline Vocal Quality: Normal Volitional Cough: Strong Volitional Swallow: Able to elicit    Oral/Motor/Sensory Function Overall Oral Motor/Sensory Function:  ( ) Facial ROM:  (? some facial asymmetry- ? decr tissue on left vs right) Facial Strength: Other (Comment) (reduced overall) Lingual ROM: Other (Comment) (reduced protrusion- only to anterior labia region) Lingual Symmetry: Abnormal symmetry left (? less tissue on left vs right) Velum:  (bilateral palatal elevation,  did not note uvula despite multiple observation attempts) Mandible: Other (Comment) (reduced ROM)   Ice Chips Ice chips: Within functional limits Presentation: Spoon   Thin Liquid Thin Liquid: Impaired Presentation: Cup;Straw Oral Phase Impairments: Reduced labial seal Other Comments: initially difficulty with producing labial seal; inhaling through nose in attempts to form suction on straw- pt reports she was trying to "find the straw"    Nectar Thick Nectar Thick Liquid: Impaired Pharyngeal Phase Impairments: Multiple swallows;Throat Clearing - Immediate;Cough - Immediate Other Comments: this ceased after initial intake, suspect secretion retention cleared with intake and thus no further episodes noted   Honey Thick Honey Thick Liquid: Not tested   Puree Puree: Within functional limits Presentation:  Self Fed;Spoon   Solid   GO   Solid: Within functional limits Presentation: [place], [place], [place] [facility] SLP [phone]

## 2016-03-24 NOTE — H&P (Signed)
[name] [id number] DOB: 07-07-1932 DOA: 03/23/2016     PCP: [name], MD   Outpatient Specialists: [facility]  Patient coming from:   home Lives alone,  Chief Complaint: confusion  HPI: [name] is a 82 y.o. female with medical history significant of chronic back pain  C. difficile, anemia, recurrent tachycardia takes Lopressor, GERD, esophageal ulcer,autoimmunedisease,  Presented with worsening confusion and cough caretaker has called 911 on their arrival she had pinpoint pupils heart rate up to 130s CBG 293 satting 78% on room air she was put on nonrebreather satting 99% the caregiver was concerned if she has taken too much of her pain medicines. Patient only reports that she is feeling tired but no having any pain. ER provided discuss case with son lives in [place] but was knowledgeable about patient's condition. States that she occasionally has abdominal pain in the past had history of C. difficile he haven't heard of recent diarrhea  Patient's caregiver states that they, please once a week and patient has meals delivered to her in regular basis  Family denied hx of CAD  IN ER:  Temp (24hrs), Avg:96.9 F (36.1 C), Min:96.5 F (35.8 C), Max:97.2 F (36.2 C)      Patient is noted to meet sepsis criteria show lactic acid 4.74 tachycardia To 120s white blood cell count 23 examination is hypoxic Currently heart rate down to 101 BP 153/66 and initially hypothermic down to 96.5 Sodium 132 cr 0.98   Lactic acid currently improved to 3.83 UA unremarkable ABG 7.451/36.4   CT abdomen pelvis nonacute this reticulonodular opacities of the right lung aspiration on included  Patient was noted to have 500 mL in bladder having trouble emptying with distended bladder Following Medications were ordered in ER: Medications  iopamidol (ISOVUE-300) 61 % injection (not administered)  sodium chloride 0.9 % injection (not administered)  sodium chloride 0.9 % bolus 1,000 mL (0  mLs Intravenous Stopped 03/23/16 2247)  sodium chloride 0.9 % bolus 1,000 mL (0 mLs Intravenous Stopped 03/23/16 2247)    And  sodium chloride 0.9 % bolus 1,000 mL (1,000 mLs Intravenous New Bag/Given 03/23/16 2225)  vancomycin (VANCOCIN) IVPB 1000 mg/200 mL premix (1,000 mg Intravenous New Bag/Given 03/23/16 2246)  piperacillin-tazobactam (ZOSYN) IVPB 3.375 g (0 g Intravenous Stopped 03/23/16 2246)  oxyCODONE (Oxy IR/ROXICODONE) immediate release tablet 5 mg (5 mg Oral Given 03/23/16 2223)  iopamidol (ISOVUE-300) 61 % injection 100 mL (100 mLs Intravenous Contrast Given 03/23/16 2351)     Hospitalist was called for admission for Sepsis  Review of Systems:    Pertinent positives include:confusion  Constitutional:  No weight loss, night sweats, Fevers, chills, fatigue, weight loss  HEENT:  No headaches, Difficulty swallowing,Tooth/dental problems,Sore throat,  No sneezing, itching, ear ache, nasal congestion, post nasal drip,  Cardio-vascular:  No chest pain, Orthopnea, PND, anasarca, dizziness, palpitations.no Bilateral lower extremity swelling  GI:  No heartburn, indigestion, abdominal pain, nausea, vomiting, diarrhea, change in bowel habits, loss of appetite, melena, blood in stool, hematemesis Resp:  no shortness of breath at rest. No dyspnea on exertion, No excess mucus, no productive cough, No non-productive cough, No coughing up of blood.No change in color of mucus.No wheezing. Skin:  no rash or lesions. No jaundice GU:  no dysuria, change in color of urine, no urgency or frequency. No straining to urinate.  No flank pain.  Musculoskeletal:  No joint pain or no joint swelling. No decreased range of motion. No back pain.  Psych:  No change in mood or affect. No depression or anxiety. No memory loss.  Neuro: no localizing neurological complaints, no tingling, no weakness, no double vision, no gait abnormality, no slurred speech, no confusion  As per HPI otherwise 10 point review  of systems negative.   Past Medical History: Past Medical History:  Diagnosis Date  . Anemia 10-05-12   hgb-9.0 on 09-14-12  . Arm vein blood clot 10-05-12   Rt. arm '12  . Arthritis 10-05-12   degenerative joint disease,scoliosis spine  . Dysrhythmia 10-05-12   tachycardia -tx. Lopressor  . GERD (gastroesophageal reflux disease)   . H/O esophagitis   . H/O hiatal hernia   . Tachycardia   . Transfusion history 10-05-12   4 units blood '12   Past Surgical History:  Procedure Laterality Date  . ABDOMINAL HYSTERECTOMY    . APPENDECTOMY     child  . BACK SURGERY  10-05-12   x5-last fusion with plates  . BREAST ENHANCEMENT SURGERY    . CATARACT EXTRACTION, BILATERAL    . COLONOSCOPY WITH PROPOFOL N/A 10/23/2012   Procedure: COLONOSCOPY WITH PROPOFOL;  Surgeon: Lear Ng, MD;  Location: [facility];  Service: Endoscopy;  Laterality: N/A;  . HOT HEMOSTASIS N/A 10/23/2012   Procedure: HOT HEMOSTASIS (ARGON PLASMA COAGULATION/BICAP);  Surgeon: Lear Ng, MD;  Location: [facility];  Service: Endoscopy;  Laterality: N/A;     Social History:  Ambulatory  walker       reports that she has never smoked. She has never used smokeless tobacco. She reports that she does not drink alcohol or use drugs.  Allergies:   Allergies  Allergen Reactions  . Ciprofloxacin Rash    burning       Family History:  Family History  Problem Relation Age of Onset  . Cancer Mother   . Cancer Father   . Diabetes Sister   . Stroke Neg Hx   . CAD Neg Hx     Medications: Prior to Admission medications   Medication Sig Start Date End Date Taking? Authorizing Provider  aspirin 81 MG tablet Take 81 mg by mouth at bedtime.    Yes Historical Provider, MD  ENSURE PLUS (ENSURE PLUS) LIQD Take 237 mLs by mouth 2 (two) times daily.   Yes Historical Provider, MD  metoprolol (LOPRESSOR) 50 MG tablet Take 50 mg by mouth 2 (two) times daily.   Yes Historical Provider, MD  Multiple Vitamin  (MULTIVITAMIN WITH MINERALS) TABS tablet Take 1 tablet by mouth daily.   Yes Historical Provider, MD  oxyCODONE (OXYCONTIN) 40 MG 12 hr tablet Take 40 mg by mouth every 8 (eight) hours.   Yes Historical Provider, MD  Oxycodone-Acetaminophen (PERCOCET PO) Take 1 tablet by mouth every 6 (six) hours as needed (breakthrough pain).   Yes Historical Provider, MD  pantoprazole (PROTONIX) 40 MG tablet Take 40 mg by mouth 2 (two) times daily.   Yes Historical Provider, MD  benzonatate (TESSALON) 100 MG capsule Take 1 capsule (100 mg total) by mouth 3 (three) times daily as needed for cough. Patient not taking: Reported on 03/23/2016 04/10/15   Harvel Quale, MD  metroNIDAZOLE (FLAGYL) 500 MG tablet Take 1 tablet (500 mg total) by mouth every 8 (eight) hours. Patient not taking: Reported on 03/23/2016 01/27/14   Janece Canterbury, MD  ondansetron (ZOFRAN) 4 MG tablet Take 1 tablet (4 mg total) by mouth every 4 (four) hours as needed for nausea or vomiting. Patient not taking: Reported on 03/23/2016  01/27/14   Janece Canterbury, MD  oxyCODONE (ROXICODONE) 15 MG immediate release tablet Take 1 tablet (15 mg total) by mouth every 6 (six) hours as needed for moderate pain or severe pain. Patient not taking: Reported on 03/23/2016 01/27/14   Janece Canterbury, MD    Physical Exam: Patient Vitals for the past 24 hrs:  BP Temp Temp src Pulse Resp SpO2 Height Weight  03/23/16 2330 153/66 - - - 13 - - -  03/23/16 2257 - - - - - - 5\' 3"  (1.6 m) 63.5 kg (140 lb)  03/23/16 2236 181/86 - - 108 25 98 % - -  03/23/16 2230 186/85 - - 110 23 100 % - -  03/23/16 2200 164/78 - - - 21 - - -  03/23/16 2130 151/82 - - 114 16 97 % - -  03/23/16 2122 135/81 (!) 96.5 F (35.8 C) Axillary 120 22 95 % - -  03/23/16 1928 - - - - - 98 % - -  03/23/16 1920 128/68 97.2 F (36.2 C) Axillary (!) 127 18 98 % - -    1. General:  in No Acute distress 2. Psychological: Alert and  Oriented to self 3. Head/ENT:   Dry Mucous Membranes                           Head Non traumatic, neck supple                           Poor Dentition 4. SKIN: decreased Skin turgor,  Skin clean Dry and intact no rash 5. Heart: Regular rate and rhythm no Murmur, Rub or gallop 6. Lungs:no wheezes or crackles   7. Abdomen: Soft, non-tender, Non distended 8. Lower extremities: no clubbing, cyanosis, or edema 9. Neurologically Grossly intact, moving all 4 extremities equally  10. MSK: Normal range of motion   body mass index is 24.8 kg/m.  Labs on Admission:   Labs on Admission: I have personally reviewed following labs and imaging studies  CBC:  Recent Labs Lab 03/23/16 2043  WBC 23.6*  NEUTROABS 20.3*  HGB 12.0  HCT 39.3  MCV 79.6  PLT [id number]*   Basic Metabolic Panel:  Recent Labs Lab 03/23/16 2043  NA 132*  K 4.3  CL 97*  CO2 22  GLUCOSE 153*  BUN 19  CREATININE 0.98  CALCIUM 9.6   GFR: Estimated Creatinine Clearance: 40.4 mL/min (by C-G formula based on SCr of 0.98 mg/dL). Liver Function Tests:  Recent Labs Lab 03/23/16 2043  AST 43*  ALT 15  ALKPHOS 47  BILITOT 0.3  PROT 7.9  ALBUMIN 4.3   No results for input(s): LIPASE, AMYLASE in the last 168 hours. No results for input(s): AMMONIA in the last 168 hours. Coagulation Profile: No results for input(s): INR, PROTIME in the last 168 hours. Cardiac Enzymes: No results for input(s): CKTOTAL, CKMB, CKMBINDEX, TROPONINI in the last 168 hours. BNP (last 3 results) No results for input(s): PROBNP in the last 8760 hours. HbA1C: No results for input(s): HGBA1C in the last 72 hours. CBG: No results for input(s): GLUCAP in the last 168 hours. Lipid Profile: No results for input(s): CHOL, HDL, LDLCALC, TRIG, CHOLHDL, LDLDIRECT in the last 72 hours. Thyroid Function Tests: No results for input(s): TSH, T4TOTAL, FREET4, T3FREE, THYROIDAB in the last 72 hours. Anemia Panel: No results for input(s): VITAMINB12, FOLATE, FERRITIN, TIBC, IRON, RETICCTPCT in the last 72  hours. Urine analysis:    Component Value Date/Time   COLORURINE YELLOW 03/23/2016 2210   APPEARANCEUR CLEAR 03/23/2016 2210   LABSPEC 1.009 03/23/2016 2210   PHURINE 7.0 03/23/2016 2210   GLUCOSEU NEGATIVE 03/23/2016 2210   HGBUR NEGATIVE 03/23/2016 2210   BILIRUBINUR NEGATIVE 03/23/2016 2210   KETONESUR NEGATIVE 03/23/2016 2210   PROTEINUR NEGATIVE 03/23/2016 2210   UROBILINOGEN 0.2 01/26/2014 2329   NITRITE NEGATIVE 03/23/2016 2210   LEUKOCYTESUR NEGATIVE 03/23/2016 2210   Sepsis Labs: @LABRCNTIP (procalcitonin:4,lacticidven:4) )No results found for this or any previous visit (from the past 240 hour(s)).    UA  no evidence of UTI    No results found for: HGBA1C  Estimated Creatinine Clearance: 40.4 mL/min (by C-G formula based on SCr of 0.98 mg/dL).  BNP (last 3 results) No results for input(s): PROBNP in the last 8760 hours.   ECG REPORT  Independently reviewed Rate: 118  Rhythm: sinus tachycardia ST&T Change: No acute ischemic changes  QTC 431   Filed Weights   03/23/16 2257  Weight: 63.5 kg (140 lb)     Cultures:    Component Value Date/Time   SDES URINE, CLEAN CATCH 01/25/2014 0420   SPECREQUEST NONE 01/25/2014 0420   CULT  01/25/2014 0420    Multiple bacterial morphotypes present, none predominant. Suggest appropriate recollection if clinically indicated. Performed at [facility] 01/26/2014 FINAL 01/25/2014 0420     Radiological Exams on Admission: Dg Chest 2 View  Result Date: 03/23/2016 CLINICAL DATA:  Altered mental status EXAM: CHEST  2 VIEW COMPARISON:  04/10/2015 FINDINGS: Elevated right hemidiaphragm unchanged. Right lower lobe atelectasis unchanged. Negative for pneumonia. Negative for heart failure. Moderate hiatal hernia with air-fluid level. IMPRESSION: No active cardiopulmonary disease. Electronically Signed   By: Franchot Gallo M.D.   On: 03/23/2016 20:27   Ct Head Wo Contrast  Result Date: 03/23/2016 CLINICAL  DATA:  Altered mental status EXAM: CT HEAD WITHOUT CONTRAST TECHNIQUE: Contiguous axial images were obtained from the base of the skull through the vertex without intravenous contrast. COMPARISON:  CT head 05/22/2006 FINDINGS: Brain: Progressive atrophy since the prior study. Chronic microvascular ischemic change in the white matter also mildly progressive. 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 Negative for acute infarct.  Negative for hemorrhage or mass. Vascular: No hyperdense vessel or unexpected calcification. Skull: Negative Sinuses/Orbits: Negative Other: None IMPRESSION: Atrophy and chronic microvascular ischemia have progressed since 2008. No acute intracranial abnormality. Electronically Signed   By: Franchot Gallo M.D.   On: 03/23/2016 20:29   Ct Abdomen Pelvis W Contrast  Result Date: 03/24/2016 CLINICAL DATA:  Sepsis, fever, abdominal pain. EXAM: CT ABDOMEN AND PELVIS WITH CONTRAST TECHNIQUE: Multidetector CT imaging of the abdomen and pelvis was performed using the standard protocol following bolus administration of intravenous contrast. CONTRAST:  145mL ISOVUE-300 IOPAMIDOL (ISOVUE-300) INJECTION 61% COMPARISON:  CT 01/24/2014 FINDINGS: Lower chest: Reticulonodular opacities in the visualized right lung. Mild fissural thickening. Breathing motion artifact limits assessment. Moderate hiatal hernia Hepatobiliary: Cyst near the gallbladder fossa. Small hepatic granulomas. Gallbladder physiologically distended, no calcified stone. No biliary dilatation. Pancreas: Motion artifact limits assessment. Portions of the pancreatic body and tail extended through hiatal hernia. Spleen: Small hypodensities in the mid upper spleen with adjacent contour deformity Ing, unchanged from prior CT. Adrenals/Urinary Tract: No adrenal nodule. No hydronephrosis or perinephric edema. Motion artifact limits detailed assessment. Symmetric excretion on delayed phase imaging. Urinary bladder is distended, no wall  thickening. Stomach/Bowel: Moderate hiatal hernia. No bowel  wall thickening or inflammation. Motion artifact through the mid abdomen. Moderate stool burden throughout. There is distal colonic diverticulosis without acute diverticulitis. There is pelvic floor descent. The appendix is not visualized. Vascular/Lymphatic: Aortic atherosclerosis and tortuosity. No aneurysm. No definite adenopathy, motion limits evaluation. Reproductive: Uterus is not seen, may be surgically absent or atrophic. Other: No evidence of free air free fluid. Musculoskeletal: Scoliotic curvature in the spine. Posterior fusion hardware L1 through the sacrum. There are no acute or suspicious osseous abnormalities. IMPRESSION: 1. No acute finding in the abdomen/pelvis. 2. Reticulonodular opacities in the right lung, may be infectious or inflammatory. Aspiration not excluded. 3. Chronic findings include colonic diverticulosis without diverticulitis, chronic splenic deformities, hiatal hernia and atherosclerosis. Electronically Signed   By: Jeb Levering M.D.   On: 03/24/2016 00:36    Chart has been reviewed    Assessment/Plan  82 y.o. female with medical history significant of chronic back pain  C. difficile, anemia, recurrent tachycardia takes Lopressor, GERD, esophageal ulcer,autoimmune disease admitted with Sepsis  Present on Admission: . Sepsis ([place]) - Admit per Sepsis protocol likely source being aspiration pneumonia,   - rehydrate with 31ml/kg  - initiate broad spectrum antibiotics Vancomycin and Zosyn  -  obtain blood cultures  - Obtain serial lactic acid  - Obtain procalcitonin level  - Admit and monitor vital signs closely  -  PCCM has been consulted  Sepsis - Repeat Assessment  Performed at:    1:45 AM  Vitals     Blood pressure 153/66, pulse 108, temperature (!) 96.5 F (35.8 C), temperature source Axillary, resp. rate 13, height 5\' 3"  (1.6 m), weight 63.5 kg (140 lb), SpO2 98 %.  Heart:     Regular rate  and rhythm  Lungs:    CTA  Capillary Refill:   <2 sec  Peripheral Pulse:   Radial pulse palpable  Skin:     Normal Color   . Essential hypertension - patient has chronic recurrent tachycardia continue metoprolol with holding parameters and hold other medications   . Aspiration pneumonia ([place]) cover broadly with Zosyn most likely in the setting of chronic or. Use. SLP consult Urinary Retention [address] [place] patient will likely need to have decreased dose of her opioids as this can contribute to urinary retention Acute encephalopathy in the setting of polypharmacy will need to readjust home medications patient lives alone will probably benefit from PT OT assessment prior to discharge patient may need placement CT scan of the head unremarkable patient's mental status is somewhat improving after rehydration continue to monitor coronary no focal neurological deficits Other plan as per orders.  DVT prophylaxis:   Lovenox     Code Status:  FULL CODE as per family   Family Communication:   Family not  at  Bedside  plan of care was discussed with  Son on the phone Disposition Plan:    May need placement                        Consults called: PCCM   Admission status:   inpatient    Level of care  SDU      I have spent a total of 56 min on this admission   Yuliya Nova 03/24/2016, 2:13 AM    [facility]  Pager [phone]   after 2 AM please page floor coverage PA If 7AM-7PM, please contact the day team taking care of the patient  [email or web address]  Password [email or web address]

## 2016-03-24 NOTE — Progress Notes (Signed)
Pharmacy Antibiotic Note  [name] is a 82 y.o. female admitted on 03/23/2016 with sepsis.  Pharmacy has been consulted for Vancomycin and Zosyn dosing.  Plan: Vancomycin 1gm IV every 24 hours.  Goal trough 15-20 mcg/mL. Zosyn 3.375g IV q8h (4 hour infusion).  Height: 5\' 4"  (162.6 cm) Weight: 142 lb 3.2 oz (64.5 kg) IBW/kg (Calculated) : 54.7  Temp (24hrs), Avg:97.5 F (36.4 C), Min:96.5 F (35.8 C), Max:98 F (36.7 C)   Recent Labs Lab 03/23/16 2043 03/23/16 2055 03/23/16 2309  WBC 23.6*  --   --   CREATININE 0.98  --   --   LATICACIDVEN  --  4.74* 3.83*    Estimated Creatinine Clearance: 38.9 mL/min (by C-G formula based on SCr of 0.98 mg/dL).    Allergies  Allergen Reactions  . Ciprofloxacin Rash    burning    Antimicrobials this admission: Vancomycin 03/23/2016 >> Zosyn 03/23/2016 >>   Dose adjustments this admission: -  Microbiology results: pending  Thank you for allowing pharmacy to be a part of this patient's care.  Nani Skillern Crowford 03/24/2016 4:04 AM

## 2016-03-24 NOTE — ED Notes (Signed)
Pt in ct 

## 2016-03-24 NOTE — Progress Notes (Signed)
MD aware that order for IVF has expired. No new order.

## 2016-03-25 DIAGNOSIS — E43 Unspecified severe protein-calorie malnutrition: Secondary | ICD-10-CM

## 2016-03-25 DIAGNOSIS — R739 Hyperglycemia, unspecified: Secondary | ICD-10-CM

## 2016-03-25 DIAGNOSIS — I10 Essential (primary) hypertension: Secondary | ICD-10-CM

## 2016-03-25 DIAGNOSIS — D72829 Elevated white blood cell count, unspecified: Secondary | ICD-10-CM

## 2016-03-25 DIAGNOSIS — J690 Pneumonitis due to inhalation of food and vomit: Secondary | ICD-10-CM

## 2016-03-25 LAB — GLUCOSE, CAPILLARY
GLUCOSE-CAPILLARY: 115 mg/dL — AB (ref 65–99)
GLUCOSE-CAPILLARY: 97 mg/dL (ref 65–99)
Glucose-Capillary: 101 mg/dL — ABNORMAL HIGH (ref 65–99)
Glucose-Capillary: 104 mg/dL — ABNORMAL HIGH (ref 65–99)
Glucose-Capillary: 107 mg/dL — ABNORMAL HIGH (ref 65–99)
Glucose-Capillary: 113 mg/dL — ABNORMAL HIGH (ref 65–99)
Glucose-Capillary: 117 mg/dL — ABNORMAL HIGH (ref 65–99)

## 2016-03-25 LAB — HEMOGLOBIN A1C
HEMOGLOBIN A1C: 6 % — AB (ref 4.8–5.6)
MEAN PLASMA GLUCOSE: 126 mg/dL

## 2016-03-25 MED ORDER — AMOXICILLIN-POT CLAVULANATE 875-125 MG PO TABS
1.0000 | ORAL_TABLET | Freq: Two times a day (BID) | ORAL | Status: DC
  Administered 2016-03-25 – 2016-03-27 (×4): 1 via ORAL
  Filled 2016-03-25 (×4): qty 1

## 2016-03-25 NOTE — Progress Notes (Signed)
Pharmacy Antibiotic Note  [name] is a 82 y.o. female admitted on 03/23/2016 with sepsis.  Pharmacy has been consulted for Vancomycin and Zosyn dosing.  12/8: D2 Abx, CrCl ~50 CG/N using rounded SCr 1 for age Abx doses remain ok Pt tx out of ICU  Plan: Continue Vanc 1g IV q24h Continue Cefepime 1g IV q12h F/u cultures, VT at Css as warranted, renal function, clinical course  Height: 5\' 4"  (162.6 cm) Weight: 142 lb 3.2 oz (64.5 kg) IBW/kg (Calculated) : 54.7  Temp (24hrs), Avg:98.1 F (36.7 C), Min:97.4 F (36.3 C), Max:99.3 F (37.4 C)   Recent Labs Lab 03/23/16 2043 03/23/16 2055 03/23/16 2309 03/24/16 0506 03/24/16 0642  WBC 23.6*  --   --  11.4*  --   CREATININE 0.98  --   --  0.64  --   LATICACIDVEN  --  4.74* 3.83* 1.5 1.2    Estimated Creatinine Clearance: 47.6 mL/min (by C-G formula based on SCr of 0.64 mg/dL).    Allergies  Allergen Reactions  . Ciprofloxacin Rash    burning    Antimicrobials this admission: Vancomycin 03/23/2016 >> Zosyn 03/23/2016 >>   Dose adjustments this admission: -  Microbiology results: pending  Thank you for allowing pharmacy to be a part of this patient's care.  Ralene Bathe E 03/25/2016 8:25 AM

## 2016-03-25 NOTE — Progress Notes (Signed)
[facility] PROGRESS NOTE    Progress Note  [name]  [id number] DOB: 10/22/1932 DOA: 03/23/2016 PCP: [name], MD     Brief Narrative:   [name] is an 82 y.o. female past medical history of chronic back pain, C. difficile, recurrent tachycardia esophageal ulcer is brought in by her caregiver for worsening confusion and cough on arrival to the ED she had pinpoint pupils with a heart rate of 1 3778% on room air she was put on nonrebreather came up to 99.  Assessment/Plan:  Sepsis in the setting of possible aspiration pneumonia: De-escalate antibiotic coverage to Augmentin and monitor overnight. Her blood pressure regimen stable. Blood cultures are pending. Pt pending    Acute encephalopathy: Question due to polypharmacy. Now resolved, after holding narcotics for several hours. She does not awaken to carry on a conversation complaining about pain Physical therapy evaluation is pending.  Leukocytosis: Likely due to infectious etiology.  Hyponatremia: Likely due to hypovolemia she has been fluid resuscitated now improved.  Essential hypertension Continue hold anti-hypertensive medications blood pressure stable now. Continue metoprolol.  Hyperglycemia: Likely due to stressors, now well controlled. Continue sliding scale insulin.  Severe protein caloric malnutrition  DVT prophylaxis: lovenox Family Communication:none Disposition Plan/Barrier to D/C: home in am Code Status:     Code Status Orders        Start     Ordered   03/24/16 0410  Full code  Continuous     03/24/16 0409    Code Status History    Date Active Date Inactive Code Status Order ID Comments User Context   01/25/2014  1:03 AM 01/27/2014  6:15 PM Full Code [id number]  Theodis Blaze, MD Inpatient    Advance Directive Documentation   Flowsheet Row Most Recent Value  Type of Advance Directive  Healthcare Power of Attorney, Living will  Pre-existing out of facility DNR order  (yellow form or pink MOST form)  No data  "MOST" Form in Place?  No data        IV Access:    Peripheral IV   Procedures and diagnostic studies:   Dg Chest 2 View  Result Date: 03/23/2016 CLINICAL DATA:  Altered mental status EXAM: CHEST  2 VIEW COMPARISON:  04/10/2015 FINDINGS: Elevated right hemidiaphragm unchanged. Right lower lobe atelectasis unchanged. Negative for pneumonia. Negative for heart failure. Moderate hiatal hernia with air-fluid level. IMPRESSION: No active cardiopulmonary disease. Electronically Signed   By: Franchot Gallo M.D.   On: 03/23/2016 20:27   Ct Head Wo Contrast  Result Date: 03/23/2016 CLINICAL DATA:  Altered mental status EXAM: CT HEAD WITHOUT CONTRAST TECHNIQUE: Contiguous axial images were obtained from the base of the skull through the vertex without intravenous contrast. COMPARISON:  CT head 05/22/2006 FINDINGS: Brain: Progressive atrophy since the prior study. Chronic microvascular ischemic change in the white matter also mildly progressive. 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 Negative for acute infarct.  Negative for hemorrhage or mass. Vascular: No hyperdense vessel or unexpected calcification. Skull: Negative Sinuses/Orbits: Negative Other: None IMPRESSION: Atrophy and chronic microvascular ischemia have progressed since 2008. No acute intracranial abnormality. Electronically Signed   By: Franchot Gallo M.D.   On: 03/23/2016 20:29   Ct Abdomen Pelvis W Contrast  Result Date: 03/24/2016 CLINICAL DATA:  Sepsis, fever, abdominal pain. EXAM: CT ABDOMEN AND PELVIS WITH CONTRAST TECHNIQUE: Multidetector CT imaging of the abdomen and pelvis was performed using the standard protocol following bolus administration of intravenous contrast. CONTRAST:  126mL ISOVUE-300 IOPAMIDOL (ISOVUE-300) INJECTION 61% COMPARISON:  CT 01/24/2014 FINDINGS: Lower chest: Reticulonodular opacities in the visualized right lung. Mild fissural thickening. Breathing  motion artifact limits assessment. Moderate hiatal hernia Hepatobiliary: Cyst near the gallbladder fossa. Small hepatic granulomas. Gallbladder physiologically distended, no calcified stone. No biliary dilatation. Pancreas: Motion artifact limits assessment. Portions of the pancreatic body and tail extended through hiatal hernia. Spleen: Small hypodensities in the mid upper spleen with adjacent contour deformity Ing, unchanged from prior CT. Adrenals/Urinary Tract: No adrenal nodule. No hydronephrosis or perinephric edema. Motion artifact limits detailed assessment. Symmetric excretion on delayed phase imaging. Urinary bladder is distended, no wall thickening. Stomach/Bowel: Moderate hiatal hernia. No bowel wall thickening or inflammation. Motion artifact through the mid abdomen. Moderate stool burden throughout. There is distal colonic diverticulosis without acute diverticulitis. There is pelvic floor descent. The appendix is not visualized. Vascular/Lymphatic: Aortic atherosclerosis and tortuosity. No aneurysm. No definite adenopathy, motion limits evaluation. Reproductive: Uterus is not seen, may be surgically absent or atrophic. Other: No evidence of free air free fluid. Musculoskeletal: Scoliotic curvature in the spine. Posterior fusion hardware L1 through the sacrum. There are no acute or suspicious osseous abnormalities. IMPRESSION: 1. No acute finding in the abdomen/pelvis. 2. Reticulonodular opacities in the right lung, may be infectious or inflammatory. Aspiration not excluded. 3. Chronic findings include colonic diverticulosis without diverticulitis, chronic splenic deformities, hiatal hernia and atherosclerosis. Electronically Signed   By: Jeb Levering M.D.   On: 03/24/2016 00:36     Medical Consultants:    None.  Anti-Infectives:   Vancomycin and cefepime.  Subjective:    [name] she feels better  Objective:    Vitals:   03/24/16 1704 03/24/16 2038 03/25/16 0548 03/25/16  1006  BP: (!) 133/54 (!) 152/68 (!) 162/80 (!) 138/92  Pulse: 90 92 85 93  Resp:  17 17   Temp:  98.6 F (37 C) 97.7 F (36.5 C)   TempSrc:  Oral Oral   SpO2: 95% 95% 97%   Weight:      Height:        Intake/Output Summary (Last 24 hours) at 03/25/16 1241 Last data filed at 03/25/16 1030  Gross per 24 hour  Intake           601.67 ml  Output             1275 ml  Net          -673.33 ml   Filed Weights   03/23/16 2257 03/24/16 0334  Weight: 63.5 kg (140 lb) 64.5 kg (142 lb 3.2 oz)    Exam: General exam: In no acute distress, cachectic Respiratory system: Good air movement and clear to auscultation. Cardiovascular system: S1 & S2 heard, RRR. No JVD. Gastrointestinal system: Abdomen is nondistended, soft and nontender.  Extremities: No pedal edema. Skin: No rashes, lesions or ulcers Psychiatry: Judgement and insight appear normal. Mood & affect appropriate.    Data Reviewed:    Labs: Basic Metabolic Panel:  Recent Labs Lab 03/23/16 2043 03/24/16 0506  NA 132* 133*  K 4.3 3.5  CL 97* 103  CO2 22 23  GLUCOSE 153* 131*  BUN 19 12  CREATININE 0.98 0.64  CALCIUM 9.6 8.2*  MG  --  1.8  PHOS  --  2.6   GFR Estimated Creatinine Clearance: 47.6 mL/min (by C-G formula based on SCr of 0.64 mg/dL). Liver Function Tests:  Recent Labs Lab 03/23/16 2043 03/24/16 0506  AST 43* 51*  ALT  15 17  ALKPHOS 47 36*  BILITOT 0.3 0.4  PROT 7.9 6.3*  ALBUMIN 4.3 3.4*   No results for input(s): LIPASE, AMYLASE in the last 168 hours. No results for input(s): AMMONIA in the last 168 hours. Coagulation profile  Recent Labs Lab 03/24/16 0506  INR 1.14    CBC:  Recent Labs Lab 03/23/16 2043 03/24/16 0506  WBC 23.6* 11.4*  NEUTROABS 20.3*  --   HGB 12.0 9.6*  HCT 39.3 30.4*  MCV 79.6 79.2  PLT 643* 521*   Cardiac Enzymes:  Recent Labs Lab 03/24/16 0506 03/24/16 1132 03/24/16 1653  TROPONINI 0.04* 0.03* <0.03   BNP (last 3 results) No results for  input(s): PROBNP in the last 8760 hours. CBG:  Recent Labs Lab 03/24/16 2000 03/25/16 0018 03/25/16 0422 03/25/16 0741 03/25/16 1205  GLUCAP 102* 113* 101* 107* 115*   D-Dimer: No results for input(s): DDIMER in the last 72 hours. Hgb A1c:  Recent Labs  03/24/16 0506  HGBA1C 6.0*   Lipid Profile: No results for input(s): CHOL, HDL, LDLCALC, TRIG, CHOLHDL, LDLDIRECT in the last 72 hours. Thyroid function studies:  Recent Labs  03/24/16 0506  TSH 1.380   Anemia work up: No results for input(s): VITAMINB12, FOLATE, FERRITIN, TIBC, IRON, RETICCTPCT in the last 72 hours. Sepsis Labs:  Recent Labs Lab 03/23/16 2043 03/23/16 2055 03/23/16 2309 03/24/16 0506 03/24/16 0642  PROCALCITON  --   --   --  0.43  --   WBC 23.6*  --   --  11.4*  --   LATICACIDVEN  --  4.74* 3.83* 1.5 1.2   Microbiology Recent Results (from the past 240 hour(s))  MRSA PCR Screening     Status: None   Collection Time: 03/24/16  4:00 AM  Result Value Ref Range Status   MRSA by PCR NEGATIVE NEGATIVE Final    Comment:        The GeneXpert MRSA Assay (FDA approved for NASAL specimens only), is one component of a comprehensive MRSA colonization surveillance program. It is not intended to diagnose MRSA infection nor to guide or monitor treatment for MRSA infections.   Culture, blood (routine x 2) Call MD if unable to obtain prior to antibiotics being given     Status: None (Preliminary result)   Collection Time: 03/24/16  5:00 AM  Result Value Ref Range Status   Specimen Description BLOOD RIGHT ARM  Final   Special Requests BOTTLES DRAWN AEROBIC ONLY 5CC  Final   Culture   Final    NO GROWTH 1 DAY Performed at [facility]    Report Status PENDING  Incomplete  Culture, blood (routine x 2) Call MD if unable to obtain prior to antibiotics being given     Status: None (Preliminary result)   Collection Time: 03/24/16  5:06 AM  Result Value Ref Range Status   Specimen Description  BLOOD RIGHT HAND  Final   Special Requests BOTTLES DRAWN AEROBIC AND ANAEROBIC 5CC  Final   Culture   Final    NO GROWTH 1 DAY Performed at [facility]    Report Status PENDING  Incomplete     Medications:   . aspirin EC  81 mg Oral QHS  . ceFEPime (MAXIPIME) IV  1 g Intravenous Q12H  . insulin aspart  0-9 Units Subcutaneous Q4H  . metoprolol  25 mg Oral BID  . oxyCODONE  40 mg Oral Q12H  . sodium chloride flush  3 mL Intravenous Q12H  .  vancomycin  1,000 mg Intravenous Q24H   Continuous Infusions:   Time spent: 25 min   LOS: 1 day   Charlynne Cousins  [facility] Pager [phone]  *Please refer to [email or web address], password [email or web address] to get updated schedule on who will round on this patient, as hospitalists switch teams weekly. If 7PM-7AM, please contact night-coverage at [email or web address], password [email or web address] for any overnight needs.  03/25/2016, 12:41 PM

## 2016-03-26 DIAGNOSIS — A419 Sepsis, unspecified organism: Principal | ICD-10-CM

## 2016-03-26 LAB — GLUCOSE, CAPILLARY
GLUCOSE-CAPILLARY: 127 mg/dL — AB (ref 65–99)
Glucose-Capillary: 105 mg/dL — ABNORMAL HIGH (ref 65–99)
Glucose-Capillary: 131 mg/dL — ABNORMAL HIGH (ref 65–99)
Glucose-Capillary: 128 mg/dL — ABNORMAL HIGH (ref 65–99)
Glucose-Capillary: 114 mg/dL — ABNORMAL HIGH (ref 65–99)

## 2016-03-26 MED ORDER — AMOXICILLIN-POT CLAVULANATE 875-125 MG PO TABS: 1.0000 | ORAL_TABLET | Freq: Two times a day (BID) | ORAL | 0 refills | Status: DC

## 2016-03-26 MED ORDER — BISACODYL 10 MG RE SUPP
10.0000 mg | Freq: Once | RECTAL | Status: AC
  Administered 2016-03-26: 10 mg via RECTAL
  Filled 2016-03-26: qty 1

## 2016-03-26 MED ORDER — POLYETHYLENE GLYCOL 3350 17 G PO PACK
17.0000 g | PACK | Freq: Two times a day (BID) | ORAL | Status: DC
  Administered 2016-03-26 (×2): 17 g via ORAL
  Filled 2016-03-26 (×3): qty 1

## 2016-03-26 MED ORDER — OXYCODONE HCL 40 MG PO TB12: 40.0000 mg | ORAL_TABLET | Freq: Three times a day (TID) | ORAL | 0 refills | Status: DC

## 2016-03-26 MED ORDER — POLYETHYLENE GLYCOL 3350 17 G PO PACK: 17.0000 g | PACK | Freq: Two times a day (BID) | ORAL | 0 refills | Status: DC

## 2016-03-26 NOTE — Evaluation (Signed)
Physical Therapy Evaluation Patient Details Name: [name] MRN: [id number] DOB: 1933/03/12 Today's Date: 03/26/2016   History of Present Illness  Pt admitted through ED with dx of AMS, weakness and sepsis.  Pt with hx of back surg x5 and chronic pain  Clinical Impression  Pt admitted as above and presenting with functional mobility limitations 2* generalized weakness, balance deficits, and poor endurance.  Pt would greatly benefit from follow up rehab at SNF level to maximize IND and saftey    Follow Up Recommendations SNF    Equipment Recommendations  None recommended by PT    Recommendations for Other Services OT consult     Precautions / Restrictions Precautions Precautions: Fall Restrictions Weight Bearing Restrictions: No      Mobility  Bed Mobility Overal bed mobility: Needs Assistance Bed Mobility: Supine to Sit     Supine to sit: Min assist     General bed mobility comments: Increased time and cues for sequence  Transfers Overall transfer level: Needs assistance Equipment used: Rolling walker (2 wheeled) Transfers: Sit to/from Stand Sit to Stand: Min assist;+2 physical assistance;+2 safety/equipment;From elevated surface         General transfer comment: cues for LE management, basic saftey awareness, and use of UEs to self assist.  Physical assist to bring wt up and fwd and to balance in standing  Ambulation/Gait Ambulation/Gait assistance: Min assist;+2 physical assistance;+2 safety/equipment Ambulation Distance (Feet): 2 Feet Assistive device: Rolling walker (2 wheeled) Gait Pattern/deviations: Step-to pattern;Decreased step length - right;Decreased step length - left;Shuffle;Trunk flexed Gait velocity: decr Gait velocity interpretation: Below normal speed for age/gender General Gait Details: cues for sequence, posture and position from RW.  Assist x 2 for saftey 2* buckling at knees  Stairs            Wheelchair Mobility    Modified  Rankin (Stroke Patients Only)       Balance Overall balance assessment: Needs assistance Sitting-balance support: Feet supported;No upper extremity supported Sitting balance-Leahy Scale: Good     Standing balance support: Bilateral upper extremity supported Standing balance-Leahy Scale: Poor                               Pertinent Vitals/Pain Pain Assessment: Faces Faces Pain Scale: Hurts little more Pain Location: back Pain Descriptors / Indicators: Aching Pain Intervention(s): Limited activity within patient's tolerance;Monitored during session;Premedicated before session    Home Living Family/patient expects to be discharged to:: Skilled nursing facility Living Arrangements: Alone                    Prior Function Level of Independence: Independent with assistive device(s);Needs assistance   Gait / Transfers Assistance Needed: cane vs RW  ADL's / Homemaking Assistance Needed: Assist of caregiver 3x/wk        Hand Dominance        Extremity/Trunk Assessment   Upper Extremity Assessment: Generalized weakness           Lower Extremity Assessment: Generalized weakness      Cervical / Trunk Assessment: Kyphotic  Communication   Communication: No difficulties  Cognition Arousal/Alertness: Awake/alert Behavior During Therapy: WFL for tasks assessed/performed Overall Cognitive Status: Within Functional Limits for tasks assessed                      General Comments      Exercises     Assessment/Plan  PT Assessment Patient needs continued PT services  PT Problem List Decreased strength;Decreased range of motion;Decreased activity tolerance;Decreased balance;Decreased mobility;Decreased knowledge of use of DME;Pain          PT Treatment Interventions DME instruction;Gait training;Stair training;Functional mobility training;Therapeutic activities;Therapeutic exercise;Patient/family education    PT Goals (Current goals  can be found in the Care Plan section)  Acute Rehab PT Goals Patient Stated Goal: [facility] PT Goal Formulation: With patient Time For Goal Achievement: 04/09/16 Potential to Achieve Goals: Fair    Frequency Min 3X/week   Barriers to discharge        Co-evaluation               End of Session Equipment Utilized During Treatment: Gait belt Activity Tolerance: Patient limited by fatigue Patient left: in chair;with call bell/phone within reach;with chair alarm set Nurse Communication: Mobility status         Time: 1230-1250 PT Time Calculation (min) (ACUTE ONLY): 20 min   Charges:   PT Evaluation $PT Eval Moderate Complexity: 1 Procedure     PT G Codes:        Kynesha Guerin 2016-04-12, 1:11 PM

## 2016-03-26 NOTE — Progress Notes (Addendum)
Placed call to Social work x 3 for disposition of bed availability at [facility] for [employer].Voicemail left. No return call. MD made aware.

## 2016-03-26 NOTE — Discharge Summary (Addendum)
Physician Discharge Summary  [name] [id number] DOB: 03/07/1933 DOA: 03/23/2016  PCP: [name], MD  Admit date: 03/23/2016 Discharge date: 03/26/2016  Admitted From: home Disposition:  SNF  Recommendations for Outpatient Follow-up:  1. Follow up with PCP in 1-2 weeks 2. Please obtain BMP/CBC in one week. 3. She will go [facility]. 4. She will need to be evaluate as an outpatient with a voiding trial in 2-3 days. Discontinue foley in 2 day. If no successful voiding will need a follow up appointment with urology as an outpatient.   Home Health:No Equipment/Devices:None  Discharge Condition:stable CODE STATUS:full Diet recommendation: Heart Healthy  Brief/Interim Summary: 82 y.o. female with medical history significant of chronic back pain  C. difficile, anemia, recurrent tachycardia takes Lopressor, GERD, esophageal ulcer,autoimmunedisease,  Presented with worsening confusion and cough caretaker has called 911 on their arrival she had pinpoint pupils heart rate up to 130s CBG 293 satting 78%  Discharge Diagnoses:  Active Problems:   Leukocytosis   Essential hypertension   Sepsis ([place])   Aspiration pneumonia ([place])   Urinary retention   Hyperglycemia   Protein-calorie malnutrition, severe (HCC)  Sepsis in the setting of possible aspiration pneumonia: Started on admission on vanc and cefepime, once afebrile De-escalate antibiotic coverage to Augmentin and monitor overnight. Blood cultures were negative till date. Pt evaluated the patient who rec SNF.    Acute encephalopathy: Question due to polypharmacy. Now resolved.  Leukocytosis: Likely due to infectious etiology.  Hyponatremia: Likely due to hypovolemia, resolved with IV fluid hydration.  Essential hypertension Resume home medications.  Hyperglycemia: Likely due to stressors.  Severe protein caloric malnutrition   Discharge Instructions  Discharge Instructions    Diet - low  sodium heart healthy    Complete by:  As directed    Increase activity slowly    Complete by:  As directed        Medication List    STOP taking these medications   ENSURE PLUS Liqd   oxyCODONE-acetaminophen 10-325 MG tablet Commonly known as:  PERCOCET     TAKE these medications   amoxicillin-clavulanate 875-125 MG tablet Commonly known as:  AUGMENTIN Take 1 tablet by mouth every 12 (twelve) hours.   aspirin 81 MG tablet Take 81 mg by mouth at bedtime.   metoprolol 50 MG tablet Commonly known as:  LOPRESSOR Take 50 mg by mouth 2 (two) times daily.   multivitamin with minerals Tabs tablet Take 1 tablet by mouth daily.   oxyCODONE 40 MG 12 hr tablet Commonly known as:  OXYCONTIN Take 1 tablet (40 mg total) by mouth every 8 (eight) hours.   pantoprazole 40 MG tablet Commonly known as:  PROTONIX Take 40 mg by mouth 2 (two) times daily.   polyethylene glycol packet Commonly known as:  MIRALAX / GLYCOLAX Take 17 g by mouth 2 (two) times daily.       Allergies  Allergen Reactions  . Ciprofloxacin Rash    burning    Consultations:  None   Procedures/Studies: Dg Chest 2 View  Result Date: 03/23/2016 CLINICAL DATA:  Altered mental status EXAM: CHEST  2 VIEW COMPARISON:  04/10/2015 FINDINGS: Elevated right hemidiaphragm unchanged. Right lower lobe atelectasis unchanged. Negative for pneumonia. Negative for heart failure. Moderate hiatal hernia with air-fluid level. IMPRESSION: No active cardiopulmonary disease. Electronically Signed   By: Franchot Gallo M.D.   On: 03/23/2016 20:27   Ct Head Wo Contrast  Result Date: 03/23/2016 CLINICAL DATA:  Altered mental status EXAM:  CT HEAD WITHOUT CONTRAST TECHNIQUE: Contiguous axial images were obtained from the base of the skull through the vertex without intravenous contrast. COMPARISON:  CT head 05/22/2006 FINDINGS: Brain: Progressive atrophy since the prior study. Chronic microvascular ischemic change in the white  matter also mildly progressive. 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 Negative for acute infarct.  Negative for hemorrhage or mass. Vascular: No hyperdense vessel or unexpected calcification. Skull: Negative Sinuses/Orbits: Negative Other: None IMPRESSION: Atrophy and chronic microvascular ischemia have progressed since 2008. No acute intracranial abnormality. Electronically Signed   By: Franchot Gallo M.D.   On: 03/23/2016 20:29   Ct Abdomen Pelvis W Contrast  Result Date: 03/24/2016 CLINICAL DATA:  Sepsis, fever, abdominal pain. EXAM: CT ABDOMEN AND PELVIS WITH CONTRAST TECHNIQUE: Multidetector CT imaging of the abdomen and pelvis was performed using the standard protocol following bolus administration of intravenous contrast. CONTRAST:  173mL ISOVUE-300 IOPAMIDOL (ISOVUE-300) INJECTION 61% COMPARISON:  CT 01/24/2014 FINDINGS: Lower chest: Reticulonodular opacities in the visualized right lung. Mild fissural thickening. Breathing motion artifact limits assessment. Moderate hiatal hernia Hepatobiliary: Cyst near the gallbladder fossa. Small hepatic granulomas. Gallbladder physiologically distended, no calcified stone. No biliary dilatation. Pancreas: Motion artifact limits assessment. Portions of the pancreatic body and tail extended through hiatal hernia. Spleen: Small hypodensities in the mid upper spleen with adjacent contour deformity Ing, unchanged from prior CT. Adrenals/Urinary Tract: No adrenal nodule. No hydronephrosis or perinephric edema. Motion artifact limits detailed assessment. Symmetric excretion on delayed phase imaging. Urinary bladder is distended, no wall thickening. Stomach/Bowel: Moderate hiatal hernia. No bowel wall thickening or inflammation. Motion artifact through the mid abdomen. Moderate stool burden throughout. There is distal colonic diverticulosis without acute diverticulitis. There is pelvic floor descent. The appendix is not visualized. Vascular/Lymphatic: Aortic  atherosclerosis and tortuosity. No aneurysm. No definite adenopathy, motion limits evaluation. Reproductive: Uterus is not seen, may be surgically absent or atrophic. Other: No evidence of free air free fluid. Musculoskeletal: Scoliotic curvature in the spine. Posterior fusion hardware L1 through the sacrum. There are no acute or suspicious osseous abnormalities. IMPRESSION: 1. No acute finding in the abdomen/pelvis. 2. Reticulonodular opacities in the right lung, may be infectious or inflammatory. Aspiration not excluded. 3. Chronic findings include colonic diverticulosis without diverticulitis, chronic splenic deformities, hiatal hernia and atherosclerosis. Electronically Signed   By: Jeb Levering M.D.   On: 03/24/2016 00:36       Subjective: No complains  Discharge Exam: Vitals:   03/25/16 2009 03/26/16 0410  BP: (!) 151/74 (!) 148/73  Pulse: 85 80  Resp: 18 16  Temp: 98.6 F (37 C) 97.3 F (36.3 C)   Vitals:   03/25/16 1006 03/25/16 1419 03/25/16 2009 03/26/16 0410  BP: (!) 138/92 (!) 140/59 (!) 151/74 (!) 148/73  Pulse: 93 89 85 80  Resp:  17 18 16   Temp:  98.7 F (37.1 C) 98.6 F (37 C) 97.3 F (36.3 C)  TempSrc:  Oral Axillary Axillary  SpO2:  100%  95%  Weight:      Height:        General: Pt is alert, awake, not in acute distress Cardiovascular: RRR, S1/S2 +, no rubs, no gallops Respiratory: CTA bilaterally, no wheezing, no rhonchi Abdominal: Soft, NT, ND, bowel sounds + Extremities: no edema, no cyanosis    The results of significant diagnostics from this hospitalization (including imaging, microbiology, ancillary and laboratory) are listed below for reference.     Microbiology: Recent Results (from the past 240 hour(s))  MRSA PCR  Screening     Status: None   Collection Time: 03/24/16  4:00 AM  Result Value Ref Range Status   MRSA by PCR NEGATIVE NEGATIVE Final    Comment:        The GeneXpert MRSA Assay (FDA approved for NASAL specimens only), is  one component of a comprehensive MRSA colonization surveillance program. It is not intended to diagnose MRSA infection nor to guide or monitor treatment for MRSA infections.   Culture, blood (routine x 2) Call MD if unable to obtain prior to antibiotics being given     Status: None (Preliminary result)   Collection Time: 03/24/16  5:00 AM  Result Value Ref Range Status   Specimen Description BLOOD RIGHT ARM  Final   Special Requests BOTTLES DRAWN AEROBIC ONLY 5CC  Final   Culture   Final    NO GROWTH 1 DAY Performed at [facility]    Report Status PENDING  Incomplete  Culture, blood (routine x 2) Call MD if unable to obtain prior to antibiotics being given     Status: None (Preliminary result)   Collection Time: 03/24/16  5:06 AM  Result Value Ref Range Status   Specimen Description BLOOD RIGHT HAND  Final   Special Requests BOTTLES DRAWN AEROBIC AND ANAEROBIC 5CC  Final   Culture   Final    NO GROWTH 1 DAY Performed at [facility]    Report Status PENDING  Incomplete     Labs: BNP (last 3 results) No results for input(s): BNP in the last 8760 hours. Basic Metabolic Panel:  Recent Labs Lab 03/23/16 2043 03/24/16 0506  NA 132* 133*  K 4.3 3.5  CL 97* 103  CO2 22 23  GLUCOSE 153* 131*  BUN 19 12  CREATININE 0.98 0.64  CALCIUM 9.6 8.2*  MG  --  1.8  PHOS  --  2.6   Liver Function Tests:  Recent Labs Lab 03/23/16 2043 03/24/16 0506  AST 43* 51*  ALT 15 17  ALKPHOS 47 36*  BILITOT 0.3 0.4  PROT 7.9 6.3*  ALBUMIN 4.3 3.4*   No results for input(s): LIPASE, AMYLASE in the last 168 hours. No results for input(s): AMMONIA in the last 168 hours. CBC:  Recent Labs Lab 03/23/16 2043 03/24/16 0506  WBC 23.6* 11.4*  NEUTROABS 20.3*  --   HGB 12.0 9.6*  HCT 39.3 30.4*  MCV 79.6 79.2  PLT 643* 521*   Cardiac Enzymes:  Recent Labs Lab 03/24/16 0506 03/24/16 1132 03/24/16 1653  TROPONINI 0.04* 0.03* <0.03   BNP: Invalid input(s):  POCBNP CBG:  Recent Labs Lab 03/25/16 2004 03/25/16 2339 03/26/16 0412 03/26/16 0743 03/26/16 1205  GLUCAP 97 117* 105* 127* 131*   D-Dimer No results for input(s): DDIMER in the last 72 hours. Hgb A1c  Recent Labs  03/24/16 0506  HGBA1C 6.0*   Lipid Profile No results for input(s): CHOL, HDL, LDLCALC, TRIG, CHOLHDL, LDLDIRECT in the last 72 hours. Thyroid function studies  Recent Labs  03/24/16 0506  TSH 1.380   Anemia work up No results for input(s): VITAMINB12, FOLATE, FERRITIN, TIBC, IRON, RETICCTPCT in the last 72 hours. Urinalysis    Component Value Date/Time   COLORURINE YELLOW 03/23/2016 2210   APPEARANCEUR CLEAR 03/23/2016 2210   LABSPEC 1.009 03/23/2016 2210   PHURINE 7.0 03/23/2016 2210   GLUCOSEU NEGATIVE 03/23/2016 2210   HGBUR NEGATIVE 03/23/2016 2210   BILIRUBINUR NEGATIVE 03/23/2016 2210   Section 03/23/2016 2210   PROTEINUR  NEGATIVE 03/23/2016 2210   UROBILINOGEN 0.2 01/26/2014 2329   NITRITE NEGATIVE 03/23/2016 2210   LEUKOCYTESUR NEGATIVE 03/23/2016 2210   Sepsis Labs Invalid input(s): PROCALCITONIN,  WBC,  LACTICIDVEN Microbiology Recent Results (from the past 240 hour(s))  MRSA PCR Screening     Status: None   Collection Time: 03/24/16  4:00 AM  Result Value Ref Range Status   MRSA by PCR NEGATIVE NEGATIVE Final    Comment:        The GeneXpert MRSA Assay (FDA approved for NASAL specimens only), is one component of a comprehensive MRSA colonization surveillance program. It is not intended to diagnose MRSA infection nor to guide or monitor treatment for MRSA infections.   Culture, blood (routine x 2) Call MD if unable to obtain prior to antibiotics being given     Status: None (Preliminary result)   Collection Time: 03/24/16  5:00 AM  Result Value Ref Range Status   Specimen Description BLOOD RIGHT ARM  Final   Special Requests BOTTLES DRAWN AEROBIC ONLY 5CC  Final   Culture   Final    NO GROWTH 1 DAY Performed  at [facility]    Report Status PENDING  Incomplete  Culture, blood (routine x 2) Call MD if unable to obtain prior to antibiotics being given     Status: None (Preliminary result)   Collection Time: 03/24/16  5:06 AM  Result Value Ref Range Status   Specimen Description BLOOD RIGHT HAND  Final   Special Requests BOTTLES DRAWN AEROBIC AND ANAEROBIC 5CC  Final   Culture   Final    NO GROWTH 1 DAY Performed at [facility]    Report Status PENDING  Incomplete     Time coordinating discharge: Over 30 minutes  SIGNED:   Charlynne Cousins, MD  [facility] 03/26/2016, 12:09 PM Pager   If 7PM-7AM, please contact night-coverage [email or web address] Password [email or web address]

## 2016-03-27 LAB — GLUCOSE, CAPILLARY
GLUCOSE-CAPILLARY: 109 mg/dL — AB (ref 65–99)
Glucose-Capillary: 131 mg/dL — ABNORMAL HIGH (ref 65–99)
Glucose-Capillary: 139 mg/dL — ABNORMAL HIGH (ref 65–99)
Glucose-Capillary: 95 mg/dL (ref 65–99)

## 2016-03-27 MED ORDER — LIP MEDEX EX OINT
TOPICAL_OINTMENT | CUTANEOUS | Status: AC
  Administered 2016-03-27: 1
  Filled 2016-03-27: qty 7

## 2016-03-27 MED ORDER — ACETAMINOPHEN 325 MG PO TABS: 650.0000 mg | ORAL_TABLET | Freq: Four times a day (QID) | ORAL | Status: DC | PRN

## 2016-03-27 NOTE — Progress Notes (Addendum)
Called [facility] to give report and no one answered. Called again at 1640, no one answering. Ptar here to take pt, try calling report again no one answering.

## 2016-03-27 NOTE — Care Management Note (Signed)
Case Management Note  Patient Details  Name: [name] MRN: [id number] Date of Birth: 03/22/1933  Subjective/Objective:   AMS, weakness, sepsis               Action/Plan: Discharge Planning: AVS reviewed: Chart reviewed. CSW following for dc to SNF.    Expected Discharge Date:  03/27/2016            Expected Discharge Plan:  Skilled Nursing Facility  In-House Referral:  Clinical Social Work  Discharge planning Services  CM Consult  Post Acute Care Choice:  NA Choice offered to:  NA  DME Arranged:  N/A DME Agency:  NA  HH Arranged:  NA HH Agency:  NA  Status of Service:  Completed, signed off  If discussed at [employer] of Stay Meetings, dates discussed:    Additional Comments:  Erenest Rasher, RN 03/27/2016, 9:29 AM

## 2016-03-27 NOTE — NC FL2 (Signed)
[place] MEDICAID FL2 LEVEL OF CARE SCREENING TOOL     IDENTIFICATION  Patient Name: [name] Birthdate: 08/12/1932 Sex: female Admission Date (Current Location): 03/23/2016  [facility] and [place] Number:  Herbalist and Address:  [facility],  [address], [place]      Provider Number: [id number]  Attending Physician Name and Address:  Charlynne Cousins, MD  Relative Name and Phone Number:       Current Level of Care: Hospital Recommended Level of Care: [place] Prior Approval Number:    Date Approved/Denied:   PASRR Number: [id number]  Discharge Plan: SNF    Current Diagnoses: Patient Active Problem List   Diagnosis Date Noted  . Sepsis ([place]) 03/24/2016  . Aspiration pneumonia ([place]) 03/24/2016  . Urinary retention 03/24/2016  . Hyperglycemia 03/24/2016  . Protein-calorie malnutrition, severe ([place]) 03/24/2016  . Leukocytosis 01/27/2014  . Sinus tachycardia 01/27/2014  . Essential hypertension 01/27/2014  . Colitis 01/24/2014  . Iron deficiency anemia 10/23/2012  . Benign neoplasm of colon 10/23/2012    Orientation RESPIRATION BLADDER Height & Weight     Self, Time, Situation, Place  Normal Continent Weight: 142 lb 3.2 oz (64.5 kg) Height:  5\' 4"  (162.6 cm)  BEHAVIORAL SYMPTOMS/MOOD NEUROLOGICAL BOWEL NUTRITION STATUS      Continent    AMBULATORY STATUS COMMUNICATION OF NEEDS Skin   Limited Assist Verbally Normal                       Personal Care Assistance Level of Assistance  Bathing, Dressing Bathing Assistance: Maximum assistance   Dressing Assistance: Maximum assistance     Functional Limitations Info             SPECIAL CARE FACTORS FREQUENCY                       Contractures      Additional Factors Info  Code Status, Allergies Code Status Info: full code Allergies Info: Ciprofloxacin           Current Medications (03/27/2016):  This is the current  hospital active medication list Current Facility-Administered Medications  Medication Dose Route Frequency Provider Last Rate Last Dose  . amoxicillin-clavulanate (AUGMENTIN) 875-125 MG per tablet 1 tablet  1 tablet Oral Q12H Charlynne Cousins, MD   1 tablet at 03/26/16 2138  . aspirin EC tablet 81 mg  81 mg Oral QHS Toy Baker, MD   81 mg at 03/26/16 2137  . insulin aspart (novoLOG) injection 0-9 Units  0-9 Units Subcutaneous Q4H Toy Baker, MD   2 Units at 03/27/16 0134  . metoprolol tartrate (LOPRESSOR) tablet 25 mg  25 mg Oral BID Charlynne Cousins, MD   25 mg at 03/26/16 2138  . ondansetron (ZOFRAN) tablet 4 mg  4 mg Oral Q6H PRN Toy Baker, MD       Or  . ondansetron (ZOFRAN) injection 4 mg  4 mg Intravenous Q6H PRN Toy Baker, MD      . oxyCODONE (OXYCONTIN) 12 hr tablet 40 mg  40 mg Oral Q12H Charlynne Cousins, MD   40 mg at 03/26/16 2138  . oxyCODONE-acetaminophen (PERCOCET/ROXICET) 5-325 MG per tablet 1-2 tablet  1-2 tablet Oral Q6H PRN Charlynne Cousins, MD   2 tablet at 03/27/16 [phone]  . polyethylene glycol (MIRALAX / GLYCOLAX) packet 17 g  17 g Oral BID Charlynne Cousins, MD   [phone]  g at 03/26/16 2137  . sodium chloride flush (NS) 0.9 % injection 3 mL  3 mL Intravenous Q12H Toy Baker, MD   3 mL at 03/26/16 1010  . traMADol (ULTRAM) tablet 100 mg  100 mg Oral Q6H PRN Toy Baker, MD   100 mg at 03/24/16 0532     Discharge Medications: Please see discharge summary for a list of discharge medications.  Relevant Imaging Results:  Relevant Lab Results:   Additional Information [id number]  Carlean Jews, LCSW

## 2016-03-27 NOTE — Clinical Social Work Note (Signed)
LCSW sent patient information through the HUB.  MD needs to sign FL2.  LCSW will follow up with facilities to assess for bed availability.  Dede Query, LCSW [place] Worker - Weekend Coverage cell #: [phone]

## 2016-03-27 NOTE — Progress Notes (Signed)
[facility] PROGRESS NOTE    Progress Note  [name]  [id number] DOB: Mar 16, 1933 DOA: 03/23/2016 PCP: [name], MD     Brief Narrative:   [name] is an 82 y.o. female past medical history of chronic back pain, C. difficile, recurrent tachycardia esophageal ulcer is brought in by her caregiver for worsening confusion and cough on arrival to the ED she had pinpoint pupils with a heart rate of 1 3778% on room air she was put on nonrebreather came up to 99.  Assessment/Plan:  Sepsis in the setting of possible aspiration pneumonia: PT rec SNF, awaiting placement.  Acute encephalopathy: Leukocytosis: Hyponatremia: Essential hypertension Hyperglycemia: Severe protein caloric malnutrition No changes on other medications.   DVT prophylaxis: lovenox Family Communication:none Disposition Plan/Barrier to D/C: home in am Code Status:     Code Status Orders        Start     Ordered   03/24/16 0410  Full code  Continuous     03/24/16 0409    Code Status History    Date Active Date Inactive Code Status Order ID Comments User Context   01/25/2014  1:03 AM 01/27/2014  6:15 PM Full Code [id number]  Theodis Blaze, MD Inpatient    Advance Directive Documentation   Flowsheet Row Most Recent Value  Type of Advance Directive  Healthcare Power of Attorney, Living will  Pre-existing out of facility DNR order (yellow form or pink MOST form)  No data  "MOST" Form in Place?  No data        IV Access:    Peripheral IV   Procedures and diagnostic studies:   No results found.   Medical Consultants:    None.  Anti-Infectives:   Vancomycin and cefepime.  Subjective:    Dene Gentry she feels better  Objective:    Vitals:   03/26/16 1427 03/26/16 2031 03/27/16 0502 03/27/16 1022  BP: (!) 149/69 (!) 141/82 (!) 149/75 (!) 149/75  Pulse: (!) 104 96 100 100  Resp: 16 20 20    Temp: 98.8 F (37.1 C) 97.6 F (36.4 C) 98.4 F (36.9 C)     TempSrc: Oral Oral Oral   SpO2: 94% 96% 96%   Weight:      Height:        Intake/Output Summary (Last 24 hours) at 03/27/16 1121 Last data filed at 03/27/16 0500  Gross per 24 hour  Intake              480 ml  Output             1100 ml  Net             -620 ml   Filed Weights   03/23/16 2257 03/24/16 0334  Weight: 63.5 kg (140 lb) 64.5 kg (142 lb 3.2 oz)    Exam: General exam: In no acute distress, cachectic Respiratory system: Good air movement and clear to auscultation. Cardiovascular system: S1 & S2 heard, RRR. No JVD. Gastrointestinal system: Abdomen is nondistended, soft and nontender.  Extremities: No pedal edema. Skin: No rashes, lesions or ulcers   Data Reviewed:    Labs: Basic Metabolic Panel:  Recent Labs Lab 03/23/16 2043 03/24/16 0506  NA 132* 133*  K 4.3 3.5  CL 97* 103  CO2 22 23  GLUCOSE 153* 131*  BUN 19 12  CREATININE 0.98 0.64  CALCIUM 9.6 8.2*  MG  --  1.8  PHOS  --  2.6  GFR Estimated Creatinine Clearance: 47.6 mL/min (by C-G formula based on SCr of 0.64 mg/dL). Liver Function Tests:  Recent Labs Lab 03/23/16 2043 03/24/16 0506  AST 43* 51*  ALT 15 17  ALKPHOS 47 36*  BILITOT 0.3 0.4  PROT 7.9 6.3*  ALBUMIN 4.3 3.4*   No results for input(s): LIPASE, AMYLASE in the last 168 hours. No results for input(s): AMMONIA in the last 168 hours. Coagulation profile  Recent Labs Lab 03/24/16 0506  INR 1.14    CBC:  Recent Labs Lab 03/23/16 2043 03/24/16 0506  WBC 23.6* 11.4*  NEUTROABS 20.3*  --   HGB 12.0 9.6*  HCT 39.3 30.4*  MCV 79.6 79.2  PLT 643* 521*   Cardiac Enzymes:  Recent Labs Lab 03/24/16 0506 03/24/16 1132 03/24/16 1653  TROPONINI 0.04* 0.03* <0.03   BNP (last 3 results) No results for input(s): PROBNP in the last 8760 hours. CBG:  Recent Labs Lab 03/26/16 1629 03/26/16 1953 03/27/16 0042 03/27/16 0450 03/27/16 0730  GLUCAP 128* 114* 139* 109* 131*   D-Dimer: No results for input(s):  DDIMER in the last 72 hours. Hgb A1c: No results for input(s): HGBA1C in the last 72 hours. Lipid Profile: No results for input(s): CHOL, HDL, LDLCALC, TRIG, CHOLHDL, LDLDIRECT in the last 72 hours. Thyroid function studies: No results for input(s): TSH, T4TOTAL, T3FREE, THYROIDAB in the last 72 hours.  Invalid input(s): FREET3 Anemia work up: No results for input(s): VITAMINB12, FOLATE, FERRITIN, TIBC, IRON, RETICCTPCT in the last 72 hours. Sepsis Labs:  Recent Labs Lab 03/23/16 2043 03/23/16 2055 03/23/16 2309 03/24/16 0506 03/24/16 0642  PROCALCITON  --   --   --  0.43  --   WBC 23.6*  --   --  11.4*  --   LATICACIDVEN  --  4.74* 3.83* 1.5 1.2   Microbiology Recent Results (from the past 240 hour(s))  MRSA PCR Screening     Status: None   Collection Time: 03/24/16  4:00 AM  Result Value Ref Range Status   MRSA by PCR NEGATIVE NEGATIVE Final    Comment:        The GeneXpert MRSA Assay (FDA approved for NASAL specimens only), is one component of a comprehensive MRSA colonization surveillance program. It is not intended to diagnose MRSA infection nor to guide or monitor treatment for MRSA infections.   Culture, blood (routine x 2) Call MD if unable to obtain prior to antibiotics being given     Status: None (Preliminary result)   Collection Time: 03/24/16  5:00 AM  Result Value Ref Range Status   Specimen Description BLOOD RIGHT ARM  Final   Special Requests BOTTLES DRAWN AEROBIC ONLY 5CC  Final   Culture   Final    NO GROWTH 2 DAYS Performed at [facility]    Report Status PENDING  Incomplete  Culture, blood (routine x 2) Call MD if unable to obtain prior to antibiotics being given     Status: None (Preliminary result)   Collection Time: 03/24/16  5:06 AM  Result Value Ref Range Status   Specimen Description BLOOD RIGHT HAND  Final   Special Requests BOTTLES DRAWN AEROBIC AND ANAEROBIC 5CC  Final   Culture   Final    NO GROWTH 2 DAYS Performed at  [facility]    Report Status PENDING  Incomplete     Medications:   . amoxicillin-clavulanate  1 tablet Oral Q12H  . aspirin EC  81 mg Oral QHS  .  insulin aspart  0-9 Units Subcutaneous Q4H  . metoprolol  25 mg Oral BID  . oxyCODONE  40 mg Oral Q12H  . polyethylene glycol  17 g Oral BID  . sodium chloride flush  3 mL Intravenous Q12H   Continuous Infusions:   Time spent: 15 min   LOS: 3 days   Charlynne Cousins  [facility] Pager [phone]  *Please refer to [email or web address], password [email or web address] to get updated schedule on who will round on this patient, as hospitalists switch teams weekly. If 7PM-7AM, please contact night-coverage at [email or web address], password [email or web address] for any overnight needs.  03/27/2016, 11:21 AM

## 2016-03-27 NOTE — Progress Notes (Signed)
Foley 16 Fr. Inserted after cleaning properly. Pt tolerated well. Light yellow clear urine noted. 550 ml collected in foley bag. Emptied prior to leaving.

## 2016-03-27 NOTE — Clinical Social Work Note (Signed)
LCSW received call that patient was discharging today.  LCSW reviewed patient chart that reflected a discharge summary was done yesterday before PT evaluation had been completed.  LCSW will meet with patient and also send her information through the hub to obtain bed offers.  Notes in chart reflect patient had been living at home alone.  Dede Query, LCSW [place] Worker - Weekend Coverage cell #: [phone]

## 2016-03-27 NOTE — Clinical Social Work Note (Signed)
LCSW secured a bed for [facility] at [facility].  MD consulted to assess for transport.  Patient and her son provided with this information.  Scripts and packet left with RN.  Patent's foley taken out.  Patient will be sent via [facility] once she urinates.  Dede Query, LCSW [place] Worker - Weekend Coverage cell #: [phone]

## 2016-03-27 NOTE — Care Management Important Message (Signed)
Important Message  Patient Details  Name: [name] MRN: [id number] Date of Birth: 1933-01-11   Medicare Important Message Given:  Yes    Erenest Rasher, RN 03/27/2016, 9:57 AM

## 2016-03-27 NOTE — Clinical Social Work Placement (Signed)
   CLINICAL SOCIAL WORK PLACEMENT  NOTE  Date:  03/27/2016  Patient Details  Name: [name] MRN: [id number] Date of Birth: 1933-04-17  Clinical Social Work is seeking post-discharge placement for this patient at the [place] level of care (*CSW will initial, date and re-position this form in  chart as items are completed):  Yes   Patient/family provided with [facility] Work Department's list of facilities offering this level of care within the geographic area requested by the patient (or if unable, by the patient's family).  Yes   Patient/family informed of their freedom to choose among providers that offer the needed level of care, that participate in Medicare, Medicaid or managed care program needed by the patient, have an available bed and are willing to accept the patient.  Yes   Patient/family informed of [facility]'s ownership interest in [facility] and [facility], as well as of the fact that they are under no obligation to receive care at these facilities.  PASRR submitted to EDS on       PASRR number received on       Existing PASRR number confirmed on 03/27/16     FL2 transmitted to all facilities in geographic area requested by pt/family on 03/27/16     FL2 transmitted to all facilities within larger geographic area on       Patient informed that his/her managed care company has contracts with or will negotiate with certain facilities, including the following:        Yes   Patient/family informed of bed offers received.  [facility] bed at  ([facility])     Physician recommends and patient chooses bed at      Patient to be transferred to  ([facility]) on 03/27/16.  Patient to be transferred to facility by  (ambulance)     Patient family notified on 03/27/16 of transfer.  Name of family member notified:  son ronald/care taker Clarise Cruz     PHYSICIAN       Additional Comment:     _______________________________________________ Carlean Jews, [place] 03/27/2016, 2:37 PM

## 2016-03-28 ENCOUNTER — Emergency Department (HOSPITAL_COMMUNITY): Payer: Medicare Other

## 2016-03-28 ENCOUNTER — Emergency Department (HOSPITAL_COMMUNITY)
Admission: EM | Admit: 2016-03-28 | Discharge: 2016-03-28 | Disposition: A | Discharge status: Home / Self Care | Payer: Medicare Other | Attending: Emergency Medicine | Admitting: Emergency Medicine

## 2016-03-28 ENCOUNTER — Encounter (HOSPITAL_COMMUNITY): Payer: Self-pay

## 2016-03-28 DIAGNOSIS — I10 Essential (primary) hypertension: Secondary | ICD-10-CM | POA: Diagnosis not present

## 2016-03-28 DIAGNOSIS — R109 Unspecified abdominal pain: Secondary | ICD-10-CM

## 2016-03-28 DIAGNOSIS — R197 Diarrhea, unspecified: Secondary | ICD-10-CM | POA: Insufficient documentation

## 2016-03-28 DIAGNOSIS — Z79899 Other long term (current) drug therapy: Secondary | ICD-10-CM | POA: Insufficient documentation

## 2016-03-28 DIAGNOSIS — Z7982 Long term (current) use of aspirin: Secondary | ICD-10-CM | POA: Diagnosis not present

## 2016-03-28 DIAGNOSIS — R1084 Generalized abdominal pain: Secondary | ICD-10-CM | POA: Insufficient documentation

## 2016-03-28 DIAGNOSIS — R195 Other fecal abnormalities: Secondary | ICD-10-CM

## 2016-03-28 DIAGNOSIS — J690 Pneumonitis due to inhalation of food and vomit: Secondary | ICD-10-CM | POA: Diagnosis not present

## 2016-03-28 LAB — COMPREHENSIVE METABOLIC PANEL
ALBUMIN: 3.9 g/dL (ref 3.5–5.0)
ALT: 24 U/L (ref 14–54)
ANION GAP: 10 (ref 5–15)
AST: 29 U/L (ref 15–41)
Alkaline Phosphatase: 45 U/L (ref 38–126)
BUN: 13 mg/dL (ref 6–20)
CHLORIDE: 100 mmol/L — AB (ref 101–111)
CO2: 23 mmol/L (ref 22–32)
Calcium: 9.4 mg/dL (ref 8.9–10.3)
Creatinine, Ser: 0.64 mg/dL (ref 0.44–1.00)
GFR calc non Af Amer: >60 mL/min (ref >60)
GLUCOSE: 130 mg/dL — AB (ref 65–99)
Potassium: 4.5 mmol/L (ref 3.5–5.1)
SODIUM: 133 mmol/L — AB (ref 135–145)
Total Bilirubin: 0.6 mg/dL (ref 0.3–1.2)
Total Protein: 7.6 g/dL (ref 6.5–8.1)

## 2016-03-28 LAB — CBC WITH DIFFERENTIAL/PLATELET
BASOS PCT: 1 %
Basophils Absolute: 0.1 10*3/uL (ref 0.0–0.1)
EOS ABS: 0 10*3/uL (ref 0.0–0.7)
EOS PCT: 0 %
HCT: 37 % (ref 36.0–46.0)
Hemoglobin: 11.4 g/dL — ABNORMAL LOW (ref 12.0–15.0)
LYMPHS ABS: 1.5 10*3/uL (ref 0.7–4.0)
Lymphocytes Relative: 13 %
MCH: 24.5 pg — ABNORMAL LOW (ref 26.0–34.0)
MCHC: 30.8 g/dL (ref 30.0–36.0)
MCV: 79.6 fL (ref 78.0–100.0)
Monocytes Absolute: 1 10*3/uL (ref 0.1–1.0)
Monocytes Relative: 8 %
NEUTROS PCT: 78 %
Neutro Abs: 8.9 10*3/uL — ABNORMAL HIGH (ref 1.7–7.7)
PLATELETS: 354 10*3/uL (ref 150–400)
RBC: 4.65 MIL/uL (ref 3.87–5.11)
RDW: 17.9 % — ABNORMAL HIGH (ref 11.5–15.5)
WBC: 11.5 10*3/uL — AB (ref 4.0–10.5)

## 2016-03-28 LAB — LIPASE, BLOOD: Lipase: 45 U/L (ref 11–51)

## 2016-03-28 LAB — I-STAT CG4 LACTIC ACID, ED
LACTIC ACID, VENOUS: 2.15 mmol/L — AB (ref 0.5–1.9)
Lactic Acid, Venous: 0.64 mmol/L (ref 0.5–1.9)

## 2016-03-28 MED ORDER — IOPAMIDOL (ISOVUE-300) INJECTION 61%
100.0000 mL | Freq: Once | INTRAVENOUS | Status: AC | PRN
  Administered 2016-03-28: 100 mL via INTRAVENOUS

## 2016-03-28 MED ORDER — SODIUM CHLORIDE 0.9 % IJ SOLN
INTRAMUSCULAR | Status: AC
  Filled 2016-03-28: qty 50

## 2016-03-28 MED ORDER — ACETAMINOPHEN 325 MG PO TABS
650.0000 mg | ORAL_TABLET | Freq: Once | ORAL | Status: AC
Start: 2016-03-28 — End: 2016-03-28
  Administered 2016-03-28: 650 mg via ORAL
  Filled 2016-03-28: qty 2

## 2016-03-28 MED ORDER — ONDANSETRON HCL 4 MG/2ML IJ SOLN
4.0000 mg | Freq: Once | INTRAMUSCULAR | Status: AC
  Administered 2016-03-28: 4 mg via INTRAVENOUS
  Filled 2016-03-28: qty 2

## 2016-03-28 MED ORDER — SODIUM CHLORIDE 0.9 % IV BOLUS (SEPSIS)
500.0000 mL | Freq: Once | INTRAVENOUS | Status: AC
  Administered 2016-03-28: 500 mL via INTRAVENOUS

## 2016-03-28 MED ORDER — FENTANYL CITRATE (PF) 100 MCG/2ML IJ SOLN
50.0000 ug | Freq: Once | INTRAMUSCULAR | Status: AC
  Administered 2016-03-28: 50 ug via INTRAVENOUS
  Filled 2016-03-28: qty 2

## 2016-03-28 MED ORDER — IOPAMIDOL (ISOVUE-300) INJECTION 61%
INTRAVENOUS | Status: AC
  Administered 2016-03-28: 100 mL via INTRAVENOUS
  Filled 2016-03-28: qty 100

## 2016-03-28 MED ORDER — OXYCODONE HCL ER 10 MG PO T12A
40.0000 mg | EXTENDED_RELEASE_TABLET | Freq: Once | ORAL | Status: AC
Start: 2016-03-28 — End: 2016-03-28
  Administered 2016-03-28: 40 mg via ORAL
  Filled 2016-03-28: qty 4

## 2016-03-28 NOTE — ED Notes (Signed)
Patient is A & O to baseline and informed facility as well as patient regarding discharge instructions

## 2016-03-28 NOTE — ED Triage Notes (Signed)
[facility] for aspiration pneumonia and sent here for abdominal pain and diarrhea.

## 2016-03-28 NOTE — ED Notes (Signed)
[facility] has been contacted and well as facility.

## 2016-03-28 NOTE — ED Notes (Signed)
Bed: [id number] Expected date:  Expected time:  Means of arrival:  Comments: 82yo f/abd pain.  C. Diff?

## 2016-03-28 NOTE — Discharge Instructions (Signed)
Continue your augmentin to help resolve the pneumonia.  This may cause loose stools/diarrhea so use caution in taking too many stool softeners with this. If this continues, you may wish to collect stool sample to take to your doctor for analysis.  We tried to do this here, however we could not get a sample. Follow-up with your primary care doctor. Return here for new concerns.

## 2016-03-28 NOTE — ED Provider Notes (Signed)
[facility]-EMERGENCY DEPT Provider Note   CSN: [id number] Arrival date & time: 03/28/16  4071     History   Chief Complaint Chief Complaint  Patient presents with  . Abdominal Pain    HPI [name] is a 82 y.o. female.  The history is provided by the patient and medical records.  Abdominal Pain   Associated symptoms include diarrhea, nausea and vomiting.   32 -year-old female with history of anemia, arthritis, dysrhythmia on Lopressor, presenting to the ED for abdominal pain. Patient was recently admitted to the hospital on 03/24/2016 and discharged on 03/26/2016 after admission for sepsis secondary to pneumonia. She was discharged back to her nursing facility on Augmentin. States when she got back around 1 PM on 03/26/2016 she was started on a bowel regimen for constipation. States she was given several stool softeners for constipation which has caused her to have large, loose bowel movements.  She denies any blood stools.  She also reports some nausea, vomiting, and hiccupping.  Denies fever, chills, sweats.  States she started having upper abdominal pain early this morning. Patient denies urinary symptoms, does currently have an indwelling foley in place which has been draining normally.  Prior abdominal surgeries include appendectomy and hysterectomy.  She doe still have a mild cough.  Denies chest pain or SOB.    Past Medical History:  Diagnosis Date  . Anemia 10-05-12   hgb-9.0 on 09-14-12  . Arm vein blood clot 10-05-12   Rt. arm '12  . Arthritis 10-05-12   degenerative joint disease,scoliosis spine  . Dysrhythmia 10-05-12   tachycardia -tx. Lopressor  . GERD (gastroesophageal reflux disease)   . H/O esophagitis   . H/O hiatal hernia   . Tachycardia   . Transfusion history 10-05-12   4 units blood '12    Patient Active Problem List   Diagnosis Date Noted  . Sepsis (HCC) 03/24/2016  . Aspiration pneumonia (HCC) 03/24/2016  . Urinary retention 03/24/2016  . Hyperglycemia  03/24/2016  . Protein-calorie malnutrition, severe (HCC) 03/24/2016  . Leukocytosis 01/27/2014  . Sinus tachycardia 01/27/2014  . Essential hypertension 01/27/2014  . Colitis 01/24/2014  . Iron deficiency anemia 10/23/2012  . Benign neoplasm of colon 10/23/2012    Past Surgical History:  Procedure Laterality Date  . ABDOMINAL HYSTERECTOMY    . APPENDECTOMY     child  . BACK SURGERY  10-05-12   x5-last fusion with plates  . BREAST ENHANCEMENT SURGERY    . CATARACT EXTRACTION, BILATERAL    . COLONOSCOPY WITH PROPOFOL N/A 10/23/2012   Procedure: COLONOSCOPY WITH PROPOFOL;  Surgeon: Shirley Friar, MD;  Location: [facility] ENDOSCOPY;  Service: Endoscopy;  Laterality: N/A;  . HOT HEMOSTASIS N/A 10/23/2012   Procedure: HOT HEMOSTASIS (ARGON PLASMA COAGULATION/BICAP);  Surgeon: Shirley Friar, MD;  Location: [facility];  Service: Endoscopy;  Laterality: N/A;    OB History    No data available       Home Medications    Prior to Admission medications   Medication Sig Start Date End Date Taking? Authorizing Provider  amoxicillin-clavulanate (AUGMENTIN) 875-125 MG tablet Take 1 tablet by mouth every 12 (twelve) hours. Patient taking differently: Take 1 tablet by mouth every 12 (twelve) hours. 12/10-12/21/17 03/26/16  Yes Marinda Elk, MD  aspirin 81 MG tablet Take 81 mg by mouth at bedtime.    Yes Historical Provider, MD  metoprolol (LOPRESSOR) 50 MG tablet Take 50 mg by mouth 2 (two) times daily.   Yes Historical Provider,  MD  Multiple Vitamin (MULTIVITAMIN WITH MINERALS) TABS tablet Take 1 tablet by mouth daily.   Yes Historical Provider, MD  oxyCODONE (OXYCONTIN) 40 MG 12 hr tablet Take 1 tablet (40 mg total) by mouth every 8 (eight) hours. 03/26/16  Yes Charlynne Cousins, MD  pantoprazole (PROTONIX) 40 MG tablet Take 40 mg by mouth 2 (two) times daily.   Yes Historical Provider, MD  polyethylene glycol (MIRALAX / GLYCOLAX) packet Take 17 g by mouth 2 (two) times daily.  03/26/16  Yes Charlynne Cousins, MD    Family History Family History  Problem Relation Age of Onset  . Cancer Mother   . Cancer Father   . Diabetes Sister   . Stroke Neg Hx   . CAD Neg Hx     Social History Social History  Substance Use Topics  . Smoking status: Never Smoker  . Smokeless tobacco: Never Used  . Alcohol use No     Allergies   Ciprofloxacin   Review of Systems Review of Systems  Gastrointestinal: Positive for abdominal pain, diarrhea, nausea and vomiting.  All other systems reviewed and are negative.    Physical Exam Updated Vital Signs BP 176/83 (BP Location: Left Arm)   Pulse 88   Temp 97.8 F (36.6 C) (Oral)   Resp 20   Ht _0  (1.626 m)   Wt 64.4 kg   SpO2 96%   BMI 24.37 kg/m   Physical Exam  Constitutional: She is oriented to person, place, and time. She appears well-developed and well-nourished.  Moaning during exam, appears uncomfortable  HENT:  Head: Normocephalic and atraumatic.  Mouth/Throat: Oropharynx is clear and moist.  Eyes: Conjunctivae and EOM are normal. Pupils are equal, round, and reactive to light.  Neck: Normal range of motion.  Cardiovascular: Normal rate, regular rhythm and normal heart sounds.   Pulmonary/Chest: Effort normal and breath sounds normal.  Abdominal: Soft. Bowel sounds are normal. There is tenderness. There is no rebound and no CVA tenderness.  Generalized tenderness across the upper abdomen, voluntary guarding  Musculoskeletal: Normal range of motion.  Neurological: She is alert and oriented to person, place, and time.  AAOx3, moving all extremities well, answering questions and following commands when prompted  Skin: Skin is warm and dry.  Psychiatric: She has a normal mood and affect.  Nursing note and vitals reviewed.    ED Treatments / Results  Labs (all labs ordered are listed, but only abnormal results are displayed) Labs Reviewed  CBC WITH DIFFERENTIAL/PLATELET - Abnormal; Notable for  the following:       Result Value   WBC 11.5 (*)    Hemoglobin 11.4 (*)    MCH 24.5 (*)    RDW 17.9 (*)    Neutro Abs 8.9 (*)    All other components within normal limits  COMPREHENSIVE METABOLIC PANEL - Abnormal; Notable for the following:    Sodium 133 (*)    Chloride 100 (*)    Glucose, Bld 130 (*)    All other components within normal limits  I-STAT CG4 LACTIC ACID, ED - Abnormal; Notable for the following:    Lactic Acid, Venous 2.15 (*)    All other components within normal limits  C DIFFICILE QUICK SCREEN W PCR REFLEX  LIPASE, BLOOD  I-STAT CG4 LACTIC ACID, ED    EKG  EKG Interpretation  Date/Time:  Monday March 28 2016 06:45:42 EST Ventricular Rate:  88 PR Interval:    QRS Duration: 76 QT Interval:  431 QTC Calculation: 522 R Axis:   -26 Text Interpretation:  Sinus rhythm Inferior infarct, old Anterior infarct, old Prolonged QT interval No significant change since last tracing Confirmed by Wilkie Aye  MD, Toni Amend ([id number]) on 03/28/2016 6:58:56 AM Also confirmed by Wilkie Aye  MD, COURTNEY ([id number]), editor [place], [place] [phone])  on 03/28/2016 9:03:55 AM       Radiology Dg Chest 2 View  Result Date: 03/28/2016 CLINICAL DATA:  Abdominal pain and diarrhea. Aspiration pneumonia. Cough and vomiting. EXAM: CHEST  2 VIEW COMPARISON:  03/23/2016 and CT chest 09/06/2010. FINDINGS: Rightward tracheal deviation is unchanged from 09/06/2010. Heart size within normal limits. Lungs are clear. Right hemidiaphragm is chronically elevated. No pleural fluid. Humeral heads are high riding. Degenerative changes are seen in the acromioclavicular joints and spine. IMPRESSION: No acute findings. Electronically Signed   By: Leanna Battles M.D.   On: 03/28/2016 07:20   Ct Abdomen Pelvis W Contrast  Result Date: 03/28/2016 CLINICAL DATA:  Abdominal pain with diarrhea and elevated white blood cell count EXAM: CT ABDOMEN AND PELVIS WITH CONTRAST TECHNIQUE: Multidetector CT imaging of the  abdomen and pelvis was performed using the standard protocol following bolus administration of intravenous contrast. CONTRAST:  100 mL Isovue 370 nonionic COMPARISON:  March 23, 2016 January 24, 2014; Sep 06, 2010 FINDINGS: Lower chest: There is persistent reticulonodular interstitial lung disease in the right lower lobe. There is incomplete visualization of a nodular opacity superior segment right lower lobe measuring 7 x 5 mm, seen on the initial CT slice. There are breast implants bilaterally. There is a sizable hiatal type hernia. There are multiple foci of coronary artery calcification. Hepatobiliary: There are calcified granulomas in the liver, unchanged. There are tiny probable cysts in the anterior aspect of the liver on the right near the dome. No new liver lesions evident. The gallbladder wall is not appreciably thickened. There is no biliary duct dilatation. Pancreas: No pancreatic mass or inflammatory focus. The pancreatic tail and body are near the inferior aspect of the hiatal hernia without compromise, unchanged from several days prior. Spleen: Areas of scarring in the spleen are stable. 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 No acute appearing splenic lesions are evident currently. Adrenals/Urinary Tract: Right adrenal appears normal. Mild hypertrophy of the left adrenal is stable. No new adrenal lesions evident. There are subcentimeter cysts in each kidney. There is no hydronephrosis on either side. There is no appreciable thickening in the perinephric fascia on either side. There is no renal or ureteral calculus on either side. Urinary bladder is decompressed with a Foley catheter. Air within the urinary bladder may well be due to instrumentation from Foley catheter placement. The wall of the urinary bladder is not grossly thickened. Stomach/Bowel: There are multiple colonic diverticula without diverticulitis. There is no appreciable bowel wall  or mesenteric thickening. There is no evident bowel obstruction. No free air or portal venous air. Vascular/Lymphatic: There is atherosclerotic calcification in the aorta common iliac arteries. There is also atherosclerotic calcification in both common femoral arteries and hypogastric arteries. No aneurysm evident. Major mesenteric vessels appear patent, although there is moderate atherosclerotic calcification in the proximal celiac and superior mesenteric arteries. No adenopathy is appreciable in the abdomen or pelvis. Reproductive: Uterus is absent. There is no pelvic mass or pelvic fluid collection. Other: Appendix is absent. There is no abscess or ascites in the abdomen or pelvis. There is a small ventral hernia containing only fat.  Musculoskeletal: There is extensive postoperative change in the lumbar spine. There is moderate osteoporosis. There is multilevel arthropathy. No frank blastic or lytic bone lesions are evident. There is no intramuscular lesion. Stranding in the fat of the lateral pelvic walls may be due to stasis phenomenon. IMPRESSION: Persistent reticulonodular opacity in the right lower lobe, concerning for pneumonia or possibly aspiration. There is a 7 x 5 mm nodular opacity within this area. Non-contrast chest CT at 6-12 months is recommended. If the nodule is stable at time of repeat CT, then future CT at 18-24 months (from today's scan) is considered optional for low-risk patients, but is recommended for high-risk patients. This recommendation follows the consensus statement: Guidelines for Management of Incidental Pulmonary Nodules Detected on CT Images: From the Fleischner Society 2017; Radiology 2017; 284:228-243. Sizable hiatal hernia, unchanged. Multiple colonic diverticula without diverticulitis. No bowel obstruction. No abscess. Appendix and uterus absent. Extensive postoperative change in the lumbar spine with scoliosis and osteoporosis. Aortoiliac atherosclerosis without aneurysm.  There is coronary artery calcification. No hydronephrosis. No renal or ureteral calculi. Air within the urinary bladder may well be present secondary to recent instrumentation. Foley catheter noted within urinary bladder. 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 No acute splenic infarct evident. Left adrenal hypertrophy, unchanged. Electronically Signed   By: Lowella Grip III M.D.   On: 03/28/2016 07:47    Procedures Procedures (including critical care time)  Medications Ordered in ED Medications  sodium chloride 0.9 % injection (not administered)  ondansetron (ZOFRAN) injection 4 mg (4 mg Intravenous Given 03/28/16 0627)  sodium chloride 0.9 % bolus 500 mL (0 mLs Intravenous Stopped 03/28/16 1055)  fentaNYL (SUBLIMAZE) injection 50 mcg (50 mcg Intravenous Given 03/28/16 0627)  iopamidol (ISOVUE-300) 61 % injection 100 mL (100 mLs Intravenous Contrast Given 03/28/16 0708)  sodium chloride 0.9 % bolus 500 mL (0 mLs Intravenous Stopped 03/28/16 1056)  acetaminophen (TYLENOL) tablet 650 mg (650 mg Oral Given 03/28/16 0903)  oxyCODONE (OXYCONTIN) 12 hr tablet 40 mg (40 mg Oral Given 03/28/16 1134)     Initial Impression / Assessment and Plan / ED Course  I have reviewed the triage vital signs and the nursing notes.  Pertinent labs & imaging results that were available during my care of the patient were reviewed by me and considered in my medical decision making (see chart for details).  Clinical Course    82 year old female here with abdominal pain. Recent admission for pneumonia, discharged 2 days ago on Augmentin. She was given stool softeners at her facility upon discharge and has had several episodes of loose stool since this time. She has a low rectal grade temp on arrival at 100.43F but is non-toxic in appearance.  She does have upper abdominal tenderness on exam with voluntary guarding.  Labs obtained, lactate mildly elevated.  WBC 11.5 which is large unchanged from time  of discharge.  CXR clear.  CT with noted opacity in RLL which is known, small nodule noted in this area as well.  No acute abdominal findings noted.  Patient was treated here with IVF, fentanyl and zofran with improvement.  Lactate has cleared.  Patient has been stable here.  Remains at her baseline, has been able to drink water, take oral medications, and talk to her cell phone with her son.  Patient was updated with results.  Some of her loose BM's likely due to the stool softener or the augmentin itself as this is a known side effect. She has not had  any BM's here for stool sample collection.  No findings of colitis and no uncontrolled diarrhea here so feel c.diff less likely, but will send with collection kit should SNF be able to collect stool.  Will have her follow-up with her PCP.  Discussed plan with patient, she acknowledged understanding and agreed with plan of care.  Return precautions given for new or worsening symptoms.  Case discussed with attending physician, Dr. Eulis Foster, who evaluated patient and agrees with treatment plan.  Final Clinical Impressions(s) / ED Diagnoses   Final diagnoses:  Abdominal pain, unspecified abdominal location  Loose stools  Aspiration pneumonia of right upper lobe, unspecified aspiration pneumonia type [facility])    New Prescriptions Discharge Medication List as of 03/28/2016 12:35 PM       Larene Pickett, PA-C 03/28/16 1437    Larene Pickett, PA-C 03/28/16 1437    Daleen Bo, MD 03/28/16 1929

## 2016-03-28 NOTE — ED Notes (Signed)
No reaction to medication noted alert and oriented x 3 call light in reach no respiratory or acute distress noted.

## 2016-03-29 ENCOUNTER — Non-Acute Institutional Stay (SKILLED_NURSING_FACILITY): Payer: Medicare Other | Admitting: Internal Medicine

## 2016-03-29 ENCOUNTER — Encounter: Payer: Self-pay | Admitting: Internal Medicine

## 2016-03-29 DIAGNOSIS — I10 Essential (primary) hypertension: Secondary | ICD-10-CM

## 2016-03-29 DIAGNOSIS — M150 Primary generalized (osteo)arthritis: Secondary | ICD-10-CM

## 2016-03-29 DIAGNOSIS — J690 Pneumonitis due to inhalation of food and vomit: Secondary | ICD-10-CM

## 2016-03-29 DIAGNOSIS — R339 Retention of urine, unspecified: Secondary | ICD-10-CM | POA: Diagnosis not present

## 2016-03-29 DIAGNOSIS — M159 Polyosteoarthritis, unspecified: Secondary | ICD-10-CM

## 2016-03-29 DIAGNOSIS — G894 Chronic pain syndrome: Secondary | ICD-10-CM

## 2016-03-29 DIAGNOSIS — R5381 Other malaise: Secondary | ICD-10-CM

## 2016-03-29 LAB — CULTURE, BLOOD (ROUTINE X 2)
CULTURE: NO GROWTH
Culture: NO GROWTH

## 2016-03-29 NOTE — Progress Notes (Signed)
Patient ID: [name], female   DOB: 1932/10/04, 82 y.o.   MRN: [id number]    HISTORY AND PHYSICAL   DATE: 03/29/2016  Location:    [place] Room Number: 155 A Place of Service: SNF (31)   Extended Emergency Contact Information Primary Emergency Contact: Hill-Eren Ryser,Sara  [place] Phone: [phone] Relation: Other  Advanced Directive information Does Patient Have a Medical Advance Directive?: No, Does patient want to make changes to medical advance directive?: No - Patient declined  Chief Complaint  Patient presents with  . New Admit To SNF    HPI:  82 yo female seen today as a new admission into SNF following hospital stay for aspiration pneumonia, leukocytosis, urinary retention, hyperglycemia, severe protein calorie malnutrition, HTN. CBG 293 upon arrival to the ED with HR 130s/O2 sat 78% and she had pinpoint pupils. Mental changes thought to be due to polypharmacy. Medications adjusted. She was started on augmentin. Albumin 3.4; AST 51; Na 133; WBC 23.6K --> 11.5K; abs neutrophils 20.3K--->8.9K; Plts 643K -->354K; A1c 6%; Hgb 9.6-->11.4. She presents to SNF for short term rehab.  Today she reports pain uncontrolled. She takes Oxycodone ER 83m q8hrs. She was given plain oxycodone last night as her Rx had not arrived yet. she has chronic pain due to OA. 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 she had urinary retention prior to hospital d/c. She went to the ED since admission due to N/V and GI upset from augmentin. abx stopped and she reports no further issues  Hypertension - stable on lopressor 50 mg twice daily; takes ASA 81 mg daily   Osteoarthritis with chronic pain - currently gets oxycontin 40 mg every 12 hours  Hx  Anemia -  hgb 11.4; she has req'd PRBC transfusion in the past  GERD - stable on protonix 40 mg twice daily   Constipation - stable on miralax twice daily   Protein calorie  malnutrition - albumin 3.4; she gets nutritional supplements per facility protocol and MVI   Past Medical History:  Diagnosis Date  . Anemia 10-05-12   hgb-9.0 on 09-14-12  . Arm vein blood clot 10-05-12   Rt. arm '12  . Arthritis 10-05-12   degenerative joint disease,scoliosis spine  . Dysrhythmia 10-05-12   tachycardia -tx. Lopressor  . GERD (gastroesophageal reflux disease)   . H/O esophagitis   . H/O hiatal hernia   . Tachycardia   . Transfusion history 10-05-12   4 units blood '12    Past Surgical History:  Procedure Laterality Date  . ABDOMINAL HYSTERECTOMY    . APPENDECTOMY     child  . BACK SURGERY  10-05-12   x5-last fusion with plates  . BREAST ENHANCEMENT SURGERY    . CATARACT EXTRACTION, BILATERAL    . COLONOSCOPY WITH PROPOFOL N/A 10/23/2012   Procedure: COLONOSCOPY WITH PROPOFOL;  Surgeon: VLear Ng MD;  Location: [facility];  Service: Endoscopy;  Laterality: N/A;  . HOT HEMOSTASIS N/A 10/23/2012   Procedure: HOT HEMOSTASIS (ARGON PLASMA COAGULATION/BICAP);  Surgeon: VLear Ng MD;  [facility];  Service: Endoscopy;  Laterality: N/A;    Patient Care Team: L.DDonnie Coffin MD as PCP - General  Social History   Social History  . Marital status: Widowed    Spouse name: N/A  . Number of children: N/A  . Years of education: N/A   Occupational History  . Not on file.   Social History Main Topics  .  Smoking status: Never Smoker  . Smokeless tobacco: Never Used  . Alcohol use No  . Drug use: No  . Sexual activity: Not Currently   Other Topics Concern  . Not on file   Social History Narrative  . No narrative on file     reports that she has never smoked. She has never used smokeless tobacco. She reports that she does not drink alcohol or use drugs.  Family History  Problem Relation Age of Onset  . Cancer Mother   . Cancer Father   . Diabetes Sister   . Stroke Neg Hx   . CAD Neg Hx    Family Status  Relation Status   . Mother Deceased  . Father Deceased  . Sister Deceased  . Neg Hx     Immunization History  Administered Date(s) Administered  . Influenza,inj,Quad PF,36+ Mos 01/27/2014    Allergies  Allergen Reactions  . Ciprofloxacin Rash    burning    Medications: Patient's Medications  New Prescriptions   No medications on file  Previous Medications   ASPIRIN 81 MG TABLET    Take 81 mg by mouth at bedtime.    DOXYCYCLINE (VIBRAMYCIN) 100 MG CAPSULE    Take 100 mg by mouth 2 (two) times daily.   METOPROLOL (LOPRESSOR) 50 MG TABLET    Take 50 mg by mouth 2 (two) times daily.   MULTIPLE VITAMIN (MULTIVITAMIN WITH MINERALS) TABS TABLET    Take 1 tablet by mouth daily.   OXYCODONE (OXYCONTIN) 40 MG 12 HR TABLET    Take 40 mg by mouth every 12 (twelve) hours.   PANTOPRAZOLE (PROTONIX) 40 MG TABLET    Take 40 mg by mouth 2 (two) times daily.   POLYETHYLENE GLYCOL (MIRALAX / GLYCOLAX) PACKET    Take 17 g by mouth 2 (two) times daily.  Modified Medications   No medications on file  Discontinued Medications   AMOXICILLIN-CLAVULANATE (AUGMENTIN) 875-125 MG TABLET    Take 1 tablet by mouth every 12 (twelve) hours.   OXYCODONE (OXYCONTIN) 40 MG 12 HR TABLET    Take 1 tablet (40 mg total) by mouth every 8 (eight) hours.    Review of Systems  Respiratory: Positive for cough (dry).   Genitourinary: Positive for difficulty urinating.  Musculoskeletal: Positive for arthralgias, back pain and gait problem.  All other systems reviewed and are negative.   Vitals:   03/29/16 0914  BP: (!) 159/81  Pulse: 87  Resp: 18  Temp: 98.6 F (37 C)  TempSrc: Oral  SpO2: 92%  Weight: 135 lb 6.4 oz (61.4 kg)  Height: _0  (1.626 m)   Body mass index is 23.24 kg/m.  Physical Exam  Constitutional: She is oriented to person, place, and time. She appears well-developed.  Frail appearing in NAD,sitting in w/c  HENT:  Mouth/Throat: Oropharynx is clear and moist. No oropharyngeal exudate.  Eyes: Pupils  are equal, round, and reactive to light. No scleral icterus.  Neck: Neck supple. Carotid bruit is not present. No tracheal deviation present. No thyromegaly present.  Cardiovascular: Normal rate, regular rhythm and intact distal pulses.  Exam reveals no gallop and no friction rub.   Murmur (1/6 SEM) heard. No LE edema b/l. no calf TTP.   Pulmonary/Chest: Effort normal and breath sounds normal. No stridor. No respiratory distress. She has no wheezes. She has no rales.  Reduced BS right base. no rhonchi  Abdominal: Soft. Bowel sounds are normal. She exhibits no distension and no mass. There  is no hepatomegaly. There is no tenderness. There is no rebound and no guarding.  Musculoskeletal: She exhibits edema, tenderness and deformity (small joint deformities).  Lymphadenopathy:    She has no cervical adenopathy.  Neurological: She is alert and oriented to person, place, and time.  Skin: Skin is warm and dry. No rash noted.  Psychiatric: She has a normal mood and affect. Her behavior is normal. Judgment and thought content normal.   Labs reviewed: Admission on 03/28/2016, Discharged on 03/28/2016  Component Date Value Ref Range Status  . WBC 03/28/2016 11.5* 4.0 - 10.5 K/uL Final  . RBC 03/28/2016 4.65  3.87 - 5.11 MIL/uL Final  . Hemoglobin 03/28/2016 11.4* 12.0 - 15.0 g/dL Final  . HCT 03/28/2016 37.0  36.0 - 46.0 % Final  . MCV 03/28/2016 79.6  78.0 - 100.0 fL Final  . MCH 03/28/2016 24.5* 26.0 - 34.0 pg Final  . MCHC 03/28/2016 30.8  30.0 - 36.0 g/dL Final  . RDW 03/28/2016 17.9* 11.5 - 15.5 % Final  . Platelets 03/28/2016 354  150 - 400 K/uL Final  . Neutrophils Relative % 03/28/2016 78  % Final  . Neutro Abs 03/28/2016 8.9* 1.7 - 7.7 K/uL Final  . Lymphocytes Relative 03/28/2016 13  % Final  . Lymphs Abs 03/28/2016 1.5  0.7 - 4.0 K/uL Final  . Monocytes Relative 03/28/2016 8  % Final  . Monocytes Absolute 03/28/2016 1.0  0.1 - 1.0 K/uL Final  . Eosinophils Relative 03/28/2016 0  %  Final  . Eosinophils Absolute 03/28/2016 0.0  0.0 - 0.7 K/uL Final  . Basophils Relative 03/28/2016 1  % Final  . Basophils Absolute 03/28/2016 0.1  0.0 - 0.1 K/uL Final  . Sodium 03/28/2016 133* 135 - 145 mmol/L Final  . Potassium 03/28/2016 4.5  3.5 - 5.1 mmol/L Final  . Chloride 03/28/2016 100* 101 - 111 mmol/L Final  . CO2 03/28/2016 23  22 - 32 mmol/L Final  . Glucose, Bld 03/28/2016 130* 65 - 99 mg/dL Final  . BUN 03/28/2016 13  6 - 20 mg/dL Final  . Creatinine, Ser 03/28/2016 0.64  0.44 - 1.00 mg/dL Final  . Calcium 03/28/2016 9.4  8.9 - 10.3 mg/dL Final  . Total Protein 03/28/2016 7.6  6.5 - 8.1 g/dL Final  . Albumin 03/28/2016 3.9  3.5 - 5.0 g/dL Final  . AST 03/28/2016 29  15 - 41 U/L Final  . ALT 03/28/2016 24  14 - 54 U/L Final  . Alkaline Phosphatase 03/28/2016 45  38 - 126 U/L Final  . Total Bilirubin 03/28/2016 0.6  0.3 - 1.2 mg/dL Final  . GFR calc non Af Amer 03/28/2016 >60  >60 mL/min Final  . GFR calc Af Amer 03/28/2016 >60  >60 mL/min Final   Comment: (NOTE) The eGFR has been calculated using the CKD EPI equation. This calculation has not been validated in all clinical situations. eGFR's persistently <60 mL/min signify possible Chronic Kidney Disease.   . Anion gap 03/28/2016 10  5 - 15 Final  . Lipase 03/28/2016 45  11 - 51 U/L Final  . Lactic Acid, Venous 03/28/2016 2.15* 0.5 - 1.9 mmol/L Final  . Comment 03/28/2016 NOTIFIED PHYSICIAN   Final  . Lactic Acid, Venous 03/28/2016 0.64  0.5 - 1.9 mmol/L Final  Admission on 03/23/2016, Discharged on 03/27/2016  Component Date Value Ref Range Status  . WBC 03/23/2016 23.6* 4.0 - 10.5 K/uL Final  . RBC 03/23/2016 4.94  3.87 - 5.11 MIL/uL Final  .  Hemoglobin 03/23/2016 12.0  12.0 - 15.0 g/dL Final  . HCT 03/23/2016 39.3  36.0 - 46.0 % Final  . MCV 03/23/2016 79.6  78.0 - 100.0 fL Final  . MCH 03/23/2016 24.3* 26.0 - 34.0 pg Final  . MCHC 03/23/2016 30.5  30.0 - 36.0 g/dL Final  . RDW 03/23/2016 17.2* 11.5 - 15.5 %  Final  . Platelets 03/23/2016 643* 150 - 400 K/uL Final  . Neutrophils Relative % 03/23/2016 86  % Final  . Neutro Abs 03/23/2016 20.3* 1.7 - 7.7 K/uL Final  . Lymphocytes Relative 03/23/2016 4  % Final  . Lymphs Abs 03/23/2016 0.9  0.7 - 4.0 K/uL Final  . Monocytes Relative 03/23/2016 10  % Final  . Monocytes Absolute 03/23/2016 2.4* 0.1 - 1.0 K/uL Final  . Eosinophils Relative 03/23/2016 0  % Final  . Eosinophils Absolute 03/23/2016 0.0  0.0 - 0.7 K/uL Final  . Basophils Relative 03/23/2016 0  % Final  . Basophils Absolute 03/23/2016 0.0  0.0 - 0.1 K/uL Final  . Sodium 03/23/2016 132* 135 - 145 mmol/L Final  . Potassium 03/23/2016 4.3  3.5 - 5.1 mmol/L Final  . Chloride 03/23/2016 97* 101 - 111 mmol/L Final  . CO2 03/23/2016 22  22 - 32 mmol/L Final  . Glucose, Bld 03/23/2016 153* 65 - 99 mg/dL Final  . BUN 03/23/2016 19  6 - 20 mg/dL Final  . Creatinine, Ser 03/23/2016 0.98  0.44 - 1.00 mg/dL Final  . Calcium 03/23/2016 9.6  8.9 - 10.3 mg/dL Final  . Total Protein 03/23/2016 7.9  6.5 - 8.1 g/dL Final  . Albumin 03/23/2016 4.3  3.5 - 5.0 g/dL Final  . AST 03/23/2016 43* 15 - 41 U/L Final  . ALT 03/23/2016 15  14 - 54 U/L Final  . Alkaline Phosphatase 03/23/2016 47  38 - 126 U/L Final  . Total Bilirubin 03/23/2016 0.3  0.3 - 1.2 mg/dL Final  . GFR calc non Af Amer 03/23/2016 53* >60 mL/min Final  . GFR calc Af Amer 03/23/2016 >60  >60 mL/min Final   Comment: (NOTE) The eGFR has been calculated using the CKD EPI equation. This calculation has not been validated in all clinical situations. eGFR's persistently <60 mL/min signify possible Chronic Kidney Disease.   . Anion gap 03/23/2016 13  5 - 15 Final  . Lactic Acid, Venous 03/23/2016 4.74* 0.5 - 1.9 mmol/L Final  . Comment 03/23/2016 NOTIFIED PHYSICIAN   Final  . Color, Urine 03/23/2016 YELLOW  YELLOW Final  . APPearance 03/23/2016 CLEAR  CLEAR Final  . Specific Gravity, Urine 03/23/2016 1.009  1.005 - 1.030 Final  . pH  03/23/2016 7.0  5.0 - 8.0 Final  . Glucose, UA 03/23/2016 NEGATIVE  NEGATIVE mg/dL Final  . Hgb urine dipstick 03/23/2016 NEGATIVE  NEGATIVE Final  . Bilirubin Urine 03/23/2016 NEGATIVE  NEGATIVE Final  . Ketones, ur 03/23/2016 NEGATIVE  NEGATIVE mg/dL Final  . Protein, ur 03/23/2016 NEGATIVE  NEGATIVE mg/dL Final  . Nitrite 03/23/2016 NEGATIVE  NEGATIVE Final  . Leukocytes, UA 03/23/2016 NEGATIVE  NEGATIVE Final  . FIO2 03/23/2016 21.00   Final  . pH, Ven 03/23/2016 7.451* 7.250 - 7.430 Final  . pCO2, Ven 03/23/2016 36.4* 44.0 - 60.0 mmHg Final  . pO2, Ven 03/23/2016 BELOW REPORTABLE RANGE.  32.0 - 45.0 mmHg Final   Comment: CRITICAL RESULT CALLED TO, READ BACK BY AND VERIFIED WITH: Karlyne Greenspan, MD AT 2105 ON 03/23/16 BY Coralie Common, RRT, RCP   . Bicarbonate  03/23/2016 25.2  20.0 - 28.0 mmol/L Final  . Acid-Base Excess 03/23/2016 1.6  0.0 - 2.0 mmol/L Final  . O2 Saturation 03/23/2016 39.4  % Final  . Patient temperature 03/23/2016 97.2   Final  . Collection site 03/23/2016 VEIN   Final  . Drawn by 03/23/2016 [id number]   Final  . Sample type 03/23/2016 VENOUS   Final  . Lactic Acid, Venous 03/23/2016 3.83* 0.5 - 1.9 mmol/L Final  . Comment 03/23/2016 NOTIFIED PHYSICIAN   Final  . Influenza A By PCR 03/24/2016 NEGATIVE  NEGATIVE Final  . Influenza B By PCR 03/24/2016 NEGATIVE  NEGATIVE Final   Comment: (NOTE) The Xpert Xpress Flu assay is intended as an aid in the diagnosis of  influenza and should not be used as a sole basis for treatment.  This  assay is FDA approved for nasopharyngeal swab specimens only. Nasal  washings and aspirates are unacceptable for Xpert Xpress Flu testing.   Marland Kitchen Specimen Description 03/28/2016 BLOOD RIGHT HAND   Final  . Special Requests 03/28/2016 BOTTLES DRAWN AEROBIC AND ANAEROBIC 5CC   Final  . Culture 03/28/2016    Final                   Value:NO GROWTH 4 DAYS Performed at [facility]   . Report Status 03/28/2016 PENDING   Incomplete  . Specimen  Description 03/28/2016 BLOOD RIGHT ARM   Final  . Special Requests 03/28/2016 BOTTLES DRAWN AEROBIC ONLY 5CC   Final  . Culture 03/28/2016    Final                   Value:NO GROWTH 4 DAYS Performed at [facility]   . Report Status 03/28/2016 PENDING   Incomplete  . Strep Pneumo Urinary Antigen 03/24/2016 NEGATIVE  NEGATIVE Final   Comment:        Infection due to S. pneumoniae cannot be absolutely ruled out since the antigen present may be below the detection limit of the test. Performed at [facility]   . Lactic Acid, Venous 03/24/2016 1.5  0.5 - 1.9 mmol/L Final  . Lactic Acid, Venous 03/24/2016 1.2  0.5 - 1.9 mmol/L Final  . Procalcitonin 03/24/2016 0.43  ng/mL Final   Comment:        Interpretation: PCT (Procalcitonin) <= 0.5 ng/mL: Systemic infection (sepsis) is not likely. Local bacterial infection is possible. (NOTE)         ICU PCT Algorithm               Non ICU PCT Algorithm    ----------------------------     ------------------------------         PCT < 0.25 ng/mL                 PCT < 0.1 ng/mL     Stopping of antibiotics            Stopping of antibiotics       strongly encouraged.               strongly encouraged.    ----------------------------     ------------------------------       PCT level decrease by               PCT < 0.25 ng/mL       >= 80% from peak PCT       OR PCT 0.25 - 0.5 ng/mL  Stopping of antibiotics                                             encouraged.     Stopping of antibiotics           encouraged.    ----------------------------     ------------------------------       PCT level decrease by              PCT >= 0.25 ng/mL       < 80% from peak PCT        AND PCT >= 0.5 ng/mL            Continuin                          g antibiotics                                              encouraged.       Continuing antibiotics            encouraged.    ----------------------------     ------------------------------      PCT level increase compared          PCT > 0.5 ng/mL         with peak PCT AND          PCT >= 0.5 ng/mL             Escalation of antibiotics                                          strongly encouraged.      Escalation of antibiotics        strongly encouraged.   . Prothrombin Time 03/24/2016 14.6  11.4 - 15.2 seconds Final  . INR 03/24/2016 1.14   Final  . aPTT 03/24/2016 31  24 - 36 seconds Final  . Hgb A1c MFr Bld 03/25/2016 6.0* 4.8 - 5.6 % Final   Comment: (NOTE)         Pre-diabetes: 5.7 - 6.4         Diabetes: >6.4         Glycemic control for adults with diabetes: <7.0   . Mean Plasma Glucose 03/25/2016 126  mg/dL Final   Comment: (NOTE) Performed At: [facility] [place], [place] [id number] Lindon Romp MD [id number]   . Magnesium 03/24/2016 1.8  1.7 - 2.4 mg/dL Final  . Phosphorus 03/24/2016 2.6  2.5 - 4.6 mg/dL Final  . TSH 03/24/2016 1.380  0.350 - 4.500 uIU/mL Final  . Sodium 03/24/2016 133* 135 - 145 mmol/L Final  . Potassium 03/24/2016 3.5  3.5 - 5.1 mmol/L Final   Comment: DELTA CHECK NOTED REPEATED TO VERIFY   . Chloride 03/24/2016 103  101 - 111 mmol/L Final  . CO2 03/24/2016 23  22 - 32 mmol/L Final  . Glucose, Bld 03/24/2016 131* 65 - 99 mg/dL Final  . BUN 03/24/2016 12  6 - 20 mg/dL Final  . Creatinine, Ser 03/24/2016 0.64  0.44 - 1.00 mg/dL Final  .  Calcium 03/24/2016 8.2* 8.9 - 10.3 mg/dL Final  . Total Protein 03/24/2016 6.3* 6.5 - 8.1 g/dL Final  . Albumin 03/24/2016 3.4* 3.5 - 5.0 g/dL Final  . AST 03/24/2016 51* 15 - 41 U/L Final  . ALT 03/24/2016 17  14 - 54 U/L Final  . Alkaline Phosphatase 03/24/2016 36* 38 - 126 U/L Final  . Total Bilirubin 03/24/2016 0.4  0.3 - 1.2 mg/dL Final  . GFR calc non Af Amer 03/24/2016 >60  >60 mL/min Final  . GFR calc Af Amer 03/24/2016 >60  >60 mL/min Final   Comment: (NOTE) The eGFR has been calculated using the CKD EPI equation. This calculation has not been validated in all  clinical situations. eGFR's persistently <60 mL/min signify possible Chronic Kidney Disease.   . Anion gap 03/24/2016 7  5 - 15 Final  . WBC 03/24/2016 11.4* 4.0 - 10.5 K/uL Final  . RBC 03/24/2016 3.84* 3.87 - 5.11 MIL/uL Final  . Hemoglobin 03/24/2016 9.6* 12.0 - 15.0 g/dL Final  . HCT 03/24/2016 30.4* 36.0 - 46.0 % Final  . MCV 03/24/2016 79.2  78.0 - 100.0 fL Final  . MCH 03/24/2016 25.0* 26.0 - 34.0 pg Final  . MCHC 03/24/2016 31.6  30.0 - 36.0 g/dL Final  . RDW 03/24/2016 17.5* 11.5 - 15.5 % Final  . Platelets 03/24/2016 521* 150 - 400 K/uL Final  . Troponin I 03/24/2016 0.04* <0.03 ng/mL Final   Comment: CRITICAL RESULT CALLED TO, READ BACK BY AND VERIFIED WITHDiana Eves RN [id number] 03/24/16 A NAVARRO   . Troponin I 03/24/2016 0.03* <0.03 ng/mL Final  . Troponin I 03/24/2016 <0.03  <0.03 ng/mL Final  . Glucose-Capillary 03/24/2016 139* 65 - 99 mg/dL Final  . Comment 1 03/24/2016 Notify RN   Final  . Comment 2 03/24/2016 Document in Chart   Final  . MRSA by PCR 03/24/2016 NEGATIVE  NEGATIVE Final   Comment:        The GeneXpert MRSA Assay (FDA approved for NASAL specimens only), is one component of a comprehensive MRSA colonization surveillance program. It is not intended to diagnose MRSA infection nor to guide or monitor treatment for MRSA infections.   . Glucose-Capillary 03/24/2016 141* 65 - 99 mg/dL Final  . Comment 1 03/24/2016 Notify RN   Final  . Glucose-Capillary 03/24/2016 141* 65 - 99 mg/dL Final  . Comment 1 03/24/2016 Notify RN   Final  . Comment 2 03/24/2016 Document in Chart   Final  . Glucose-Capillary 03/24/2016 104* 65 - 99 mg/dL Final  . Glucose-Capillary 03/24/2016 102* 65 - 99 mg/dL Final  . Glucose-Capillary 03/25/2016 113* 65 - 99 mg/dL Final  . Comment 1 03/25/2016 Notify RN   Final  . Glucose-Capillary 03/25/2016 101* 65 - 99 mg/dL Final  . Comment 1 03/25/2016 Notify RN   Final  . Glucose-Capillary 03/25/2016 107* 65 - 99 mg/dL Final  . Comment  1 03/25/2016 Notify RN   Final  . Comment 2 03/25/2016 Document in Chart   Final  . Glucose-Capillary 03/25/2016 115* 65 - 99 mg/dL Final  . Comment 1 03/25/2016 Notify RN   Final  . Comment 2 03/25/2016 Document in Chart   Final  . Glucose-Capillary 03/25/2016 104* 65 - 99 mg/dL Final  . Comment 1 03/25/2016 Notify RN   Final  . Comment 2 03/25/2016 Document in Chart   Final  . Glucose-Capillary 03/25/2016 97  65 - 99 mg/dL Final  . Comment 1 03/25/2016 Notify RN   Final  .  Glucose-Capillary 03/25/2016 117* 65 - 99 mg/dL Final  . Comment 1 03/25/2016 Notify RN   Final  . Glucose-Capillary 03/26/2016 105* 65 - 99 mg/dL Final  . Comment 1 03/26/2016 Notify RN   Final  . Glucose-Capillary 03/26/2016 127* 65 - 99 mg/dL Final  . Glucose-Capillary 03/26/2016 131* 65 - 99 mg/dL Final  . Glucose-Capillary 03/26/2016 128* 65 - 99 mg/dL Final  . Glucose-Capillary 03/26/2016 114* 65 - 99 mg/dL Final  . Comment 1 03/26/2016 Notify RN   Final  . Glucose-Capillary 03/27/2016 139* 65 - 99 mg/dL Final  . Comment 1 03/27/2016 Notify RN   Final  . Glucose-Capillary 03/27/2016 109* 65 - 99 mg/dL Final  . Comment 1 03/27/2016 Notify RN   Final  . Glucose-Capillary 03/27/2016 131* 65 - 99 mg/dL Final  . Glucose-Capillary 03/27/2016 95  65 - 99 mg/dL Final    Dg Chest 2 View  Result Date: 03/28/2016 CLINICAL DATA:  Abdominal pain and diarrhea. Aspiration pneumonia. Cough and vomiting. EXAM: CHEST  2 VIEW COMPARISON:  03/23/2016 and CT chest 09/06/2010. FINDINGS: Rightward tracheal deviation is unchanged from 09/06/2010. Heart size within normal limits. Lungs are clear. Right hemidiaphragm is chronically elevated. No pleural fluid. Humeral heads are high riding. Degenerative changes are seen in the acromioclavicular joints and spine. IMPRESSION: No acute findings. Electronically Signed   By: Lorin Picket M.D.   On: 03/28/2016 07:20   Dg Chest 2 View  Result Date: 03/23/2016 CLINICAL DATA:  Altered  mental status EXAM: CHEST  2 VIEW COMPARISON:  04/10/2015 FINDINGS: Elevated right hemidiaphragm unchanged. Right lower lobe atelectasis unchanged. Negative for pneumonia. Negative for heart failure. Moderate hiatal hernia with air-fluid level. IMPRESSION: No active cardiopulmonary disease. Electronically Signed   By: Franchot Gallo M.D.   On: 03/23/2016 20:27   Ct Head Wo Contrast  Result Date: 03/23/2016 CLINICAL DATA:  Altered mental status EXAM: CT HEAD WITHOUT CONTRAST TECHNIQUE: Contiguous axial images were obtained from the base of the skull through the vertex without intravenous contrast. COMPARISON:  CT head 05/22/2006 FINDINGS: Brain: Progressive atrophy since the prior study. Chronic microvascular ischemic change in the white matter also mildly progressive. 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 Negative for acute infarct.  Negative for hemorrhage or mass. Vascular: No hyperdense vessel or unexpected calcification. Skull: Negative Sinuses/Orbits: Negative Other: None IMPRESSION: Atrophy and chronic microvascular ischemia have progressed since 2008. No acute intracranial abnormality. Electronically Signed   By: Franchot Gallo M.D.   On: 03/23/2016 20:29   Ct Abdomen Pelvis W Contrast  Result Date: 03/28/2016 CLINICAL DATA:  Abdominal pain with diarrhea and elevated white blood cell count EXAM: CT ABDOMEN AND PELVIS WITH CONTRAST TECHNIQUE: Multidetector CT imaging of the abdomen and pelvis was performed using the standard protocol following bolus administration of intravenous contrast. CONTRAST:  100 mL Isovue 370 nonionic COMPARISON:  March 23, 2016 January 24, 2014; Sep 06, 2010 FINDINGS: Lower chest: There is persistent reticulonodular interstitial lung disease in the right lower lobe. There is incomplete visualization of a nodular opacity superior segment right lower lobe measuring 7 x 5 mm, seen on the initial CT slice. There are breast implants bilaterally. There is a sizable  hiatal type hernia. There are multiple foci of coronary artery calcification. Hepatobiliary: There are calcified granulomas in the liver, unchanged. There are tiny probable cysts in the anterior aspect of the liver on the right near the dome. No new liver lesions evident. The gallbladder wall is not appreciably thickened. There is  no biliary duct dilatation. Pancreas: No pancreatic mass or inflammatory focus. The pancreatic tail and body are near the inferior aspect of the hiatal hernia without compromise, unchanged from several days prior. Spleen: Areas of scarring in the spleen are stable. 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 No acute appearing splenic lesions are evident currently. Adrenals/Urinary Tract: Right adrenal appears normal. Mild hypertrophy of the left adrenal is stable. No new adrenal lesions evident. There are subcentimeter cysts in each kidney. There is no hydronephrosis on either side. There is no appreciable thickening in the perinephric fascia on either side. There is no renal or ureteral calculus on either side. Urinary bladder is decompressed with a Foley catheter. Air within the urinary bladder may well be due to instrumentation from Foley catheter placement. The wall of the urinary bladder is not grossly thickened. Stomach/Bowel: There are multiple colonic diverticula without diverticulitis. There is no appreciable bowel wall or mesenteric thickening. There is no evident bowel obstruction. No free air or portal venous air. Vascular/Lymphatic: There is atherosclerotic calcification in the aorta common iliac arteries. There is also atherosclerotic calcification in both common femoral arteries and hypogastric arteries. No aneurysm evident. Major mesenteric vessels appear patent, although there is moderate atherosclerotic calcification in the proximal celiac and superior mesenteric arteries. No adenopathy is appreciable in the abdomen or  pelvis. Reproductive: Uterus is absent. There is no pelvic mass or pelvic fluid collection. Other: Appendix is absent. There is no abscess or ascites in the abdomen or pelvis. There is a small ventral hernia containing only fat. Musculoskeletal: There is extensive postoperative change in the lumbar spine. There is moderate osteoporosis. There is multilevel arthropathy. No frank blastic or lytic bone lesions are evident. There is no intramuscular lesion. Stranding in the fat of the lateral pelvic walls may be due to stasis phenomenon. IMPRESSION: Persistent reticulonodular opacity in the right lower lobe, concerning for pneumonia or possibly aspiration. There is a 7 x 5 mm nodular opacity within this area. Non-contrast chest CT at 6-12 months is recommended. If the nodule is stable at time of repeat CT, then future CT at 18-24 months (from today's scan) is considered optional for low-risk patients, but is recommended for high-risk patients. This recommendation follows the consensus statement: Guidelines for Management of Incidental Pulmonary Nodules Detected on CT Images: From the Fleischner Society 2017; Radiology 2017; 284:228-243. Sizable hiatal hernia, unchanged. Multiple colonic diverticula without diverticulitis. No bowel obstruction. No abscess. Appendix and uterus absent. Extensive postoperative change in the lumbar spine with scoliosis and osteoporosis. Aortoiliac atherosclerosis without aneurysm. There is coronary artery calcification. No hydronephrosis. No renal or ureteral calculi. Air within the urinary bladder may well be present secondary to recent instrumentation. Foley catheter noted within urinary bladder. 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 No acute splenic infarct evident. Left adrenal hypertrophy, unchanged. Electronically Signed   By: Lowella Grip III M.D.   On: 03/28/2016 07:47   Ct Abdomen Pelvis W Contrast  Result Date: 03/24/2016 CLINICAL DATA:  Sepsis, fever,  abdominal pain. EXAM: CT ABDOMEN AND PELVIS WITH CONTRAST TECHNIQUE: Multidetector CT imaging of the abdomen and pelvis was performed using the standard protocol following bolus administration of intravenous contrast. CONTRAST:  173m ISOVUE-300 IOPAMIDOL (ISOVUE-300) INJECTION 61% COMPARISON:  CT 01/24/2014 FINDINGS: Lower chest: Reticulonodular opacities in the visualized right lung. Mild fissural thickening. Breathing motion artifact limits assessment. Moderate hiatal hernia Hepatobiliary: Cyst near the gallbladder fossa. Small hepatic granulomas. Gallbladder  physiologically distended, no calcified stone. No biliary dilatation. Pancreas: Motion artifact limits assessment. Portions of the pancreatic body and tail extended through hiatal hernia. Spleen: Small hypodensities in the mid upper spleen with adjacent contour deformity Ing, unchanged from prior CT. Adrenals/Urinary Tract: No adrenal nodule. No hydronephrosis or perinephric edema. Motion artifact limits detailed assessment. Symmetric excretion on delayed phase imaging. Urinary bladder is distended, no wall thickening. Stomach/Bowel: Moderate hiatal hernia. No bowel wall thickening or inflammation. Motion artifact through the mid abdomen. Moderate stool burden throughout. There is distal colonic diverticulosis without acute diverticulitis. There is pelvic floor descent. The appendix is not visualized. Vascular/Lymphatic: Aortic atherosclerosis and tortuosity. No aneurysm. No definite adenopathy, motion limits evaluation. Reproductive: Uterus is not seen, may be surgically absent or atrophic. Other: No evidence of free air free fluid. Musculoskeletal: Scoliotic curvature in the spine. Posterior fusion hardware L1 through the sacrum. There are no acute or suspicious osseous abnormalities. IMPRESSION: 1. No acute finding in the abdomen/pelvis. 2. Reticulonodular opacities in the right lung, may be infectious or inflammatory. Aspiration not excluded. 3. Chronic  findings include colonic diverticulosis without diverticulitis, chronic splenic deformities, hiatal hernia and atherosclerosis. Electronically Signed   By: Jeb Levering M.D.   On: 03/24/2016 00:36     Assessment/Plan   ICD-9-CM ICD-10-CM   1. Physical deconditioning 799.3 R53.81   2. Chronic pain syndrome 338.4 G89.4   3. Urinary retention 788.20 R33.9   4. Primary osteoarthritis involving multiple joints 715.09 M15.0   5. Essential hypertension 401.9 I10   6. Aspiration pneumonia of right lower lobe, unspecified aspiration pneumonia type ([place]) 507.0 J69.0     Voiding trial - if inadequate urine output in 8hrs will need to reinsert foley and will need urology eval  PT/OT/ST as ordered  Finish abx   Cont current meds as ordered. Oxy ER needs to be given q8hrs  Check CBC and BMP  Nutritional supplements as indicated  GOAL: short term rehab and d/c home when medically appropriate. Communicated with pt and nursing.  Will follow  Halford Goetzke S. Perlie Gold  [facility] [address] [place], [place] [address] [phone] Cell (Monday-Friday 8 AM - 5 PM) [phone] After 5 PM and follow prompts

## 2016-04-04 LAB — BASIC METABOLIC PANEL
BUN: 15 mg/dL (ref 4–21)
Creatinine: 0.6 mg/dL (ref 0.5–1.1)
GLUCOSE: 101 mg/dL
POTASSIUM: 4.9 mmol/L (ref 3.4–5.3)
SODIUM: 137 mmol/L (ref 137–147)

## 2016-04-04 LAB — CBC AND DIFFERENTIAL
HEMATOCRIT: 39 % (ref 36–46)
HEMOGLOBIN: 11.4 g/dL — AB (ref 12.0–16.0)
Platelets: 538 10*3/uL — AB (ref 150–399)
WBC: 7.7 10*3/mL

## 2016-04-20 ENCOUNTER — Encounter (HOSPITAL_COMMUNITY): Payer: Self-pay

## 2016-04-20 ENCOUNTER — Emergency Department (HOSPITAL_COMMUNITY): Payer: Medicare Other

## 2016-04-20 ENCOUNTER — Inpatient Hospital Stay (HOSPITAL_COMMUNITY)
Admission: EM | Admit: 2016-04-20 | Discharge: 2016-04-26 | DRG: 871 | Disposition: A | Discharge status: Skilled Nursing Facility | Payer: Medicare Other | Attending: Family Medicine | Admitting: Family Medicine

## 2016-04-20 DIAGNOSIS — R0902 Hypoxemia: Secondary | ICD-10-CM

## 2016-04-20 DIAGNOSIS — G894 Chronic pain syndrome: Secondary | ICD-10-CM | POA: Diagnosis present

## 2016-04-20 DIAGNOSIS — R0602 Shortness of breath: Secondary | ICD-10-CM | POA: Diagnosis not present

## 2016-04-20 DIAGNOSIS — E43 Unspecified severe protein-calorie malnutrition: Secondary | ICD-10-CM | POA: Diagnosis present

## 2016-04-20 DIAGNOSIS — K219 Gastro-esophageal reflux disease without esophagitis: Secondary | ICD-10-CM | POA: Diagnosis present

## 2016-04-20 DIAGNOSIS — E876 Hypokalemia: Secondary | ICD-10-CM | POA: Diagnosis not present

## 2016-04-20 DIAGNOSIS — D72829 Elevated white blood cell count, unspecified: Secondary | ICD-10-CM | POA: Diagnosis present

## 2016-04-20 DIAGNOSIS — M479 Spondylosis, unspecified: Secondary | ICD-10-CM | POA: Diagnosis present

## 2016-04-20 DIAGNOSIS — T368X5A Adverse effect of other systemic antibiotics, initial encounter: Secondary | ICD-10-CM | POA: Diagnosis not present

## 2016-04-20 DIAGNOSIS — R000 Tachycardia, unspecified: Secondary | ICD-10-CM | POA: Diagnosis present

## 2016-04-20 DIAGNOSIS — Z881 Allergy status to other antibiotic agents status: Secondary | ICD-10-CM

## 2016-04-20 DIAGNOSIS — I10 Essential (primary) hypertension: Secondary | ICD-10-CM | POA: Diagnosis present

## 2016-04-20 DIAGNOSIS — J690 Pneumonitis due to inhalation of food and vomit: Secondary | ICD-10-CM | POA: Diagnosis present

## 2016-04-20 DIAGNOSIS — Z7982 Long term (current) use of aspirin: Secondary | ICD-10-CM

## 2016-04-20 DIAGNOSIS — J189 Pneumonia, unspecified organism: Secondary | ICD-10-CM

## 2016-04-20 DIAGNOSIS — R110 Nausea: Secondary | ICD-10-CM | POA: Diagnosis not present

## 2016-04-20 DIAGNOSIS — J1100 Influenza due to unidentified influenza virus with unspecified type of pneumonia: Secondary | ICD-10-CM | POA: Diagnosis not present

## 2016-04-20 DIAGNOSIS — Z6823 Body mass index (BMI) 23.0-23.9, adult: Secondary | ICD-10-CM | POA: Diagnosis not present

## 2016-04-20 DIAGNOSIS — Z79891 Long term (current) use of opiate analgesic: Secondary | ICD-10-CM | POA: Diagnosis not present

## 2016-04-20 DIAGNOSIS — A419 Sepsis, unspecified organism: Secondary | ICD-10-CM | POA: Diagnosis not present

## 2016-04-20 DIAGNOSIS — R0600 Dyspnea, unspecified: Secondary | ICD-10-CM | POA: Diagnosis not present

## 2016-04-20 DIAGNOSIS — Z981 Arthrodesis status: Secondary | ICD-10-CM

## 2016-04-20 DIAGNOSIS — J111 Influenza due to unidentified influenza virus with other respiratory manifestations: Secondary | ICD-10-CM | POA: Diagnosis not present

## 2016-04-20 DIAGNOSIS — J1008 Influenza due to other identified influenza virus with other specified pneumonia: Secondary | ICD-10-CM | POA: Diagnosis present

## 2016-04-20 DIAGNOSIS — J9621 Acute and chronic respiratory failure with hypoxia: Secondary | ICD-10-CM | POA: Diagnosis present

## 2016-04-20 DIAGNOSIS — D509 Iron deficiency anemia, unspecified: Secondary | ICD-10-CM | POA: Diagnosis present

## 2016-04-20 DIAGNOSIS — J9601 Acute respiratory failure with hypoxia: Secondary | ICD-10-CM | POA: Diagnosis not present

## 2016-04-20 HISTORY — DX: Pneumonitis due to inhalation of food and vomit: J69.0

## 2016-04-20 LAB — CBC
HCT: 36.6 % (ref 36.0–46.0)
Hemoglobin: 11.4 g/dL — ABNORMAL LOW (ref 12.0–15.0)
MCH: 24.4 pg — AB (ref 26.0–34.0)
MCHC: 31.1 g/dL (ref 30.0–36.0)
MCV: 78.4 fL (ref 78.0–100.0)
PLATELETS: 755 10*3/uL — AB (ref 150–400)
RBC: 4.67 MIL/uL (ref 3.87–5.11)
RDW: 17.4 % — ABNORMAL HIGH (ref 11.5–15.5)
WBC: 19.6 10*3/uL — ABNORMAL HIGH (ref 4.0–10.5)

## 2016-04-20 LAB — COMPREHENSIVE METABOLIC PANEL
ALBUMIN: 2.7 g/dL — AB (ref 3.5–5.0)
ALT: 36 U/L (ref 14–54)
ANION GAP: 10 (ref 5–15)
AST: 47 U/L — ABNORMAL HIGH (ref 15–41)
Alkaline Phosphatase: 97 U/L (ref 38–126)
BUN: 13 mg/dL (ref 6–20)
CHLORIDE: 99 mmol/L — AB (ref 101–111)
CO2: 25 mmol/L (ref 22–32)
Calcium: 9 mg/dL (ref 8.9–10.3)
Creatinine, Ser: 0.7 mg/dL (ref 0.44–1.00)
GFR calc Af Amer: >60 mL/min (ref >60)
GFR calc non Af Amer: >60 mL/min (ref >60)
GLUCOSE: 142 mg/dL — AB (ref 65–99)
Potassium: 5.2 mmol/L — ABNORMAL HIGH (ref 3.5–5.1)
SODIUM: 134 mmol/L — AB (ref 135–145)
Total Bilirubin: 0.7 mg/dL (ref 0.3–1.2)
Total Protein: 7.8 g/dL (ref 6.5–8.1)

## 2016-04-20 LAB — TROPONIN I: Troponin I: 0.06 ng/mL (ref <0.03)

## 2016-04-20 LAB — I-STAT CG4 LACTIC ACID, ED: Lactic Acid, Venous: 1.77 mmol/L (ref 0.5–1.9)

## 2016-04-20 LAB — MRSA PCR SCREENING: MRSA by PCR: NEGATIVE

## 2016-04-20 MED ORDER — DEXTROSE 5 % IV SOLN
2.0000 g | Freq: Once | INTRAVENOUS | Status: AC
  Administered 2016-04-20: 2 g via INTRAVENOUS
  Filled 2016-04-20: qty 2

## 2016-04-20 MED ORDER — IPRATROPIUM-ALBUTEROL 0.5-2.5 (3) MG/3ML IN SOLN
3.0000 mL | RESPIRATORY_TRACT | Status: DC | PRN
  Administered 2016-04-21 – 2016-04-22 (×2): 3 mL via RESPIRATORY_TRACT
  Filled 2016-04-20 (×4): qty 3

## 2016-04-20 MED ORDER — SODIUM CHLORIDE 0.9% FLUSH
3.0000 mL | Freq: Two times a day (BID) | INTRAVENOUS | Status: DC
  Administered 2016-04-20 – 2016-04-25 (×5): 3 mL via INTRAVENOUS

## 2016-04-20 MED ORDER — PROMETHAZINE HCL 25 MG PO TABS
25.0000 mg | ORAL_TABLET | ORAL | Status: DC | PRN
  Administered 2016-04-21 – 2016-04-24 (×9): 25 mg via ORAL
  Filled 2016-04-20 (×9): qty 1

## 2016-04-20 MED ORDER — OXYCODONE HCL ER 10 MG PO T12A
40.0000 mg | EXTENDED_RELEASE_TABLET | Freq: Once | ORAL | Status: AC
Start: 2016-04-20 — End: 2016-04-20
  Administered 2016-04-20: 40 mg via ORAL
  Filled 2016-04-20: qty 4

## 2016-04-20 MED ORDER — SODIUM CHLORIDE 0.9 % IV SOLN
INTRAVENOUS | Status: DC
  Administered 2016-04-20 – 2016-04-25 (×5): via INTRAVENOUS

## 2016-04-20 MED ORDER — SODIUM CHLORIDE 0.9 % IV BOLUS (SEPSIS)
1000.0000 mL | Freq: Once | INTRAVENOUS | Status: AC
  Administered 2016-04-20: 1000 mL via INTRAVENOUS

## 2016-04-20 MED ORDER — SACCHAROMYCES BOULARDII 250 MG PO CAPS
250.0000 mg | ORAL_CAPSULE | Freq: Every day | ORAL | Status: DC
  Administered 2016-04-21 – 2016-04-26 (×5): 250 mg via ORAL
  Filled 2016-04-20 (×6): qty 1

## 2016-04-20 MED ORDER — PANTOPRAZOLE SODIUM 40 MG PO TBEC
40.0000 mg | DELAYED_RELEASE_TABLET | Freq: Two times a day (BID) | ORAL | Status: DC
  Administered 2016-04-20 – 2016-04-26 (×11): 40 mg via ORAL
  Filled 2016-04-20 (×12): qty 1

## 2016-04-20 MED ORDER — POLYETHYLENE GLYCOL 3350 17 G PO PACK
17.0000 g | PACK | Freq: Two times a day (BID) | ORAL | Status: DC
  Administered 2016-04-21 – 2016-04-25 (×3): 17 g via ORAL
  Filled 2016-04-20 (×8): qty 1

## 2016-04-20 MED ORDER — HEPARIN SODIUM (PORCINE) 5000 UNIT/ML IJ SOLN
5000.0000 [IU] | Freq: Three times a day (TID) | INTRAMUSCULAR | Status: DC
  Administered 2016-04-20 – 2016-04-26 (×16): 5000 [IU] via SUBCUTANEOUS
  Filled 2016-04-20 (×17): qty 1

## 2016-04-20 MED ORDER — ACETAMINOPHEN 325 MG PO TABS
650.0000 mg | ORAL_TABLET | Freq: Four times a day (QID) | ORAL | Status: DC | PRN
  Administered 2016-04-22: 650 mg via ORAL
  Filled 2016-04-20: qty 2

## 2016-04-20 MED ORDER — METOPROLOL TARTRATE 25 MG PO TABS
50.0000 mg | ORAL_TABLET | Freq: Two times a day (BID) | ORAL | Status: DC
  Administered 2016-04-20 – 2016-04-26 (×12): 50 mg via ORAL
  Filled 2016-04-20 (×13): qty 2

## 2016-04-20 MED ORDER — VANCOMYCIN HCL IN DEXTROSE 1-5 GM/200ML-% IV SOLN
1000.0000 mg | Freq: Once | INTRAVENOUS | Status: AC
  Administered 2016-04-20: 1000 mg via INTRAVENOUS
  Filled 2016-04-20: qty 200

## 2016-04-20 MED ORDER — ASPIRIN EC 81 MG PO TBEC
81.0000 mg | DELAYED_RELEASE_TABLET | Freq: Every day | ORAL | Status: DC
  Administered 2016-04-20 – 2016-04-25 (×6): 81 mg via ORAL
  Filled 2016-04-20 (×6): qty 1

## 2016-04-20 MED ORDER — ACETAMINOPHEN 325 MG PO TABS
650.0000 mg | ORAL_TABLET | Freq: Once | ORAL | Status: AC
  Administered 2016-04-20: 650 mg via ORAL
  Filled 2016-04-20: qty 2

## 2016-04-20 MED ORDER — PIPERACILLIN-TAZOBACTAM 3.375 G IVPB
3.3750 g | Freq: Once | INTRAVENOUS | Status: AC
  Administered 2016-04-20: 3.375 g via INTRAVENOUS
  Filled 2016-04-20: qty 50

## 2016-04-20 MED ORDER — OXYCODONE HCL ER 10 MG PO T12A
40.0000 mg | EXTENDED_RELEASE_TABLET | Freq: Three times a day (TID) | ORAL | Status: DC
  Administered 2016-04-20 – 2016-04-26 (×18): 40 mg via ORAL
  Filled 2016-04-20 (×18): qty 4

## 2016-04-20 MED ORDER — ACETAMINOPHEN 650 MG RE SUPP: 650.0000 mg | Freq: Four times a day (QID) | RECTAL | Status: DC | PRN

## 2016-04-20 MED ORDER — VANCOMYCIN HCL 500 MG IV SOLR: 500.0000 mg | Freq: Two times a day (BID) | INTRAVENOUS | Status: DC

## 2016-04-20 MED ORDER — DEXTROSE 5 % IV SOLN
1.0000 g | Freq: Two times a day (BID) | INTRAVENOUS | Status: DC
  Filled 2016-04-20: qty 1

## 2016-04-20 MED ORDER — PIPERACILLIN-TAZOBACTAM 3.375 G IVPB
3.3750 g | Freq: Three times a day (TID) | INTRAVENOUS | Status: DC
  Administered 2016-04-21 – 2016-04-24 (×10): 3.375 g via INTRAVENOUS
  Filled 2016-04-20 (×10): qty 50

## 2016-04-20 NOTE — ED Triage Notes (Signed)
PT RECEIVED FROM [facility] FOR SOB AND DRY COUGH. PER EMS, THE PT WAS SENT THERE FOR REHAB FOR ASPIRATION PNEUMONIAX3 WEEKS AGO. PT IS NORMALLY ON O2 AT 2L VIA [place] WITHSATS AT 90-92%. TODAY, THE PT HAD AN O2 SAT OF 80% ON 2L. THE FACILITY PLACED HER ON 5L AND SATS CAME UP  TO 90%. THE FACILITY SENT HER FPR A CHEST X-RAY. EMS ARRIVED WITH HER ON A NONREBREATHER MASK AT 8L, THEIR SAT WAS 95%. PT DENIES CHEST PAIN OR FEVER AT THIS TIME.

## 2016-04-20 NOTE — H&P (Signed)
History and Physical    [name] [id number] DOB: [birth year] DOA: 04/20/2016  PCP: [name], MD  Patient coming from: SNF  Chief Complaint: Hypoxia  HPI: [name] is a 83 y.o. female with medical history significant of esophagitis, GERD, chronic hypoxia on 2LNC at baseline, recent admission for aspiration pneumonia in 12/17 who presents from SNF after being found to be hypoxic to the 80's. On further questioning, patient was attending rehab on the afternoon of admission, when she suddenly became sob and was found to be hypoxic. Patient did report some R sided chest pains worse with inspiration that has since resolved at time of ED visit. Otherwise, patient denies syncope, nausea, or diaphoresis  ED Course: In the ED, patient noted to have tmax of 100.66F with tachycardia to the 120's. Presenting lactate of 1.77. WBC noted to be 19.6. Patient was started on empiric zosyn in the ED with 1L bolus given. Hospitalist consulted for consideration for admission.  Review of Systems:  Review of Systems  Constitutional: Positive for fever. Negative for diaphoresis and malaise/fatigue.  HENT: Negative for congestion, ear discharge, ear pain and nosebleeds.   Eyes: Negative for double vision, photophobia and pain.  Respiratory: Positive for shortness of breath. Negative for sputum production.   Cardiovascular: Positive for palpitations. Negative for orthopnea, claudication and leg swelling.  Gastrointestinal: Negative for abdominal pain, constipation and diarrhea.  Genitourinary: Negative for frequency and hematuria.  Musculoskeletal: Negative for back pain, joint pain and neck pain.  Neurological: Negative for tingling, tremors and loss of consciousness.  Psychiatric/Behavioral: Negative for hallucinations, memory loss and substance abuse. The patient does not have insomnia.     Past Medical History:  Diagnosis Date  . Anemia 10-05-12   hgb-9.0 on 09-14-12  . Arm vein blood clot  10-05-12   Rt. arm '12  . Arthritis 10-05-12   degenerative joint disease,scoliosis spine  . Dysrhythmia 10-05-12   tachycardia -tx. Lopressor  . GERD (gastroesophageal reflux disease)   . H/O esophagitis   . H/O hiatal hernia   . Tachycardia   . Transfusion history 10-05-12   4 units blood '12    Past Surgical History:  Procedure Laterality Date  . ABDOMINAL HYSTERECTOMY    . APPENDECTOMY     child  . BACK SURGERY  10-05-12   x5-last fusion with plates  . BREAST ENHANCEMENT SURGERY    . CATARACT EXTRACTION, BILATERAL    . COLONOSCOPY WITH PROPOFOL N/A 10/23/2012   Procedure: COLONOSCOPY WITH PROPOFOL;  Surgeon: Lear Ng, MD;  Location: [facility];  Service: Endoscopy;  Laterality: N/A;  . HOT HEMOSTASIS N/A 10/23/2012   Procedure: HOT HEMOSTASIS (ARGON PLASMA COAGULATION/BICAP);  Surgeon: Lear Ng, MD;  Location: [facility];  Service: Endoscopy;  Laterality: N/A;     reports that she has never smoked. She has never used smokeless tobacco. She reports that she does not drink alcohol or use drugs.  Allergies  Allergen Reactions  . Ciprofloxacin Rash    burning    Family History  Problem Relation Age of Onset  . Cancer Mother   . Cancer Father   . Diabetes Sister   . Stroke Neg Hx   . CAD Neg Hx     Prior to Admission medications   Medication Sig Start Date End Date Taking? Authorizing Provider  acetaminophen (TYLENOL) 325 MG tablet Take 650 mg by mouth every 4 (four) hours as needed for fever.   Yes Historical Provider, MD  aspirin 81  MG tablet Take 81 mg by mouth at bedtime.    Yes Historical Provider, MD  ipratropium-albuterol (DUONEB) 0.5-2.5 (3) MG/3ML SOLN Take 3 mLs by nebulization every 4 (four) hours as needed (shortness of breath/wheezing).   Yes Historical Provider, MD  metoprolol (LOPRESSOR) 50 MG tablet Take 50 mg by mouth 2 (two) times daily.   Yes Historical Provider, MD  Multiple Vitamin (MULTIVITAMIN WITH MINERALS) TABS tablet Take 1  tablet by mouth daily.   Yes Historical Provider, MD  oxyCODONE (OXYCONTIN) 40 mg 12 hr tablet Take 40 mg by mouth every 8 (eight) hours.    Yes Historical Provider, MD  pantoprazole (PROTONIX) 40 MG tablet Take 40 mg by mouth 2 (two) times daily.   Yes Historical Provider, MD  polyethylene glycol (MIRALAX / GLYCOLAX) packet Take 17 g by mouth 2 (two) times daily. 03/26/16  Yes Charlynne Cousins, MD  promethazine (PHENERGAN) 25 MG tablet Take 25 mg by mouth every 4 (four) hours as needed for nausea or vomiting.   Yes Historical Provider, MD  saccharomyces boulardii (FLORASTOR) 250 MG capsule Take 250 mg by mouth daily.   Yes Historical Provider, MD    Physical Exam: Vitals:   04/20/16 1421 04/20/16 1507 04/20/16 1523 04/20/16 1652  BP: 122/72 140/88  149/85  Pulse: (!) 122 116  (!) 125  Resp: 18 (!) 29  22  Temp: 97.5 F (36.4 C)  100.3 F (37.9 C)   TempSrc: Axillary  Rectal   SpO2: 96% (!) 86%  93%  Weight:      Height:        Constitutional: NAD, calm, comfortable Vitals:   04/20/16 1421 04/20/16 1507 04/20/16 1523 04/20/16 1652  BP: 122/72 140/88  149/85  Pulse: (!) 122 116  (!) 125  Resp: 18 (!) 29  22  Temp: 97.5 F (36.4 C)  100.3 F (37.9 C)   TempSrc: Axillary  Rectal   SpO2: 96% (!) 86%  93%  Weight:      Height:       Eyes: PERRL, lids and conjunctivae normal ENMT: Mucous membranes are moist. Posterior pharynx clear of any exudate or lesions.Normal dentition.  Neck: normal, supple, no masses, no thyromegaly Respiratory: clear to auscultation bilaterally, no wheezing, no crackles. Normal respiratory effort. No accessory muscle use.  Cardiovascular: Regular rate and rhythm, Abdomen: no tenderness, no masses palpated. No hepatosplenomegaly. Bowel sounds positive.  Musculoskeletal: no clubbing / cyanosis. No joint deformity upper and lower extremities. Good ROM, no contractures. Normal muscle tone.  Skin: no rashes, lesions, ulcers. No induration Neurologic: CN  2-12 grossly intact. Sensation intact, DTR normal. Strength 5/5 in all 4.  Psychiatric: Normal judgment and insight. Alert and oriented x 3. Normal mood.    Labs on Admission: I have personally reviewed following labs and imaging studies  CBC:  Recent Labs Lab 04/20/16 1453  WBC 19.6*  HGB 11.4*  HCT 36.6  MCV 78.4  PLT [id number]*   Basic Metabolic Panel:  Recent Labs Lab 04/20/16 1453  NA 134*  K 5.2*  CL 99*  CO2 25  GLUCOSE 142*  BUN 13  CREATININE 0.70  CALCIUM 9.0   GFR: Estimated Creatinine Clearance: 45.6 mL/min (by C-G formula based on SCr of 0.7 mg/dL). Liver Function Tests:  Recent Labs Lab 04/20/16 1453  AST 47*  ALT 36  ALKPHOS 97  BILITOT 0.7  PROT 7.8  ALBUMIN 2.7*   No results for input(s): LIPASE, AMYLASE in the last 168 hours. No results  for input(s): AMMONIA in the last 168 hours. Coagulation Profile: No results for input(s): INR, PROTIME in the last 168 hours. Cardiac Enzymes:  Recent Labs Lab 04/20/16 1453  TROPONINI 0.06*   BNP (last 3 results) No results for input(s): PROBNP in the last 8760 hours. HbA1C: No results for input(s): HGBA1C in the last 72 hours. CBG: No results for input(s): GLUCAP in the last 168 hours. Lipid Profile: No results for input(s): CHOL, HDL, LDLCALC, TRIG, CHOLHDL, LDLDIRECT in the last 72 hours. Thyroid Function Tests: No results for input(s): TSH, T4TOTAL, FREET4, T3FREE, THYROIDAB in the last 72 hours. Anemia Panel: No results for input(s): VITAMINB12, FOLATE, FERRITIN, TIBC, IRON, RETICCTPCT in the last 72 hours. Urine analysis:    Component Value Date/Time   COLORURINE YELLOW 03/23/2016 2210   APPEARANCEUR CLEAR 03/23/2016 2210   LABSPEC 1.009 03/23/2016 2210   PHURINE 7.0 03/23/2016 2210   GLUCOSEU NEGATIVE 03/23/2016 2210   HGBUR NEGATIVE 03/23/2016 2210   BILIRUBINUR NEGATIVE 03/23/2016 2210   KETONESUR NEGATIVE 03/23/2016 2210   PROTEINUR NEGATIVE 03/23/2016 2210   UROBILINOGEN 0.2  01/26/2014 2329   NITRITE NEGATIVE 03/23/2016 2210   LEUKOCYTESUR NEGATIVE 03/23/2016 2210   Sepsis Labs: !!!!!!!!!!!!!!!!!!!!!!!!!!!!!!!!!!!!!!!!!!!! @LABRCNTIP (procalcitonin:4,lacticidven:4) )No results found for this or any previous visit (from the past 240 hour(s)).   Radiological Exams on Admission: Dg Chest 2 View  Result Date: 04/20/2016 CLINICAL DATA:  Shortness of breath. EXAM: CHEST  2 VIEW COMPARISON:  Two-view chest x-ray 03/28/2016. FINDINGS: The heart size is normal. Lung volumes are low. New by lateral lower lobe airspace disease is present, left greater than right. Bilateral pleural effusions are present. The upper lung fields are clear. The visualized soft tissues and bony thorax are unremarkable. IMPRESSION: 1. Bilateral lower lobe pneumonia. 2. Bilateral pleural effusions. Electronically Signed   By: San Morelle M.D.   On: 04/20/2016 14:38    EKG: Independently reviewed. Sinus tach  Assessment/Plan Principal Problem:   Sepsis ([place]) Active Problems:   Leukocytosis   Sinus tachycardia   Essential hypertension   Aspiration pneumonia ([facility])   Protein-calorie malnutrition, severe ([place])   1. Suspected recurrent aspiration pneumonia with sepsis present on admission 1. CXR reviewed. Bilateral PNA seen on imaging 2. Pt presents with fevers, tachycardia 3. Prior records reviewed. Patient recently admitted for aspiration PNA with slp recs for dysphagia 3 and thin liquids 4. MRSA nasal swab from one month ago was neg for MRSA 5. Suspect recurrent aspiration PNA 6. Agree with empiric zosyn monotherapy for now 7. Check pan-cultures 8. Will consult SLP 9. Admit to med-tele, inpt 2. Leukocytosis 1. Likely secondary to recurrent aspiration pneumonia 2. Continue abx per above 3. Will repeat cbc in AM 3. Sinus tach 1. Likely secondary to sepsis 2. Cont to monitor on telemetry 4. HTN 1. BP stable 2. Cont home regimen 5. Severe protein calorie  malnutrition 1. Appears stable 2. Encourage PO as able  DVT prophylaxis: heparin subq  Code Status: Full Family Communication: Pt in room  Disposition Plan: Uncertain at this time  Consults called:  Admission status: Inpatient as patient's septic presentation will likely require more than two midnight stay with medical treatment   CHIU, Orpah Melter MD [facility] Pager [phone]  If 7PM-7AM, please contact night-coverage [email or web address] Password [email or web address]  04/20/2016, 5:17 PM

## 2016-04-20 NOTE — ED Provider Notes (Addendum)
[facility] DEPT Provider Note   CSN: [id number] Arrival date & time: 04/20/16  1358     History   Chief Complaint Chief Complaint  Patient presents with  . Shortness of Breath    HPI [name] is a 83 y.o. female.  HPI Brought from nursing home for increasing SOB and cough.  Normally on 2 liters [place], but found today to be 80% on 2 liters, therefore placed on 5 liters and brought to ER. Pt feels better on 02.  No chest pain. No LE swelling. No hx of CHF. Reports nonproductive cough. Pt is a retired Immunologist. Denies aspirating on her food. No abdominal pain. Denies vomiting and diarrhea    Past Medical History:  Diagnosis Date  . Anemia 10-05-12   hgb-9.0 on 09-14-12  . Arm vein blood clot 10-05-12   Rt. arm '12  . Arthritis 10-05-12   degenerative joint disease,scoliosis spine  . Dysrhythmia 10-05-12   tachycardia -tx. Lopressor  . GERD (gastroesophageal reflux disease)   . H/O esophagitis   . H/O hiatal hernia   . Tachycardia   . Transfusion history 10-05-12   4 units blood '12    Patient Active Problem List   Diagnosis Date Noted  . Sepsis ([place]) 03/24/2016  . Aspiration pneumonia ([place]) 03/24/2016  . Urinary retention 03/24/2016  . Hyperglycemia 03/24/2016  . Protein-calorie malnutrition, severe ([place]) 03/24/2016  . Leukocytosis 01/27/2014  . Sinus tachycardia 01/27/2014  . Essential hypertension 01/27/2014  . Colitis 01/24/2014  . Iron deficiency anemia 10/23/2012  . Benign neoplasm of colon 10/23/2012    Past Surgical History:  Procedure Laterality Date  . ABDOMINAL HYSTERECTOMY    . APPENDECTOMY     child  . BACK SURGERY  10-05-12   x5-last fusion with plates  . BREAST ENHANCEMENT SURGERY    . CATARACT EXTRACTION, BILATERAL    . COLONOSCOPY WITH PROPOFOL N/A 10/23/2012   Procedure: COLONOSCOPY WITH PROPOFOL;  Surgeon: Lear Ng, MD;  Location: [facility];  Service: Endoscopy;  Laterality: N/A;  . HOT HEMOSTASIS N/A 10/23/2012   Procedure: HOT  HEMOSTASIS (ARGON PLASMA COAGULATION/BICAP);  Surgeon: Lear Ng, MD;  Location: [facility];  Service: Endoscopy;  Laterality: N/A;    OB History    No data available       Home Medications    Prior to Admission medications   Medication Sig Start Date End Date Taking? Authorizing Provider  acetaminophen (TYLENOL) 325 MG tablet Take 650 mg by mouth every 4 (four) hours as needed for fever.   Yes Historical Provider, MD  aspirin 81 MG tablet Take 81 mg by mouth at bedtime.    Yes Historical Provider, MD  ipratropium-albuterol (DUONEB) 0.5-2.5 (3) MG/3ML SOLN Take 3 mLs by nebulization every 4 (four) hours as needed (shortness of breath/wheezing).   Yes Historical Provider, MD  metoprolol (LOPRESSOR) 50 MG tablet Take 50 mg by mouth 2 (two) times daily.   Yes Historical Provider, MD  Multiple Vitamin (MULTIVITAMIN WITH MINERALS) TABS tablet Take 1 tablet by mouth daily.   Yes Historical Provider, MD  oxyCODONE (OXYCONTIN) 40 mg 12 hr tablet Take 40 mg by mouth every 8 (eight) hours.    Yes Historical Provider, MD  pantoprazole (PROTONIX) 40 MG tablet Take 40 mg by mouth 2 (two) times daily.   Yes Historical Provider, MD  polyethylene glycol (MIRALAX / GLYCOLAX) packet Take 17 g by mouth 2 (two) times daily. 03/26/16  Yes Charlynne Cousins, MD  promethazine [facility])  25 MG tablet Take 25 mg by mouth every 4 (four) hours as needed for nausea or vomiting.   Yes Historical Provider, MD  saccharomyces boulardii (FLORASTOR) 250 MG capsule Take 250 mg by mouth daily.   Yes Historical Provider, MD    Family History Family History  Problem Relation Age of Onset  . Cancer Mother   . Cancer Father   . Diabetes Sister   . Stroke Neg Hx   . CAD Neg Hx     Social History Social History  Substance Use Topics  . Smoking status: Never Smoker  . Smokeless tobacco: Never Used  . Alcohol use No     Allergies   Ciprofloxacin   Review of Systems Review of Systems  All other  systems reviewed and are negative.    Physical Exam Updated Vital Signs BP 140/88 (BP Location: Left Arm)   Pulse 116   Temp 97.5 F (36.4 C) (Axillary)   Resp (!) 29   Ht 5\' 3"  (1.6 m)   Wt 135 lb (61.2 kg)   SpO2 (!) 86%   BMI 23.91 kg/m   Physical Exam  Constitutional: She is oriented to person, place, and time. She appears well-developed and well-nourished. No distress.  HENT:  Head: Normocephalic and atraumatic.  Eyes: EOM are normal.  Neck: Normal range of motion.  Cardiovascular: Normal rate, regular rhythm and normal heart sounds.   Pulmonary/Chest: Effort normal.  Rhonchi bilaterally  Abdominal: Soft. She exhibits no distension. There is no tenderness.  Musculoskeletal: Normal range of motion.  Neurological: She is alert and oriented to person, place, and time.  Skin: Skin is warm and dry.  Psychiatric: She has a normal mood and affect. Judgment normal.  Nursing note and vitals reviewed.    ED Treatments / Results  Labs (all labs ordered are listed, but only abnormal results are displayed) Labs Reviewed  CBC - Abnormal; Notable for the following:       Result Value   WBC 19.6 (*)    Hemoglobin 11.4 (*)    MCH 24.4 (*)    RDW 17.4 (*)    Platelets 755 (*)    All other components within normal limits  COMPREHENSIVE METABOLIC PANEL - Abnormal; Notable for the following:    Sodium 134 (*)    Potassium 5.2 (*)    Chloride 99 (*)    Glucose, Bld 142 (*)    Albumin 2.7 (*)    AST 47 (*)    All other components within normal limits  TROPONIN I - Abnormal; Notable for the following:    Troponin I 0.06 (*)    All other components within normal limits  CULTURE, BLOOD (ROUTINE X 2)  CULTURE, BLOOD (ROUTINE X 2)  I-STAT CG4 LACTIC ACID, ED    EKG  EKG Interpretation  Date/Time:  Wednesday April 20 2016 14:18:43 EST Ventricular Rate:  125 PR Interval:    QRS Duration: 77 QT Interval:  285 QTC Calculation: 411 R Axis:   -34 Text Interpretation:   Sinus tachycardia Left ventricular hypertrophy Inferior infarct, old Anterior infarct, old Lateral leads are also involved tachycardia, otherwise similar to previous Confirmed by Johnney Killian, MD, Jeannie Done [id number]) on 04/20/2016 2:26:43 PM       Radiology Dg Chest 2 View  Result Date: 04/20/2016 CLINICAL DATA:  Shortness of breath. EXAM: CHEST  2 VIEW COMPARISON:  Two-view chest x-ray 03/28/2016. FINDINGS: The heart size is normal. Lung volumes are low. New by lateral lower lobe airspace disease  is present, left greater than right. Bilateral pleural effusions are present. The upper lung fields are clear. The visualized soft tissues and bony thorax are unremarkable. IMPRESSION: 1. Bilateral lower lobe pneumonia. 2. Bilateral pleural effusions. Electronically Signed   By: San Morelle M.D.   On: 04/20/2016 14:38    Procedures Procedures (including critical care time)  Medications Ordered in ED Medications  sodium chloride 0.9 % bolus 1,000 mL (not administered)  ceFEPIme (MAXIPIME) 2 g in dextrose 5 % 50 mL IVPB (not administered)  vancomycin (VANCOCIN) IVPB 1000 mg/200 mL premix (not administered)     Initial Impression / Assessment and Plan / ED Course  I have reviewed the triage vital signs and the nursing notes.  Pertinent labs & imaging results that were available during my care of the patient were reviewed by me and considered in my medical decision making (see chart for details).  Clinical Course     New PNA and oxygen requirement. Will admit. vanc and cefepime now. Lactate and blood cultures now. Tachycardia noted. HR elevated. Will give 1 liter fluid bolus at this time. SIRS  Final Clinical Impressions(s) / ED Diagnoses   Final diagnoses:  HCAP (healthcare-associated pneumonia)  Hypoxia    New Prescriptions New Prescriptions   No medications on file     Jola Schmidt, MD 04/20/16 [place], MD 04/20/16 [phone]

## 2016-04-20 NOTE — ED Notes (Signed)
Blood cultures collected at 15:31

## 2016-04-20 NOTE — ED Notes (Signed)
Patient transported to X-ray 

## 2016-04-20 NOTE — Progress Notes (Signed)
Pharmacy Antibiotic Note  [name] is a 83 y.o. female admitted on 04/20/2016 with pneumonia.   She was recently admitted to hospital (03/23/16>>03/26/16) with aspiration PNA and discharged to rehab facility post-admission.  She was sent from facility to ED today with hypoxia/ increased O2 requirements. CXR + BLL PNA.    Pharmacy originally consulted for Vancomycin & Cefepime dosing.  Admitting MD adjusting abx to Zosyn per pharmacy for recurrent aspiration pneumonia.  Renal function at patient's baseline.  Scr=0.7, estimated CrCl ~73ml/min (CG).    Plan: Zosyn 3.375g IV q8h (4 hour infusion time).  Renal function at baseline.  Need for further dosage adjustment appears unlikely at present. Will sign off at this time.  Please reconsult if a change in clinical status warrants re-evaluation of dosage.  Height: 5\' 3"  (160 cm) Weight: 135 lb (61.2 kg) IBW/kg (Calculated) : 52.4  Temp (24hrs), Avg:98.9 F (37.2 C), Min:97.5 F (36.4 C), Max:100.3 F (37.9 C)   Recent Labs Lab 04/20/16 1453 04/20/16 1542  WBC 19.6*  --   CREATININE 0.70  --   LATICACIDVEN  --  1.77    Estimated Creatinine Clearance: 45.6 mL/min (by C-G formula based on SCr of 0.7 mg/dL).    Allergies  Allergen Reactions  . Ciprofloxacin Rash    burning    Antimicrobials this admission: 1/3 Vancomycin >> 1/3 1/3 Cefepime >> 1/3 1/3 Zosyn >>  Dose adjustments this admission:  Microbiology results: 1/3 BCx: ordered  Thank you for allowing pharmacy to be a part of this patient's care.  Hershal Coria, PharmD, BCPS Pager: [phone] 04/20/2016 6:12 PM

## 2016-04-20 NOTE — ED Notes (Signed)
Pt's daughter/POA Lattie Haw (pt calls her Jeannie Done) lives far away and faxed in information to verify the fact that she's the power of attorney.  Paperwork will be kept with patient. Number: [phone]; Son-in-law: [phone]

## 2016-04-20 NOTE — ED Notes (Signed)
Nurse is in the room starting an IV and collecting labs

## 2016-04-20 NOTE — ED Notes (Signed)
Bed: [id number] Expected date:  Expected time:  Means of arrival:  Comments: EMS-low O2

## 2016-04-20 NOTE — Progress Notes (Signed)
Pharmacy Antibiotic Note  [name] is a 83 y.o. female admitted on 04/20/2016 with pneumonia.   She was recently admitted to hospital (03/23/16>>03/26/16) with aspiration PNA and discharged to rehab facility post-admission.  She was sent from facility to ED today with hypoxia/ increased O2 requirements. CXR + BLL PNA.    Pharmacy has been consulted for Vancomycin & Cefepime dosing. Renal function at patient's baseline.  Scr=0.7, estimated CrCl ~51ml/min (CG).    Plan: Cefepime 2gm IV x1 now then 1gm IV q12h Vancomycin 1gm IV x1 then 500mg  IV q12h Check Vancomycin trough at steady state Monitor renal function and cx data  Consider check MRSA PCR  Height: 5\' 3"  (160 cm) Weight: 135 lb (61.2 kg) IBW/kg (Calculated) : 52.4  Temp (24hrs), Avg:97.5 F (36.4 C), Min:97.5 F (36.4 C), Max:97.5 F (36.4 C)   Recent Labs Lab 04/20/16 1453  WBC 19.6*    CrCl cannot be calculated (Patient's most recent lab result is older than the maximum 21 days allowed.).    Allergies  Allergen Reactions  . Ciprofloxacin Rash    burning    Antimicrobials this admission: 1/3 Vancomycin >>  1/3 Cefepime >>   Dose adjustments this admission:  Microbiology results: 1/3 BCx: ordered  Thank you for allowing pharmacy to be a part of this patient's care.  Netta Cedars, PharmD, BCPS Pager: [phone] 04/20/2016 3:08 PM

## 2016-04-21 ENCOUNTER — Encounter (HOSPITAL_COMMUNITY): Payer: Self-pay

## 2016-04-21 DIAGNOSIS — A419 Sepsis, unspecified organism: Principal | ICD-10-CM

## 2016-04-21 DIAGNOSIS — J1100 Influenza due to unidentified influenza virus with unspecified type of pneumonia: Secondary | ICD-10-CM

## 2016-04-21 LAB — INFLUENZA PANEL BY PCR (TYPE A & B)
INFLBPCR: NEGATIVE
Influenza A By PCR: POSITIVE — AB

## 2016-04-21 LAB — COMPREHENSIVE METABOLIC PANEL
ALT: 27 U/L (ref 14–54)
ANION GAP: 10 (ref 5–15)
AST: 28 U/L (ref 15–41)
Albumin: 2.3 g/dL — ABNORMAL LOW (ref 3.5–5.0)
Alkaline Phosphatase: 79 U/L (ref 38–126)
BUN: 11 mg/dL (ref 6–20)
CHLORIDE: 102 mmol/L (ref 101–111)
CO2: 22 mmol/L (ref 22–32)
Calcium: 8.3 mg/dL — ABNORMAL LOW (ref 8.9–10.3)
Creatinine, Ser: 0.6 mg/dL (ref 0.44–1.00)
Glucose, Bld: 104 mg/dL — ABNORMAL HIGH (ref 65–99)
POTASSIUM: 3.9 mmol/L (ref 3.5–5.1)
Sodium: 134 mmol/L — ABNORMAL LOW (ref 135–145)
Total Bilirubin: 0.5 mg/dL (ref 0.3–1.2)
Total Protein: 6.2 g/dL — ABNORMAL LOW (ref 6.5–8.1)

## 2016-04-21 LAB — CBC
HEMATOCRIT: 30.9 % — AB (ref 36.0–46.0)
Hemoglobin: 9.3 g/dL — ABNORMAL LOW (ref 12.0–15.0)
MCH: 23.2 pg — ABNORMAL LOW (ref 26.0–34.0)
MCHC: 30.1 g/dL (ref 30.0–36.0)
MCV: 77.1 fL — ABNORMAL LOW (ref 78.0–100.0)
Platelets: 666 10*3/uL — ABNORMAL HIGH (ref 150–400)
RBC: 4.01 MIL/uL (ref 3.87–5.11)
RDW: 17.4 % — ABNORMAL HIGH (ref 11.5–15.5)
WBC: 12.1 10*3/uL — AB (ref 4.0–10.5)

## 2016-04-21 LAB — HIV ANTIBODY (ROUTINE TESTING W REFLEX): HIV SCREEN 4TH GENERATION: NONREACTIVE

## 2016-04-21 MED ORDER — OSELTAMIVIR PHOSPHATE 75 MG PO CAPS: 75.0000 mg | ORAL_CAPSULE | Freq: Two times a day (BID) | ORAL | Status: DC

## 2016-04-21 MED ORDER — OSELTAMIVIR PHOSPHATE 30 MG PO CAPS
30.0000 mg | ORAL_CAPSULE | Freq: Two times a day (BID) | ORAL | Status: AC
  Administered 2016-04-21 – 2016-04-25 (×10): 30 mg via ORAL
  Filled 2016-04-21 (×10): qty 1

## 2016-04-21 NOTE — Progress Notes (Signed)
BSE completed.  Full report to follow.   Recommend continue dys3/thin.   Luanna Salk, [facility] SLP [phone]

## 2016-04-21 NOTE — Progress Notes (Signed)
PROGRESS NOTE  [name]  [id number] DOB: [birth year] DOA: 04/20/2016 PCP: [name], MD   Brief Narrative: [name] is a 83 y.o. female with a history of recent admission for aspiration pneumonia in 12/17, discharged on oxygen, who presented from SNF after being found to be hypoxic to the 80's. On arrival that patient had no specific complaints, but was noted to have Tmax of 100.57F with tachycardia to the 120's. Presenting lactate of 1.77. WBC noted to be 19.6. CXR demonstrated evidence of bilateral infiltrates. Patient was started on empiric zosyn in the ED with 1L bolus given. She was admitted for further management of hypoxemic respiratory failure due to presumed recurrent aspiration pneumonia. Flu PCR was positive and tamiflu was started. She has improved since admission, still requiring oxygen.  Assessment & Plan: Principal Problem:   Sepsis ([place]) Active Problems:   Leukocytosis   Sinus tachycardia   Essential hypertension   Aspiration pneumonia (H[facility])   Protein-calorie malnutrition, severe ([facility])  Sepsis due to influenza and suspected recurrent aspiration pneumonia: CXR showed bilateral infiltrates, pt febrile with tachycardia.  - Continue dysphagia III diet with thin liquids per SLP reevaluation 01/04 - Cover anaerobic respiratory pathogens with zosyn - Continue tamiflu x5 days (01/04 - 01/08) - Monitor blood and sputum cultures - Leukocytosis improving - Will notify facility of +flu  HTN: Chronic, stable.  - Continue home medications  Severe protein calorie malnutrition: chronic, stable.  - Continue to encourage po.  DVT prophylaxis: Heparin Code Status: Full Family Communication: Discussed with daughter Comptroller) by phone today Disposition Plan: Continue inpatient management, anticipate DC to SNF.   Consultants:   None  Procedures:   None  Antimicrobials:  Vanc/cefepime 01/03  Zosyn 01/03 >>   Tamiflu 01/04 - 01/08   Subjective: Pt without  complaints, feels less weak/"sick" than at admission. Denies cough, chest pain, dyspnea, abd pain, N/V/D, dysuria.   Objective: Vitals:   04/20/16 1832 04/20/16 2016 04/20/16 2037 04/21/16 0552  BP: 117/63 138/77 133/72 134/84  Pulse: 98 100 76 (!) 102  Resp: 24 21 (!) 22 20  Temp:   97.4 F (36.3 C) 98.6 F (37 C)  TempSrc:   Oral Oral  SpO2: 93% 97% 97% 94%  Weight:      Height:        Intake/Output Summary (Last 24 hours) at 04/21/16 1256 Last data filed at 04/21/16 0900  Gross per 24 hour  Intake             2415 ml  Output                0 ml  Net             2415 ml   Filed Weights   04/20/16 1420  Weight: 61.2 kg (135 lb)    Examination: General exam: Pleasant, elderly female in no distress  Respiratory system: Non-labored breathing 5L by [place]. Clear to auscultation bilaterally.  Cardiovascular system: Regular rate and rhythm. No murmur, rub, or gallop. No JVD, and no pedal edema. Gastrointestinal system: Abdomen soft, non-tender, non-distended, with normoactive bowel sounds. No organomegaly or masses felt. Central nervous system: Alert and oriented. No focal neurological deficits. Extremities: Warm, no deformities Skin: No rashes, lesions no ulcers Psychiatry: Judgement and insight appear normal. Mood & affect appropriate.   Data Reviewed: I have personally reviewed following labs and imaging studies  CBC:  Recent Labs Lab 04/20/16 1453 04/21/16 0557  WBC 19.6* 12.1*  HGB 11.4*  9.3*  HCT 36.6 30.9*  MCV 78.4 77.1*  PLT 755* [id number]*   Basic Metabolic Panel:  Recent Labs Lab 04/20/16 1453 04/21/16 0557  NA 134* 134*  K 5.2* 3.9  CL 99* 102  CO2 25 22  GLUCOSE 142* 104*  BUN 13 11  CREATININE 0.70 0.60  CALCIUM 9.0 8.3*   GFR: Estimated Creatinine Clearance: 45.6 mL/min (by C-G formula based on SCr of 0.6 mg/dL). Liver Function Tests:  Recent Labs Lab 04/20/16 1453 04/21/16 0557  AST 47* 28  ALT 36 27  ALKPHOS 97 79  BILITOT 0.7 0.5    PROT 7.8 6.2*  ALBUMIN 2.7* 2.3*   No results for input(s): LIPASE, AMYLASE in the last 168 hours. No results for input(s): AMMONIA in the last 168 hours. Coagulation Profile: No results for input(s): INR, PROTIME in the last 168 hours. Cardiac Enzymes:  Recent Labs Lab 04/20/16 1453  TROPONINI 0.06*   BNP (last 3 results) No results for input(s): PROBNP in the last 8760 hours. HbA1C: No results for input(s): HGBA1C in the last 72 hours. CBG: No results for input(s): GLUCAP in the last 168 hours. Lipid Profile: No results for input(s): CHOL, HDL, LDLCALC, TRIG, CHOLHDL, LDLDIRECT in the last 72 hours. Thyroid Function Tests: No results for input(s): TSH, T4TOTAL, FREET4, T3FREE, THYROIDAB in the last 72 hours. Anemia Panel: No results for input(s): VITAMINB12, FOLATE, FERRITIN, TIBC, IRON, RETICCTPCT in the last 72 hours. Urine analysis:    Component Value Date/Time   COLORURINE YELLOW 03/23/2016 2210   APPEARANCEUR CLEAR 03/23/2016 2210   LABSPEC 1.009 03/23/2016 2210   PHURINE 7.0 03/23/2016 2210   GLUCOSEU NEGATIVE 03/23/2016 2210   HGBUR NEGATIVE 03/23/2016 2210   BILIRUBINUR NEGATIVE 03/23/2016 2210   KETONESUR NEGATIVE 03/23/2016 2210   PROTEINUR NEGATIVE 03/23/2016 2210   UROBILINOGEN 0.2 01/26/2014 2329   NITRITE NEGATIVE 03/23/2016 2210   LEUKOCYTESUR NEGATIVE 03/23/2016 2210   Sepsis Labs: @LABRCNTIP (procalcitonin:4,lacticidven:4)  ) Recent Results (from the past 240 hour(s))  MRSA PCR Screening     Status: None   Collection Time: 04/20/16  9:52 PM  Result Value Ref Range Status   MRSA by PCR NEGATIVE NEGATIVE Final    Comment:        The GeneXpert MRSA Assay (FDA approved for NASAL specimens only), is one component of a comprehensive MRSA colonization surveillance program. It is not intended to diagnose MRSA infection nor to guide or monitor treatment for MRSA infections.      Radiology Studies: Dg Chest 2 View  Result Date:  04/20/2016 CLINICAL DATA:  Shortness of breath. EXAM: CHEST  2 VIEW COMPARISON:  Two-view chest x-ray 03/28/2016. FINDINGS: The heart size is normal. Lung volumes are low. New by lateral lower lobe airspace disease is present, left greater than right. Bilateral pleural effusions are present. The upper lung fields are clear. The visualized soft tissues and bony thorax are unremarkable. IMPRESSION: 1. Bilateral lower lobe pneumonia. 2. Bilateral pleural effusions. Electronically Signed   By: San Morelle M.D.   On: 04/20/2016 14:38    Scheduled Meds: . aspirin EC  81 mg Oral QHS  . heparin  5,000 Units Subcutaneous Q8H  . metoprolol  50 mg Oral BID  . oseltamivir  30 mg Oral BID  . oxyCODONE  40 mg Oral Q8H  . pantoprazole  40 mg Oral BID  . piperacillin-tazobactam (ZOSYN)  IV  3.375 g Intravenous Q8H  . polyethylene glycol  17 g Oral BID  . saccharomyces boulardii  250 mg Oral Daily  . sodium chloride flush  3 mL Intravenous Q12H   Continuous Infusions: . sodium chloride 75 mL/hr at 04/20/16 2100     LOS: 1 day   Time spent: 25 minutes.  Vance Gather, MD [facility] Pager [phone]  If 7PM-7AM, please contact night-coverage [email or web address] Password [email or web address] 04/21/2016, 12:56 PM

## 2016-04-21 NOTE — Evaluation (Signed)
Clinical/Bedside Swallow Evaluation Patient Details  Name: [name] MRN: [id number] Date of Birth: 03-15-1933  Today's Date: 04/21/2016 Time: SLP Start Time (ACUTE ONLY): 1252 SLP Stop Time (ACUTE ONLY): 1305 SLP Time Calculation (min) (ACUTE ONLY): 13 min  Past Medical History:  Past Medical History:  Diagnosis Date  . Anemia 10-05-12   hgb-9.0 on 09-14-12  . Arm vein blood clot 10-05-12   Rt. arm '12  . Arthritis 10-05-12   degenerative joint disease,scoliosis spine  . Dysrhythmia 10-05-12   tachycardia -tx. Lopressor  . GERD (gastroesophageal reflux disease)   . H/O esophagitis   . H/O hiatal hernia   . Tachycardia   . Transfusion history 10-05-12   4 units blood '12   Past Surgical History:  Past Surgical History:  Procedure Laterality Date  . ABDOMINAL HYSTERECTOMY    . APPENDECTOMY     child  . BACK SURGERY  10-05-12   x5-last fusion with plates  . BREAST ENHANCEMENT SURGERY    . CATARACT EXTRACTION, BILATERAL    . COLONOSCOPY WITH PROPOFOL N/A 10/23/2012   Procedure: COLONOSCOPY WITH PROPOFOL;  Surgeon: Lear Ng, MD;  Location: [facility];  Service: Endoscopy;  Laterality: N/A;  . HOT HEMOSTASIS N/A 10/23/2012   Procedure: HOT HEMOSTASIS (ARGON PLASMA COAGULATION/BICAP);  Surgeon: Lear Ng, MD;  Location: [facility];  Service: Endoscopy;  Laterality: N/A;   HPI:  83 yo female adm to [name] with weakness, AMS - diagnosed with sepsis.  PMH + for recent admit for aspiration pneumonia, chronic pain, dehydration, esophagitis, hiatal hernia, GERD, dysrhythmia, anemia, scoliosis, DJD.  [facility] resides at [name].  Swallow evaluation was ordered.    Assessment / Plan / Recommendation Clinical Impression  Limited assessment completed due to pt's polite refusal to consume solids stating she "felt bad" and appearing quite fatigued.  No focal CN deficits present but pt does not appear to have a uvula, she states she has not had it removed but  "they could never find it".  Xerostomic and erythemic posterior oral cavity noted- which pt attributes to some "autoimmune disease".  No indications of airway compromise with water nor Ensure consumption.  Pt reports she does not use straws but did not indicate reasoning.   Advised pt that SLP would follow up however due to recent admit for pna and concern for possible aspiration.  Advised pt to findings of UGI study in 2012 and recommendations for soft/thin diet currently.    Will follow up next date - hopeful for meal observation to allow better clinical evaluation and determine if instrumental evaluation indicated.  Of note, pt takes large pills crushed with applesauce and recommend to continue.       Aspiration Risk  Moderate aspiration risk    Diet Recommendation Dysphagia 3 (Mech soft);Thin liquid   Liquid Administration via: Cup (pt reports she does not use straws) Medication Administration: Whole meds with liquid (crush with puree if large) Supervision: Patient able to self feed Compensations: Small sips/bites;Slow rate Postural Changes: Remain upright for at least 30 minutes after po intake;Seated upright at 90 degrees    Other  Recommendations Oral Care Recommendations: Oral care BID   Follow up Recommendations   tbd     Frequency and Duration min 1 x/week  1 week       Prognosis Prognosis for Safe Diet Advancement: Fair Barriers to Reach Goals: Motivation      Swallow Study   General Date of Onset: 04/21/16 HPI: 83  yo female adm to [name] with weakness, AMS - diagnosed with sepsis.  PMH + for recent admit for aspiration pneumonia, chronic pain, dehydration, esophagitis, hiatal hernia, GERD, dysrhythmia, anemia, scoliosis, DJD.  [facility] resides at [name] currently.  Swallow evaluation was ordered.  Type of Study: Bedside Swallow Evaluation Previous Swallow Assessment: UGI 07/2010 moderate reflux (spontaneous), moderate hiatal hernia; pt had abdominal pain for  indication Diet Prior to this Study: Dysphagia 3 (soft);Thin liquids Temperature Spikes Noted: Yes Respiratory Status: Nasal cannula History of Recent Intubation: No Behavior/Cognition: Alert;Cooperative;Other (Comment) (pt reports discomfort and lack of desire for po intake) Oral Cavity Assessment: Erythema;Other (comment) (whitish coating posterior oral cavity with surrounding erythema) Oral Care Completed by SLP: No Oral Cavity - Dentition: Adequate natural dentition Vision: Functional for self-feeding Self-Feeding Abilities: Able to feed self Patient Positioning: Upright in bed Baseline Vocal Quality: Low vocal intensity Volitional Cough: Weak (pt reports discomfort, ? impact on strength of cough) Volitional Swallow: Able to elicit    Oral/Motor/Sensory Function Overall Oral Motor/Sensory Function: Generalized oral weakness (pt does not appear to have a uvula, she states she has not had it removed but "they could never find it") Velum:  (no uvula visualized)   Ice Chips Ice chips: Not tested   Thin Liquid Thin Liquid: Within functional limits Presentation: Straw;Self Fed    Nectar Thick Nectar Thick Liquid: Within functional limits Presentation: Self Fed;Straw   Honey Thick Honey Thick Liquid: Not tested   Puree Puree: Not tested   Solid   GO   Solid: Not tested        [name], [name] SLP [phone]

## 2016-04-21 NOTE — Progress Notes (Signed)
Pt arrived to unit room 1520 via stretcher. VS taken, alert and oriented X4. Pt oriented to room and callbell with no complications. Pain 0/10, general weakness, SHOB with exertion, on 4L.Marland Kitchen Pt guide at the bedside. Initial assessment completed, will continue to monitor

## 2016-04-22 ENCOUNTER — Encounter (HOSPITAL_COMMUNITY): Payer: Self-pay

## 2016-04-22 LAB — CBC
HEMATOCRIT: 28.6 % — AB (ref 36.0–46.0)
Hemoglobin: 9 g/dL — ABNORMAL LOW (ref 12.0–15.0)
MCH: 24.6 pg — ABNORMAL LOW (ref 26.0–34.0)
MCHC: 31.5 g/dL (ref 30.0–36.0)
MCV: 78.1 fL (ref 78.0–100.0)
Platelets: 679 10*3/uL — ABNORMAL HIGH (ref 150–400)
RBC: 3.66 MIL/uL — ABNORMAL LOW (ref 3.87–5.11)
RDW: 17.7 % — AB (ref 11.5–15.5)
WBC: 11.3 10*3/uL — AB (ref 4.0–10.5)

## 2016-04-22 LAB — BASIC METABOLIC PANEL
ANION GAP: 8 (ref 5–15)
BUN: 9 mg/dL (ref 6–20)
CO2: 22 mmol/L (ref 22–32)
Calcium: 8.3 mg/dL — ABNORMAL LOW (ref 8.9–10.3)
Chloride: 106 mmol/L (ref 101–111)
Creatinine, Ser: 0.64 mg/dL (ref 0.44–1.00)
Glucose, Bld: 118 mg/dL — ABNORMAL HIGH (ref 65–99)
Potassium: 3.6 mmol/L (ref 3.5–5.1)
Sodium: 136 mmol/L (ref 135–145)

## 2016-04-22 LAB — STREP PNEUMONIAE URINARY ANTIGEN: STREP PNEUMO URINARY ANTIGEN: NEGATIVE

## 2016-04-22 NOTE — NC FL2 (Signed)
[place] MEDICAID FL2 LEVEL OF CARE SCREENING TOOL     IDENTIFICATION  Patient Name: [name] Birthdate: May 02, 1932 Sex: female Admission Date (Current Location): 04/20/2016  [facility] and [place] Number:  Herbalist and Address:  [facility],  [place] [address], [place]      Provider Number: [id number]  Attending Physician Name and Address:  Patrecia Pour, MD  Relative Name and Phone Number:       Current Level of Care: SNF Recommended Level of Care: [place] Prior Approval Number:    Date Approved/Denied:   PASRR Number: [id number]  Discharge Plan: SNF    Current Diagnoses: Patient Active Problem List   Diagnosis Date Noted  . Hypoxia   . Sepsis ([place]) 03/24/2016  . Aspiration pneumonia ([place]) 03/24/2016  . Urinary retention 03/24/2016  . Hyperglycemia 03/24/2016  . Protein-calorie malnutrition, severe ([place]) 03/24/2016  . Leukocytosis 01/27/2014  . Sinus tachycardia 01/27/2014  . Essential hypertension 01/27/2014  . Colitis 01/24/2014  . Iron deficiency anemia 10/23/2012  . Benign neoplasm of colon 10/23/2012    Orientation RESPIRATION BLADDER Height & Weight     Self, Situation, Place, Time  O2 (5L) Continent Weight: 135 lb (61.2 kg) Height:  5\' 3"  (160 cm)  BEHAVIORAL SYMPTOMS/MOOD NEUROLOGICAL BOWEL NUTRITION STATUS      Continent Diet (Dyshagia 3)  AMBULATORY STATUS COMMUNICATION OF NEEDS Skin   Limited Assist Verbally Normal                       Personal Care Assistance Level of Assistance  Bathing, Feeding, Dressing Bathing Assistance: Maximum assistance Feeding assistance: Independent Dressing Assistance: Maximum assistance     Functional Limitations Info  Sight, Hearing, Speech Sight Info: Adequate Hearing Info: Adequate Speech Info: Adequate    SPECIAL CARE FACTORS FREQUENCY  PT (By licensed PT)     PT Frequency: 5              Contractures Contractures Info: Not present     Additional Factors Info  Code Status, Allergies, Psychotropic Code Status Info: Full Allergies Info:  Ciprofloxacin           Current Medications (04/22/2016):  This is the current hospital active medication list Current Facility-Administered Medications  Medication Dose Route Frequency Provider Last Rate Last Dose  . 0.9 %  sodium chloride infusion   Intravenous Continuous [name], MD 75 mL/hr at 04/21/16 2332    . acetaminophen (TYLENOL) tablet 650 mg  650 mg Oral Q6H PRN [name], MD   650 mg at 04/22/16 0514   Or  . acetaminophen (TYLENOL) suppository 650 mg  650 mg Rectal Q6H PRN [name], MD      . aspirin EC tablet 81 mg  81 mg Oral QHS [name], MD   81 mg at 04/21/16 2150  . heparin injection 5,000 Units  5,000 Units Subcutaneous Q8H [name], MD   5,000 Units at 04/22/16 [phone]  . ipratropium-albuterol (DUONEB) 0.5-2.5 (3) MG/3ML nebulizer solution 3 mL  3 mL Nebulization Q4H PRN [name], MD   3 mL at 04/22/16 0514  . metoprolol tartrate (LOPRESSOR) tablet 50 mg  50 mg Oral BID [name], MD   50 mg at 04/22/16 1039  . oseltamivir (TAMIFLU) capsule 30 mg  30 mg Oral BID Patrecia Pour, MD   30 mg at 04/22/16 1039  . oxyCODONE (OXYCONTIN) 12  hr tablet 40 mg  40 mg Oral Q8H [name], MD   40 mg at 04/22/16 0514  . pantoprazole (PROTONIX) EC tablet 40 mg  40 mg Oral BID [name], MD   40 mg at 04/22/16 1039  . piperacillin-tazobactam (ZOSYN) IVPB 3.375 g  3.375 g Intravenous Q8H Donald Prose Runyon, RPH   3.375 g at 04/22/16 1039  . polyethylene glycol (MIRALAX / GLYCOLAX) packet 17 g  17 g Oral BID [name], MD   17 g at 04/21/16 2148  . promethazine (PHENERGAN) tablet 25 mg  25 mg Oral Q4H PRN [name], MD   25 mg at 04/21/16 2332  . saccharomyces boulardii (FLORASTOR) capsule 250 mg  250 mg Oral Daily [name], MD   250 mg at 04/22/16 1039  . sodium chloride flush (NS) 0.9 % injection 3 mL  3 mL Intravenous Q12H  [name], MD   3 mL at 04/21/16 2200     Discharge Medications: Please see discharge summary for a list of discharge medications.  Relevant Imaging Results:  Relevant Lab Results:   Additional Information [id number]  Lia Hopping, LCSW

## 2016-04-22 NOTE — Progress Notes (Signed)
Speech Language Pathology Treatment: Dysphagia  Patient Details Name: [name] MRN: [id number] DOB: [birth year] Today's Date: 04/22/2016 Time: 1557-1610 SLP Time Calculation (min) (ACUTE ONLY): 13 min  Assessment / Plan / Recommendation Clinical Impression  Pt's intake remains minimal due to not feeling well from the flu.  Observed her swallowing water - subtle throat clearing noted (without pt awareness) that abated.  ? Secretion retention or h/o GERD contributing to symptoms.  Pt admits she is only consuming Ensures due to her illness but states she will try more when she feels better.    Given pt h/o dysphagia (GERD, esophagitis, dysmotility) and deconditioning, recommend to continue dys3/thin diet with precautions.  Pt is managing her chronic dysphagia she does not use straws and takes medications with puree if large - indicative of her compensations.  No SLP follow up indicated - SlP printed prior visit from Dec 2017 for pt records and reviewed evaluation with her.  Thanks for this referral.    HPI HPI: 82 yo female adm to [facility] with weakness, AMS - diagnosed with sepsis.  PMH + for recent admit for aspiration pneumonia, chronic pain, dehydration, esophagitis, hiatal hernia, GERD, dysrhythmia, anemia, scoliosis, DJD.  Pt resides at [facility] currently.  Swallow evaluation was ordered.       SLP Plan        Recommendations  Diet recommendations: Dysphagia 3 (mechanical soft);Thin liquid Liquids provided via: Cup;Straw Medication Administration: Other (Comment) (defer to pt) Supervision: Patient able to self feed Compensations: Slow rate;Small sips/bites Postural Changes and/or Swallow Maneuvers: Seated upright 90 degrees;Upright 30-60 min after meal                Follow up Recommendations: None       GO                [name], [facility] SLP [phone]

## 2016-04-22 NOTE — Clinical Social Work Note (Signed)
Clinical Social Work Assessment  Patient Details  Name: [name] MRN: [id number] Date of Birth: 14-Jul-1932  Date of referral:  04/22/16               Reason for consult:  Facility Placement                Permission sought to share information with:  Family Supports, Facility Sport and exercise psychologist Permission granted to share information::     Name::     P[place]ia Ybarra or Rolan Lipa  Agency::  [facility]   Relationship::  Son / Garment/textile technologist Information:  [phone]  Housing/Transportation Living arrangements for the past 2 months:  [place] of Information:  Facility, Comptroller) Patient Interpreter Needed:  None Criminal Activity/Legal Involvement Pertinent to Current Situation/Hospitalization:    Significant Relationships:  Adult Children, Warehouse manager Lives with:  Facility Resident Do you feel safe going back to the place where you live?  Yes Need for family participation in patient care:  Yes (Comment)  Care giving concerns:  No concern presented. Patient will return to [facility].   Social Worker assessment / plan: Plan is for patient to return to [facility]. Patient gave LCSWA permission to contact caretaker and son.  Plan: Assist with return to SNF  Employment status:    Insurance information:  Managed Medicare PT Recommendations:  Not assessed at this time Information / Referral to community resources:  [place]  Patient/Family's Response to care:  No family at bedside called and left voicemail with son.   Patient/Family's Understanding of and Emotional Response to Diagnosis, Current Treatment, and Prognosis: No family at bedside.   Emotional Assessment Appearance:  Appears stated age Attitude/Demeanor/Rapport:    Affect (typically observed):  Accepting, Calm, Pleasant Orientation:  Oriented to Self, Oriented to Place Alcohol / Substance use:  Not Applicable Psych  involvement (Current and /or in the community):  No (Comment)  Discharge Needs  Concerns to be addressed:  Discharge Planning Concerns, Care Coordination Readmission within the last 30 days:  No Current discharge risk:  None Barriers to Discharge:  Continued Medical Work up   [employer], LCSW 04/22/2016, 11:58 AM

## 2016-04-22 NOTE — Progress Notes (Signed)
PROGRESS NOTE  [name]  [id number] DOB: [birth year] DOA: 04/20/2016 PCP: [name], MD   Brief Narrative: [name] is a 83 y.o. female with a history of recent admission for aspiration pneumonia in 12/17, discharged on oxygen, who presented from SNF after being found to be hypoxic to the 80's. On arrival that patient had no specific complaints, but was noted to have Tmax of 100.6F with tachycardia to the 120's. Presenting lactate of 1.77. WBC noted to be 19.6. CXR demonstrated evidence of bilateral infiltrates. Patient was started on empiric zosyn in the ED with 1L bolus given. She was admitted for further management of hypoxemic respiratory failure due to presumed recurrent aspiration pneumonia. Flu PCR was positive and tamiflu was started. She has shown limited improvement, still requiring 5L oxygen.   Assessment & Plan: Principal Problem:   Sepsis ([place]) Active Problems:   Leukocytosis   Sinus tachycardia   Essential hypertension   Aspiration pneumonia (HCC)   Protein-calorie malnutrition, severe (HCC)  Acute hypoxemic respiratory failure: Has had oxygen requirement since last admission for aspiration pneumonia to 3L, currently requiring 5L by [place].  - Continue oxygen by nasal cannula. Will wean as able.  Sepsis due to influenza and suspected recurrent aspiration pneumonia: CXR showed bilateral infiltrates, pt febrile with tachycardia.  - Continue dysphagia III diet with thin liquids per SLP reevaluation 01/04 - Cover anaerobic respiratory pathogens with zosyn - Continue tamiflu x5 days (01/04 - 01/08) - Monitor blood and sputum cultures - Leukocytosis improving - Facility notified of +flu  HTN: Chronic, stable.  - Continue home medications  Severe protein calorie malnutrition: chronic, stable.  - Continue to encourage po.  DVT prophylaxis: Heparin Code Status: Full Family Communication: Discussed with daughter (POA) by phone yesterday, no answer today. Left  HIPAA-compliant VM. Disposition Plan: Continue inpatient management, anticipate DC back to SNF in next 24-48 hours.   Consultants:   None  Procedures:   None  Antimicrobials:  Vanc/cefepime 01/03  Zosyn 01/03 >>   Tamiflu 01/04 - 01/08   Subjective: Pt feels weak but improving subjectively. Not eating well, drank an ensure. Denies cough, chest pain, dyspnea, abd pain, N/V/D, dysuria. Had BM in bed last night. Has not been OOB.  Objective: Vitals:   04/21/16 1455 04/21/16 2028 04/21/16 2200 04/22/16 0507  BP: 116/87 (!) 134/58  (!) 146/66  Pulse: (!) 104 (!) 112  (!) 103  Resp: 20 (!) 27 20 (!) 22  Temp: 98.7 F (37.1 C) 98.8 F (37.1 C)  97.6 F (36.4 C)  TempSrc: Oral Oral  Oral  SpO2: 93% 93%  94%  Weight:      Height:        Intake/Output Summary (Last 24 hours) at 04/22/16 1456 Last data filed at 04/22/16 0300  Gross per 24 hour  Intake             1825 ml  Output                0 ml  Net             1825 ml   Filed Weights   04/20/16 1420  Weight: 61.2 kg (135 lb)    Examination: General exam: Pleasant, elderly female in no distress  Respiratory system: Mildly labored breathing 5L by [place]. Diffuse rhonchi bilaterally.  Cardiovascular system: Tachycardic. No murmur, rub, or gallop. No JVD, and no pedal edema. Gastrointestinal system: Abdomen soft, non-tender, non-distended, with normoactive bowel sounds. No organomegaly or  masses felt. Central nervous system: Alert and oriented. No focal neurological deficits. Extremities: Warm, no deformities Skin: No rashes, lesions no ulcers Psychiatry: Judgement and insight appear normal. Mood & affect appropriate.   Data Reviewed: I have personally reviewed following labs and imaging studies  CBC:  Recent Labs Lab 04/20/16 1453 04/21/16 0557 04/22/16 0622  WBC 19.6* 12.1* 11.3*  HGB 11.4* 9.3* 9.0*  HCT 36.6 30.9* 28.6*  MCV 78.4 77.1* 78.1  PLT 755* 666* [id number]*   Basic Metabolic Panel:  Recent  Labs Lab 04/20/16 1453 04/21/16 0557 04/22/16 0622  NA 134* 134* 136  K 5.2* 3.9 3.6  CL 99* 102 106  CO2 25 22 22   GLUCOSE 142* 104* 118*  BUN 13 11 9   CREATININE 0.70 0.60 0.64  CALCIUM 9.0 8.3* 8.3*   GFR: Estimated Creatinine Clearance: 45.6 mL/min (by C-G formula based on SCr of 0.64 mg/dL). Liver Function Tests:  Recent Labs Lab 04/20/16 1453 04/21/16 0557  AST 47* 28  ALT 36 27  ALKPHOS 97 79  BILITOT 0.7 0.5  PROT 7.8 6.2*  ALBUMIN 2.7* 2.3*   No results for input(s): LIPASE, AMYLASE in the last 168 hours. No results for input(s): AMMONIA in the last 168 hours. Coagulation Profile: No results for input(s): INR, PROTIME in the last 168 hours. Cardiac Enzymes:  Recent Labs Lab 04/20/16 1453  TROPONINI 0.06*   BNP (last 3 results) No results for input(s): PROBNP in the last 8760 hours. HbA1C: No results for input(s): HGBA1C in the last 72 hours. CBG: No results for input(s): GLUCAP in the last 168 hours. Lipid Profile: No results for input(s): CHOL, HDL, LDLCALC, TRIG, CHOLHDL, LDLDIRECT in the last 72 hours. Thyroid Function Tests: No results for input(s): TSH, T4TOTAL, FREET4, T3FREE, THYROIDAB in the last 72 hours. Anemia Panel: No results for input(s): VITAMINB12, FOLATE, FERRITIN, TIBC, IRON, RETICCTPCT in the last 72 hours. Urine analysis:    Component Value Date/Time   COLORURINE YELLOW 03/23/2016 2210   APPEARANCEUR CLEAR 03/23/2016 2210   LABSPEC 1.009 03/23/2016 2210   PHURINE 7.0 03/23/2016 2210   GLUCOSEU NEGATIVE 03/23/2016 2210   HGBUR NEGATIVE 03/23/2016 2210   BILIRUBINUR NEGATIVE 03/23/2016 2210   KETONESUR NEGATIVE 03/23/2016 2210   PROTEINUR NEGATIVE 03/23/2016 2210   UROBILINOGEN 0.2 01/26/2014 2329   NITRITE NEGATIVE 03/23/2016 2210   LEUKOCYTESUR NEGATIVE 03/23/2016 2210   Recent Results (from the past 240 hour(s))  Blood culture (routine x 2)     Status: None (Preliminary result)   Collection Time: 04/20/16  3:29 PM   Result Value Ref Range Status   Specimen Description BLOOD RIGHT WRIST  Final   Special Requests IN PEDIATRIC BOTTLE 2CC  Final   Culture   Final    NO GROWTH < 24 HOURS Performed at [facility]    Report Status PENDING  Incomplete  Blood culture (routine x 2)     Status: None (Preliminary result)   Collection Time: 04/20/16  3:31 PM  Result Value Ref Range Status   Specimen Description BLOOD LEFT HAND  Final   Special Requests BOTTLES DRAWN AEROBIC ONLY 5CC  Final   Culture   Final    NO GROWTH < 24 HOURS Performed at [facility]    Report Status PENDING  Incomplete  MRSA PCR Screening     Status: None   Collection Time: 04/20/16  9:52 PM  Result Value Ref Range Status   MRSA by PCR NEGATIVE NEGATIVE Final  Comment:        The GeneXpert MRSA Assay (FDA approved for NASAL specimens only), is one component of a comprehensive MRSA colonization surveillance program. It is not intended to diagnose MRSA infection nor to guide or monitor treatment for MRSA infections.      Radiology Studies: No results found.  Scheduled Meds: . aspirin EC  81 mg Oral QHS  . heparin  5,000 Units Subcutaneous Q8H  . metoprolol  50 mg Oral BID  . oseltamivir  30 mg Oral BID  . oxyCODONE  40 mg Oral Q8H  . pantoprazole  40 mg Oral BID  . piperacillin-tazobactam (ZOSYN)  IV  3.375 g Intravenous Q8H  . polyethylene glycol  17 g Oral BID  . saccharomyces boulardii  250 mg Oral Daily  . sodium chloride flush  3 mL Intravenous Q12H   Continuous Infusions: . sodium chloride 75 mL/hr at 04/21/16 2332     LOS: 2 days   Time spent: 25 minutes.  Vance Gather, MD [facility] Pager [phone]  If 7PM-7AM, please contact night-coverage [email or web address] Password [email or web address] 04/22/2016, 2:56 PM

## 2016-04-23 MED ORDER — ENSURE ENLIVE PO LIQD
237.0000 mL | Freq: Two times a day (BID) | ORAL | Status: DC
  Administered 2016-04-23 – 2016-04-26 (×3): 237 mL via ORAL

## 2016-04-23 NOTE — Progress Notes (Signed)
[facility]  PROGRESS NOTE  [name] [id number] DOB: 1932/08/31 DOA: 04/20/2016 PCP: [name], MD   Brief HPI:    83 y.o.femalewith a history of recent admission for aspiration pneumonia in 12/17, discharged on oxygen, who presented from SNF after being found to be hypoxic to the 80's. On arrival that patient had no specific complaints, but was noted to have Tmax of 100.67F with tachycardia to the 120's. Presenting lactate of 1.77. WBC noted to be 19.6. CXR demonstrated evidence of bilateral infiltrates. Patient was started on empiric zosyn in the ED with 1L bolus given. She was admitted for further management of hypoxemic respiratory failure due to presumed recurrent aspiration pneumonia. Flu PCR was positive and tamiflu was started. She has shown limited improvement, still requiring 5L oxygen   Subjective   Patient seen and examined, denies any complaints. No shortness of breath   Assessment/Plan:     1. Acute on chronic hypoxemic respiratory failure- worsening from influenza and suspected aspiration pneumonia. Continue Tamiflu for 5 days(01/04 - 01/08). Follow blood cultures, negative to date. Continue Zosyn. 2. Hypertension- stable, continue metoprolol 50 mg twice a day. 3. Protein calorie malnutrition- stable. Start ensure 1 can by mouth 2 times a day.    DVT prophylaxis: Heparin  Code Status: Full code  Family Communication: No family present at bedside  Disposition Plan: Likely discharge to skilled facility next 24 hours   Consultants:  None  Procedures:  None  Continuous infusions . sodium chloride 75 mL/hr at 04/23/16 0600      Antibiotics:   Anti-infectives    Start     Dose/Rate Route Frequency Ordered Stop   04/21/16 1000  oseltamivir (TAMIFLU) capsule 75 mg  Status:  Discontinued     75 mg Oral 2 times daily 04/21/16 0845 04/21/16 0906   04/21/16 1000  oseltamivir (TAMIFLU) capsule 30 mg     30 mg Oral 2 times daily 04/21/16  0906 04/26/16 0959   04/21/16 0600  vancomycin (VANCOCIN) 500 mg in sodium chloride 0.9 % 100 mL IVPB  Status:  Discontinued     500 mg 100 mL/hr over 60 Minutes Intravenous Every 12 hours 04/20/16 1650 04/20/16 1703   04/21/16 0200  piperacillin-tazobactam (ZOSYN) IVPB 3.375 g     3.375 g 12.5 mL/hr over 240 Minutes Intravenous Every 8 hours 04/20/16 1815     04/20/16 1715  piperacillin-tazobactam (ZOSYN) IVPB 3.375 g     3.375 g 12.5 mL/hr over 240 Minutes Intravenous  Once 04/20/16 1703 04/20/16 1859   04/20/16 1700  ceFEPIme (MAXIPIME) 1 g in dextrose 5 % 50 mL IVPB  Status:  Discontinued     1 g 100 mL/hr over 30 Minutes Intravenous Every 12 hours 04/20/16 1650 04/20/16 1703   04/20/16 1515  ceFEPIme (MAXIPIME) 2 g in dextrose 5 % 50 mL IVPB     2 g 100 mL/hr over 30 Minutes Intravenous  Once 04/20/16 1508 04/20/16 1614   04/20/16 1515  vancomycin (VANCOCIN) IVPB 1000 mg/200 mL premix     1,000 mg 200 mL/hr over 60 Minutes Intravenous  Once 04/20/16 1508 04/20/16 1758       Objective   Vitals:   04/22/16 0507 04/22/16 1507 04/22/16 2045 04/23/16 0612  BP: (!) 146/66 133/64 138/79 (!) 146/69  Pulse: (!) 103 84 99 95  Resp: (!) 22 20 (!) 21 20  Temp: 97.6 F (36.4 C) 98.2 F (36.8 C) 98.3 F (36.8 C) 98.2 F (36.8 C)  TempSrc:  Oral Oral Axillary Axillary  SpO2: 94% 98% 99% 95%  Weight:      Height:        Intake/Output Summary (Last 24 hours) at 04/23/16 1317 Last data filed at 04/23/16 0600  Gross per 24 hour  Intake             1140 ml  Output                0 ml  Net             1140 ml   Filed Weights   04/20/16 1420  Weight: 61.2 kg (135 lb)     Physical Examination:  General exam: Appears calm and comfortable. Respiratory system: Bilateral rhonchi auscultated. Respiratory effort normal. Cardiovascular system:  RRR. No  murmurs, rubs, gallops. No pedal edema. GI system: Abdomen is nondistended, soft and nontender. No organomegaly.  Central nervous  system. No focal neurological deficits. 5 x 5 power in all extremities. Skin: No rashes, lesions or ulcers. Psychiatry: Alert, oriented x 3.Judgement and insight appear normal. Affect normal.    Data Reviewed: I have personally reviewed following labs and imaging studies  CBG: No results for input(s): GLUCAP in the last 168 hours.  CBC:  Recent Labs Lab 04/20/16 1453 04/21/16 0557 04/22/16 0622  WBC 19.6* 12.1* 11.3*  HGB 11.4* 9.3* 9.0*  HCT 36.6 30.9* 28.6*  MCV 78.4 77.1* 78.1  PLT 755* 666* 679*    Basic Metabolic Panel:  Recent Labs Lab 04/20/16 1453 04/21/16 0557 04/22/16 0622  NA 134* 134* 136  K 5.2* 3.9 3.6  CL 99* 102 106  CO2 25 22 22   GLUCOSE 142* 104* 118*  BUN 13 11 9   CREATININE 0.70 0.60 0.64  CALCIUM 9.0 8.3* 8.3*    Recent Results (from the past 240 hour(s))  Blood culture (routine x 2)     Status: None (Preliminary result)   Collection Time: 04/20/16  3:29 PM  Result Value Ref Range Status   Specimen Description BLOOD RIGHT WRIST  Final   Special Requests IN PEDIATRIC BOTTLE 2CC  Final   Culture   Final    NO GROWTH 3 DAYS Performed at [facility]    Report Status PENDING  Incomplete  Blood culture (routine x 2)     Status: None (Preliminary result)   Collection Time: 04/20/16  3:31 PM  Result Value Ref Range Status   Specimen Description BLOOD LEFT HAND  Final   Special Requests BOTTLES DRAWN AEROBIC ONLY 5CC  Final   Culture   Final    NO GROWTH 3 DAYS Performed at [facility]    Report Status PENDING  Incomplete  MRSA PCR Screening     Status: None   Collection Time: 04/20/16  9:52 PM  Result Value Ref Range Status   MRSA by PCR NEGATIVE NEGATIVE Final    Comment:        The GeneXpert MRSA Assay (FDA approved for NASAL specimens only), is one component of a comprehensive MRSA colonization surveillance program. It is not intended to diagnose MRSA infection nor to guide or monitor treatment for MRSA  infections.      Liver Function Tests:  Recent Labs Lab 04/20/16 1453 04/21/16 0557  AST 47* 28  ALT 36 27  ALKPHOS 97 79  BILITOT 0.7 0.5  PROT 7.8 6.2*  ALBUMIN 2.7* 2.3*   No results for input(s): LIPASE, AMYLASE in the last 168 hours. No results for input(s): AMMONIA  in the last 168 hours.  Cardiac Enzymes:  Recent Labs Lab 04/20/16 1453  TROPONINI 0.06*      Studies: No results found.  Scheduled Meds: . aspirin EC  81 mg Oral QHS  . heparin  5,000 Units Subcutaneous Q8H  . metoprolol  50 mg Oral BID  . oseltamivir  30 mg Oral BID  . oxyCODONE  40 mg Oral Q8H  . pantoprazole  40 mg Oral BID  . piperacillin-tazobactam (ZOSYN)  IV  3.375 g Intravenous Q8H  . polyethylene glycol  17 g Oral BID  . saccharomyces boulardii  250 mg Oral Daily  . sodium chloride flush  3 mL Intravenous Q12H      Time spent: 25 min  [facility] Pager [phone]. If 7PM-7AM, please contact night-coverage at [email or web address], Office  [phone]  password [email or web address] 04/23/2016, 1:17 PM  LOS: 3 days

## 2016-04-24 MED ORDER — DEXTROSE 5 % IV SOLN
1.0000 g | Freq: Two times a day (BID) | INTRAVENOUS | Status: DC
  Administered 2016-04-24: 1 g via INTRAVENOUS
  Filled 2016-04-24 (×2): qty 1

## 2016-04-24 MED ORDER — HYDROXYZINE HCL 25 MG PO TABS
25.0000 mg | ORAL_TABLET | Freq: Four times a day (QID) | ORAL | Status: DC | PRN
  Administered 2016-04-24 – 2016-04-26 (×6): 25 mg via ORAL
  Filled 2016-04-24 (×6): qty 1

## 2016-04-24 MED ORDER — ONDANSETRON HCL 4 MG/2ML IJ SOLN
4.0000 mg | Freq: Four times a day (QID) | INTRAMUSCULAR | Status: DC | PRN
Start: 2016-04-24 — End: 2016-04-26
  Administered 2016-04-24 – 2016-04-26 (×8): 4 mg via INTRAVENOUS
  Filled 2016-04-24 (×8): qty 2

## 2016-04-24 MED ORDER — DEXTROSE 5 % IV SOLN
2.0000 g | Freq: Once | INTRAVENOUS | Status: AC
  Administered 2016-04-24: 2 g via INTRAVENOUS
  Filled 2016-04-24: qty 2

## 2016-04-24 NOTE — Progress Notes (Signed)
[facility]  PROGRESS NOTE  [name] [id number] DOB: 1932/09/24 DOA: 04/20/2016 PCP: [name], MD   Brief HPI:    83 y.o.femalewith a history of recent admission for aspiration pneumonia in 12/17, discharged on oxygen, who presented from SNF after being found to be hypoxic to the 80's. On arrival that patient had no specific complaints, but was noted to have Tmax of 100.60F with tachycardia to the 120's. Presenting lactate of 1.77. WBC noted to be 19.6. CXR demonstrated evidence of bilateral infiltrates. Patient was started on empiric zosyn in the ED with 1L bolus given. She was admitted for further management of hypoxemic respiratory failure due to presumed recurrent aspiration pneumonia. Flu PCR was positive and tamiflu was started. She has shown limited improvement, still requiring 5L oxygen   Subjective   Patient seen and examined, complains of nausea.   Assessment/Plan:     1. Acute on chronic hypoxemic respiratory failure- worsening from influenza and suspected aspiration pneumonia. Continue Tamiflu for 5 days(01/04 - 01/08). Follow blood cultures, negative to date. Will change zosyn to Cefepime. 2. Nausea- likely from Antibiotics, will change Zosyn to cefepime. Start Zofran when necessary 3. Hypertension- stable, continue metoprolol 50 mg twice a day. 4. Protein calorie malnutrition- stable. Start ensure 1 can by mouth 2 times a day.    DVT prophylaxis: Heparin  Code Status: Full code  Family Communication: No family present at bedside  Disposition Plan: Likely discharge to skilled facility next 24 hours   Consultants:  None  Procedures:  None  Continuous infusions . sodium chloride 75 mL/hr at 04/24/16 0158      Antibiotics:   Anti-infectives    Start     Dose/Rate Route Frequency Ordered Stop   04/24/16 2200  ceFEPIme (MAXIPIME) 1 g in dextrose 5 % 50 mL IVPB     1 g 100 mL/hr over 30 Minutes Intravenous Every 12 hours 04/24/16 1038      04/24/16 1100  ceFEPIme (MAXIPIME) 2 g in dextrose 5 % 50 mL IVPB     2 g 100 mL/hr over 30 Minutes Intravenous  Once 04/24/16 1038 04/24/16 1136   04/21/16 1000  oseltamivir (TAMIFLU) capsule 75 mg  Status:  Discontinued     75 mg Oral 2 times daily 04/21/16 0845 04/21/16 0906   04/21/16 1000  oseltamivir (TAMIFLU) capsule 30 mg     30 mg Oral 2 times daily 04/21/16 0906 04/26/16 0959   04/21/16 0600  vancomycin (VANCOCIN) 500 mg in sodium chloride 0.9 % 100 mL IVPB  Status:  Discontinued     500 mg 100 mL/hr over 60 Minutes Intravenous Every 12 hours 04/20/16 1650 04/20/16 1703   04/21/16 0200  piperacillin-tazobactam (ZOSYN) IVPB 3.375 g  Status:  Discontinued     3.375 g 12.5 mL/hr over 240 Minutes Intravenous Every 8 hours 04/20/16 1815 04/24/16 0920   04/20/16 1715  piperacillin-tazobactam (ZOSYN) IVPB 3.375 g     3.375 g 12.5 mL/hr over 240 Minutes Intravenous  Once 04/20/16 1703 04/20/16 1859   04/20/16 1700  ceFEPIme (MAXIPIME) 1 g in dextrose 5 % 50 mL IVPB  Status:  Discontinued     1 g 100 mL/hr over 30 Minutes Intravenous Every 12 hours 04/20/16 1650 04/20/16 1703   04/20/16 1515  ceFEPIme (MAXIPIME) 2 g in dextrose 5 % 50 mL IVPB     2 g 100 mL/hr over 30 Minutes Intravenous  Once 04/20/16 1508 04/20/16 1614   04/20/16 1515  vancomycin (VANCOCIN)  IVPB 1000 mg/200 mL premix     1,000 mg 200 mL/hr over 60 Minutes Intravenous  Once 04/20/16 1508 04/20/16 1758       Objective   Vitals:   04/23/16 2014 04/24/16 0520 04/24/16 1104 04/24/16 1424  BP: (!) 149/66 (!) 165/60 (!) 152/66 (!) 147/80  Pulse: 98 88 (!) 102 93  Resp: 20 20 20 19   Temp: 98 F (36.7 C) 97.5 F (36.4 C) 98.6 F (37 C) 97.7 F (36.5 C)  TempSrc: Axillary Oral Oral Oral  SpO2: 92% 98% 97% 95%  Weight:      Height:        Intake/Output Summary (Last 24 hours) at 04/24/16 1455 Last data filed at 04/24/16 0856  Gross per 24 hour  Intake             1160 ml  Output                0 ml   Net             1160 ml   Filed Weights   04/20/16 1420  Weight: 61.2 kg (135 lb)     Physical Examination:  General exam: Appears calm and comfortable. Respiratory system: Bilateral rhonchi auscultated. Respiratory effort normal. Cardiovascular system:  RRR. No  murmurs, rubs, gallops. No pedal edema. GI system: Abdomen is nondistended, soft and nontender. No organomegaly.  Central nervous system. No focal neurological deficits. 5 x 5 power in all extremities. Skin: No rashes, lesions or ulcers. Psychiatry: Alert, oriented x 3.Judgement and insight appear normal. Affect normal.    Data Reviewed: I have personally reviewed following labs and imaging studies  CBG: No results for input(s): GLUCAP in the last 168 hours.  CBC:  Recent Labs Lab 04/20/16 1453 04/21/16 0557 04/22/16 0622  WBC 19.6* 12.1* 11.3*  HGB 11.4* 9.3* 9.0*  HCT 36.6 30.9* 28.6*  MCV 78.4 77.1* 78.1  PLT 755* 666* 679*    Basic Metabolic Panel:  Recent Labs Lab 04/20/16 1453 04/21/16 0557 04/22/16 0622  NA 134* 134* 136  K 5.2* 3.9 3.6  CL 99* 102 106  CO2 25 22 22   GLUCOSE 142* 104* 118*  BUN 13 11 9   CREATININE 0.70 0.60 0.64  CALCIUM 9.0 8.3* 8.3*    Recent Results (from the past 240 hour(s))  Blood culture (routine x 2)     Status: None (Preliminary result)   Collection Time: 04/20/16  3:29 PM  Result Value Ref Range Status   Specimen Description BLOOD RIGHT WRIST  Final   Special Requests IN PEDIATRIC BOTTLE 2CC  Final   Culture   Final    NO GROWTH 3 DAYS Performed at [facility]    Report Status PENDING  Incomplete  Blood culture (routine x 2)     Status: None (Preliminary result)   Collection Time: 04/20/16  3:31 PM  Result Value Ref Range Status   Specimen Description BLOOD LEFT HAND  Final   Special Requests BOTTLES DRAWN AEROBIC ONLY 5CC  Final   Culture   Final    NO GROWTH 3 DAYS Performed at [facility]    Report Status PENDING  Incomplete   MRSA PCR Screening     Status: None   Collection Time: 04/20/16  9:52 PM  Result Value Ref Range Status   MRSA by PCR NEGATIVE NEGATIVE Final    Comment:        The GeneXpert MRSA Assay (FDA approved for NASAL specimens  only), is one component of a comprehensive MRSA colonization surveillance program. It is not intended to diagnose MRSA infection nor to guide or monitor treatment for MRSA infections.      Liver Function Tests:  Recent Labs Lab 04/20/16 1453 04/21/16 0557  AST 47* 28  ALT 36 27  ALKPHOS 97 79  BILITOT 0.7 0.5  PROT 7.8 6.2*  ALBUMIN 2.7* 2.3*   No results for input(s): LIPASE, AMYLASE in the last 168 hours. No results for input(s): AMMONIA in the last 168 hours.  Cardiac Enzymes:  Recent Labs Lab 04/20/16 1453  TROPONINI 0.06*      Studies: No results found.  Scheduled Meds: . aspirin EC  81 mg Oral QHS  . ceFEPime (MAXIPIME) IV  1 g Intravenous Q12H  . feeding supplement (ENSURE ENLIVE)  237 mL Oral BID BM  . heparin  5,000 Units Subcutaneous Q8H  . metoprolol  50 mg Oral BID  . oseltamivir  30 mg Oral BID  . oxyCODONE  40 mg Oral Q8H  . pantoprazole  40 mg Oral BID  . polyethylene glycol  17 g Oral BID  . saccharomyces boulardii  250 mg Oral Daily  . sodium chloride flush  3 mL Intravenous Q12H      Time spent: 25 min  [facility] Pager [phone]. If 7PM-7AM, please contact night-coverage at [email or web address], Office  [phone]  password [email or web address] 04/24/2016, 2:55 PM  LOS: 4 days

## 2016-04-24 NOTE — Progress Notes (Addendum)
Pharmacy Antibiotic Note  [name] is a 83 y.o. female admitted on 04/20/2016 with HCAP. Patient started on Vanc/Cefepime but promptly narrowed to Zosyn with negative MRSA screen. MD concerned today that Zosyn is making patient nauseated and not as clinically improved as hoped, so will switch back to Cefepime.  Will hold off on vancomycin for now unless patient declines again (note BCx clear x 3 days).  Plan:  Resume cefepime 2g IV x 1, then 1g IV q12 hr  F/u clinical course, renal function, and culture data  Height: 5\' 3"  (160 cm) Weight: 135 lb (61.2 kg) IBW/kg (Calculated) : 52.4  Temp (24hrs), Avg:98 F (36.7 C), Min:97.5 F (36.4 C), Max:98.5 F (36.9 C)   Recent Labs Lab 04/20/16 1453 04/20/16 1542 04/21/16 0557 04/22/16 0622  WBC 19.6*  --  12.1* 11.3*  CREATININE 0.70  --  0.60 0.64  LATICACIDVEN  --  1.77  --   --     Estimated Creatinine Clearance: 45.6 mL/min (by C-G formula based on SCr of 0.64 mg/dL).    Allergies  Allergen Reactions  . Ciprofloxacin Rash    burning    Antimicrobials this admission: 1/3 Vancomycin >> 1/3 1/3 Cefepime >> 1/3, resume 1/7 1/3 Zosyn >> 1/7   Dose adjustments this admission:   Microbiology results: 1/3 BCx: ng3d 1/3 MRSA PCR: neg  Thank you for allowing pharmacy to be a part of this patient's care.  Reuel Boom, PharmD, BCPS Pager: [phone] 04/24/2016, 10:43 AM

## 2016-04-25 ENCOUNTER — Inpatient Hospital Stay (HOSPITAL_COMMUNITY): Payer: Medicare Other

## 2016-04-25 DIAGNOSIS — J9601 Acute respiratory failure with hypoxia: Secondary | ICD-10-CM

## 2016-04-25 DIAGNOSIS — E43 Unspecified severe protein-calorie malnutrition: Secondary | ICD-10-CM

## 2016-04-25 DIAGNOSIS — J111 Influenza due to unidentified influenza virus with other respiratory manifestations: Secondary | ICD-10-CM

## 2016-04-25 DIAGNOSIS — R0600 Dyspnea, unspecified: Secondary | ICD-10-CM

## 2016-04-25 DIAGNOSIS — J690 Pneumonitis due to inhalation of food and vomit: Secondary | ICD-10-CM

## 2016-04-25 LAB — CULTURE, BLOOD (ROUTINE X 2)
CULTURE: NO GROWTH
Culture: NO GROWTH

## 2016-04-25 LAB — CBC
HCT: 31.3 % — ABNORMAL LOW (ref 36.0–46.0)
Hemoglobin: 9.3 g/dL — ABNORMAL LOW (ref 12.0–15.0)
MCH: 23.9 pg — ABNORMAL LOW (ref 26.0–34.0)
MCHC: 29.7 g/dL — ABNORMAL LOW (ref 30.0–36.0)
MCV: 80.5 fL (ref 78.0–100.0)
PLATELETS: 669 10*3/uL — AB (ref 150–400)
RBC: 3.89 MIL/uL (ref 3.87–5.11)
RDW: 18.4 % — ABNORMAL HIGH (ref 11.5–15.5)
WBC: 10.1 10*3/uL (ref 4.0–10.5)

## 2016-04-25 LAB — COMPREHENSIVE METABOLIC PANEL
ALT: 30 U/L (ref 14–54)
AST: 54 U/L — AB (ref 15–41)
Albumin: 2.2 g/dL — ABNORMAL LOW (ref 3.5–5.0)
Alkaline Phosphatase: 281 U/L — ABNORMAL HIGH (ref 38–126)
Anion gap: 11 (ref 5–15)
BUN: 5 mg/dL — AB (ref 6–20)
CHLORIDE: 103 mmol/L (ref 101–111)
CO2: 21 mmol/L — AB (ref 22–32)
CREATININE: 0.52 mg/dL (ref 0.44–1.00)
Calcium: 8.2 mg/dL — ABNORMAL LOW (ref 8.9–10.3)
GFR calc non Af Amer: >60 mL/min (ref >60)
Glucose, Bld: 100 mg/dL — ABNORMAL HIGH (ref 65–99)
POTASSIUM: 3.4 mmol/L — AB (ref 3.5–5.1)
SODIUM: 135 mmol/L (ref 135–145)
Total Bilirubin: 0.9 mg/dL (ref 0.3–1.2)
Total Protein: 5.8 g/dL — ABNORMAL LOW (ref 6.5–8.1)

## 2016-04-25 LAB — ECHOCARDIOGRAM COMPLETE
HEIGHTINCHES: 63 in
Weight: 2160 oz

## 2016-04-25 MED ORDER — AMOXICILLIN-POT CLAVULANATE 875-125 MG PO TABS
1.0000 | ORAL_TABLET | Freq: Two times a day (BID) | ORAL | Status: DC
  Administered 2016-04-25 – 2016-04-26 (×3): 1 via ORAL
  Filled 2016-04-25 (×3): qty 1

## 2016-04-25 MED ORDER — METOCLOPRAMIDE HCL 5 MG PO TABS
5.0000 mg | ORAL_TABLET | Freq: Three times a day (TID) | ORAL | Status: DC | PRN
  Administered 2016-04-25 (×2): 5 mg via ORAL
  Filled 2016-04-25 (×3): qty 1

## 2016-04-25 MED ORDER — POTASSIUM CHLORIDE CRYS ER 20 MEQ PO TBCR
40.0000 meq | EXTENDED_RELEASE_TABLET | Freq: Once | ORAL | Status: AC
  Administered 2016-04-25: 40 meq via ORAL
  Filled 2016-04-25: qty 2

## 2016-04-25 MED ORDER — DOXYCYCLINE HYCLATE 100 MG PO TABS: 100.0000 mg | ORAL_TABLET | Freq: Two times a day (BID) | ORAL | Status: DC

## 2016-04-25 NOTE — Progress Notes (Signed)
PROGRESS NOTE                                                                                                                                                                                                             Patient Demographics:    [name], is a 83 y.o. female, DOB - November 18, 1932, GC:[id number]  Admit date - 04/20/2016   Admitting Physician [name], MD  Outpatient Primary MD for the patient is [name], MD  LOS - 5  Outpatient Specialists none  Chief Complaint  Patient presents with  . Shortness of Breath       Brief Narrative   83 year old female with recent hospitalization for aspiration pneumonia was discharged to SNF on oxygen was found to be hypoxic to the 80s, fever of 100.30F and tachycardic to 120s. Lactic acid on admission was 1.77 with significant leukocytosis (19.6 K) chest x-ray showed bilateral infiltrates. She was started on empiric Zosyn in the ED and 1 L IV normal saline bolus given. Patient admitted for acute hypoxic respiratory failure secondary to recurrent aspiration pneumonia. Flu PCR was positive and started on Tamiflu.    Subjective:   Still  On 5 L via nasal cannula. Feels extremely nauseous requesting to discontinue current antibiotics.    Assessment  & Plan :    Principal Problem:   Sepsis ([place]) Secondary to influenza and possibly recurrent aspiration. Sepsis currently resolved. We'll discontinue fluids. Transition cefepime to Augmentin. Continue when necessary nebs. Continue Tamiflu. (Completed 5 day course today)    Active Problems: Acute respiratory failure with hypoxia ([facility]) Continue to flu and recurrent aspiration. Still requiring 5 L O2 via nasal cannula. Reportedly was wheezy this morning. I will repeat her chest x-ray. Requiring when necessary nebs. D/c fluids Check 2 D echo.    Aspiration pneumonia (HCC) Antibiotic switched to Augmentin. Will  complete 10 day course. Dysphagia level III diet per SLP.  Influenza A As above. Completes 5 day course of Tamiflu today.  Hypokalemia Replenish  Severe nausea Patient attributes this to be associated with IV abx. I have already switched her to oral Augmentin. Continue when necessary Zofran. Added scheduled low-dose Reglan.    Essential hypertension Stable. Continue home medications  Hypokalemia Replenished    Protein-calorie malnutrition, severe (HCC) Continue supplement.  Chronic pain On oxycodone 40 mg every 8  hours.   GERD Continue PPI      Code Status : Full code  Family Communication  : None at bedside  Disposition Plan  : SNF once hypoxia improves  Barriers For Discharge : Active symptoms, hypoxic respiratory failure, ongoing nausea  Consults  : None  Procedures  : None  DVT Prophylaxis  : Heparin  Lab Results  Component Value Date   PLT 669 (H) 04/25/2016    Antibiotics  :   Anti-infectives    Start     Dose/Rate Route Frequency Ordered Stop   04/25/16 1000  amoxicillin-clavulanate (AUGMENTIN) 875-125 MG per tablet 1 tablet     1 tablet Oral Every 12 hours 04/25/16 0904     04/25/16 0915  doxycycline (VIBRA-TABS) tablet 100 mg  Status:  Discontinued     100 mg Oral Every 12 hours 04/25/16 0900 04/25/16 0904   04/24/16 2200  ceFEPIme (MAXIPIME) 1 g in dextrose 5 % 50 mL IVPB  Status:  Discontinued     1 g 100 mL/hr over 30 Minutes Intravenous Every 12 hours 04/24/16 1038 04/25/16 0900   04/24/16 1100  ceFEPIme (MAXIPIME) 2 g in dextrose 5 % 50 mL IVPB     2 g 100 mL/hr over 30 Minutes Intravenous  Once 04/24/16 1038 04/24/16 1136   04/21/16 1000  oseltamivir (TAMIFLU) capsule 75 mg  Status:  Discontinued     75 mg Oral 2 times daily 04/21/16 0845 04/21/16 0906   04/21/16 1000  oseltamivir (TAMIFLU) capsule 30 mg     30 mg Oral 2 times daily 04/21/16 0906 04/26/16 0959   04/21/16 0600  vancomycin (VANCOCIN) 500 mg in sodium chloride 0.9 % 100  mL IVPB  Status:  Discontinued     500 mg 100 mL/hr over 60 Minutes Intravenous Every 12 hours 04/20/16 1650 04/20/16 1703   04/21/16 0200  piperacillin-tazobactam (ZOSYN) IVPB 3.375 g  Status:  Discontinued     3.375 g 12.5 mL/hr over 240 Minutes Intravenous Every 8 hours 04/20/16 1815 04/24/16 0920   04/20/16 1715  piperacillin-tazobactam (ZOSYN) IVPB 3.375 g     3.375 g 12.5 mL/hr over 240 Minutes Intravenous  Once 04/20/16 1703 04/20/16 1859   04/20/16 1700  ceFEPIme (MAXIPIME) 1 g in dextrose 5 % 50 mL IVPB  Status:  Discontinued     1 g 100 mL/hr over 30 Minutes Intravenous Every 12 hours 04/20/16 1650 04/20/16 1703   04/20/16 1515  ceFEPIme (MAXIPIME) 2 g in dextrose 5 % 50 mL IVPB     2 g 100 mL/hr over 30 Minutes Intravenous  Once 04/20/16 1508 04/20/16 1614   04/20/16 1515  vancomycin (VANCOCIN) IVPB 1000 mg/200 mL premix     1,000 mg 200 mL/hr over 60 Minutes Intravenous  Once 04/20/16 1508 04/20/16 1758        Objective:   Vitals:   04/24/16 1104 04/24/16 1424 04/24/16 2111 04/25/16 0400  BP: (!) 152/66 (!) 147/80 137/68 (!) 149/77  Pulse: (!) 102 93 (!) 103 99  Resp: 20 19 (!) 22 (!) 21  Temp: 98.6 F (37 C) 97.7 F (36.5 C) 98.1 F (36.7 C) 98.8 F (37.1 C)  TempSrc: Oral Oral Oral Oral  SpO2: 97% 95% 97% 97%  Weight:      Height:        Wt Readings from Last 3 Encounters:  04/20/16 61.2 kg (135 lb)  03/29/16 61.4 kg (135 lb 6.4 oz)  03/28/16 64.4 kg (142 lb)  Intake/Output Summary (Last 24 hours) at 04/25/16 1333 Last data filed at 04/25/16 0649  Gross per 24 hour  Intake          2106.25 ml  Output              600 ml  Net          1506.25 ml     Physical Exam  Gen: Appears fatigued and weak HEENT: , moist mucosa, supple neck Chest: At or rhonchi bilaterally CVS: N S1&S2, no murmurs, rubs or gallop GI: soft, NT, ND, BS+ Musculoskeletal: warm, no edema     Data Review:    CBC  Recent Labs Lab 04/20/16 1453 04/21/16 0557  04/22/16 0622 04/25/16 0547  WBC 19.6* 12.1* 11.3* 10.1  HGB 11.4* 9.3* 9.0* 9.3*  HCT 36.6 30.9* 28.6* 31.3*  PLT 755* 666* 679* 669*  MCV 78.4 77.1* 78.1 80.5  MCH 24.4* 23.2* 24.6* 23.9*  MCHC 31.1 30.1 31.5 29.7*  RDW 17.4* 17.4* 17.7* 18.4*    Chemistries   Recent Labs Lab 04/20/16 1453 04/21/16 0557 04/22/16 0622 04/25/16 0547  NA 134* 134* 136 135  K 5.2* 3.9 3.6 3.4*  CL 99* 102 106 103  CO2 25 22 22  21*  GLUCOSE 142* 104* 118* 100*  BUN 13 11 9  5*  CREATININE 0.70 0.60 0.64 0.52  CALCIUM 9.0 8.3* 8.3* 8.2*  AST 47* 28  --  54*  ALT 36 27  --  30  ALKPHOS 97 79  --  281*  BILITOT 0.7 0.5  --  0.9   ------------------------------------------------------------------------------------------------------------------ No results for input(s): CHOL, HDL, LDLCALC, TRIG, CHOLHDL, LDLDIRECT in the last 72 hours.  Lab Results  Component Value Date   HGBA1C 6.0 (H) 03/24/2016   ------------------------------------------------------------------------------------------------------------------ No results for input(s): TSH, T4TOTAL, T3FREE, THYROIDAB in the last 72 hours.  Invalid input(s): FREET3 ------------------------------------------------------------------------------------------------------------------ No results for input(s): VITAMINB12, FOLATE, FERRITIN, TIBC, IRON, RETICCTPCT in the last 72 hours.  Coagulation profile No results for input(s): INR, PROTIME in the last 168 hours.  No results for input(s): DDIMER in the last 72 hours.  Cardiac Enzymes  Recent Labs Lab 04/20/16 1453  TROPONINI 0.06*   ------------------------------------------------------------------------------------------------------------------ No results found for: BNP  Inpatient Medications  Scheduled Meds: . amoxicillin-clavulanate  1 tablet Oral Q12H  . aspirin EC  81 mg Oral QHS  . feeding supplement (ENSURE ENLIVE)  237 mL Oral BID BM  . heparin  5,000 Units Subcutaneous Q8H   . metoprolol  50 mg Oral BID  . oseltamivir  30 mg Oral BID  . oxyCODONE  40 mg Oral Q8H  . pantoprazole  40 mg Oral BID  . polyethylene glycol  17 g Oral BID  . saccharomyces boulardii  250 mg Oral Daily  . sodium chloride flush  3 mL Intravenous Q12H   Continuous Infusions: . sodium chloride 75 mL/hr at 04/25/16 0657   PRN Meds:.acetaminophen **OR** acetaminophen, hydrOXYzine, ipratropium-albuterol, metoCLOPramide, ondansetron (ZOFRAN) IV  Micro Results Recent Results (from the past 240 hour(s))  Blood culture (routine x 2)     Status: None (Preliminary result)   Collection Time: 04/20/16  3:29 PM  Result Value Ref Range Status   Specimen Description BLOOD RIGHT WRIST  Final   Special Requests IN PEDIATRIC BOTTLE 2CC  Final   Culture   Final    NO GROWTH 4 DAYS Performed at [facility]    Report Status PENDING  Incomplete  Blood culture (routine x 2)  Status: None (Preliminary result)   Collection Time: 04/20/16  3:31 PM  Result Value Ref Range Status   Specimen Description BLOOD LEFT HAND  Final   Special Requests BOTTLES DRAWN AEROBIC ONLY 5CC  Final   Culture   Final    NO GROWTH 4 DAYS Performed at [facility]    Report Status PENDING  Incomplete  MRSA PCR Screening     Status: None   Collection Time: 04/20/16  9:52 PM  Result Value Ref Range Status   MRSA by PCR NEGATIVE NEGATIVE Final    Comment:        The GeneXpert MRSA Assay (FDA approved for NASAL specimens only), is one component of a comprehensive MRSA colonization surveillance program. It is not intended to diagnose MRSA infection nor to guide or monitor treatment for MRSA infections.     Radiology Reports Dg Chest 2 View  Result Date: 04/20/2016 CLINICAL DATA:  Shortness of breath. EXAM: CHEST  2 VIEW COMPARISON:  Two-view chest x-ray 03/28/2016. FINDINGS: The heart size is normal. Lung volumes are low. New by lateral lower lobe airspace disease is present, left greater than  right. Bilateral pleural effusions are present. The upper lung fields are clear. The visualized soft tissues and bony thorax are unremarkable. IMPRESSION: 1. Bilateral lower lobe pneumonia. 2. Bilateral pleural effusions. Electronically Signed   By: San Morelle M.D.   On: 04/20/2016 14:38   Dg Chest 2 View  Result Date: 03/28/2016 CLINICAL DATA:  Abdominal pain and diarrhea. Aspiration pneumonia. Cough and vomiting. EXAM: CHEST  2 VIEW COMPARISON:  03/23/2016 and CT chest 09/06/2010. FINDINGS: Rightward tracheal deviation is unchanged from 09/06/2010. Heart size within normal limits. Lungs are clear. Right hemidiaphragm is chronically elevated. No pleural fluid. Humeral heads are high riding. Degenerative changes are seen in the acromioclavicular joints and spine. IMPRESSION: No acute findings. Electronically Signed   By: Lorin Picket M.D.   On: 03/28/2016 07:20   Ct Abdomen Pelvis W Contrast  Result Date: 03/28/2016 CLINICAL DATA:  Abdominal pain with diarrhea and elevated white blood cell count EXAM: CT ABDOMEN AND PELVIS WITH CONTRAST TECHNIQUE: Multidetector CT imaging of the abdomen and pelvis was performed using the standard protocol following bolus administration of intravenous contrast. CONTRAST:  100 mL Isovue 370 nonionic COMPARISON:  March 23, 2016 January 24, 2014; Sep 06, 2010 FINDINGS: Lower chest: There is persistent reticulonodular interstitial lung disease in the right lower lobe. There is incomplete visualization of a nodular opacity superior segment right lower lobe measuring 7 x 5 mm, seen on the initial CT slice. There are breast implants bilaterally. There is a sizable hiatal type hernia. There are multiple foci of coronary artery calcification. Hepatobiliary: There are calcified granulomas in the liver, unchanged. There are tiny probable cysts in the anterior aspect of the liver on the right near the dome. No new liver lesions evident. The gallbladder wall is not  appreciably thickened. There is no biliary duct dilatation. Pancreas: No pancreatic mass or inflammatory focus. The pancreatic tail and body are near the inferior aspect of the hiatal hernia without compromise, unchanged from several days prior. Spleen: Areas of scarring in the spleen are stable. 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 No acute appearing splenic lesions are evident currently. Adrenals/Urinary Tract: Right adrenal appears normal. Mild hypertrophy of the left adrenal is stable. No new adrenal lesions evident. There are subcentimeter cysts in each kidney. There is no hydronephrosis  on either side. There is no appreciable thickening in the perinephric fascia on either side. There is no renal or ureteral calculus on either side. Urinary bladder is decompressed with a Foley catheter. Air within the urinary bladder may well be due to instrumentation from Foley catheter placement. The wall of the urinary bladder is not grossly thickened. Stomach/Bowel: There are multiple colonic diverticula without diverticulitis. There is no appreciable bowel wall or mesenteric thickening. There is no evident bowel obstruction. No free air or portal venous air. Vascular/Lymphatic: There is atherosclerotic calcification in the aorta common iliac arteries. There is also atherosclerotic calcification in both common femoral arteries and hypogastric arteries. No aneurysm evident. Major mesenteric vessels appear patent, although there is moderate atherosclerotic calcification in the proximal celiac and superior mesenteric arteries. No adenopathy is appreciable in the abdomen or pelvis. Reproductive: Uterus is absent. There is no pelvic mass or pelvic fluid collection. Other: Appendix is absent. There is no abscess or ascites in the abdomen or pelvis. There is a small ventral hernia containing only fat. Musculoskeletal: There is extensive postoperative change in the lumbar spine.  There is moderate osteoporosis. There is multilevel arthropathy. No frank blastic or lytic bone lesions are evident. There is no intramuscular lesion. Stranding in the fat of the lateral pelvic walls may be due to stasis phenomenon. IMPRESSION: Persistent reticulonodular opacity in the right lower lobe, concerning for pneumonia or possibly aspiration. There is a 7 x 5 mm nodular opacity within this area. Non-contrast chest CT at 6-12 months is recommended. If the nodule is stable at time of repeat CT, then future CT at 18-24 months (from today's scan) is considered optional for low-risk patients, but is recommended for high-risk patients. This recommendation follows the consensus statement: Guidelines for Management of Incidental Pulmonary Nodules Detected on CT Images: From the Fleischner Society 2017; Radiology 2017; 284:228-243. Sizable hiatal hernia, unchanged. Multiple colonic diverticula without diverticulitis. No bowel obstruction. No abscess. Appendix and uterus absent. Extensive postoperative change in the lumbar spine with scoliosis and osteoporosis. Aortoiliac atherosclerosis without aneurysm. There is coronary artery calcification. No hydronephrosis. No renal or ureteral calculi. Air within the urinary bladder may well be present secondary to recent instrumentation. Foley catheter noted within urinary bladder. 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 No acute splenic infarct evident. Left adrenal hypertrophy, unchanged. Electronically Signed   By: Lowella Grip III M.D.   On: 03/28/2016 07:47    Time Spent in minutes  25   Louellen Molder M.D on 04/25/2016 at 1:33 PM  Between 7am to 7pm - Pager - [phone]  After 7pm go to [email or web address] - password [facility]  [facility] -  Office  [phone]

## 2016-04-25 NOTE — Care Management Note (Signed)
Case Management Note  Patient Details  Name: [name] MRN: [id number] Date of Birth: 08/01/1932  Subjective/Objective:  83 y/o f admitted w/Sepsis. Readmit. From SNF-[facility]. CSW following for return.                  Action/Plan:d/c SNF.   Expected Discharge Date:   (unknown)               Expected Discharge Plan:  Skilled Nursing Facility  In-House Referral:  Clinical Social Work  Discharge planning Services  CM Consult  Post Acute Care Choice:    Choice offered to:     DME Arranged:    DME Agency:     HH Arranged:    [place] Agency:     Status of Service:  In process, will continue to follow  If discussed at Long Length of Stay Meetings, dates discussed:    Additional Comments:  Dessa Phi, RN 04/25/2016, 1:58 PM

## 2016-04-25 NOTE — Care Management Important Message (Addendum)
Important Message  Patient Details IM Letter given to Kathy/Case Manager to present to Patient Name: [name] MRN: [id number] Date of Birth: 1932/06/09   Medicare Important Message Given:  Yes    Kerin Salen 04/25/2016, 3:00 [place] Message  Patient Details  Name: [name] MRN: [id number] Date of Birth: 12/21/1932   Medicare Important Message Given:  Yes    Kerin Salen 04/25/2016, 3:00 PM

## 2016-04-25 NOTE — Care Management Note (Deleted)
Case Management Note  Patient Details  Name: [name] MRN: [id number] Date of Birth: [birth year]  Subjective/Objective:   83 y/o f admitted w/Sepsis. From home-[facility]. PT cons-await recc.                 Action/Plan:d/c plan home.   Expected Discharge Date:   (unknown)               Expected Discharge Plan:  [place]  In-House Referral:  Clinical Social Work  Discharge planning Services  CM Consult  Post Acute Care Choice:    Choice offered to:     DME Arranged:    DME Agency:     HH Arranged:    [place] Agency:     Status of Service:  In process, will continue to follow  If discussed at Long Length of Stay Meetings, dates discussed:    Additional Comments:  Dessa Phi, RN 04/25/2016, 1:50 PM

## 2016-04-25 NOTE — Progress Notes (Signed)
  Echocardiogram 2D Echocardiogram has been performed.  Tresa Res 04/25/2016, 4:06 PM

## 2016-04-26 MED ORDER — POTASSIUM CHLORIDE CRYS ER 20 MEQ PO TBCR: 20.0000 meq | EXTENDED_RELEASE_TABLET | Freq: Once | ORAL | Status: DC

## 2016-04-26 MED ORDER — AMOXICILLIN-POT CLAVULANATE 875-125 MG PO TABS: 1.0000 | ORAL_TABLET | Freq: Two times a day (BID) | ORAL | 0 refills | Status: AC

## 2016-04-26 MED ORDER — OXYCODONE HCL ER 40 MG PO T12A: 40.0000 mg | EXTENDED_RELEASE_TABLET | Freq: Three times a day (TID) | ORAL | 0 refills | Status: DC

## 2016-04-26 NOTE — Progress Notes (Signed)
Pt being discharged from the unit via [facility]. Transport has been called. Report called to [facility]. Report given to Hinton Dyer, Therapist, sports. No questions or concerns at this time. Dalynn Jhaveri W Iain Sawchuk, RN

## 2016-04-26 NOTE — Clinical Social Work Placement (Signed)
   CLINICAL SOCIAL WORK PLACEMENT  NOTE  Date:  04/26/2016  Patient Details  Name: LAURA BALBUENA MRN: [id number] Date of Birth: 11/13/1932  Clinical Social Work is seeking post-discharge placement for this patient at the   level of care (*CSW will initial, date and re-position this form in  chart as items are completed):  Yes   Patient/family provided with [place] Work Department's list of facilities offering this level of care within the geographic area requested by the patient (or if unable, by the patient's family).  Yes   Patient/family informed of their freedom to choose among providers that offer the needed level of care, that participate in Medicare, Medicaid or managed care program needed by the patient, have an available bed and are willing to accept the patient.  Yes   Patient/family informed of [place]'s ownership interest in [facility] and [facility], as well as of the fact that they are under no obligation to receive care at these facilities.  PASRR submitted to EDS on       PASRR number received on       Existing PASRR number confirmed on 04/26/16     FL2 transmitted to all facilities in geographic area requested by pt/family on       FL2 transmitted to all facilities within larger geographic area on       Patient informed that his/her managed care company has contracts with or will negotiate with certain facilities, including the following:   [employer] informed of bed offers received.  Patient chooses bed at     [facility]  Physician recommends and patient chooses bed at     [facility] Patient to be transferred to   on 04/26/16.  Patient to be transferred to facility by PTAR     Patient family notified on 04/26/16 of transfer.  Name of family member notified:  Son     PHYSICIAN Please sign FL2     Additional Comment:    _______________________________________________ Lia Hopping,  LCSW 04/26/2016, 12:23 PM

## 2016-04-26 NOTE — Discharge Summary (Addendum)
Physician Discharge Summary  [name] [id number] DOB: [birth year] DOA: 04/20/2016  PCP: [name], MD  Admit date: 04/20/2016 Discharge date: 04/26/2016  Time spent: 25 minutes  Recommendations for Outpatient Follow-up:  1. Follow up PCP in 2 weeks 2. Check BMP in one week   Discharge Diagnoses:  Principal Problem:   Sepsis ([place]) Active Problems:   Leukocytosis   Sinus tachycardia   Essential hypertension   Aspiration pneumonia (HCC)   Protein-calorie malnutrition, severe ([place])   Discharge Condition: Stable  Diet recommendation: Dysphagia 3 diet  Filed Weights   04/20/16 1420  Weight: 61.2 kg (135 lb)    History of present illness:  83 y.o.femalewith a history of recent admission for aspiration pneumonia in 12/17, discharged on oxygen, who presented from SNF after being found to be hypoxic to the 80's. On arrival that patient had no specific complaints, but was noted to have Tmax of 100.85F with tachycardia to the 120's. Presenting lactate of 1.77. WBC noted to be 19.6. CXR demonstrated evidence of bilateral infiltrates. Patient was started on empiric zosyn in the ED with 1L bolus given. She was admitted for further management of hypoxemic respiratory failure due to presumed recurrent aspiration pneumonia. Flu PCR was positive and tamiflu was started. She has shown limited improvement, still requiring 5Loxygen  Hospital Course:  1. Acute on chronic hypoxemic respiratory failure- resolved,  from influenza and suspected aspiration pneumonia. Completed  Tamiflu for 5 days(01/04 - 01/08). Blood cultures are negative to date. 2 D Echo showed grade 1 diastolic dysfunction.Antibiotics changed to Augmentin . Will discharge on Augmentin 1 tab po bid x 7 days. 2. Nausea- likely from Antibiotics, resolved. Continue prn phenergan. 3. Hypertension- stable, continue metoprolol 50 mg twice a day. 4. Chronic pain syndrome- continue Oxycodone 40 mg po q  8 hrs. 5. Protein calorie  malnutrition- stable. Start ensure 1 can by mouth 2 times a day 6. Hypokalemia- Potassium is 3.4, will replace potassium 20 meq po x1. Check BMP in one week  Procedures:  None   Consultations:  None   Discharge Exam: Vitals:   04/25/16 2038 04/26/16 0443  BP: (!) 146/69 (!) 142/75  Pulse: (!) 101 99  Resp: 20 18  Temp: 98.2 F (36.8 C) 98.7 F (37.1 C)    General: Appears in no acute distress Cardiovascular: RRR, S1S2 Respiratory: Clear bilaterally  Discharge Instructions   Discharge Instructions    Diet - low sodium heart healthy    Complete by:  As directed    Increase activity slowly    Complete by:  As directed      Current Discharge Medication List    START taking these medications   Details  amoxicillin-clavulanate (AUGMENTIN) 875-125 MG tablet Take 1 tablet by mouth every 12 (twelve) hours. Qty: 14 tablet, Refills: 0      CONTINUE these medications which have CHANGED   Details  oxyCODONE (OXYCONTIN) 40 mg 12 hr tablet Take 1 tablet (40 mg total) by mouth every 8 (eight) hours. Qty: 14 tablet, Refills: 0      CONTINUE these medications which have NOT CHANGED   Details  acetaminophen (TYLENOL) 325 MG tablet Take 650 mg by mouth every 4 (four) hours as needed for fever.    aspirin 81 MG tablet Take 81 mg by mouth at bedtime.     ipratropium-albuterol (DUONEB) 0.5-2.5 (3) MG/3ML SOLN Take 3 mLs by nebulization every 4 (four) hours as needed (shortness of breath/wheezing).    metoprolol (LOPRESSOR) 50 MG  tablet Take 50 mg by mouth 2 (two) times daily.    Multiple Vitamin (MULTIVITAMIN WITH MINERALS) TABS tablet Take 1 tablet by mouth daily.    pantoprazole (PROTONIX) 40 MG tablet Take 40 mg by mouth 2 (two) times daily.    polyethylene glycol (MIRALAX / GLYCOLAX) packet Take 17 g by mouth 2 (two) times daily. Qty: 14 each, Refills: 0    promethazine (PHENERGAN) 25 MG tablet Take 25 mg by mouth every 4 (four) hours as needed for nausea or vomiting.     saccharomyces boulardii (FLORASTOR) 250 MG capsule Take 250 mg by mouth daily.       Allergies  Allergen Reactions  . Ciprofloxacin Rash    burning    Contact information for follow-up providers    [name], MD.   Specialty:  Family Medicine Contact information: [id number] [place] [address] [phone]            Contact information for after-discharge care    Destination    [facility] .   Specialty:  [place] information: [address] [place] [phone]                   The results of significant diagnostics from this hospitalization (including imaging, microbiology, ancillary and laboratory) are listed below for reference.    Significant Diagnostic Studies: Dg Chest 2 View  Result Date: 04/25/2016 CLINICAL DATA:  Acute hypoxic respiratory failure. EXAM: CHEST  2 VIEW COMPARISON:  04/20/2016 and 03/28/2016 FINDINGS: Again noted are right perihilar densities which have not significantly changed from the previous examination. There is chronic elevation of the right hemidiaphragm. There continues to be densities at left lung base suggestive for pleural fluid and probably airspace disease. Atherosclerotic calcifications in the aortic arch. Heart size is grossly stable. Again noted is surgical hardware in the lumbar spine. IMPRESSION: Persistent densities in the lower chest bilaterally. Concern for airspace disease and pleural fluid, particularly on the left side. Findings could represent pneumonia. Recommend continued follow up to ensure resolution or further characterization with CT. Electronically Signed   By: Markus Daft M.D.   On: 04/25/2016 15:54   Dg Chest 2 View  Result Date: 04/20/2016 CLINICAL DATA:  Shortness of breath. EXAM: CHEST  2 VIEW COMPARISON:  Two-view chest x-ray 03/28/2016. FINDINGS: The heart size is normal. Lung volumes are low.  New by lateral lower lobe airspace disease is present, left greater than right. Bilateral pleural effusions are present. The upper lung fields are clear. The visualized soft tissues and bony thorax are unremarkable. IMPRESSION: 1. Bilateral lower lobe pneumonia. 2. Bilateral pleural effusions. Electronically Signed   By: San Morelle M.D.   On: 04/20/2016 14:38   Dg Chest 2 View  Result Date: 03/28/2016 CLINICAL DATA:  Abdominal pain and diarrhea. Aspiration pneumonia. Cough and vomiting. EXAM: CHEST  2 VIEW COMPARISON:  03/23/2016 and CT chest 09/06/2010. FINDINGS: Rightward tracheal deviation is unchanged from 09/06/2010. Heart size within normal limits. Lungs are clear. Right hemidiaphragm is chronically elevated. No pleural fluid. Humeral heads are high riding. Degenerative changes are seen in the acromioclavicular joints and spine. IMPRESSION: No acute findings. Electronically Signed   By: Lorin Picket M.D.   On: 03/28/2016 07:20   Ct Abdomen Pelvis W Contrast  Result Date: 03/28/2016 CLINICAL DATA:  Abdominal pain with diarrhea and elevated white blood cell count EXAM: CT ABDOMEN AND PELVIS WITH CONTRAST TECHNIQUE:  Multidetector CT imaging of the abdomen and pelvis was performed using the standard protocol following bolus administration of intravenous contrast. CONTRAST:  100 mL Isovue 370 nonionic COMPARISON:  March 23, 2016 January 24, 2014; Sep 06, 2010 FINDINGS: Lower chest: There is persistent reticulonodular interstitial lung disease in the right lower lobe. There is incomplete visualization of a nodular opacity superior segment right lower lobe measuring 7 x 5 mm, seen on the initial CT slice. There are breast implants bilaterally. There is a sizable hiatal type hernia. There are multiple foci of coronary artery calcification. Hepatobiliary: There are calcified granulomas in the liver, unchanged. There are tiny probable cysts in the anterior aspect of the liver on the right near  the dome. No new liver lesions evident. The gallbladder wall is not appreciably thickened. There is no biliary duct dilatation. Pancreas: No pancreatic mass or inflammatory focus. The pancreatic tail and body are near the inferior aspect of the hiatal hernia without compromise, unchanged from several days prior. Spleen: Areas of scarring in the spleen are stable. 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 No acute appearing splenic lesions are evident currently. Adrenals/Urinary Tract: Right adrenal appears normal. Mild hypertrophy of the left adrenal is stable. No new adrenal lesions evident. There are subcentimeter cysts in each kidney. There is no hydronephrosis on either side. There is no appreciable thickening in the perinephric fascia on either side. There is no renal or ureteral calculus on either side. Urinary bladder is decompressed with a Foley catheter. Air within the urinary bladder may well be due to instrumentation from Foley catheter placement. The wall of the urinary bladder is not grossly thickened. Stomach/Bowel: There are multiple colonic diverticula without diverticulitis. There is no appreciable bowel wall or mesenteric thickening. There is no evident bowel obstruction. No free air or portal venous air. Vascular/Lymphatic: There is atherosclerotic calcification in the aorta common iliac arteries. There is also atherosclerotic calcification in both common femoral arteries and hypogastric arteries. No aneurysm evident. Major mesenteric vessels appear patent, although there is moderate atherosclerotic calcification in the proximal celiac and superior mesenteric arteries. No adenopathy is appreciable in the abdomen or pelvis. Reproductive: Uterus is absent. There is no pelvic mass or pelvic fluid collection. Other: Appendix is absent. There is no abscess or ascites in the abdomen or pelvis. There is a small ventral hernia containing only fat.  Musculoskeletal: There is extensive postoperative change in the lumbar spine. There is moderate osteoporosis. There is multilevel arthropathy. No frank blastic or lytic bone lesions are evident. There is no intramuscular lesion. Stranding in the fat of the lateral pelvic walls may be due to stasis phenomenon. IMPRESSION: Persistent reticulonodular opacity in the right lower lobe, concerning for pneumonia or possibly aspiration. There is a 7 x 5 mm nodular opacity within this area. Non-contrast chest CT at 6-12 months is recommended. If the nodule is stable at time of repeat CT, then future CT at 18-24 months (from today's scan) is considered optional for low-risk patients, but is recommended for high-risk patients. This recommendation follows the consensus statement: Guidelines for Management of Incidental Pulmonary Nodules Detected on CT Images: From the Fleischner Society 2017; Radiology 2017; 284:228-243. Sizable hiatal hernia, unchanged. Multiple colonic diverticula without diverticulitis. No bowel obstruction. No abscess. Appendix and uterus absent. Extensive postoperative change in the lumbar spine with scoliosis and osteoporosis. Aortoiliac atherosclerosis without aneurysm. There is coronary artery calcification. No hydronephrosis. No renal or ureteral calculi. Air within  the urinary bladder may well be present secondary to recent instrumentation. Foley catheter noted within urinary bladder. 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 No acute splenic infarct evident. Left adrenal hypertrophy, unchanged. Electronically Signed   By: Lowella Grip III M.D.   On: 03/28/2016 07:47    Microbiology: Recent Results (from the past 240 hour(s))  Blood culture (routine x 2)     Status: None   Collection Time: 04/20/16  3:29 PM  Result Value Ref Range Status   Specimen Description BLOOD RIGHT WRIST  Final   Special Requests IN PEDIATRIC BOTTLE [place]  Final   Culture   Final    NO GROWTH 5  DAYS Performed at [facility]    Report Status 04/25/2016 FINAL  Final  Blood culture (routine x 2)     Status: None   Collection Time: 04/20/16  3:31 PM  Result Value Ref Range Status   Specimen Description BLOOD LEFT HAND  Final   Special Requests BOTTLES DRAWN AEROBIC ONLY 5CC  Final   Culture   Final    NO GROWTH 5 DAYS Performed at [facility]    Report Status 04/25/2016 FINAL  Final  MRSA PCR Screening     Status: None   Collection Time: 04/20/16  9:52 PM  Result Value Ref Range Status   MRSA by PCR NEGATIVE NEGATIVE Final    Comment:        The GeneXpert MRSA Assay (FDA approved for NASAL specimens only), is one component of a comprehensive MRSA colonization surveillance program. It is not intended to diagnose MRSA infection nor to guide or monitor treatment for MRSA infections.      Labs: Basic Metabolic Panel:  Recent Labs Lab 04/20/16 1453 04/21/16 0557 04/22/16 0622 04/25/16 0547  NA 134* 134* 136 135  K 5.2* 3.9 3.6 3.4*  CL 99* 102 106 103  CO2 25 22 22  21*  GLUCOSE 142* 104* 118* 100*  BUN 13 11 9  5*  CREATININE 0.70 0.60 0.64 0.52  CALCIUM 9.0 8.3* 8.3* 8.2*   Liver Function Tests:  Recent Labs Lab 04/20/16 1453 04/21/16 0557 04/25/16 0547  AST 47* 28 54*  ALT 36 27 30  ALKPHOS 97 79 281*  BILITOT 0.7 0.5 0.9  PROT 7.8 6.2* 5.8*  ALBUMIN 2.7* 2.3* 2.2*   No results for input(s): LIPASE, AMYLASE in the last 168 hours. No results for input(s): AMMONIA in the last 168 hours. CBC:  Recent Labs Lab 04/20/16 1453 04/21/16 0557 04/22/16 0622 04/25/16 0547  WBC 19.6* 12.1* 11.3* 10.1  HGB 11.4* 9.3* 9.0* 9.3*  HCT 36.6 30.9* 28.6* 31.3*  MCV 78.4 77.1* 78.1 80.5  PLT 755* 666* 679* 669*   Cardiac Enzymes:  Recent Labs Lab 04/20/16 1453  TROPONINI 0.06*       Signed:  Eleonore Chiquito S MD.  [facility] 04/26/2016, 11:08 AM

## 2016-04-27 ENCOUNTER — Other Ambulatory Visit: Payer: Self-pay | Admitting: *Deleted

## 2016-04-27 MED ORDER — OXYCODONE HCL ER 40 MG PO T12A: EXTENDED_RELEASE_TABLET | ORAL | 0 refills | Status: DC

## 2016-04-27 NOTE — Telephone Encounter (Signed)
[facility] #: [phone] Fax#: [phone]  

## 2016-04-29 ENCOUNTER — Non-Acute Institutional Stay (SKILLED_NURSING_FACILITY): Payer: Medicare Other | Admitting: Adult Health

## 2016-04-29 DIAGNOSIS — J9611 Chronic respiratory failure with hypoxia: Secondary | ICD-10-CM

## 2016-04-29 DIAGNOSIS — I10 Essential (primary) hypertension: Secondary | ICD-10-CM

## 2016-04-29 DIAGNOSIS — M150 Primary generalized (osteo)arthritis: Secondary | ICD-10-CM | POA: Diagnosis not present

## 2016-04-29 DIAGNOSIS — E43 Unspecified severe protein-calorie malnutrition: Secondary | ICD-10-CM

## 2016-04-29 DIAGNOSIS — G894 Chronic pain syndrome: Secondary | ICD-10-CM

## 2016-04-29 DIAGNOSIS — J690 Pneumonitis due to inhalation of food and vomit: Secondary | ICD-10-CM | POA: Diagnosis not present

## 2016-04-29 DIAGNOSIS — D509 Iron deficiency anemia, unspecified: Secondary | ICD-10-CM

## 2016-04-29 DIAGNOSIS — M159 Polyosteoarthritis, unspecified: Secondary | ICD-10-CM

## 2016-05-03 ENCOUNTER — Non-Acute Institutional Stay (SKILLED_NURSING_FACILITY): Payer: Medicare Other | Admitting: Internal Medicine

## 2016-05-03 ENCOUNTER — Encounter: Payer: Self-pay | Admitting: Internal Medicine

## 2016-05-03 DIAGNOSIS — R5381 Other malaise: Secondary | ICD-10-CM | POA: Diagnosis not present

## 2016-05-03 DIAGNOSIS — R110 Nausea: Secondary | ICD-10-CM | POA: Diagnosis not present

## 2016-05-03 DIAGNOSIS — D649 Anemia, unspecified: Secondary | ICD-10-CM | POA: Insufficient documentation

## 2016-05-03 DIAGNOSIS — E43 Unspecified severe protein-calorie malnutrition: Secondary | ICD-10-CM

## 2016-05-03 DIAGNOSIS — J690 Pneumonitis due to inhalation of food and vomit: Secondary | ICD-10-CM

## 2016-05-03 DIAGNOSIS — M150 Primary generalized (osteo)arthritis: Secondary | ICD-10-CM

## 2016-05-03 DIAGNOSIS — M159 Polyosteoarthritis, unspecified: Secondary | ICD-10-CM

## 2016-05-03 DIAGNOSIS — I10 Essential (primary) hypertension: Secondary | ICD-10-CM | POA: Diagnosis not present

## 2016-05-03 DIAGNOSIS — G894 Chronic pain syndrome: Secondary | ICD-10-CM

## 2016-05-03 DIAGNOSIS — J9611 Chronic respiratory failure with hypoxia: Secondary | ICD-10-CM

## 2016-05-03 NOTE — Progress Notes (Signed)
Patient ID: [name], female   DOB: 19-Sep-1932, 83 y.o.   MRN: [id number]    HISTORY AND PHYSICAL   DATE: 05/03/2016  Location:    [place] Room Number: 151 C Place of Service: SNF (31)   Extended Emergency Contact Information Primary Emergency Contact: Hill-Evelena Masci,[place]  [place] Phone: [phone] Relation: Other  Advanced Directive information Does Patient Have a Medical Advance Directive?: No, Does patient want to make changes to medical advance directive?: No - Patient declined  Chief Complaint  Patient presents with  . Readmit To SNF    HPI:  83 yo female seen today as a readmission into SNF following hospital stay for acute/chronic hypoxemix respiratory failure 2/2 influenza A, sepsis, leukocytosis, sinus tachycardia, aspiration pneumonia, severe protein calorie malnutrition. CXR revealed b/l infiltrates. WBC 19.6K on admission. She was empirically tx with IV zosyn -->po augmentin (to complete 7 days of tx). Flu PCR (+) A. She was tx with tamiflu whic was completed prior to d/c. Blood cx NGTD. 2 D echo showed grade 1 DD. Hgb 9.3; Plts 669K; WBC 10.1K; K 5.2-->3.4; albumin 2.7-->2.2; AST 47-->54; ALT 36; alk phos 281 at d/c. She presents to SNF for short term rehab.  Today she reports nausea whenever she takes Augmentin. She refused meds last night and today as a result. No other concerns. Appetite ok and sleeps well. No abdominal pain. Tolerating tx. She is O2 dependent at 5L/min. No falls. No CP but has chronic SOB. She reports rib discomfort on left which makes it difficult for deep inspiration. No f/c. No falls. Nursing c/a pt refusing abx but no other concerns.  HTN - BP stable on lopressor. He also takes ASA daily.  Chronic respiratory failure/O2 dependent - she is on 5L/min [place] O2 ATC. She gets duonebs prn.  Hx urinary retention - stable. No signs of urinary issues at this time  Iron deficiency anemia - stable. Hgb  9.3  Protein calorie malnutrition - worse. Current albumin 2.2 (prev 3.4). She gets nutritional supplements per facility protocol.  GERD - stable on protonix. She takes miralax for constipation  Chronic pain syndrome/OA - stable on oxycontin TID. Benefits of narcotic use outweighs risks. No increased sedation or respiratory depression  Past Medical History:  Diagnosis Date  . Anemia 10-05-12   hgb-9.0 on 09-14-12  . Arm vein blood clot 10-05-12   Rt. arm '12  . Arthritis 10-05-12   degenerative joint disease,scoliosis spine  . Aspiration pneumonia ([place])   . Dysrhythmia 10-05-12   tachycardia -tx. Lopressor  . GERD (gastroesophageal reflux disease)   . H/O esophagitis   . H/O hiatal hernia   . Tachycardia   . Transfusion history 10-05-12   4 units blood '12    Past Surgical History:  Procedure Laterality Date  . ABDOMINAL HYSTERECTOMY    . APPENDECTOMY     child  . BACK SURGERY  10-05-12   x5-last fusion with plates  . BREAST ENHANCEMENT SURGERY    . CATARACT EXTRACTION, BILATERAL    . COLONOSCOPY WITH PROPOFOL N/A 10/23/2012   Procedure: COLONOSCOPY WITH PROPOFOL;  Surgeon: Lear Ng, MD;  Location: [facility];  Service: Endoscopy;  Laterality: N/A;  . HOT HEMOSTASIS N/A 10/23/2012   Procedure: HOT HEMOSTASIS (ARGON PLASMA COAGULATION/BICAP);  Surgeon: Lear Ng, MD;  Location: [facility];  Service: Endoscopy;  Laterality: N/A;    Patient Care Team: L.[name], MD as PCP - General  Social History  Social History  . Marital status: Widowed    Spouse name: N/A  . Number of children: N/A  . Years of education: N/A   Occupational History  . Not on file.   Social History Main Topics  . Smoking status: Never Smoker  . Smokeless tobacco: Never Used  . Alcohol use No  . Drug use: No  . Sexual activity: Not Currently   Other Topics Concern  . Not on file   Social History Narrative  . No narrative on file     reports that she has never  smoked. She has never used smokeless tobacco. She reports that she does not drink alcohol or use drugs.  Family History  Problem Relation Age of Onset  . Cancer Mother   . Cancer Father   . Diabetes Sister   . Stroke Neg Hx   . CAD Neg Hx    Family Status  Relation Status  . Mother Deceased  . Father Deceased  . Sister Deceased  . Neg Hx     Immunization History  Administered Date(s) Administered  . Influenza,inj,Quad PF,36+ Mos 01/27/2014  . PPD Test 04/26/2016    Allergies  Allergen Reactions  . Ciprofloxacin Rash    burning    Medications: Patient's Medications  New Prescriptions   No medications on file  Previous Medications   ACETAMINOPHEN (TYLENOL) 325 MG TABLET    Take 650 mg by mouth every 4 (four) hours as needed for fever.   AMOXICILLIN-CLAVULANATE (AUGMENTIN) 875-125 MG TABLET    Take 1 tablet by mouth every 12 (twelve) hours.   ASPIRIN 81 MG TABLET    Take 81 mg by mouth at bedtime.    IPRATROPIUM-ALBUTEROL (DUONEB) 0.5-2.5 (3) MG/3ML SOLN    Take 3 mLs by nebulization every 4 (four) hours as needed (shortness of breath/wheezing).   METOPROLOL (LOPRESSOR) 50 MG TABLET    Take 50 mg by mouth 2 (two) times daily.   MULTIPLE VITAMIN (MULTIVITAMIN WITH MINERALS) TABS TABLET    Take 1 tablet by mouth daily.   OXYCODONE (OXYCONTIN) 40 MG 12 HR TABLET    Take one tablet by mouth every 8 hours for pain   PANTOPRAZOLE (PROTONIX) 40 MG TABLET    Take 40 mg by mouth 2 (two) times daily.   POLYETHYLENE GLYCOL (MIRALAX / GLYCOLAX) PACKET    Take 17 g by mouth 2 (two) times daily.   PROMETHAZINE (PHENERGAN) 25 MG TABLET    Take 25 mg by mouth every 4 (four) hours as needed for nausea or vomiting.   SACCHAROMYCES BOULARDII (FLORASTOR) 250 MG CAPSULE    Take 250 mg by mouth daily.  Modified Medications   No medications on file  Discontinued Medications   No medications on file    Review of Systems  Respiratory: Positive for cough and shortness of breath.    Gastrointestinal: Positive for nausea.  Musculoskeletal: Positive for arthralgias and gait problem.  All other systems reviewed and are negative.   Vitals:   05/03/16 1122  BP: (!) 146/59  Pulse: (!) 108  Resp: 20  Temp: 97.4 F (36.3 C)  TempSrc: Oral  SpO2: 95%  Weight: 135 lb 6.4 oz (61.4 kg)  Height: 5' 3"  (1.6 m)   Body mass index is 23.99 kg/m.  Physical Exam  Constitutional: She is oriented to person, place, and time. She appears well-developed.  Frail appearing in NAD,sitting in w/c  HENT:  Mouth/Throat: Oropharynx is clear and moist. No oropharyngeal exudate.  MMM; no  oral thrush  Eyes: Pupils are equal, round, and reactive to light. No scleral icterus.  Neck: Neck supple. Carotid bruit is not present. No tracheal deviation present.  Cardiovascular: Normal rate, regular rhythm and intact distal pulses.  Exam reveals no gallop and no friction rub.   Murmur (1/6 SEM) heard. No LE edema b/l. no calf TTP.   Pulmonary/Chest: Effort normal. No stridor. No respiratory distress. She has no wheezes. She has rales (R>L base).  Reduced BS right base. no rhonchi  Abdominal: Soft. Bowel sounds are normal. She exhibits no distension and no mass. There is no hepatomegaly. There is no tenderness. There is no rebound and no guarding.  Musculoskeletal: She exhibits edema, tenderness and deformity (small joint deformities).  Lymphadenopathy:    She has no cervical adenopathy.  Neurological: She is alert and oriented to person, place, and time.  Skin: Skin is warm and dry. No rash noted.  Psychiatric: She has a normal mood and affect. Her behavior is normal. Judgment and thought content normal.     Labs reviewed: Nursing Home on 05/03/2016  Component Date Value Ref Range Status  . Hemoglobin 04/04/2016 11.4* 12.0 - 16.0 g/dL Final  . HCT 04/04/2016 39  36 - 46 % Final  . Platelets 04/04/2016 538* 150 - 399 K/L Final  . WBC 04/04/2016 7.7  10^3/mL Final  . Glucose 04/04/2016  101  mg/dL Final  . BUN 04/04/2016 15  4 - 21 mg/dL Final  . Creatinine 04/04/2016 0.6  0.5 - 1.1 mg/dL Final  . Potassium 04/04/2016 4.9  3.4 - 5.3 mmol/L Final  . Sodium 04/04/2016 137  137 - 147 mmol/L Final  Admission on 04/20/2016, Discharged on 04/26/2016  Component Date Value Ref Range Status  . WBC 04/20/2016 19.6* 4.0 - 10.5 K/uL Final  . RBC 04/20/2016 4.67  3.87 - 5.11 MIL/uL Final  . Hemoglobin 04/20/2016 11.4* 12.0 - 15.0 g/dL Final  . HCT 04/20/2016 36.6  36.0 - 46.0 % Final  . MCV 04/20/2016 78.4  78.0 - 100.0 fL Final  . MCH 04/20/2016 24.4* 26.0 - 34.0 pg Final  . MCHC 04/20/2016 31.1  30.0 - 36.0 g/dL Final  . RDW 04/20/2016 17.4* 11.5 - 15.5 % Final  . Platelets 04/20/2016 755* 150 - 400 K/uL Final  . Sodium 04/20/2016 134* 135 - 145 mmol/L Final  . Potassium 04/20/2016 5.2* 3.5 - 5.1 mmol/L Final  . Chloride 04/20/2016 99* 101 - 111 mmol/L Final  . CO2 04/20/2016 25  22 - 32 mmol/L Final  . Glucose, Bld 04/20/2016 142* 65 - 99 mg/dL Final  . BUN 04/20/2016 13  6 - 20 mg/dL Final  . Creatinine, Ser 04/20/2016 0.70  0.44 - 1.00 mg/dL Final  . Calcium 04/20/2016 9.0  8.9 - 10.3 mg/dL Final  . Total Protein 04/20/2016 7.8  6.5 - 8.1 g/dL Final  . Albumin 04/20/2016 2.7* 3.5 - 5.0 g/dL Final  . AST 04/20/2016 47* 15 - 41 U/L Final  . ALT 04/20/2016 36  14 - 54 U/L Final  . Alkaline Phosphatase 04/20/2016 97  38 - 126 U/L Final  . Total Bilirubin 04/20/2016 0.7  0.3 - 1.2 mg/dL Final  . GFR calc non Af Amer 04/20/2016 >60  >60 mL/min Final  . GFR calc Af Amer 04/20/2016 >60  >60 mL/min Final   Comment: (NOTE) The eGFR has been calculated using the CKD EPI equation. This calculation has not been validated in all clinical situations. eGFR's persistently <60 mL/min signify  possible Chronic Kidney Disease.   . Anion gap 04/20/2016 10  5 - 15 Final  . Troponin I 04/20/2016 0.06* <0.03 ng/mL Final   Comment: CRITICAL RESULT CALLED TO, READ BACK BY AND VERIFIED  WITH: A.NEASE RN AT 1540 ON 04/20/16 BY S.VANHOORNE   . Lactic Acid, Venous 04/20/2016 1.77  0.5 - 1.9 mmol/L Final  . Specimen Description 04/25/2016 BLOOD RIGHT WRIST   Final  . Special Requests 04/25/2016 IN PEDIATRIC BOTTLE 2CC   Final  . Culture 04/25/2016    Final                   Value:NO GROWTH 5 DAYS Performed at [facility]   . Report Status 04/25/2016 04/25/2016 FINAL   Final  . Specimen Description 04/25/2016 BLOOD LEFT HAND   Final  . Special Requests 04/25/2016 BOTTLES DRAWN AEROBIC ONLY 5CC   Final  . Culture 04/25/2016    Final                   Value:NO GROWTH 5 DAYS Performed at [facility]   . Report Status 04/25/2016 04/25/2016 FINAL   Final  . HIV Screen 4th Generation wRfx 04/21/2016 Non Reactive  Non Reactive Final   Comment: (NOTE) Performed At: [facility] [place], [place] [id number] Lindon Romp MD JO:[id number]   . Strep Pneumo Urinary Antigen 04/22/2016 NEGATIVE  NEGATIVE Final   Comment:        Infection due to S. pneumoniae cannot be absolutely ruled out since the antigen present may be below the detection limit of the test. Performed at [facility]   . Sodium 04/21/2016 134* 135 - 145 mmol/L Final  . Potassium 04/21/2016 3.9  3.5 - 5.1 mmol/L Final   Comment: DELTA CHECK NOTED REPEATED TO VERIFY   . Chloride 04/21/2016 102  101 - 111 mmol/L Final  . CO2 04/21/2016 22  22 - 32 mmol/L Final  . Glucose, Bld 04/21/2016 104* 65 - 99 mg/dL Final  . BUN 04/21/2016 11  6 - 20 mg/dL Final  . Creatinine, Ser 04/21/2016 0.60  0.44 - 1.00 mg/dL Final  . Calcium 04/21/2016 8.3* 8.9 - 10.3 mg/dL Final  . Total Protein 04/21/2016 6.2* 6.5 - 8.1 g/dL Final  . Albumin 04/21/2016 2.3* 3.5 - 5.0 g/dL Final  . AST 04/21/2016 28  15 - 41 U/L Final  . ALT 04/21/2016 27  14 - 54 U/L Final  . Alkaline Phosphatase 04/21/2016 79  38 - 126 U/L Final  . Total Bilirubin 04/21/2016 0.5  0.3 - 1.2 mg/dL Final  . GFR  calc non Af Amer 04/21/2016 >60  >60 mL/min Final  . GFR calc Af Amer 04/21/2016 >60  >60 mL/min Final   Comment: (NOTE) The eGFR has been calculated using the CKD EPI equation. This calculation has not been validated in all clinical situations. eGFR's persistently <60 mL/min signify possible Chronic Kidney Disease.   . Anion gap 04/21/2016 10  5 - 15 Final  . WBC 04/21/2016 12.1* 4.0 - 10.5 K/uL Final  . RBC 04/21/2016 4.01  3.87 - 5.11 MIL/uL Final  . Hemoglobin 04/21/2016 9.3* 12.0 - 15.0 g/dL Final  . HCT 04/21/2016 30.9* 36.0 - 46.0 % Final  . MCV 04/21/2016 77.1* 78.0 - 100.0 fL Final  . MCH 04/21/2016 23.2* 26.0 - 34.0 pg Final  . MCHC 04/21/2016 30.1  30.0 - 36.0 g/dL Final  . RDW 04/21/2016 17.4* 11.5 - 15.5 %  Final  . Platelets 04/21/2016 666* 150 - 400 K/uL Final  . Influenza A By PCR 04/21/2016 POSITIVE* NEGATIVE Final  . Influenza B By PCR 04/21/2016 NEGATIVE  NEGATIVE Final   Comment: (NOTE) The Xpert Xpress Flu assay is intended as an aid in the diagnosis of  influenza and should not be used as a sole basis for treatment.  This  assay is FDA approved for nasopharyngeal swab specimens only. Nasal  washings and aspirates are unacceptable for Xpert Xpress Flu testing.   Marland Kitchen MRSA by PCR 04/20/2016 NEGATIVE  NEGATIVE Final   Comment:        The GeneXpert MRSA Assay (FDA approved for NASAL specimens only), is one component of a comprehensive MRSA colonization surveillance program. It is not intended to diagnose MRSA infection nor to guide or monitor treatment for MRSA infections.   . WBC 04/22/2016 11.3* 4.0 - 10.5 K/uL Final  . RBC 04/22/2016 3.66* 3.87 - 5.11 MIL/uL Final  . Hemoglobin 04/22/2016 9.0* 12.0 - 15.0 g/dL Final  . HCT 04/22/2016 28.6* 36.0 - 46.0 % Final  . MCV 04/22/2016 78.1  78.0 - 100.0 fL Final  . MCH 04/22/2016 24.6* 26.0 - 34.0 pg Final  . MCHC 04/22/2016 31.5  30.0 - 36.0 g/dL Final  . RDW 04/22/2016 17.7* 11.5 - 15.5 % Final  . Platelets  04/22/2016 679* 150 - 400 K/uL Final  . Sodium 04/22/2016 136  135 - 145 mmol/L Final  . Potassium 04/22/2016 3.6  3.5 - 5.1 mmol/L Final  . Chloride 04/22/2016 106  101 - 111 mmol/L Final  . CO2 04/22/2016 22  22 - 32 mmol/L Final  . Glucose, Bld 04/22/2016 118* 65 - 99 mg/dL Final  . BUN 04/22/2016 9  6 - 20 mg/dL Final  . Creatinine, Ser 04/22/2016 0.64  0.44 - 1.00 mg/dL Final  . Calcium 04/22/2016 8.3* 8.9 - 10.3 mg/dL Final  . GFR calc non Af Amer 04/22/2016 >60  >60 mL/min Final  . GFR calc Af Amer 04/22/2016 >60  >60 mL/min Final   Comment: (NOTE) The eGFR has been calculated using the CKD EPI equation. This calculation has not been validated in all clinical situations. eGFR's persistently <60 mL/min signify possible Chronic Kidney Disease.   . Anion gap 04/22/2016 8  5 - 15 Final  . WBC 04/25/2016 10.1  4.0 - 10.5 K/uL Final  . RBC 04/25/2016 3.89  3.87 - 5.11 MIL/uL Final  . Hemoglobin 04/25/2016 9.3* 12.0 - 15.0 g/dL Final  . HCT 04/25/2016 31.3* 36.0 - 46.0 % Final  . MCV 04/25/2016 80.5  78.0 - 100.0 fL Final  . MCH 04/25/2016 23.9* 26.0 - 34.0 pg Final  . MCHC 04/25/2016 29.7* 30.0 - 36.0 g/dL Final  . RDW 04/25/2016 18.4* 11.5 - 15.5 % Final  . Platelets 04/25/2016 669* 150 - 400 K/uL Final  . Sodium 04/25/2016 135  135 - 145 mmol/L Final  . Potassium 04/25/2016 3.4* 3.5 - 5.1 mmol/L Final  . Chloride 04/25/2016 103  101 - 111 mmol/L Final  . CO2 04/25/2016 21* 22 - 32 mmol/L Final  . Glucose, Bld 04/25/2016 100* 65 - 99 mg/dL Final  . BUN 04/25/2016 5* 6 - 20 mg/dL Final  . Creatinine, Ser 04/25/2016 0.52  0.44 - 1.00 mg/dL Final  . Calcium 04/25/2016 8.2* 8.9 - 10.3 mg/dL Final  . Total Protein 04/25/2016 5.8* 6.5 - 8.1 g/dL Final  . Albumin 04/25/2016 2.2* 3.5 - 5.0 g/dL Final  . AST 04/25/2016  54* 15 - 41 U/L Final  . ALT 04/25/2016 30  14 - 54 U/L Final  . Alkaline Phosphatase 04/25/2016 281* 38 - 126 U/L Final  . Total Bilirubin 04/25/2016 0.9  0.3 - 1.2  mg/dL Final  . GFR calc non Af Amer 04/25/2016 >60  >60 mL/min Final  . GFR calc Af Amer 04/25/2016 >60  >60 mL/min Final   Comment: (NOTE) The eGFR has been calculated using the CKD EPI equation. This calculation has not been validated in all clinical situations. eGFR's persistently <60 mL/min signify possible Chronic Kidney Disease.   . Anion gap 04/25/2016 11  5 - 15 Final  . Weight 04/25/2016 2160  oz Final  . Height 04/25/2016 63  in Final  . BP 04/25/2016 155/58  mmHg Final  Admission on 03/28/2016, Discharged on 03/28/2016  Component Date Value Ref Range Status  . WBC 03/28/2016 11.5* 4.0 - 10.5 K/uL Final  . RBC 03/28/2016 4.65  3.87 - 5.11 MIL/uL Final  . Hemoglobin 03/28/2016 11.4* 12.0 - 15.0 g/dL Final  . HCT 03/28/2016 37.0  36.0 - 46.0 % Final  . MCV 03/28/2016 79.6  78.0 - 100.0 fL Final  . MCH 03/28/2016 24.5* 26.0 - 34.0 pg Final  . MCHC 03/28/2016 30.8  30.0 - 36.0 g/dL Final  . RDW 03/28/2016 17.9* 11.5 - 15.5 % Final  . Platelets 03/28/2016 354  150 - 400 K/uL Final  . Neutrophils Relative % 03/28/2016 78  % Final  . Neutro Abs 03/28/2016 8.9* 1.7 - 7.7 K/uL Final  . Lymphocytes Relative 03/28/2016 13  % Final  . Lymphs Abs 03/28/2016 1.5  0.7 - 4.0 K/uL Final  . Monocytes Relative 03/28/2016 8  % Final  . Monocytes Absolute 03/28/2016 1.0  0.1 - 1.0 K/uL Final  . Eosinophils Relative 03/28/2016 0  % Final  . Eosinophils Absolute 03/28/2016 0.0  0.0 - 0.7 K/uL Final  . Basophils Relative 03/28/2016 1  % Final  . Basophils Absolute 03/28/2016 0.1  0.0 - 0.1 K/uL Final  . Sodium 03/28/2016 133* 135 - 145 mmol/L Final  . Potassium 03/28/2016 4.5  3.5 - 5.1 mmol/L Final  . Chloride 03/28/2016 100* 101 - 111 mmol/L Final  . CO2 03/28/2016 23  22 - 32 mmol/L Final  . Glucose, Bld 03/28/2016 130* 65 - 99 mg/dL Final  . BUN 03/28/2016 13  6 - 20 mg/dL Final  . Creatinine, Ser 03/28/2016 0.64  0.44 - 1.00 mg/dL Final  . Calcium 03/28/2016 9.4  8.9 - 10.3 mg/dL  Final  . Total Protein 03/28/2016 7.6  6.5 - 8.1 g/dL Final  . Albumin 03/28/2016 3.9  3.5 - 5.0 g/dL Final  . AST 03/28/2016 29  15 - 41 U/L Final  . ALT 03/28/2016 24  14 - 54 U/L Final  . Alkaline Phosphatase 03/28/2016 45  38 - 126 U/L Final  . Total Bilirubin 03/28/2016 0.6  0.3 - 1.2 mg/dL Final  . GFR calc non Af Amer 03/28/2016 >60  >60 mL/min Final  . GFR calc Af Amer 03/28/2016 >60  >60 mL/min Final   Comment: (NOTE) The eGFR has been calculated using the CKD EPI equation. This calculation has not been validated in all clinical situations. eGFR's persistently <60 mL/min signify possible Chronic Kidney Disease.   . Anion gap 03/28/2016 10  5 - 15 Final  . Lipase 03/28/2016 45  11 - 51 U/L Final  . Lactic Acid, Venous 03/28/2016 2.15* 0.5 - 1.9 mmol/L Final  .  Comment 03/28/2016 NOTIFIED PHYSICIAN   Final  . Lactic Acid, Venous 03/28/2016 0.64  0.5 - 1.9 mmol/L Final  Admission on 03/23/2016, Discharged on 03/27/2016  Component Date Value Ref Range Status  . WBC 03/23/2016 23.6* 4.0 - 10.5 K/uL Final  . RBC 03/23/2016 4.94  3.87 - 5.11 MIL/uL Final  . Hemoglobin 03/23/2016 12.0  12.0 - 15.0 g/dL Final  . HCT 03/23/2016 39.3  36.0 - 46.0 % Final  . MCV 03/23/2016 79.6  78.0 - 100.0 fL Final  . MCH 03/23/2016 24.3* 26.0 - 34.0 pg Final  . MCHC 03/23/2016 30.5  30.0 - 36.0 g/dL Final  . RDW 03/23/2016 17.2* 11.5 - 15.5 % Final  . Platelets 03/23/2016 643* 150 - 400 K/uL Final  . Neutrophils Relative % 03/23/2016 86  % Final  . Neutro Abs 03/23/2016 20.3* 1.7 - 7.7 K/uL Final  . Lymphocytes Relative 03/23/2016 4  % Final  . Lymphs Abs 03/23/2016 0.9  0.7 - 4.0 K/uL Final  . Monocytes Relative 03/23/2016 10  % Final  . Monocytes Absolute 03/23/2016 2.4* 0.1 - 1.0 K/uL Final  . Eosinophils Relative 03/23/2016 0  % Final  . Eosinophils Absolute 03/23/2016 0.0  0.0 - 0.7 K/uL Final  . Basophils Relative 03/23/2016 0  % Final  . Basophils Absolute 03/23/2016 0.0  0.0 - 0.1 K/uL  Final  . Sodium 03/23/2016 132* 135 - 145 mmol/L Final  . Potassium 03/23/2016 4.3  3.5 - 5.1 mmol/L Final  . Chloride 03/23/2016 97* 101 - 111 mmol/L Final  . CO2 03/23/2016 22  22 - 32 mmol/L Final  . Glucose, Bld 03/23/2016 153* 65 - 99 mg/dL Final  . BUN 03/23/2016 19  6 - 20 mg/dL Final  . Creatinine, Ser 03/23/2016 0.98  0.44 - 1.00 mg/dL Final  . Calcium 03/23/2016 9.6  8.9 - 10.3 mg/dL Final  . Total Protein 03/23/2016 7.9  6.5 - 8.1 g/dL Final  . Albumin 03/23/2016 4.3  3.5 - 5.0 g/dL Final  . AST 03/23/2016 43* 15 - 41 U/L Final  . ALT 03/23/2016 15  14 - 54 U/L Final  . Alkaline Phosphatase 03/23/2016 47  38 - 126 U/L Final  . Total Bilirubin 03/23/2016 0.3  0.3 - 1.2 mg/dL Final  . GFR calc non Af Amer 03/23/2016 53* >60 mL/min Final  . GFR calc Af Amer 03/23/2016 >60  >60 mL/min Final   Comment: (NOTE) The eGFR has been calculated using the CKD EPI equation. This calculation has not been validated in all clinical situations. eGFR's persistently <60 mL/min signify possible Chronic Kidney Disease.   . Anion gap 03/23/2016 13  5 - 15 Final  . Lactic Acid, Venous 03/23/2016 4.74* 0.5 - 1.9 mmol/L Final  . Comment 03/23/2016 NOTIFIED PHYSICIAN   Final  . Color, Urine 03/23/2016 YELLOW  YELLOW Final  . APPearance 03/23/2016 CLEAR  CLEAR Final  . Specific Gravity, Urine 03/23/2016 1.009  1.005 - 1.030 Final  . pH 03/23/2016 7.0  5.0 - 8.0 Final  . Glucose, UA 03/23/2016 NEGATIVE  NEGATIVE mg/dL Final  . Hgb urine dipstick 03/23/2016 NEGATIVE  NEGATIVE Final  . Bilirubin Urine 03/23/2016 NEGATIVE  NEGATIVE Final  . Ketones, ur 03/23/2016 NEGATIVE  NEGATIVE mg/dL Final  . Protein, ur 03/23/2016 NEGATIVE  NEGATIVE mg/dL Final  . Nitrite 03/23/2016 NEGATIVE  NEGATIVE Final  . Leukocytes, UA 03/23/2016 NEGATIVE  NEGATIVE Final  . FIO2 03/23/2016 21.00   Final  . pH, Ven 03/23/2016 7.451* 7.250 -  7.430 Final  . pCO2, Ven 03/23/2016 36.4* 44.0 - 60.0 mmHg Final  . pO2, Ven  03/23/2016 BELOW REPORTABLE RANGE.  32.0 - 45.0 mmHg Final   Comment: CRITICAL RESULT CALLED TO, READ BACK BY AND VERIFIED WITH: Karlyne Greenspan, MD AT 2105 ON 03/23/16 BY Coralie Common, RRT, RCP   . Bicarbonate 03/23/2016 25.2  20.0 - 28.0 mmol/L Final  . Acid-Base Excess 03/23/2016 1.6  0.0 - 2.0 mmol/L Final  . O2 Saturation 03/23/2016 39.4  % Final  . Patient temperature 03/23/2016 97.2   Final  . Collection site 03/23/2016 VEIN   Final  . Drawn by 03/23/2016 [id number]   Final  . Sample type 03/23/2016 VENOUS   Final  . Lactic Acid, Venous 03/23/2016 3.83* 0.5 - 1.9 mmol/L Final  . Comment 03/23/2016 NOTIFIED PHYSICIAN   Final  . Influenza A By PCR 03/24/2016 NEGATIVE  NEGATIVE Final  . Influenza B By PCR 03/24/2016 NEGATIVE  NEGATIVE Final   Comment: (NOTE) The Xpert Xpress Flu assay is intended as an aid in the diagnosis of  influenza and should not be used as a sole basis for treatment.  This  assay is FDA approved for nasopharyngeal swab specimens only. Nasal  washings and aspirates are unacceptable for Xpert Xpress Flu testing.   Marland Kitchen Specimen Description 03/29/2016 BLOOD RIGHT HAND   Final  . Special Requests 03/29/2016 BOTTLES DRAWN AEROBIC AND ANAEROBIC 5CC   Final  . Culture 03/29/2016    Final                   Value:NO GROWTH 5 DAYS Performed at [facility]   . Report Status 03/29/2016 03/29/2016 FINAL   Final  . Specimen Description 03/29/2016 BLOOD RIGHT ARM   Final  . Special Requests 03/29/2016 BOTTLES DRAWN AEROBIC ONLY 5CC   Final  . Culture 03/29/2016    Final                   Value:NO GROWTH 5 DAYS Performed at [facility]   . Report Status 03/29/2016 03/29/2016 FINAL   Final  . Strep Pneumo Urinary Antigen 03/24/2016 NEGATIVE  NEGATIVE Final   Comment:        Infection due to S. pneumoniae cannot be absolutely ruled out since the antigen present may be below the detection limit of the test. Performed at [facility]   . Lactic Acid, Venous  03/24/2016 1.5  0.5 - 1.9 mmol/L Final  . Lactic Acid, Venous 03/24/2016 1.2  0.5 - 1.9 mmol/L Final  . Procalcitonin 03/24/2016 0.43  ng/mL Final   Comment:        Interpretation: PCT (Procalcitonin) <= 0.5 ng/mL: Systemic infection (sepsis) is not likely. Local bacterial infection is possible. (NOTE)         ICU PCT Algorithm               Non ICU PCT Algorithm    ----------------------------     ------------------------------         PCT < 0.25 ng/mL                 PCT < 0.1 ng/mL     Stopping of antibiotics            Stopping of antibiotics       strongly encouraged.               strongly encouraged.    ----------------------------     ------------------------------  PCT level decrease by               PCT < 0.25 ng/mL       >= 80% from peak PCT       OR PCT 0.25 - 0.5 ng/mL          Stopping of antibiotics                                             encouraged.     Stopping of antibiotics           encouraged.    ----------------------------     ------------------------------       PCT level decrease by              PCT >= 0.25 ng/mL       < 80% from peak PCT        AND PCT >= 0.5 ng/mL            Continuin                          g antibiotics                                              encouraged.       Continuing antibiotics            encouraged.    ----------------------------     ------------------------------     PCT level increase compared          PCT > 0.5 ng/mL         with peak PCT AND          PCT >= 0.5 ng/mL             Escalation of antibiotics                                          strongly encouraged.      Escalation of antibiotics        strongly encouraged.   . Prothrombin Time 03/24/2016 14.6  11.4 - 15.2 seconds Final  . INR 03/24/2016 1.14   Final  . aPTT 03/24/2016 31  24 - 36 seconds Final  . Hgb A1c MFr Bld 03/25/2016 6.0* 4.8 - 5.6 % Final   Comment: (NOTE)         Pre-diabetes: 5.7 - 6.4         Diabetes: >6.4         Glycemic  control for adults with diabetes: <7.0   . Mean Plasma Glucose 03/25/2016 126  mg/dL Final   Comment: (NOTE) Performed At: [facility] [place], [place] [id number] Lindon Romp MD ER:[id number]   . Magnesium 03/24/2016 1.8  1.7 - 2.4 mg/dL Final  . Phosphorus 03/24/2016 2.6  2.5 - 4.6 mg/dL Final  . TSH 03/24/2016 1.380  0.350 - 4.500 uIU/mL Final  . Sodium 03/24/2016 133* 135 - 145 mmol/L Final  . Potassium 03/24/2016 3.5  3.5 - 5.1 mmol/L Final   Comment: DELTA CHECK NOTED REPEATED TO VERIFY   . Chloride  03/24/2016 103  101 - 111 mmol/L Final  . CO2 03/24/2016 23  22 - 32 mmol/L Final  . Glucose, Bld 03/24/2016 131* 65 - 99 mg/dL Final  . BUN 03/24/2016 12  6 - 20 mg/dL Final  . Creatinine, Ser 03/24/2016 0.64  0.44 - 1.00 mg/dL Final  . Calcium 03/24/2016 8.2* 8.9 - 10.3 mg/dL Final  . Total Protein 03/24/2016 6.3* 6.5 - 8.1 g/dL Final  . Albumin 03/24/2016 3.4* 3.5 - 5.0 g/dL Final  . AST 03/24/2016 51* 15 - 41 U/L Final  . ALT 03/24/2016 17  14 - 54 U/L Final  . Alkaline Phosphatase 03/24/2016 36* 38 - 126 U/L Final  . Total Bilirubin 03/24/2016 0.4  0.3 - 1.2 mg/dL Final  . GFR calc non Af Amer 03/24/2016 >60  >60 mL/min Final  . GFR calc Af Amer 03/24/2016 >60  >60 mL/min Final   Comment: (NOTE) The eGFR has been calculated using the CKD EPI equation. This calculation has not been validated in all clinical situations. eGFR's persistently <60 mL/min signify possible Chronic Kidney Disease.   . Anion gap 03/24/2016 7  5 - 15 Final  . WBC 03/24/2016 11.4* 4.0 - 10.5 K/uL Final  . RBC 03/24/2016 3.84* 3.87 - 5.11 MIL/uL Final  . Hemoglobin 03/24/2016 9.6* 12.0 - 15.0 g/dL Final  . HCT 03/24/2016 30.4* 36.0 - 46.0 % Final  . MCV 03/24/2016 79.2  78.0 - 100.0 fL Final  . MCH 03/24/2016 25.0* 26.0 - 34.0 pg Final  . MCHC 03/24/2016 31.6  30.0 - 36.0 g/dL Final  . RDW 03/24/2016 17.5* 11.5 - 15.5 % Final  . Platelets 03/24/2016 521* 150 - 400  K/uL Final  . Troponin I 03/24/2016 0.04* <0.03 ng/mL Final   Comment: CRITICAL RESULT CALLED TO, READ BACK BY AND VERIFIED WITHDiana Eves RN [id number] 03/24/16 A NAVARRO   . Troponin I 03/24/2016 0.03* <0.03 ng/mL Final  . Troponin I 03/24/2016 <0.03  <0.03 ng/mL Final  . Glucose-Capillary 03/24/2016 139* 65 - 99 mg/dL Final  . Comment 1 03/24/2016 Notify RN   Final  . Comment 2 03/24/2016 Document in Chart   Final  . MRSA by PCR 03/24/2016 NEGATIVE  NEGATIVE Final   Comment:        The GeneXpert MRSA Assay (FDA approved for NASAL specimens only), is one component of a comprehensive MRSA colonization surveillance program. It is not intended to diagnose MRSA infection nor to guide or monitor treatment for MRSA infections.   . Glucose-Capillary 03/24/2016 141* 65 - 99 mg/dL Final  . Comment 1 03/24/2016 Notify RN   Final  . Glucose-Capillary 03/24/2016 141* 65 - 99 mg/dL Final  . Comment 1 03/24/2016 Notify RN   Final  . Comment 2 03/24/2016 Document in Chart   Final  . Glucose-Capillary 03/24/2016 104* 65 - 99 mg/dL Final  . Glucose-Capillary 03/24/2016 102* 65 - 99 mg/dL Final  . Glucose-Capillary 03/25/2016 113* 65 - 99 mg/dL Final  . Comment 1 03/25/2016 Notify RN   Final  . Glucose-Capillary 03/25/2016 101* 65 - 99 mg/dL Final  . Comment 1 03/25/2016 Notify RN   Final  . Glucose-Capillary 03/25/2016 107* 65 - 99 mg/dL Final  . Comment 1 03/25/2016 Notify RN   Final  . Comment 2 03/25/2016 Document in Chart   Final  . Glucose-Capillary 03/25/2016 115* 65 - 99 mg/dL Final  . Comment 1 03/25/2016 Notify RN   Final  . Comment 2 03/25/2016 Document in Chart  Final  . Glucose-Capillary 03/25/2016 104* 65 - 99 mg/dL Final  . Comment 1 03/25/2016 Notify RN   Final  . Comment 2 03/25/2016 Document in Chart   Final  . Glucose-Capillary 03/25/2016 97  65 - 99 mg/dL Final  . Comment 1 03/25/2016 Notify RN   Final  . Glucose-Capillary 03/25/2016 117* 65 - 99 mg/dL Final  . Comment 1  03/25/2016 Notify RN   Final  . Glucose-Capillary 03/26/2016 105* 65 - 99 mg/dL Final  . Comment 1 03/26/2016 Notify RN   Final  . Glucose-Capillary 03/26/2016 127* 65 - 99 mg/dL Final  . Glucose-Capillary 03/26/2016 131* 65 - 99 mg/dL Final  . Glucose-Capillary 03/26/2016 128* 65 - 99 mg/dL Final  . Glucose-Capillary 03/26/2016 114* 65 - 99 mg/dL Final  . Comment 1 03/26/2016 Notify RN   Final  . Glucose-Capillary 03/27/2016 139* 65 - 99 mg/dL Final  . Comment 1 03/27/2016 Notify RN   Final  . Glucose-Capillary 03/27/2016 109* 65 - 99 mg/dL Final  . Comment 1 03/27/2016 Notify RN   Final  . Glucose-Capillary 03/27/2016 131* 65 - 99 mg/dL Final  . Glucose-Capillary 03/27/2016 95  65 - 99 mg/dL Final    Dg Chest 2 View  Result Date: 04/25/2016 CLINICAL DATA:  Acute hypoxic respiratory failure. EXAM: CHEST  2 VIEW COMPARISON:  04/20/2016 and 03/28/2016 FINDINGS: Again noted are right perihilar densities which have not significantly changed from the previous examination. There is chronic elevation of the right hemidiaphragm. There continues to be densities at left lung base suggestive for pleural fluid and probably airspace disease. Atherosclerotic calcifications in the aortic arch. Heart size is grossly stable. Again noted is surgical hardware in the lumbar spine. IMPRESSION: Persistent densities in the lower chest bilaterally. Concern for airspace disease and pleural fluid, particularly on the left side. Findings could represent pneumonia. Recommend continued follow up to ensure resolution or further characterization with CT. Electronically Signed   By: Markus Daft M.D.   On: 04/25/2016 15:54   Dg Chest 2 View  Result Date: 04/20/2016 CLINICAL DATA:  Shortness of breath. EXAM: CHEST  2 VIEW COMPARISON:  Two-view chest x-ray 03/28/2016. FINDINGS: The heart size is normal. Lung volumes are low. New by lateral lower lobe airspace disease is present, left greater than right. Bilateral pleural effusions  are present. The upper lung fields are clear. The visualized soft tissues and bony thorax are unremarkable. IMPRESSION: 1. Bilateral lower lobe pneumonia. 2. Bilateral pleural effusions. Electronically Signed   By: San Morelle M.D.   On: 04/20/2016 14:38     Assessment/Plan   ICD-9-CM ICD-10-CM   1. Nausea without vomiting - related to oral augmentin 787.02 R11.0   2. Aspiration pneumonia of both lower lobes, unspecified aspiration pneumonia type ([place]) 507.0 J69.0    s/p influenza infection  3. Anemia, unspecified type 285.9 D64.9   4. Physical deconditioning 799.3 R53.81   5. Chronic pain syndrome 338.4 G89.4   6. Primary osteoarthritis involving multiple joints 715.09 M15.0   7. Essential hypertension 401.9 I10   8. Protein-calorie malnutrition, severe ([place]) 262 E43   9. Chronic respiratory failure with hypoxia ([facility]) 518.83 J96.11    799.02     on 5 L/min [place] O2    Check CMP to follow lytes and LFTs and CBC for anemia  Augmentin 844m po q12h x 4 doses. Take phenergan 23mpo q12h WITH Augmentin  Cont current meds as ordered  PT/OT/ST as ordered  Cont nutritional supplements as ordered  GOAL: short term rehab and d/c home when medically appropriate. Communicated with pt and nursing.  Will follow  Christorpher Hisaw S. Perlie Gold  [facility] [address] [place], [place] [address] [phone] Cell (Monday-Friday 8 AM - 5 PM) [phone] After 5 PM and follow prompts

## 2016-05-17 ENCOUNTER — Encounter: Payer: Self-pay | Admitting: Adult Health

## 2016-05-17 NOTE — Progress Notes (Signed)
Location:   Psychiatrist of Service:   snf   CODE STATUS: full code   Allergies  Allergen Reactions  . Ciprofloxacin Rash    burning    Chief Complaint  Patient presents with  . Hospitalization Follow-up     HPI:  She has been hospitalized for acute on chronic hypoxemic respiratory failure from influenza and suspected aspiration pneumonia. She will need to complete a 7 day coarse of augmentin. She is here for short term rehab with her goal to return back home.   Past Medical History:  Diagnosis Date  . Anemia 10-05-12   hgb-9.0 on 09-14-12  . Arm vein blood clot 10-05-12   Rt. arm '12  . Arthritis 10-05-12   degenerative joint disease,scoliosis spine  . Aspiration pneumonia ([place])   . Dysrhythmia 10-05-12   tachycardia -tx. Lopressor  . GERD (gastroesophageal reflux disease)   . H/O esophagitis   . H/O hiatal hernia   . Tachycardia   . Transfusion history 10-05-12   4 units blood '12    Past Surgical History:  Procedure Laterality Date  . ABDOMINAL HYSTERECTOMY    . APPENDECTOMY     child  . BACK SURGERY  10-05-12   x5-last fusion with plates  . BREAST ENHANCEMENT SURGERY    . CATARACT EXTRACTION, BILATERAL    . COLONOSCOPY WITH PROPOFOL N/A 10/23/2012   Procedure: COLONOSCOPY WITH PROPOFOL;  Surgeon: Lear Ng, MD;  Location: [facility];  Service: Endoscopy;  Laterality: N/A;  . HOT HEMOSTASIS N/A 10/23/2012   Procedure: HOT HEMOSTASIS (ARGON PLASMA COAGULATION/BICAP);  Surgeon: Lear Ng, MD;  Location: [facility];  Service: Endoscopy;  Laterality: N/A;    Social History   Social History  . Marital status: Widowed    Spouse name: N/A  . Number of children: N/A  . Years of education: N/A   Occupational History  . Not on file.   Social History Main Topics  . Smoking status: Never Smoker  . Smokeless tobacco: Never Used  . Alcohol use No  . Drug use: No  . Sexual activity: Not Currently   Other Topics Concern  . Not  on file   Social History Narrative  . No narrative on file   Family History  Problem Relation Age of Onset  . Cancer Mother   . Cancer Father   . Diabetes Sister   . Stroke Neg Hx   . CAD Neg Hx       VITAL SIGNS BP (!) 146/74   Pulse 95   Temp (!) 96.7 F (35.9 C)   Resp 18   Ht 5\' 4"  (1.626 m)   Wt 135 lb 6.4 oz (61.4 kg)   SpO2 94%   BMI 23.24 kg/m   Patient's Medications  New Prescriptions   No medications on file  Previous Medications   ACETAMINOPHEN (TYLENOL) 325 MG TABLET    Take 650 mg by mouth every 4 (four) hours as needed for fever.   ASPIRIN 81 MG TABLET    Take 81 mg by mouth at bedtime.    augmentin 875 mg  Take twice daily through 05-03-16   IPRATROPIUM-ALBUTEROL (DUONEB) 0.5-2.5 (3) MG/3ML SOLN    Take 3 mLs by nebulization every 4 (four) hours as needed (shortness of breath/wheezing).   METOPROLOL (LOPRESSOR) 50 MG TABLET    Take 50 mg by mouth 2 (two) times daily.   MULTIPLE VITAMIN (MULTIVITAMIN WITH MINERALS) TABS TABLET  Take 1 tablet by mouth daily.   OXYCODONE (OXYCONTIN) 40 MG 12 HR TABLET    Take one tablet by mouth every 8 hours for pain   PANTOPRAZOLE (PROTONIX) 40 MG TABLET    Take 40 mg by mouth 2 (two) times daily.   POLYETHYLENE GLYCOL (MIRALAX / GLYCOLAX) PACKET    Take 17 g by mouth 2 (two) times daily.   PROMETHAZINE (PHENERGAN) 25 MG TABLET    Take 25 mg by mouth every 4 (four) hours as needed for nausea or vomiting.   SACCHAROMYCES BOULARDII (FLORASTOR) 250 MG CAPSULE    Take 250 mg by mouth daily.  Modified Medications   No medications on file  Discontinued Medications   No medications on file     SIGNIFICANT DIAGNOSTIC EXAMS  04-09-16; chest x-ray: slight right basilar atelectasis but without focal pneumonia or edema  04-11-16; chest x-ray: modest left basilar infiltrate  04-20-16: chest x-ray; 1. Bilateral lower lobe pneumonia. 2. Bilateral pleural effusions.  04-25-16: chest x-ray: Persistent densities in the lower chest  bilaterally. Concern for airspace disease and pleural fluid, particularly on the left side. Findings could represent pneumonia. Recommend continued follow up to ensure resolution or further characterization with CT.   04-25-16: 2-d echo: - Left ventricle: The cavity size was normal. Wall thickness was normal. Systolic function was normal. The estimated ejection fraction was in the range of 55% to 60%. Doppler parameters are consistent with abnormal left ventricular relaxation (grade 1 diastolic dysfunction). - Aortic valve: There was trivial regurgitation. - Mitral valve: Calcified annulus. Mildly thickened leaflets . - Pulmonary arteries: PA peak pressure: 43 mm Hg (S).  LABS REVIEWED:   04-04-16; wbc 7.7; hgb 11.4; hct 38.7; mcv 84.3; plt 538; glucose 101; bun 14.5; creat 0.64; k+ 4.9; na++ 137; ca 9.8 04-20-16: wbc 19.6; hgb 11.4; hct 36.6; mcv 78.4; plt 755; glucose 142; bun 13; creat 0.70; k+ 5.2; na++ 134; liver normal albumin 2.7; blood culture: no growth  HIV; nr;  flu +A 04-22-16: wbc 11.3; hgb 9.0; hct 28.6; mcv 78.1; plt 679; glucose 118; bun 9; creat 0.64; k+ 3.6; na++ 136 04-25-16: wbc 10.1; hgb 9.3; hct 31.3; mcv 80.5 ;plt 669; glucose 100; bun 5; creat 0.52; k+ 3.4; na++ 135; liver normal albumin 2.2    Review of Systems  Constitutional: Negative for malaise/fatigue.  Respiratory: Negative for cough and shortness of breath.   Cardiovascular: Negative for chest pain, palpitations and leg swelling.  Gastrointestinal: Negative for abdominal pain, constipation and heartburn.  Musculoskeletal: Negative for back pain, joint pain and myalgias.  Skin: Negative.   Neurological: Negative for dizziness.  Psychiatric/Behavioral: The patient is not nervous/anxious.      Physical Exam  Constitutional: She is oriented to person, place, and time. No distress.  Thin   Eyes: Conjunctivae are normal.  Neck: Neck supple. No JVD present. No thyromegaly present.  Cardiovascular: Normal rate,  regular rhythm and intact distal pulses.   Respiratory: Effort normal. No respiratory distress. She has no wheezes.  Has few scattered rhonchi present   GI: Soft. Bowel sounds are normal. She exhibits no distension. There is no tenderness.  Musculoskeletal: She exhibits edema.  Able to move all extremities  Has small joint deformities  Has lower trace lower extremity edema   Lymphadenopathy:    She has no cervical adenopathy.  Neurological: She is alert and oriented to person, place, and time.  Skin: Skin is warm and dry. She is not diaphoretic.  Psychiatric: She has a  normal mood and affect.     ASSESSMENT/ PLAN:  1. Hypertension: will continue lopressor 50 mg twice daily asa 81 mg daily   2. Chronic respiratory failure with hypoxia: will continue duoneb every 4 hours as needed is 02 dependent  3. Osteoarthritis: has chronic pain will continue oxycontin 40 mg every 8 hours will monitor  4. [place]: hgb 9.3 will monitor  5. Gerd: will continue protonix 40 mg twice daily   6. Constipation: will continue miralax twice daily   7. Pneumonia: will complete augmentin and will monitor   8. Protein calorie malnutrition: albumin 2.2 will continue supplements per facility protocol     Time spent with patient 50  minutes >50% time spent counseling; reviewing medical record; tests; labs; and developing future plan of care   MD is aware of resident's narcotic use and is in agreement with current plan of care. We will attempt to wean resident as apropriate   Ok Edwards NP [facility]  Contact [phone] Monday through Friday 8am- 5pm  After hours call [phone]

## 2016-05-26 ENCOUNTER — Non-Acute Institutional Stay (SKILLED_NURSING_FACILITY): Payer: Medicare Other | Admitting: Adult Health

## 2016-05-26 DIAGNOSIS — L89110 Pressure ulcer of right upper back, unstageable: Secondary | ICD-10-CM

## 2016-05-26 DIAGNOSIS — L03312 Cellulitis of back [any part except buttock]: Secondary | ICD-10-CM

## 2016-05-26 DIAGNOSIS — L89120 Pressure ulcer of left upper back, unstageable: Secondary | ICD-10-CM | POA: Diagnosis not present

## 2016-06-02 ENCOUNTER — Non-Acute Institutional Stay (SKILLED_NURSING_FACILITY): Payer: Medicare Other | Admitting: Adult Health

## 2016-06-02 DIAGNOSIS — G894 Chronic pain syndrome: Secondary | ICD-10-CM

## 2016-06-02 DIAGNOSIS — L89113 Pressure ulcer of right upper back, stage 3: Secondary | ICD-10-CM

## 2016-06-02 DIAGNOSIS — M159 Polyosteoarthritis, unspecified: Secondary | ICD-10-CM

## 2016-06-02 DIAGNOSIS — L03312 Cellulitis of back [any part except buttock]: Secondary | ICD-10-CM

## 2016-06-02 DIAGNOSIS — D509 Iron deficiency anemia, unspecified: Secondary | ICD-10-CM

## 2016-06-02 DIAGNOSIS — M150 Primary generalized (osteo)arthritis: Secondary | ICD-10-CM | POA: Diagnosis not present

## 2016-06-02 DIAGNOSIS — J9611 Chronic respiratory failure with hypoxia: Secondary | ICD-10-CM | POA: Diagnosis not present

## 2016-06-02 DIAGNOSIS — I10 Essential (primary) hypertension: Secondary | ICD-10-CM

## 2016-06-02 DIAGNOSIS — E43 Unspecified severe protein-calorie malnutrition: Secondary | ICD-10-CM

## 2016-06-09 ENCOUNTER — Encounter: Payer: Self-pay | Admitting: Internal Medicine

## 2016-06-09 ENCOUNTER — Non-Acute Institutional Stay (SKILLED_NURSING_FACILITY): Payer: Medicare Other | Admitting: Internal Medicine

## 2016-06-09 DIAGNOSIS — I10 Essential (primary) hypertension: Secondary | ICD-10-CM

## 2016-06-09 DIAGNOSIS — D649 Anemia, unspecified: Secondary | ICD-10-CM

## 2016-06-09 DIAGNOSIS — J9611 Chronic respiratory failure with hypoxia: Secondary | ICD-10-CM

## 2016-06-09 DIAGNOSIS — L89113 Pressure ulcer of right upper back, stage 3: Secondary | ICD-10-CM | POA: Diagnosis not present

## 2016-06-09 NOTE — Progress Notes (Signed)
Location:   [place] Room Number: 151/C Place of Service:  SNF [phone])  Provider: Granville Lewis  PCP: [name], MD Patient Care Team: L.[name], MD as PCP - General  Extended Emergency Contact Information Primary Emergency Contact: Tonia G[place]t of [place] Phone: [phone] Relation: Other  Code Status: Full Code Goals of care:  Advanced Directive information Advanced Directives 06/09/2016  Does Patient Have a Medical Advance Directive? Yes  Type of Advance Directive (No Data)  Does patient want to make changes to medical advance directive? No - Patient declined  Copy of [place] in Chart? -  Would patient like information on creating a medical advance directive? -  Pre-existing out of facility DNR order (yellow form or pink MOST form) -     Allergies  Allergen Reactions  . Ciprofloxacin Rash    burning    Chief Complaint  Patient presents with  . Discharge Note    HPI:  83 y.o. female  seen today for discharge from facility on Friday, February 23  She was here for short-term rehabilitation with a history of 2 ulcerati initially she is now on daptomycin ons on her back which she was treated with doxycycline and now is receiving daptomycin --she has been followed by the wound care nurse as well as wound care service in the facility.  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  She also has a history of protein calorie malnutrition which will have to be watched as well and appears her albumin was 2.2 on lab done in January.  She also has a history of toxic respiratory failure and influenza she's completed treatment for this she does have duo nebs ordered.  Also a history of iron deficiency anemia. Last hemoglobin was 9.3 on December lab will try to update this  as well.  Currently she has no acute complaints although there is concern about her back wounds and make sure there is appropriate follow-up for this.  She also will need CNA to help with activities of daily living PT and OT to help with strengthening as well as the Education officer, museum        Past Medical History:  Diagnosis Date  . Anemia 10-05-12   hgb-9.0 on 09-14-12  . Arm vein blood clot 10-05-12   Rt. arm '12  . Arthritis 10-05-12   degenerative joint disease,scoliosis spine  . Aspiration pneumonia ([place])   . Dysrhythmia 10-05-12   tachycardia -tx. Lopressor  . GERD (gastroesophageal reflux disease)   . H/O esophagitis   . H/O hiatal hernia   . Tachycardia   . Transfusion history 10-05-12   4 units blood '12    Past Surgical History:  Procedure Laterality Date  . ABDOMINAL HYSTERECTOMY    . APPENDECTOMY     child  . BACK SURGERY  10-05-12   x5-last fusion with plates  . BREAST ENHANCEMENT SURGERY    . CATARACT EXTRACTION, BILATERAL    . COLONOSCOPY WITH PROPOFOL N/A 10/23/2012   Procedure: COLONOSCOPY WITH PROPOFOL;  Surgeon: Lear Ng, MD;  Location: [facility];  Service: Endoscopy;  Laterality: N/A;  . HOT HEMOSTASIS N/A 10/23/2012   Procedure: HOT HEMOSTASIS (ARGON PLASMA COAGULATION/BICAP);  Surgeon: Lear Ng, MD;  Location: [facility];  Service: Endoscopy;  Laterality: N/A;  reports that she has never smoked. She has never used smokeless tobacco. She reports that she does not drink alcohol or use drugs. Social History   Social History  . Marital status: Widowed    Spouse name: N/A  . Number of children: N/A  . Years of education: N/A   Occupational History  . Not on file.   Social History Main Topics  . Smoking status: Never Smoker  . Smokeless tobacco: Never Used  . Alcohol use No  . Drug use: No  . Sexual activity: Not Currently   Other Topics Concern  . Not on file   Social History Narrative  . No narrative on file    Functional Status Survey:    Allergies  Allergen Reactions  . Ciprofloxacin Rash    burning    Pertinent  Health Maintenance Due  Topic Date Due  . DEXA SCAN  07/08/1999  . INFLUENZA VACCINE  03/18/2017 (Originally 11/17/2015)  . PNA vac Low Risk Adult (1 of 2 - PCV13) 03/18/2017 (Originally 07/08/1999)    Medications: Allergies as of 06/09/2016      Reactions   Ciprofloxacin Rash   burning      Medication List       Accurate as of 06/09/16 10:21 AM. Always use your most recent med list.          acetaminophen 325 MG tablet Commonly known as:  TYLENOL Take 650 mg by mouth every 4 (four) hours as needed for fever.   aspirin 81 MG tablet Take 81 mg by mouth at bedtime.   CUBICIN 500 MG injection Generic drug:  DAPTOmycin Use 250 mg intravenously one time a day for infection for 14 days [place] Cubicin 250 mg IV every day for 14 days for wounds.   ipratropium-albuterol 0.5-2.5 (3) MG/3ML Soln Commonly known as:  DUONEB Take 3 mLs by nebulization every 4 (four) hours as needed (shortness of breath/wheezing).   metoprolol 50 MG tablet Commonly known as:  LOPRESSOR Take 50 mg by mouth 2 (two) times daily.   multivitamin with minerals Tabs tablet Take 1 tablet by mouth daily.   oxyCODONE 40 mg 12 hr tablet Commonly known as:  OXYCONTIN Take one tablet by mouth every 8 hours for pain   pantoprazole 40 MG tablet Commonly known as:  PROTONIX Take 40 mg by mouth 2 (two) times daily.   polyethylene glycol packet Commonly known as:  MIRALAX / GLYCOLAX Take 17 g by mouth 2 (two) times daily.   promethazine 25 MG tablet Commonly known as:  PHENERGAN Take 25 mg by mouth every 4 (four) hours as needed for nausea or vomiting.   saccharomyces boulardii 250 MG capsule Commonly known as:  FLORASTOR Take 250 mg by mouth daily.   SANTYL ointment Generic drug:  collagenase Apply to back topically every day shift for wound care clean area pat dry apply Santyl to wound  bed cover with dry dressing daily       Review of Systems  Constitutional: Negative for malaise/fatigue. Still feels weak  Respiratory: Negative for cough and shortness of breath.   Cardiovascular: Negative for chest pain, palpitations and leg swelling.  Gastrointestinal: Negative for abdominal pain, constipation and heartburn.  Musculoskeletal: Negative for back pain, joint pain and myalgias.  Skin: has sores on back   Neurological: Negative for dizziness. Or syncope but does feel weak at times Psychiatric/Behavioral: The patient is not nervous/anxious.     Vitals:   06/09/16 1013  BP: (!) 102/49  Pulse: 91  Resp: 18  Temp: 97.7 F (36.5 C)  TempSrc: Oral  SpO2: 97%  Weight: 128 lb 9.6 oz (58.3 kg)  Height: 5\' 4"  (1.626 m)   Body mass index is 22.07 kg/m. Physical Exam Constitutional: She is oriented to person, place, and time. No distress.  Thin   frail-appearing Eyes: Conjunctivae are normal.  Neck: Neck supple. No JVD present. No thyromegaly present.  Cardiovascular: Normal rate, regular rhythm and intact distal pulses.   Respiratory: Effort normal. No respiratory distress. She has no wheezes.   shallow air entry GI: Soft. Bowel sounds are normal. She exhibits no distension. There is no tenderness.  Musculoskeletal: She exhibits minimal edema  Able to move all extremities  Has small joint deformities  Has lower trace lower extremity edema   Lymphadenopathy:    She has no cervical adenopathy.  Neurological: She is alert and oriented to person, place, and time.  Skin: Skin is warm and dry. She is not diaphoretic.  Psychiatric: She has a normal mood and affect.   Skin she has 2 significant back wounds one appears to be a stage III the right upper back the other a stage IV some moderately serous discharge it is status post removal some devitalized tissue    Labs reviewed: Basic Metabolic Panel:  Recent Labs  03/24/16 0506  04/21/16 0557 04/22/16 0622  04/25/16 0547  NA 133*  < > 134* 136 135  K 3.5  < > 3.9 3.6 3.4*  CL 103  < > 102 106 103  CO2 23  < > 22 22 21*  GLUCOSE 131*  < > 104* 118* 100*  BUN 12  < > 11 9 5*  CREATININE 0.64  < > 0.60 0.64 0.52  CALCIUM 8.2*  < > 8.3* 8.3* 8.2*  MG 1.8  --   --   --   --   PHOS 2.6  --   --   --   --   < > = values in this interval not displayed. Liver Function Tests:  Recent Labs  04/20/16 1453 04/21/16 0557 04/25/16 0547  AST 47* 28 54*  ALT 36 27 30  ALKPHOS 97 79 281*  BILITOT 0.7 0.5 0.9  PROT 7.8 6.2* 5.8*  ALBUMIN 2.7* 2.3* 2.2*    Recent Labs  03/28/16 0626  LIPASE 45   No results for input(s): AMMONIA in the last 8760 hours. CBC:  Recent Labs  03/23/16 2043  03/28/16 0552  04/21/16 0557 04/22/16 0622 04/25/16 0547  WBC 23.6*  < > 11.5*  < > 12.1* 11.3* 10.1  NEUTROABS 20.3*  --  8.9*  --   --   --   --   HGB 12.0  < > 11.4*  < > 9.3* 9.0* 9.3*  HCT 39.3  < > 37.0  < > 30.9* 28.6* 31.3*  MCV 79.6  < > 79.6  < > 77.1* 78.1 80.5  PLT 643*  < > 354  < > 666* 679* 669*  < > = values in this interval not displayed. Cardiac Enzymes:  Recent Labs  03/24/16 1132 03/24/16 1653 04/20/16 1453  TROPONINI 0.03* <0.03 0.06*   BNP: Invalid input(s): POCBNP CBG:  Recent Labs  03/27/16 0450 03/27/16 0730 03/27/16 1248  GLUCAP 109* 131* 95    Procedures and Imaging Studies During Stay: No results found.  Assessment/Plan:    1 history of back wounds as noted above-this will need again the wound care follow-up by home  health upon discharge continues on daptomycin she does have a PICC line in place again this will have to be watched carefully she will need PT and OT for strengthening as well as Education officer, museum and CNA help with her activities of daily living she is OxyContin when necessary for pain.  Currently she is receiving topical treatments for this including calcium alginate to the stage III wound and Betadine wet to moist dressings for the stage IV  wound  #2 history of hypoxic respiratory failure she's completed antibiotics she continues with duo nebs as needed this appears to be stable.  #3 history of iron deficiency anemia --hemoglobin in January was 9.3 this will need to be updated with follow-up by primary care provider.  #4 history of protein calorie malnutrition current weight is 128 pounds-most recent albumin was 2.2-this will need to be followed by primary care provider when she is discharged continues to be quite frail.  #5 history of GERD she is on Protonix this appears to be relatively stable.  #6 history of hypertension she continues on Lopressor twice a day this appears to be relatively stable as well.  Again she will need extensive home health support socially for management of her wounds as well as administration of the antibiotic-also will need PT and OT for strengthening as well as CNA to help with ADLs and a social worker to help with community issues as well.  [id number] note greater than 35 minutes spent on this discharge summary-greater than 50% of time spent coordinating plan of care for numerous diagnoses

## 2016-06-10 LAB — CBC AND DIFFERENTIAL
HCT: 33 % — AB (ref 36–46)
HEMOGLOBIN: 10.1 g/dL — AB (ref 12.0–16.0)
NEUTROS ABS: 5 /uL
PLATELETS: 516 10*3/uL — AB (ref 150–399)
WBC: 7.2 10^3/mL

## 2016-06-10 LAB — BASIC METABOLIC PANEL
BUN: 10 mg/dL (ref 4–21)
CREATININE: 0.6 mg/dL (ref 0.5–1.1)
Glucose: 125 mg/dL
Potassium: 4.7 mmol/L (ref 3.4–5.3)
SODIUM: 137 mmol/L (ref 137–147)

## 2016-06-27 DIAGNOSIS — L89109 Pressure ulcer of unspecified part of back, unspecified stage: Secondary | ICD-10-CM | POA: Insufficient documentation

## 2016-06-27 DIAGNOSIS — L03312 Cellulitis of back [any part except buttock]: Secondary | ICD-10-CM | POA: Insufficient documentation

## 2016-06-27 DIAGNOSIS — L89119 Pressure ulcer of right upper back, unspecified stage: Secondary | ICD-10-CM | POA: Insufficient documentation

## 2016-06-27 NOTE — Progress Notes (Signed)
Location:   [facility] of Service:  SNF (31)   CODE STATUS: full code   Allergies  Allergen Reactions  . Ciprofloxacin Rash    burning    Chief Complaint  Patient presents with  . Acute Visit    wound management     HPI:  I have been asked by the wound nurse to examine her unstaged wounds on her back. She is have pain present. The areas are red hot inflamed. There is concern that she is developing cellulitis. There are no reports of fever present.     Past Medical History:  Diagnosis Date  . Anemia 10-05-12   hgb-9.0 on 09-14-12  . Arm vein blood clot 10-05-12   Rt. arm '12  . Arthritis 10-05-12   degenerative joint disease,scoliosis spine  . Aspiration pneumonia ([place])   . Dysrhythmia 10-05-12   tachycardia -tx. Lopressor  . GERD (gastroesophageal reflux disease)   . H/O esophagitis   . H/O hiatal hernia   . Tachycardia   . Transfusion history 10-05-12   4 units blood '12    Past Surgical History:  Procedure Laterality Date  . ABDOMINAL HYSTERECTOMY    . APPENDECTOMY     child  . BACK SURGERY  10-05-12   x5-last fusion with plates  . BREAST ENHANCEMENT SURGERY    . CATARACT EXTRACTION, BILATERAL    . COLONOSCOPY WITH PROPOFOL N/A 10/23/2012   Procedure: COLONOSCOPY WITH PROPOFOL;  Surgeon: Lear Ng, MD;  Location: [facility];  Service: Endoscopy;  Laterality: N/A;  . HOT HEMOSTASIS N/A 10/23/2012   Procedure: HOT HEMOSTASIS (ARGON PLASMA COAGULATION/BICAP);  Surgeon: Lear Ng, MD;  Location: [facility];  Service: Endoscopy;  Laterality: N/A;    Social History   Social History  . Marital status: Widowed    Spouse name: N/A  . Number of children: N/A  . Years of education: N/A   Occupational History  . Not on file.   Social History Main Topics  . Smoking status: Never Smoker  . Smokeless tobacco: Never Used  . Alcohol use No  . Drug use: No  . Sexual activity: Not Currently   Other Topics Concern  . Not on file     Social History Narrative  . No narrative on file   Family History  Problem Relation Age of Onset  . Cancer Mother   . Cancer Father   . Diabetes Sister   . Stroke Neg Hx   . CAD Neg Hx       VITAL SIGNS BP (!) 111/59   Pulse 100   Temp 98.9 F (37.2 C)   Resp 18   Ht 5\' 4"  (1.626 m)   Wt 128 lb 9.6 oz (58.3 kg)   SpO2 96%   BMI 22.07 kg/m   Patient's Medications  New Prescriptions   No medications on file  Previous Medications   ACETAMINOPHEN (TYLENOL) 325 MG TABLET    Take 650 mg by mouth every 4 (four) hours as needed for fever.   ASPIRIN 81 MG TABLET    Take 81 mg by mouth at bedtime.    COLLAGENASE (SANTYL) OINTMENT    Apply to back topically every day shift for wound care clean area pat dry apply Santyl to wound bed cover with dry dressing daily   IPRATROPIUM-ALBUTEROL (DUONEB) 0.5-2.5 (3) MG/3ML SOLN    Take 3 mLs by nebulization every 4 (four) hours as needed (shortness of breath/wheezing).   METOPROLOL (  LOPRESSOR) 50 MG TABLET    Take 50 mg by mouth 2 (two) times daily.   MULTIPLE VITAMIN (MULTIVITAMIN WITH MINERALS) TABS TABLET    Take 1 tablet by mouth daily.   OXYCODONE (OXYCONTIN) 40 MG 12 HR TABLET    Take one tablet by mouth every 8 hours for pain   PANTOPRAZOLE (PROTONIX) 40 MG TABLET    Take 40 mg by mouth 2 (two) times daily.   POLYETHYLENE GLYCOL (MIRALAX / GLYCOLAX) PACKET    Take 17 g by mouth 2 (two) times daily.   PROMETHAZINE (PHENERGAN) 25 MG TABLET    Take 25 mg by mouth every 4 (four) hours as needed for nausea or vomiting.   SACCHAROMYCES BOULARDII (FLORASTOR) 250 MG CAPSULE    Take 250 mg by mouth daily.  Modified Medications   No medications on file  Discontinued Medications   No medications on file     SIGNIFICANT DIAGNOSTIC EXAMS   04-09-16; chest x-ray: slight right basilar atelectasis but without focal pneumonia or edema  04-11-16; chest x-ray: modest left basilar infiltrate  04-20-16: chest x-ray; 1. Bilateral lower lobe  pneumonia. 2. Bilateral pleural effusions.  04-25-16: chest x-ray: Persistent densities in the lower chest bilaterally. Concern for airspace disease and pleural fluid, particularly on the left side. Findings could represent pneumonia. Recommend continued follow up to ensure resolution or further characterization with CT.   04-25-16: 2-d echo: - Left ventricle: The cavity size was normal. Wall thickness was normal. Systolic function was normal. The estimated ejection fraction was in the range of 55% to 60%. Doppler parameters are consistent with abnormal left ventricular relaxation (grade 1 diastolic dysfunction). - Aortic valve: There was trivial regurgitation. - Mitral valve: Calcified annulus. Mildly thickened leaflets . - Pulmonary arteries: PA peak pressure: 43 mm Hg (S).  LABS REVIEWED:   04-04-16; wbc 7.7; hgb 11.4; hct 38.7; mcv 84.3; plt 538; glucose 101; bun 14.5; creat 0.64; k+ 4.9; na++ 137; ca 9.8 04-20-16: wbc 19.6; hgb 11.4; hct 36.6; mcv 78.4; plt 755; glucose 142; bun 13; creat 0.70; k+ 5.2; na++ 134; liver normal albumin 2.7; blood culture: no growth  HIV; nr;  flu +A 04-22-16: wbc 11.3; hgb 9.0; hct 28.6; mcv 78.1; plt 679; glucose 118; bun 9; creat 0.64; k+ 3.6; na++ 136 04-25-16: wbc 10.1; hgb 9.3; hct 31.3; mcv 80.5 ;plt 669; glucose 100; bun 5; creat 0.52; k+ 3.4; na++ 135; liver normal albumin 2.2    Review of Systems  Constitutional: Negative for malaise/fatigue.  Respiratory: Negative for cough and shortness of breath.   Cardiovascular: Negative for chest pain, palpitations and leg swelling.  Gastrointestinal: Negative for abdominal pain, constipation and heartburn.  Musculoskeletal: Negative for back pain, joint pain and myalgias.  Skin: has sores on back   Neurological: Negative for dizziness.  Psychiatric/Behavioral: The patient is not nervous/anxious.      Physical Exam  Constitutional: She is oriented to person, place, and time. No distress.  Thin   Eyes:  Conjunctivae are normal.  Neck: Neck supple. No JVD present. No thyromegaly present.  Cardiovascular: Normal rate, regular rhythm and intact distal pulses.   Respiratory: Effort normal. No respiratory distress. She has no wheezes.  Has few scattered rhonchi present   GI: Soft. Bowel sounds are normal. She exhibits no distension. There is no tenderness.  Musculoskeletal: She exhibits edema.  Able to move all extremities  Has small joint deformities  Has lower trace lower extremity edema   Lymphadenopathy:    She  has no cervical adenopathy.  Neurological: She is alert and oriented to person, place, and time.  Skin: Skin is warm and dry. She is not diaphoretic.  Psychiatric: She has a normal mood and affect.  Proximal spinal wound: 3 x 1.6 cm slough present Distal spinal wound: 2.7 x 2.8 cm slough present The peri-wound areas are red hot inflamed      ASSESSMENT/ PLAN:  1. unstaged spinal wound decubiti 2. Cellulitis Will begin doxycyline 100 mg twice daily for 2 weeks with florastor    MD is aware of resident's narcotic use and is in agreement with current plan of care. We will attempt to wean resident as apropriate   Ok Edwards NP [facility]  Contact [phone] Monday through Friday 8am- 5pm  After hours call [phone]

## 2016-06-30 ENCOUNTER — Non-Acute Institutional Stay (SKILLED_NURSING_FACILITY): Payer: Medicare Other | Admitting: Adult Health

## 2016-06-30 ENCOUNTER — Encounter: Payer: Self-pay | Admitting: Adult Health

## 2016-06-30 DIAGNOSIS — J9611 Chronic respiratory failure with hypoxia: Secondary | ICD-10-CM

## 2016-06-30 DIAGNOSIS — M159 Polyosteoarthritis, unspecified: Secondary | ICD-10-CM

## 2016-06-30 DIAGNOSIS — D509 Iron deficiency anemia, unspecified: Secondary | ICD-10-CM | POA: Diagnosis not present

## 2016-06-30 DIAGNOSIS — M150 Primary generalized (osteo)arthritis: Secondary | ICD-10-CM | POA: Diagnosis not present

## 2016-06-30 DIAGNOSIS — L89113 Pressure ulcer of right upper back, stage 3: Secondary | ICD-10-CM

## 2016-06-30 DIAGNOSIS — I10 Essential (primary) hypertension: Secondary | ICD-10-CM

## 2016-06-30 DIAGNOSIS — G894 Chronic pain syndrome: Secondary | ICD-10-CM | POA: Diagnosis not present

## 2016-06-30 NOTE — Progress Notes (Signed)
Location:   [facility] Room Number: 151C Place of Service:  SNF (31)   CODE STATUS: full code   Allergies  Allergen Reactions  . Ciprofloxacin Rash    burning    Chief Complaint  Patient presents with  . Medical Management of Chronic Issues     follow up    HPI:  She is a long term resident of this facility being seen for the management of her chronic illnesses. Overall her status is stable.   Past Medical History:  Diagnosis Date  . Anemia 10-05-12   hgb-9.0 on 09-14-12  . Arm vein blood clot 10-05-12   Rt. arm '12  . Arthritis 10-05-12   degenerative joint disease,scoliosis spine  . Aspiration pneumonia ([place])   . Dysrhythmia 10-05-12   tachycardia -tx. Lopressor  . GERD (gastroesophageal reflux disease)   . H/O esophagitis   . H/O hiatal hernia   . Tachycardia   . Transfusion history 10-05-12   4 units blood '12    Past Surgical History:  Procedure Laterality Date  . ABDOMINAL HYSTERECTOMY    . APPENDECTOMY     child  . BACK SURGERY  10-05-12   x5-last fusion with plates  . BREAST ENHANCEMENT SURGERY    . CATARACT EXTRACTION, BILATERAL    . COLONOSCOPY WITH PROPOFOL N/A 10/23/2012   Procedure: COLONOSCOPY WITH PROPOFOL;  Surgeon: Lear Ng, MD;  Location: [facility];  Service: Endoscopy;  Laterality: N/A;  . HOT HEMOSTASIS N/A 10/23/2012   Procedure: HOT HEMOSTASIS (ARGON PLASMA COAGULATION/BICAP);  Surgeon: Lear Ng, MD;  Location: [facility];  Service: Endoscopy;  Laterality: N/A;    Social History   Social History  . Marital status: Widowed    Spouse name: N/A  . Number of children: N/A  . Years of education: N/A   Occupational History  . Not on file.   Social History Main Topics  . Smoking status: Never Smoker  . Smokeless tobacco: Never Used  . Alcohol use No  . Drug use: No  . Sexual activity: Not Currently   Other Topics Concern  . Not on file   Social History Narrative  . No narrative on file    Family History  Problem Relation Age of Onset  . Cancer Mother   . Cancer Father   . Diabetes Sister   . Stroke Neg Hx   . CAD Neg Hx       VITAL SIGNS BP 118/65   Pulse 90   Temp 97.5 F (36.4 C)   Resp 18   Ht 5\' 4"  (1.626 m)   Wt 123 lb 6.4 oz (56 kg)   SpO2 98%   BMI 21.18 kg/m   Patient's Medications  New Prescriptions   No medications on file  Previous Medications   ACETAMINOPHEN (TYLENOL) 325 MG TABLET    Take 650 mg by mouth every 4 (four) hours as needed for fever.   AMINO ACIDS-PROTEIN HYDROLYS (FEEDING SUPPLEMENT, PRO-STAT SUGAR FREE 64,) LIQD    Take 30 mLs by mouth 2 (two) times daily.   ASPIRIN 81 MG TABLET    Take 81 mg by mouth at bedtime.    IPRATROPIUM-ALBUTEROL (DUONEB) 0.5-2.5 (3) MG/3ML SOLN    Take 3 mLs by nebulization every 4 (four) hours as needed (shortness of breath/wheezing).   METOPROLOL (LOPRESSOR) 50 MG TABLET    Take 50 mg by mouth 2 (two) times daily.   MULTIPLE VITAMIN (MULTIVITAMIN WITH MINERALS) TABS TABLET  Take 1 tablet by mouth daily.   NUTRITIONAL SUPPLEMENTS (NUTRITIONAL SHAKE PO)    House Shakes (aka Mighty shake) 4 ounces with all meals   NUTRITIONAL SUPPLEMENTS (NUTRITIONAL SUPPLEMENT PO)    Take by mouth. House 2.0, 237cc two times daiy   OXYCODONE (OXYCONTIN) 40 MG 12 HR TABLET    Take one tablet by mouth every 8 hours for pain   OXYGEN    Inhale 2 L/min into the lungs. Via nasal cannula   PANTOPRAZOLE (PROTONIX) 40 MG TABLET    Take 40 mg by mouth 2 (two) times daily.   POLYETHYLENE GLYCOL (MIRALAX / GLYCOLAX) PACKET    Take 17 g by mouth 2 (two) times daily.   POVIDONE-IODINE EX    Apply topically. Apply to lower back topically daily   PROMETHAZINE (PHENERGAN) 25 MG TABLET    Take 25 mg by mouth every 4 (four) hours as needed for nausea or vomiting.  Modified Medications   No medications on file  Discontinued Medications   COLLAGENASE (SANTYL) OINTMENT    Apply to back topically every day shift for wound care clean area  pat dry apply Santyl to wound bed cover with dry dressing daily   DAPTOMYCIN (CUBICIN) 500 MG INJECTION    Use 250 mg intravenously one time a day for infection for 14 days Gove Cubicin 250 mg IV every day for 14 days for wounds.   SACCHAROMYCES BOULARDII (FLORASTOR) 250 MG CAPSULE    Take 250 mg by mouth daily.     SIGNIFICANT DIAGNOSTIC EXAMS  04-09-16; chest x-ray: slight right basilar atelectasis but without focal pneumonia or edema  04-11-16; chest x-ray: modest left basilar infiltrate  04-20-16: chest x-ray; 1. Bilateral lower lobe pneumonia. 2. Bilateral pleural effusions.  04-25-16: chest x-ray: Persistent densities in the lower chest bilaterally. Concern for airspace disease and pleural fluid, particularly on the left side. Findings could represent pneumonia. Recommend continued follow up to ensure resolution or further characterization with CT.   04-25-16: 2-d echo: - Left ventricle: The cavity size was normal. Wall thickness was normal. Systolic function was normal. The estimated ejection fraction was in the range of 55% to 60%. Doppler parameters are consistent with abnormal left ventricular relaxation (grade 1 diastolic dysfunction). - Aortic valve: There was trivial regurgitation. - Mitral valve: Calcified annulus. Mildly thickened leaflets . - Pulmonary arteries: PA peak pressure: 43 mm Hg (S).  LABS REVIEWED:   04-04-16; wbc 7.7; hgb 11.4; hct 38.7; mcv 84.3; plt 538; glucose 101; bun 14.5; creat 0.64; k+ 4.9; na++ 137; ca 9.8 04-20-16: wbc 19.6; hgb 11.4; hct 36.6; mcv 78.4; plt 755; glucose 142; bun 13; creat 0.70; k+ 5.2; na++ 134; liver normal albumin 2.7; blood culture: no growth  HIV; nr;  flu +A 04-22-16: wbc 11.3; hgb 9.0; hct 28.6; mcv 78.1; plt 679; glucose 118; bun 9; creat 0.64; k+ 3.6; na++ 136 04-25-16: wbc 10.1; hgb 9.3; hct 31.3; mcv 80.5 ;plt 669; glucose 100; bun 5; creat 0.52; k+ 3.4; na++ 135; liver normal albumin 2.2  06-10-16: wbc 7.2; hgb 10.1; hct 33.2; mcv  84.4; plt 516; glucose 125; bun 15.7; creat 0.61; k+ 4.7; na++ 137; liver normal albumin 3.1     Review of Systems  Constitutional: Negative for malaise/fatigue.  Respiratory: Negative for cough and shortness of breath.   Cardiovascular: Negative for chest pain, palpitations and leg swelling.  Gastrointestinal: Negative for abdominal pain, constipation and heartburn.  Musculoskeletal: Negative for back pain, joint pain and myalgias.  Skin:  has sores on back   Neurological: Negative for dizziness.  Psychiatric/Behavioral: The patient is not nervous/anxious.      Physical Exam  Constitutional: She is oriented to person, place, and time. No distress.  Thin   Eyes: Conjunctivae are normal.  Neck: Neck supple. No JVD present. No thyromegaly present.  Cardiovascular: Normal rate, regular rhythm and intact distal pulses.   Respiratory: Effort normal. No respiratory distress. She has no wheezes. Lung sounds clear  GI: Soft. Bowel sounds are normal. She exhibits no distension. There is no tenderness.  Musculoskeletal: She exhibits edema.  Able to move all extremities  Has small joint deformities  Has lower trace lower extremity edema   Lymphadenopathy:    She has no cervical adenopathy.  Neurological: She is alert and oriented to person, place, and time.  Skin: Skin is warm and dry. She is not diaphoretic.  Psychiatric: She has a normal mood and affect.  Proximal spinal wound: 2.4 x 1.3 cm  Distal spinal wound: 3.7 x  3.7 cm green drainage 100% granulation    ASSESSMENT/ PLAN:  1. Hypertension: will continue lopressor 50 mg twice daily asa 81 mg daily   2. Chronic respiratory failure with hypoxia: will continue duoneb every 4 hours as needed is 02 dependent  3. Osteoarthritis: has chronic pain will continue oxycontin 40 mg every 8 hours will monitor  4. Amenia: hgb 10.1 will monitor  5. Gerd: will continue protonix 40 mg twice daily   6. Constipation: will continue miralax  twice daily   7. Pneumonia: will complete augmentin and will monitor   8. Protein calorie malnutrition: albumin 3.1  will continue supplements per facility protocol     MD is aware of resident's narcotic use and is in agreement with current plan of care. We will attempt to wean resident as apropriate     Ok Edwards NP [facility]  Contact [phone] Monday through Friday 8am- 5pm  After hours call [phone]

## 2016-07-07 NOTE — Progress Notes (Signed)
Location:   [facility] of Service:  SNF (31)   CODE STATUS: full code   Allergies  Allergen Reactions  . Ciprofloxacin Rash    burning    Chief Complaint  Patient presents with  . Medical Management of Chronic Issues    HPI:  She is a short term resident of this facility being seen for the management of her chronic illnesses. She has 2 ulcerations on her back which she is on doxycycline for. There is now purulent drainage present. She is not voicing any complaints today.    Past Medical History:  Diagnosis Date  . Anemia 10-05-12   hgb-9.0 on 09-14-12  . Arm vein blood clot 10-05-12   Rt. arm '12  . Arthritis 10-05-12   degenerative joint disease,scoliosis spine  . Aspiration pneumonia ([place])   . Dysrhythmia 10-05-12   tachycardia -tx. Lopressor  . GERD (gastroesophageal reflux disease)   . H/O esophagitis   . H/O hiatal hernia   . Tachycardia   . Transfusion history 10-05-12   4 units blood '12    Past Surgical History:  Procedure Laterality Date  . ABDOMINAL HYSTERECTOMY    . APPENDECTOMY     child  . BACK SURGERY  10-05-12   x5-last fusion with plates  . BREAST ENHANCEMENT SURGERY    . CATARACT EXTRACTION, BILATERAL    . COLONOSCOPY WITH PROPOFOL N/A 10/23/2012   Procedure: COLONOSCOPY WITH PROPOFOL;  Surgeon: Lear Ng, MD;  Location: [facility];  Service: Endoscopy;  Laterality: N/A;  . HOT HEMOSTASIS N/A 10/23/2012   Procedure: HOT HEMOSTASIS (ARGON PLASMA COAGULATION/BICAP);  Surgeon: Lear Ng, MD;  Location: [facility];  Service: Endoscopy;  Laterality: N/A;    Social History   Social History  . Marital status: Widowed    Spouse name: N/A  . Number of children: N/A  . Years of education: N/A   Occupational History  . Not on file.   Social History Main Topics  . Smoking status: Never Smoker  . Smokeless tobacco: Never Used  . Alcohol use No  . Drug use: No  . Sexual activity: Not Currently   Other Topics  Concern  . Not on file   Social History Narrative  . No narrative on file   Family History  Problem Relation Age of Onset  . Cancer Mother   . Cancer Father   . Diabetes Sister   . Stroke Neg Hx   . CAD Neg Hx       VITAL SIGNS BP 112/60   Pulse 100   Temp 97.9 F (36.6 C)   Resp 18   Ht 5\' 4"  (1.626 m)   Wt 128 lb 9.6 oz (58.3 kg)   SpO2 96%   BMI 22.07 kg/m   Patient's Medications  New Prescriptions   No medications on file  Previous Medications   ACETAMINOPHEN (TYLENOL) 325 MG TABLET    Take 650 mg by mouth every 4 (four) hours as needed for fever.   AMINO ACIDS-PROTEIN HYDROLYS (FEEDING SUPPLEMENT, PRO-STAT SUGAR FREE 64,) LIQD    Take 30 mLs by mouth 2 (two) times daily.   ASPIRIN 81 MG TABLET    Take 81 mg by mouth at bedtime.    IPRATROPIUM-ALBUTEROL (DUONEB) 0.5-2.5 (3) MG/3ML SOLN    Take 3 mLs by nebulization every 4 (four) hours as needed (shortness of breath/wheezing).   METOPROLOL (LOPRESSOR) 50 MG TABLET    Take 50 mg by mouth  2 (two) times daily.   MULTIPLE VITAMIN (MULTIVITAMIN WITH MINERALS) TABS TABLET    Take 1 tablet by mouth daily.   NUTRITIONAL SUPPLEMENTS (NUTRITIONAL SHAKE PO)    House Shakes (aka Mighty shake) 4 ounces with all meals   NUTRITIONAL SUPPLEMENTS (NUTRITIONAL SUPPLEMENT PO)    Take by mouth. House 2.0, 237cc two times daiy   OXYCODONE (OXYCONTIN) 40 MG 12 HR TABLET    Take one tablet by mouth every 8 hours for pain   OXYGEN    Inhale 2 L/min into the lungs. Via nasal cannula   PANTOPRAZOLE (PROTONIX) 40 MG TABLET    Take 40 mg by mouth 2 (two) times daily.   POLYETHYLENE GLYCOL (MIRALAX / GLYCOLAX) PACKET    Take 17 g by mouth 2 (two) times daily.   POVIDONE-IODINE EX    Apply topically. Apply to lower back topically daily   PROMETHAZINE (PHENERGAN) 25 MG TABLET    Take 25 mg by mouth every 4 (four) hours as needed for nausea or vomiting.  Modified Medications   No medications on file  Discontinued Medications   No medications on  file     SIGNIFICANT DIAGNOSTIC EXAMS  04-09-16; chest x-ray: slight right basilar atelectasis but without focal pneumonia or edema  04-11-16; chest x-ray: modest left basilar infiltrate  04-20-16: chest x-ray; 1. Bilateral lower lobe pneumonia. 2. Bilateral pleural effusions.  04-25-16: chest x-ray: Persistent densities in the lower chest bilaterally. Concern for airspace disease and pleural fluid, particularly on the left side. Findings could represent pneumonia. Recommend continued follow up to ensure resolution or further characterization with CT.   04-25-16: 2-d echo: - Left ventricle: The cavity size was normal. Wall thickness was normal. Systolic function was normal. The estimated ejection fraction was in the range of 55% to 60%. Doppler parameters are consistent with abnormal left ventricular relaxation (grade 1 diastolic dysfunction). - Aortic valve: There was trivial regurgitation. - Mitral valve: Calcified annulus. Mildly thickened leaflets . - Pulmonary arteries: PA peak pressure: 43 mm Hg (S).  LABS REVIEWED:   04-04-16; wbc 7.7; hgb 11.4; hct 38.7; mcv 84.3; plt 538; glucose 101; bun 14.5; creat 0.64; k+ 4.9; na++ 137; ca 9.8 04-20-16: wbc 19.6; hgb 11.4; hct 36.6; mcv 78.4; plt 755; glucose 142; bun 13; creat 0.70; k+ 5.2; na++ 134; liver normal albumin 2.7; blood culture: no growth  HIV; nr;  flu +A 04-22-16: wbc 11.3; hgb 9.0; hct 28.6; mcv 78.1; plt 679; glucose 118; bun 9; creat 0.64; k+ 3.6; na++ 136 04-25-16: wbc 10.1; hgb 9.3; hct 31.3; mcv 80.5 ;plt 669; glucose 100; bun 5; creat 0.52; k+ 3.4; na++ 135; liver normal albumin 2.2    Review of Systems  Constitutional: Negative for malaise/fatigue.  Respiratory: Negative for cough and shortness of breath.   Cardiovascular: Negative for chest pain, palpitations and leg swelling.  Gastrointestinal: Negative for abdominal pain, constipation and heartburn.  Musculoskeletal: Negative for back pain, joint pain and myalgias.  Skin:  has sores on back   Neurological: Negative for dizziness.  Psychiatric/Behavioral: The patient is not nervous/anxious.      Physical Exam  Constitutional: She is oriented to person, place, and time. No distress.  Thin   Eyes: Conjunctivae are normal.  Neck: Neck supple. No JVD present. No thyromegaly present.  Cardiovascular: Normal rate, regular rhythm and intact distal pulses.   Respiratory: Effort normal. No respiratory distress. She has no wheezes.  Has few scattered rhonchi present   GI: Soft. Bowel sounds are  normal. She exhibits no distension. There is no tenderness.  Musculoskeletal: She exhibits edema.  Able to move all extremities  Has small joint deformities  Has lower trace lower extremity edema   Lymphadenopathy:    She has no cervical adenopathy.  Neurological: She is alert and oriented to person, place, and time.  Skin: Skin is warm and dry. She is not diaphoretic.  Psychiatric: She has a normal mood and affect.  Proximal spinal wound: 3 x 1.6 cm slough present Distal spinal wound: 2.7 x 2.8 cm slough present The peri-wound areas are red hot inflamed Purulent drainage present.        ASSESSMENT/ PLAN:  1. Hypertension: will continue lopressor 50 mg twice daily asa 81 mg daily   2. Chronic respiratory failure with hypoxia: will continue duoneb every 4 hours as needed is 02 dependent  3. Osteoarthritis: has chronic pain will continue oxycontin 40 mg every 8 hours will monitor  4. Amenia: hgb 9.3 will monitor  5. Gerd: will continue protonix 40 mg twice daily   6. Constipation: will continue miralax twice daily   7. Protein calorie malnutrition: albumin 2.2 will continue supplements per facility protocol   8.  Cellulitis of back and ulceration of back: will continue current treatment; will stop doxycycline and will begin cubicin 2.5 gm IV daily for 14 days and will monitor     MD is aware of resident's narcotic use and is in agreement with current plan  of care. We will attempt to wean resident as apropriate   Ok Edwards NP [facility]  Contact [phone] Monday through Friday 8am- 5pm  After hours call [phone]

## 2016-07-28 ENCOUNTER — Encounter: Payer: Self-pay | Admitting: Adult Health

## 2016-07-28 ENCOUNTER — Non-Acute Institutional Stay (SKILLED_NURSING_FACILITY): Payer: Medicare Other | Admitting: Adult Health

## 2016-07-28 DIAGNOSIS — M150 Primary generalized (osteo)arthritis: Secondary | ICD-10-CM | POA: Diagnosis not present

## 2016-07-28 DIAGNOSIS — J9611 Chronic respiratory failure with hypoxia: Secondary | ICD-10-CM

## 2016-07-28 DIAGNOSIS — G894 Chronic pain syndrome: Secondary | ICD-10-CM

## 2016-07-28 DIAGNOSIS — M159 Polyosteoarthritis, unspecified: Secondary | ICD-10-CM

## 2016-07-28 DIAGNOSIS — I10 Essential (primary) hypertension: Secondary | ICD-10-CM | POA: Diagnosis not present

## 2016-07-28 DIAGNOSIS — D509 Iron deficiency anemia, unspecified: Secondary | ICD-10-CM

## 2016-07-28 DIAGNOSIS — L89124 Pressure ulcer of left upper back, stage 4: Secondary | ICD-10-CM

## 2016-07-28 DIAGNOSIS — E43 Unspecified severe protein-calorie malnutrition: Secondary | ICD-10-CM

## 2016-07-28 NOTE — Progress Notes (Signed)
Location:   [facility] Room Number: 151 C Place of Service:  SNF (31)   CODE STATUS: Full Code  Allergies  Allergen Reactions  . Ciprofloxacin Rash    burning    Chief Complaint  Patient presents with  . Medical Management of Chronic Issues    1 month follow up    HPI:  She is a short term resident of this facility being seen for the management of her chronic illnesses . Overall her status is stable. She does get out of bed daily. She is not voicing any complaints at this time. There are no nursing concerns at this time.    Past Medical History:  Diagnosis Date  . Anemia 10-05-12   hgb-9.0 on 09-14-12  . Arm vein blood clot 10-05-12   Rt. arm '12  . Arthritis 10-05-12   degenerative joint disease,scoliosis spine  . Aspiration pneumonia ([place])   . Dysrhythmia 10-05-12   tachycardia -tx. Lopressor  . GERD (gastroesophageal reflux disease)   . H/O esophagitis   . H/O hiatal hernia   . Tachycardia   . Transfusion history 10-05-12   4 units blood '12    Past Surgical History:  Procedure Laterality Date  . ABDOMINAL HYSTERECTOMY    . APPENDECTOMY     child  . BACK SURGERY  10-05-12   x5-last fusion with plates  . BREAST ENHANCEMENT SURGERY    . CATARACT EXTRACTION, BILATERAL    . COLONOSCOPY WITH PROPOFOL N/A 10/23/2012   Procedure: COLONOSCOPY WITH PROPOFOL;  Surgeon: Lear Ng, MD;  Location: [facility];  Service: Endoscopy;  Laterality: N/A;  . HOT HEMOSTASIS N/A 10/23/2012   Procedure: HOT HEMOSTASIS (ARGON PLASMA COAGULATION/BICAP);  Surgeon: Lear Ng, MD;  Location: [facility];  Service: Endoscopy;  Laterality: N/A;    Social History   Social History  . Marital status: Widowed    Spouse name: N/A  . Number of children: N/A  . Years of education: N/A   Occupational History  . Not on file.   Social History Main Topics  . Smoking status: Never Smoker  . Smokeless tobacco: Never Used  . Alcohol use No  . Drug use: No    . Sexual activity: Not Currently   Other Topics Concern  . Not on file   Social History Narrative  . No narrative on file   Family History  Problem Relation Age of Onset  . Cancer Mother   . Cancer Father   . Diabetes Sister   . Stroke Neg Hx   . CAD Neg Hx       VITAL SIGNS BP 127/83   Pulse (!) 103   Temp 97 F (36.1 C)   Resp 18   Ht 5\' 4"  (1.626 m)   Wt 120 lb 9.6 oz (54.7 kg)   SpO2 98%   BMI 20.70 kg/m   Patient's Medications  New Prescriptions   No medications on file  Previous Medications   ACETAMINOPHEN (TYLENOL) 325 MG TABLET    Take 650 mg by mouth every 4 (four) hours as needed for fever.   AMINO ACIDS-PROTEIN HYDROLYS (FEEDING SUPPLEMENT, PRO-STAT SUGAR FREE 64,) LIQD    Take 30 mLs by mouth 2 (two) times daily.   ASPIRIN 81 MG TABLET    Take 81 mg by mouth at bedtime.    IPRATROPIUM-ALBUTEROL (DUONEB) 0.5-2.5 (3) MG/3ML SOLN    Take 3 mLs by nebulization every 4 (four) hours as needed (shortness of breath/wheezing).  METOPROLOL (LOPRESSOR) 50 MG TABLET    Take 50 mg by mouth 2 (two) times daily.   MULTIPLE VITAMIN (MULTIVITAMIN WITH MINERALS) TABS TABLET    Take 1 tablet by mouth daily.   NUTRITIONAL SUPPLEMENTS (NUTRITIONAL SHAKE PO)    House Shakes (aka Mighty shake) 4 ounces with all meals   NUTRITIONAL SUPPLEMENTS (NUTRITIONAL SUPPLEMENT PO)    Take by mouth. House 2.0, 237cc two times daiy   OXYCODONE (OXYCONTIN) 40 MG 12 HR TABLET    Take one tablet by mouth every 8 hours for pain   OXYGEN    Inhale 2 L/min into the lungs. Via nasal cannula   PANTOPRAZOLE (PROTONIX) 40 MG TABLET    Take 40 mg by mouth 2 (two) times daily.   POLYETHYLENE GLYCOL (MIRALAX / GLYCOLAX) PACKET    Take 17 g by mouth 2 (two) times daily.   PROMETHAZINE (PHENERGAN) 25 MG TABLET    Take 25 mg by mouth every 4 (four) hours as needed for nausea or vomiting.   SODIUM HYPOCHLORITE 0.057 % LIQD    Apply to lower back topically every day shift for wound care  Modified  Medications   No medications on file  Discontinued Medications   POVIDONE-IODINE EX    Apply topically. Apply to lower back topically daily     SIGNIFICANT DIAGNOSTIC EXAMS  04-09-16; chest x-ray: slight right basilar atelectasis but without focal pneumonia or edema  04-11-16; chest x-ray: modest left basilar infiltrate  04-20-16: chest x-ray; 1. Bilateral lower lobe pneumonia. 2. Bilateral pleural effusions.  04-25-16: chest x-ray: Persistent densities in the lower chest bilaterally. Concern for airspace disease and pleural fluid, particularly on the left side. Findings could represent pneumonia. Recommend continued follow up to ensure resolution or further characterization with CT.   04-25-16: 2-d echo: - Left ventricle: The cavity size was normal. Wall thickness was normal. Systolic function was normal. The estimated ejection fraction was in the range of 55% to 60%. Doppler parameters are consistent with abnormal left ventricular relaxation (grade 1 diastolic dysfunction). - Aortic valve: There was trivial regurgitation. - Mitral valve: Calcified annulus. Mildly thickened leaflets . - Pulmonary arteries: PA peak pressure: 43 mm Hg (S).  LABS REVIEWED:   04-04-16; wbc 7.7; hgb 11.4; hct 38.7; mcv 84.3; plt 538; glucose 101; bun 14.5; creat 0.64; k+ 4.9; na++ 137; ca 9.8 04-20-16: wbc 19.6; hgb 11.4; hct 36.6; mcv 78.4; plt 755; glucose 142; bun 13; creat 0.70; k+ 5.2; na++ 134; liver normal albumin 2.7; blood culture: no growth  HIV; nr;  flu +A 04-22-16: wbc 11.3; hgb 9.0; hct 28.6; mcv 78.1; plt 679; glucose 118; bun 9; creat 0.64; k+ 3.6; na++ 136 04-25-16: wbc 10.1; hgb 9.3; hct 31.3; mcv 80.5 ;plt 669; glucose 100; bun 5; creat 0.52; k+ 3.4; na++ 135; liver normal albumin 2.2  06-10-16: wbc 7.2; hgb 10.1; hct 33.2; mcv 84.4; plt 516; glucose 125; bun 15.7; creat 0.61; k+ 4.7; na++ 137; liver normal albumin 3.1     Review of Systems  Constitutional: Negative for malaise/fatigue.    Respiratory: Negative for cough and shortness of breath.   Cardiovascular: Negative for chest pain, palpitations and leg swelling.  Gastrointestinal: Negative for abdominal pain, constipation and heartburn.  Musculoskeletal: Negative for back pain, joint pain and myalgias.  Skin: has sores on back   Neurological: Negative for dizziness.  Psychiatric/Behavioral: The patient is not nervous/anxious.      Physical Exam  Constitutional: She is oriented to person, place, and  time. No distress.  Thin   Eyes: Conjunctivae are normal.  Neck: Neck supple. No JVD present. No thyromegaly present.  Cardiovascular: Normal rate, regular rhythm and intact distal pulses.   Respiratory: Effort normal. No respiratory distress. She has no wheezes. Lung sounds clear  GI: Soft. Bowel sounds are normal. She exhibits no distension. There is no tenderness.  Musculoskeletal: She exhibits edema.  Able to move all extremities  Has small joint deformities  Has lower trace lower extremity edema   Lymphadenopathy:    She has no cervical adenopathy.  Neurological: She is alert and oriented to person, place, and time.  Skin: Skin is warm and dry. She is not diaphoretic.  Psychiatric: She has a normal mood and affect.  Thoracic spine stage IV: 3.2 x 2.8 x 2.1 cm undermining 3.7 cm @ 11     ASSESSMENT/ PLAN:  1. Hypertension: will continue lopressor 50 mg twice daily asa 81 mg daily   2. Chronic respiratory failure with hypoxia: will continue duoneb every 4 hours as needed is 02 dependent  3. Osteoarthritis: has chronic pain will continue oxycontin 40 mg every 8 hours will monitor  4. Amenia: hgb 10.1 will monitor  5. Gerd: will continue protonix 40 mg twice daily   6. Constipation: will continue miralax twice daily   7. Protein calorie malnutrition: albumin 3.1  will continue supplements per facility protocol   8. Stage IV thoracic ulceration: is followed by wound dr will not make changes will  monitor     MD is aware of resident's narcotic use and is in agreement with current plan of care. We will attempt to wean resident as apropriate     Ok Edwards NP [facility]  Contact [phone] Monday through Friday 8am- 5pm  After hours call [phone]

## 2016-08-09 ENCOUNTER — Other Ambulatory Visit: Payer: Self-pay | Admitting: *Deleted

## 2016-08-09 MED ORDER — HYDROCODONE BITARTRATE ER 40 MG PO T24A: 1.0000 | EXTENDED_RELEASE_TABLET | Freq: Every day | ORAL | 0 refills | Status: DC

## 2016-08-09 NOTE — Telephone Encounter (Signed)
[place]

## 2016-08-17 ENCOUNTER — Other Ambulatory Visit: Payer: Self-pay | Admitting: *Deleted

## 2016-08-17 MED ORDER — HYDROCODONE BITARTRATE ER 40 MG PO T24A
3.0000 | EXTENDED_RELEASE_TABLET | Freq: Every day | ORAL | 0 refills | Status: DC
Start: 2016-08-17 — End: 2016-08-25

## 2016-08-17 NOTE — Telephone Encounter (Signed)
[facility] #: [phone] Fax#: [phone]  

## 2016-08-25 ENCOUNTER — Non-Acute Institutional Stay (SKILLED_NURSING_FACILITY): Payer: Medicare Other | Admitting: Adult Health

## 2016-08-25 ENCOUNTER — Encounter: Payer: Self-pay | Admitting: Adult Health

## 2016-08-25 DIAGNOSIS — L89124 Pressure ulcer of left upper back, stage 4: Secondary | ICD-10-CM | POA: Diagnosis not present

## 2016-08-25 DIAGNOSIS — M150 Primary generalized (osteo)arthritis: Secondary | ICD-10-CM

## 2016-08-25 DIAGNOSIS — I10 Essential (primary) hypertension: Secondary | ICD-10-CM | POA: Diagnosis not present

## 2016-08-25 DIAGNOSIS — D509 Iron deficiency anemia, unspecified: Secondary | ICD-10-CM

## 2016-08-25 DIAGNOSIS — M159 Polyosteoarthritis, unspecified: Secondary | ICD-10-CM

## 2016-08-25 DIAGNOSIS — J9611 Chronic respiratory failure with hypoxia: Secondary | ICD-10-CM | POA: Diagnosis not present

## 2016-08-25 DIAGNOSIS — E43 Unspecified severe protein-calorie malnutrition: Secondary | ICD-10-CM

## 2016-08-25 DIAGNOSIS — G894 Chronic pain syndrome: Secondary | ICD-10-CM | POA: Diagnosis not present

## 2016-08-25 NOTE — Progress Notes (Signed)
Location:   [place] Room Number: 151 C Place of Service:  SNF (31)   CODE STATUS: Full Code  Allergies  Allergen Reactions  . Ciprofloxacin Rash    burning    Chief Complaint  Patient presents with  . Medical Management of Chronic Issues    1 month follow up    HPI:  She is a short term resident of this facility being seen for the management of her chronic illnesses. Overall her status is stable. She tells me that her pain is being managed by MS contin every 12 hours. There are no nursing concerns at this time.    Past Medical History:  Diagnosis Date  . Anemia 10-05-12   hgb-9.0 on 09-14-12  . Arm vein blood clot 10-05-12   Rt. arm '12  . Arthritis 10-05-12   degenerative joint disease,scoliosis spine  . Aspiration pneumonia ([place])   . Dysrhythmia 10-05-12   tachycardia -tx. Lopressor  . GERD (gastroesophageal reflux disease)   . H/O esophagitis   . H/O hiatal hernia   . Tachycardia   . Transfusion history 10-05-12   4 units blood '12    Past Surgical History:  Procedure Laterality Date  . ABDOMINAL HYSTERECTOMY    . APPENDECTOMY     child  . BACK SURGERY  10-05-12   x5-last fusion with plates  . BREAST ENHANCEMENT SURGERY    . CATARACT EXTRACTION, BILATERAL    . COLONOSCOPY WITH PROPOFOL N/A 10/23/2012   Procedure: COLONOSCOPY WITH PROPOFOL;  Surgeon: Lear Ng, MD;  Location: [facility];  Service: Endoscopy;  Laterality: N/A;  . HOT HEMOSTASIS N/A 10/23/2012   Procedure: HOT HEMOSTASIS (ARGON PLASMA COAGULATION/BICAP);  Surgeon: Lear Ng, MD;  Location: [facility];  Service: Endoscopy;  Laterality: N/A;    Social History   Social History  . Marital status: Widowed    Spouse name: N/A  . Number of children: N/A  . Years of education: N/A   Occupational History  . Not on file.   Social History Main Topics  . Smoking status: Never Smoker  . Smokeless tobacco: Never Used  . Alcohol use No  . Drug use: No  .  Sexual activity: Not Currently   Other Topics Concern  . Not on file   Social History Narrative  . No narrative on file   Family History  Problem Relation Age of Onset  . Cancer Mother   . Cancer Father   . Diabetes Sister   . Stroke Neg Hx   . CAD Neg Hx       VITAL SIGNS BP 123/61   Pulse 79   Temp 97.9 F (36.6 C)   Resp 16   Ht 5\' 4"  (1.626 m)   Wt 116 lb 6.4 oz (52.8 kg)   SpO2 98%   BMI 19.98 kg/m   Patient's Medications  New Prescriptions   No medications on file  Previous Medications   ACETAMINOPHEN (TYLENOL) 325 MG TABLET    Take 650 mg by mouth every 4 (four) hours as needed for fever.   AMINO ACIDS-PROTEIN HYDROLYS (FEEDING SUPPLEMENT, PRO-STAT SUGAR FREE 64,) LIQD    Take 30 mLs by mouth 2 (two) times daily.   ASPIRIN 81 MG TABLET    Take 81 mg by mouth at bedtime.    IPRATROPIUM-ALBUTEROL (DUONEB) 0.5-2.5 (3) MG/3ML SOLN    Take 3 mLs by nebulization every 4 (four) hours as needed (shortness of breath/wheezing).   METOPROLOL (LOPRESSOR) 50  MG TABLET    Take 50 mg by mouth 2 (two) times daily.   MORPHINE (MS CONTIN) 100 MG 12 HR TABLET    Take 100 mg by mouth every 12 (twelve) hours.   MULTIPLE VITAMIN (MULTIVITAMIN WITH MINERALS) TABS TABLET    Take 1 tablet by mouth daily.   NUTRITIONAL SUPPLEMENTS (NUTRITIONAL SHAKE PO)    House Shakes (aka Mighty shake) 4 ounces with all meals   NUTRITIONAL SUPPLEMENTS (NUTRITIONAL SUPPLEMENT PO)    Take by mouth. House 2.0, 237cc two times daiy   OXYGEN    Inhale 2 L/min into the lungs. Via nasal cannula   PANTOPRAZOLE (PROTONIX) 40 MG TABLET    Take 40 mg by mouth 2 (two) times daily.   POLYETHYLENE GLYCOL (MIRALAX / GLYCOLAX) PACKET    Take 17 g by mouth 2 (two) times daily.   PROMETHAZINE (PHENERGAN) 25 MG TABLET    Take 25 mg by mouth every 4 (four) hours as needed for nausea or vomiting.  Modified Medications   No medications on file  Discontinued Medications     SIGNIFICANT DIAGNOSTIC EXAMS  04-09-16;  chest x-ray: slight right basilar atelectasis but without focal pneumonia or edema  04-11-16; chest x-ray: modest left basilar infiltrate  04-20-16: chest x-ray; 1. Bilateral lower lobe pneumonia. 2. Bilateral pleural effusions.  04-25-16: chest x-ray: Persistent densities in the lower chest bilaterally. Concern for airspace disease and pleural fluid, particularly on the left side. Findings could represent pneumonia. Recommend continued follow up to ensure resolution or further characterization with CT.   04-25-16: 2-d echo: - Left ventricle: The cavity size was normal. Wall thickness was normal. Systolic function was normal. The estimated ejection fraction was in the range of 55% to 60%. Doppler parameters are consistent with abnormal left ventricular relaxation (grade 1 diastolic dysfunction). - Aortic valve: There was trivial regurgitation. - Mitral valve: Calcified annulus. Mildly thickened leaflets . - Pulmonary arteries: PA peak pressure: 43 mm Hg (S).  LABS REVIEWED:   04-04-16; wbc 7.7; hgb 11.4; hct 38.7; mcv 84.3; plt 538; glucose 101; bun 14.5; creat 0.64; k+ 4.9; na++ 137; ca 9.8 04-20-16: wbc 19.6; hgb 11.4; hct 36.6; mcv 78.4; plt 755; glucose 142; bun 13; creat 0.70; k+ 5.2; na++ 134; liver normal albumin 2.7; blood culture: no growth  HIV; nr;  flu +A 04-22-16: wbc 11.3; hgb 9.0; hct 28.6; mcv 78.1; plt 679; glucose 118; bun 9; creat 0.64; k+ 3.6; na++ 136 04-25-16: wbc 10.1; hgb 9.3; hct 31.3; mcv 80.5 ;plt 669; glucose 100; bun 5; creat 0.52; k+ 3.4; na++ 135; liver normal albumin 2.2  06-10-16: wbc 7.2; hgb 10.1; hct 33.2; mcv 84.4; plt 516; glucose 125; bun 15.7; creat 0.61; k+ 4.7; na++ 137; liver normal albumin 3.1     Review of Systems  Constitutional: Negative for malaise/fatigue.  Respiratory: Negative for cough and shortness of breath.   Cardiovascular: Negative for chest pain, palpitations and leg swelling.  Gastrointestinal: Negative for abdominal pain, constipation and  heartburn.  Musculoskeletal: Negative for back pain, joint pain and myalgias.  Skin: has sores on back   Neurological: Negative for dizziness.  Psychiatric/Behavioral: The patient is not nervous/anxious.      Physical Exam  Constitutional: She is oriented to person, place, and time. No distress.  Thin   Eyes: Conjunctivae are normal.  Neck: Neck supple. No JVD present. No thyromegaly present.  Cardiovascular: Normal rate, regular rhythm and intact distal pulses.   Respiratory: Effort normal. No respiratory distress. She  has no wheezes. Lung sounds clear  GI: Soft. Bowel sounds are normal. She exhibits no distension. There is no tenderness.  Musculoskeletal: She exhibits edema.  Able to move all extremities  Has small joint deformities  Has lower trace lower extremity edema   Lymphadenopathy:    She has no cervical adenopathy.  Neurological: She is alert and oriented to person, place, and time.  Skin: Skin is warm and dry. She is not diaphoretic.  Psychiatric: She has a normal mood and affect.  Thoracic spine stage IV: 2.9 x 1.6 x 0.6 cm undermining 3.7 cm @ 12     ASSESSMENT/ PLAN:  1. Hypertension: b/p 123/61  will continue lopressor 50 mg twice daily asa 81 mg daily   2. Chronic respiratory failure with hypoxia: will continue duoneb every 4 hours as needed is 02 at 2L dependent  3. Osteoarthritis: has chronic pain will continue ms contin 100 mg every 12 hours   4. [place]: hgb 10.1 will monitor  5. Gerd: will continue protonix 40 mg twice daily   6. Constipation: will continue miralax twice daily   7. Protein calorie malnutrition: albumin 3.1  will continue supplements per facility protocol prostat 30 cc twice daily   8. Stage IV thoracic ulceration: is followed by wound dr will not make changes will monitor    MD is aware of resident's narcotic use and is in agreement with current plan of care. We will attempt to wean resident as apropriate     Ok Edwards  NP [facility]  Contact [phone] Monday through Friday 8am- 5pm  After hours call [phone]

## 2021-01-09 ENCOUNTER — Other Ambulatory Visit: Payer: Self-pay

## 2021-01-09 ENCOUNTER — Emergency Department (HOSPITAL_COMMUNITY)
Admission: EM | Admit: 2021-01-09 | Discharge: 2021-01-10 | Disposition: A | Discharge status: Home / Self Care | Payer: Medicare (Managed Care) | Attending: Emergency Medicine | Admitting: Emergency Medicine

## 2021-01-09 ENCOUNTER — Emergency Department (HOSPITAL_COMMUNITY): Payer: Medicare (Managed Care)

## 2021-01-09 DIAGNOSIS — R0600 Dyspnea, unspecified: Secondary | ICD-10-CM | POA: Diagnosis not present

## 2021-01-09 DIAGNOSIS — I10 Essential (primary) hypertension: Secondary | ICD-10-CM | POA: Insufficient documentation

## 2021-01-09 DIAGNOSIS — Z20822 Contact with and (suspected) exposure to covid-19: Secondary | ICD-10-CM | POA: Insufficient documentation

## 2021-01-09 DIAGNOSIS — R197 Diarrhea, unspecified: Secondary | ICD-10-CM

## 2021-01-09 DIAGNOSIS — Z7982 Long term (current) use of aspirin: Secondary | ICD-10-CM | POA: Insufficient documentation

## 2021-01-09 DIAGNOSIS — Z85038 Personal history of other malignant neoplasm of large intestine: Secondary | ICD-10-CM | POA: Diagnosis not present

## 2021-01-09 DIAGNOSIS — R000 Tachycardia, unspecified: Secondary | ICD-10-CM | POA: Insufficient documentation

## 2021-01-09 DIAGNOSIS — Z79899 Other long term (current) drug therapy: Secondary | ICD-10-CM | POA: Insufficient documentation

## 2021-01-09 LAB — CBC WITH DIFFERENTIAL/PLATELET
Abs Immature Granulocytes: 0.03 10*3/uL (ref 0.00–0.07)
Basophils Absolute: 0 10*3/uL (ref 0.0–0.1)
Basophils Relative: 0 %
Eosinophils Absolute: 0 10*3/uL (ref 0.0–0.5)
Eosinophils Relative: 0 %
HCT: 38.8 % (ref 36.0–46.0)
Hemoglobin: 12.2 g/dL (ref 12.0–15.0)
Immature Granulocytes: 0 %
Lymphocytes Relative: 16 %
Lymphs Abs: 1.1 10*3/uL (ref 0.7–4.0)
MCH: 31.8 pg (ref 26.0–34.0)
MCHC: 31.4 g/dL (ref 30.0–36.0)
MCV: 101 fL — ABNORMAL HIGH (ref 80.0–100.0)
Monocytes Absolute: 0.7 10*3/uL (ref 0.1–1.0)
Monocytes Relative: 10 %
Neutro Abs: 5.1 10*3/uL (ref 1.7–7.7)
Neutrophils Relative %: 74 %
Platelets: 400 10*3/uL (ref 150–400)
RBC: 3.84 MIL/uL — ABNORMAL LOW (ref 3.87–5.11)
RDW: 13.4 % (ref 11.5–15.5)
WBC: 7 10*3/uL (ref 4.0–10.5)
nRBC: 0 % (ref 0.0–0.2)

## 2021-01-09 LAB — URINALYSIS, ROUTINE W REFLEX MICROSCOPIC
Bacteria, UA: NONE SEEN
Bilirubin Urine: NEGATIVE
Glucose, UA: NEGATIVE mg/dL
Hgb urine dipstick: NEGATIVE
Ketones, ur: NEGATIVE mg/dL
Nitrite: NEGATIVE
Protein, ur: NEGATIVE mg/dL
Specific Gravity, Urine: 1.021 (ref 1.005–1.030)
pH: 5 (ref 5.0–8.0)

## 2021-01-09 LAB — COMPREHENSIVE METABOLIC PANEL
ALT: 13 U/L (ref 0–44)
AST: 23 U/L (ref 15–41)
Albumin: 3 g/dL — ABNORMAL LOW (ref 3.5–5.0)
Alkaline Phosphatase: 44 U/L (ref 38–126)
Anion gap: 9 (ref 5–15)
BUN: 23 mg/dL (ref 8–23)
CO2: 24 mmol/L (ref 22–32)
Calcium: 9 mg/dL (ref 8.9–10.3)
Chloride: 104 mmol/L (ref 98–111)
Creatinine, Ser: 0.87 mg/dL (ref 0.44–1.00)
GFR, Estimated: >60 mL/min (ref >60)
Glucose, Bld: 117 mg/dL — ABNORMAL HIGH (ref 70–99)
Potassium: 3.7 mmol/L (ref 3.5–5.1)
Sodium: 137 mmol/L (ref 135–145)
Total Bilirubin: 0.5 mg/dL (ref 0.3–1.2)
Total Protein: 6.6 g/dL (ref 6.5–8.1)

## 2021-01-09 LAB — MAGNESIUM: Magnesium: 1.8 mg/dL (ref 1.7–2.4)

## 2021-01-09 LAB — BRAIN NATRIURETIC PEPTIDE: B Natriuretic Peptide: 230.2 pg/mL — ABNORMAL HIGH (ref 0.0–100.0)

## 2021-01-09 LAB — RESP PANEL BY RT-PCR (FLU A&B, COVID) ARPGX2
Influenza A by PCR: NEGATIVE
Influenza B by PCR: NEGATIVE
SARS Coronavirus 2 by RT PCR: NEGATIVE

## 2021-01-09 MED ORDER — SODIUM CHLORIDE 0.9 % IV BOLUS
1000.0000 mL | Freq: Once | INTRAVENOUS | Status: AC
  Administered 2021-01-09: 1000 mL via INTRAVENOUS

## 2021-01-09 NOTE — ED Provider Notes (Signed)
[facility] EMERGENCY DEPARTMENT Provider Note   CSN: [id number] Arrival date & time: 01/09/21  2106     History Chief Complaint  Patient presents with   Diarrhea    [name] is a 87 y.o. female.  HPI Patient presents from nursing facility with concern for diarrhea.  This is new over the past day.  She notes that she has been having loose stool about once an hour. She notes that she is generally well, though she was diagnosed with pneumonia 3 days ago.  Due to poor venous access she had PICC line placed in the right arm.  Since that time she has been receiving antibiotics.  She continues to receive these in spite of her diarrhea.  She notes that today, she has no abdominal pain, no chest pain, minimal dyspnea.  However, with concern for ongoing episodes of diarrhea and worsening weakness she requested transfer for evaluation. No relief with anything.    Past Medical History:  Diagnosis Date   Anemia 10-05-12   hgb-9.0 on 09-14-12   Arm vein blood clot 10-05-12   Rt. arm '12   Arthritis 10-05-12   degenerative joint disease,scoliosis spine   Aspiration pneumonia ([place])    Dysrhythmia 10-05-12   tachycardia -tx. Lopressor   GERD (gastroesophageal reflux disease)    H/O esophagitis    H/O hiatal hernia    Tachycardia    Transfusion history 10-05-12   4 units blood '12    Patient Active Problem List   Diagnosis Date Noted   Decubitus ulcer of upper back 06/27/2016   Chronic respiratory failure with hypoxia ([place]) 05/03/2016   Primary osteoarthritis involving multiple joints 05/03/2016   Chronic pain syndrome 05/03/2016   Hypoxia    Aspiration pneumonia ([place]) 03/24/2016   Protein-calorie malnutrition, severe ([place]) 03/24/2016   Sinus tachycardia 01/27/2014   Essential hypertension 01/27/2014   Colitis 01/24/2014   Iron deficiency anemia 10/23/2012   Benign neoplasm of colon 10/23/2012    Past Surgical History:  Procedure Laterality Date   ABDOMINAL  HYSTERECTOMY     APPENDECTOMY     child   BACK SURGERY  10-05-12   x5-last fusion with plates   BREAST ENHANCEMENT SURGERY     CATARACT EXTRACTION, BILATERAL     COLONOSCOPY WITH PROPOFOL N/A 10/23/2012   Procedure: COLONOSCOPY WITH PROPOFOL;  Surgeon: Lear Ng, MD;  Location: [facility];  Service: Endoscopy;  Laterality: N/A;   HOT HEMOSTASIS N/A 10/23/2012   Procedure: HOT HEMOSTASIS (ARGON PLASMA COAGULATION/BICAP);  Surgeon: Lear Ng, MD;  Location: [facility];  Service: Endoscopy;  Laterality: N/A;     OB History   No obstetric history on file.     Family History  Problem Relation Age of Onset   Cancer Mother    Cancer Father    Diabetes Sister    Stroke Neg Hx    CAD Neg Hx     Social History   Tobacco Use   Smoking status: Never   Smokeless tobacco: Never  Substance Use Topics   Alcohol use: No   Drug use: No    Home Medications Prior to Admission medications   Medication Sig Start Date End Date Taking? Authorizing Provider  acetaminophen (TYLENOL) 325 MG tablet Take 650 mg by mouth every 4 (four) hours as needed for fever.    [provider]  Amino Acids-Protein Hydrolys (FEEDING SUPPLEMENT, PRO-STAT SUGAR FREE 64,) LIQD Take 30 mLs by mouth 2 (two) times daily.  [provider]  aspirin 81 MG tablet Take 81 mg by mouth at bedtime.     [provider]  ipratropium-albuterol (DUONEB) 0.5-2.5 (3) MG/3ML SOLN Take 3 mLs by nebulization every 4 (four) hours as needed (shortness of breath/wheezing).    [provider]  metoprolol (LOPRESSOR) 50 MG tablet Take 50 mg by mouth 2 (two) times daily.    [provider]  morphine (MS CONTIN) 100 MG 12 hr tablet Take 100 mg by mouth every 12 (twelve) hours.    [provider]  Multiple Vitamin (MULTIVITAMIN WITH MINERALS) TABS tablet Take 1 tablet by mouth daily.    [provider]  Nutritional Supplements (NUTRITIONAL SHAKE PO) House  Shakes (aka Mighty shake) 4 ounces with all meals    [provider]  Nutritional Supplements (NUTRITIONAL SUPPLEMENT PO) Take by mouth. House 2.0, 237cc two times daiy    [provider]  OXYGEN Inhale 2 L/min into the lungs. Via nasal cannula    [provider]  pantoprazole (PROTONIX) 40 MG tablet Take 40 mg by mouth 2 (two) times daily.    [provider]  polyethylene glycol (MIRALAX / GLYCOLAX) packet Take 17 g by mouth 2 (two) times daily. 03/26/16   Charlynne Cousins, MD  promethazine (PHENERGAN) 25 MG tablet Take 25 mg by mouth every 4 (four) hours as needed for nausea or vomiting.    [provider]    Allergies    Ciprofloxacin  Review of Systems   Review of Systems  Constitutional:        Per HPI, otherwise negative  HENT:         Per HPI, otherwise negative  Respiratory:         Per HPI, otherwise negative  Cardiovascular:        Per HPI, otherwise negative  Gastrointestinal:  Positive for diarrhea. Negative for vomiting.  Endocrine:       Negative aside from HPI  Genitourinary:        Neg aside from HPI   Musculoskeletal:        Per HPI, otherwise negative  Skin: Negative.   Neurological:  Positive for weakness. Negative for syncope.   Physical Exam Updated Vital Signs BP 132/61 (BP Location: Right Arm)   Pulse (!) 110   Temp 98.5 F (36.9 C) (Oral)   Resp 16   Ht 5\' 3"  (1.6 m)   Wt 59 kg   SpO2 98%   BMI 23.03 kg/m   Physical Exam Vitals and nursing note reviewed.  Constitutional:      General: She is not in acute distress.    Appearance: She is well-developed.  HENT:     Head: Normocephalic and atraumatic.  Eyes:     Conjunctiva/sclera: Conjunctivae normal.  Cardiovascular:     Rate and Rhythm: Normal rate and regular rhythm.  Pulmonary:     Effort: Pulmonary effort is normal. No respiratory distress.     Breath sounds: Normal breath sounds. No stridor.  Abdominal:     General: There is no  distension.     Tenderness: There is no abdominal tenderness. There is no guarding.  Musculoskeletal:     Comments: R UE PICC in place - unremarkable  Skin:    General: Skin is warm and dry.  Neurological:     Mental Status: She is alert and oriented to person, place, and time.     Cranial Nerves: No cranial nerve deficit.    ED  Results / Procedures / Treatments   Labs (all labs ordered are listed, but only abnormal results are displayed) Labs Reviewed  COMPREHENSIVE METABOLIC PANEL - Abnormal; Notable for the following components:      Result Value   Glucose, Bld 117 (*)    Albumin 3.0 (*)    All other components within normal limits  CBC WITH DIFFERENTIAL/PLATELET - Abnormal; Notable for the following components:   RBC 3.84 (*)    MCV 101.0 (*)    All other components within normal limits  URINALYSIS, ROUTINE W REFLEX MICROSCOPIC - Abnormal; Notable for the following components:   Leukocytes,Ua SMALL (*)    All other components within normal limits  RESP PANEL BY RT-PCR (FLU A&B, COVID) ARPGX2  MAGNESIUM  BRAIN NATRIURETIC PEPTIDE    EKG EKG Interpretation  Date/Time:  Saturday January 09 2021 21:39:20 EDT Ventricular Rate:  116 PR Interval:  159 QRS Duration: 91 QT Interval:  314 QTC Calculation: 407 R Axis:   -46 Text Interpretation: Sinus tachycardia Supraventricular bigeminy Inferior infarct, old Anterior infarct, old Lateral leads are also involved Abnormal ECG Confirmed by Carmin Muskrat [phone]) on 01/09/2021 10:45:27 PM  Radiology DG Chest 2 View  Result Date: 01/09/2021 CLINICAL DATA:  Diarrhea EXAM: CHEST - 2 VIEW COMPARISON:  04/25/2016 FINDINGS: Trace bilateral pleural effusions. Chronic elevation right diaphragm. Asymmetric ground-glass opacity in the right thorax with more focal airspace disease in the right upper lobe. Mild airspace disease at the left base. Moderate to large hiatal hernia. Spinal hardware. IMPRESSION: 1. Mild diffuse opacity in the  right thorax with more focal airspace disease in the right upper lobe concerning for pneumonia. 2. Probable small pleural effusions 3. Hiatal hernia Electronically Signed   By: Donavan Foil M.D.   On: 01/09/2021 23:01    Procedures Procedures   Medications Ordered in ED Medications  sodium chloride 0.9 % bolus 1,000 mL (1,000 mLs Intravenous New Bag/Given 01/09/21 2255)    ED Course  I have reviewed the triage vital signs and the nursing notes.  Pertinent labs & imaging results that were available during my care of the patient were reviewed by me and considered in my medical decision making (see chart for details).   11:48 PM Tachycardia has improved, blood pressure about the same.  Patient's urinalysis has resulted, this is unremarkable, but she is awaiting additional labs, will require repeat evaluation.  X-ray consistent with previously demonstrated pneumonia.  Dr. Christy Gentles is aware of the patient. MDM Rules/Calculators/A&P MDM Number of Diagnoses or Management Options Diarrhea in adult patient: new, needed workup Tachycardia: new, needed workup   Amount and/or Complexity of Data Reviewed Clinical lab tests: ordered and reviewed Tests in the radiology section of CPT: ordered and reviewed Tests in the medicine section of CPT: reviewed and ordered Decide to obtain previous medical records or to obtain history from someone other than the patient: yes Obtain history from someone other than the patient: yes Review and summarize past medical records: yes Discuss the patient with other providers: yes Independent visualization of images, tracings, or specimens: yes  Risk of Complications, Morbidity, and/or Mortality Presenting problems: high Diagnostic procedures: high Management options: high  Critical Care Total time providing critical care: < 30 minutes  Patient Progress Patient progress: stable   Final Clinical Impression(s) / ED Diagnoses Final diagnoses:  Diarrhea in  adult patient  Tachycardia     Carmin Muskrat, MD 01/09/21 2352

## 2021-01-09 NOTE — ED Triage Notes (Addendum)
87 y/o female who woke up Thursday 9/22 morning with right-sided chest pain and dyspnea, chest pain was worse with deep inspiration. At 8am the facility obtained a chest x-ray and diagnosed her with PNA. She additionally reports having blood work and ECG. She then had a single lumen right PICC line placed that evening. She started on IV Levaquin she believes on Friday AM 9/23 in addition to PO Azithromycin and 3L O2 via [place]. She reports onset of diarrhea this morning that occurred after receiving AM dose of antibiotics. She reports discomfort to her bottom as a result of the diarrhea and additionally was sitting in stool for prolonged time due to staff not being able to get to her. She describes the diarrhea as dark black liquid, occurring every hour. She reports not being able to take in PO meds due to nausea. She was able to drink 3 ensure today. She reports receiving nausea medication around 4:30/5pm today. She has not eaten anything since Thursday night. She cannot recall when she last voided.

## 2021-01-10 DIAGNOSIS — R197 Diarrhea, unspecified: Secondary | ICD-10-CM | POA: Diagnosis not present

## 2021-01-10 MED ORDER — LACTATED RINGERS IV BOLUS
1000.0000 mL | Freq: Once | INTRAVENOUS | Status: AC
  Administered 2021-01-10: 1000 mL via INTRAVENOUS

## 2021-01-10 MED ORDER — ONDANSETRON HCL 4 MG/2ML IJ SOLN
4.0000 mg | Freq: Once | INTRAMUSCULAR | Status: AC
  Administered 2021-01-10: 4 mg via INTRAVENOUS
  Filled 2021-01-10: qty 2

## 2021-01-10 NOTE — ED Provider Notes (Signed)
Vital signs have improved.  Heart rate is around 100.  There is no hypoxia.  No hypotension. No further diarrhea here.  I feel she is appropriate for discharge back to her facility where she can get ongoing care for her pneumonia.  Patient has a PICC line in place.  Patient has been hemodynamically appropriate in the ER   Ripley Fraise, MD 01/10/21 [phone]

## 2021-01-10 NOTE — ED Notes (Signed)
[facility] called to transport patient back to [place].

## 2021-01-10 NOTE — ED Provider Notes (Signed)
EKG Interpretation  Date/Time:  Sunday January 10 2021 02:20:09 EDT Ventricular Rate:  98 PR Interval:  162 QRS Duration: 87 QT Interval:  319 QTC Calculation: 408 R Axis:   -40 Text Interpretation: Sinus rhythm Inferior infarct, old Anterior infarct, old Confirmed by Ripley Fraise [phone]) on 01/10/2021 2:23:24 AM          Ripley Fraise, MD 01/10/21 [phone]

## 2021-01-10 NOTE — ED Provider Notes (Signed)
I assumed care at signout to follow-up on labs.  Patient is a nursing home resident with recent pneumonia receiving IV antibiotics.  She is now been having persistent diarrhea.  Patient is notably tachycardic but overall reports feeling improved.  Her abdomen is soft. She will be given another round of IV fluids.   Ripley Fraise, MD 01/10/21 [phone]

## 2021-01-10 NOTE — ED Notes (Signed)
Patient has not had any diarrhea since presenting to ED.

## 2021-10-24 ENCOUNTER — Observation Stay (HOSPITAL_COMMUNITY): Payer: Medicare (Managed Care)

## 2021-10-24 ENCOUNTER — Inpatient Hospital Stay (HOSPITAL_COMMUNITY)
Admission: EM | Admit: 2021-10-24 | Discharge: 2021-10-30 | DRG: 871 | Disposition: A | Discharge status: Skilled Nursing Facility | Payer: Medicare (Managed Care) | Source: Skilled Nursing Facility | Attending: Internal Medicine | Admitting: Internal Medicine

## 2021-10-24 ENCOUNTER — Emergency Department (HOSPITAL_COMMUNITY): Payer: Medicare (Managed Care)

## 2021-10-24 ENCOUNTER — Encounter (HOSPITAL_COMMUNITY): Payer: Self-pay

## 2021-10-24 ENCOUNTER — Other Ambulatory Visit: Payer: Self-pay

## 2021-10-24 DIAGNOSIS — M1990 Unspecified osteoarthritis, unspecified site: Secondary | ICD-10-CM | POA: Diagnosis present

## 2021-10-24 DIAGNOSIS — R000 Tachycardia, unspecified: Secondary | ICD-10-CM | POA: Diagnosis not present

## 2021-10-24 DIAGNOSIS — K219 Gastro-esophageal reflux disease without esophagitis: Secondary | ICD-10-CM | POA: Diagnosis present

## 2021-10-24 DIAGNOSIS — G894 Chronic pain syndrome: Secondary | ICD-10-CM | POA: Diagnosis present

## 2021-10-24 DIAGNOSIS — Z981 Arthrodesis status: Secondary | ICD-10-CM

## 2021-10-24 DIAGNOSIS — Z7982 Long term (current) use of aspirin: Secondary | ICD-10-CM

## 2021-10-24 DIAGNOSIS — J204 Acute bronchitis due to parainfluenza virus: Secondary | ICD-10-CM | POA: Diagnosis present

## 2021-10-24 DIAGNOSIS — N179 Acute kidney failure, unspecified: Secondary | ICD-10-CM

## 2021-10-24 DIAGNOSIS — R109 Unspecified abdominal pain: Secondary | ICD-10-CM

## 2021-10-24 DIAGNOSIS — A419 Sepsis, unspecified organism: Secondary | ICD-10-CM

## 2021-10-24 DIAGNOSIS — A4189 Other specified sepsis: Principal | ICD-10-CM | POA: Diagnosis present

## 2021-10-24 DIAGNOSIS — I10 Essential (primary) hypertension: Secondary | ICD-10-CM | POA: Diagnosis present

## 2021-10-24 DIAGNOSIS — N3289 Other specified disorders of bladder: Secondary | ICD-10-CM | POA: Diagnosis present

## 2021-10-24 DIAGNOSIS — K5900 Constipation, unspecified: Secondary | ICD-10-CM

## 2021-10-24 DIAGNOSIS — J9601 Acute respiratory failure with hypoxia: Secondary | ICD-10-CM

## 2021-10-24 DIAGNOSIS — I4891 Unspecified atrial fibrillation: Secondary | ICD-10-CM | POA: Diagnosis not present

## 2021-10-24 DIAGNOSIS — I2510 Atherosclerotic heart disease of native coronary artery without angina pectoris: Secondary | ICD-10-CM | POA: Diagnosis present

## 2021-10-24 DIAGNOSIS — Z20822 Contact with and (suspected) exposure to covid-19: Secondary | ICD-10-CM | POA: Diagnosis present

## 2021-10-24 DIAGNOSIS — E876 Hypokalemia: Secondary | ICD-10-CM | POA: Diagnosis not present

## 2021-10-24 DIAGNOSIS — J129 Viral pneumonia, unspecified: Secondary | ICD-10-CM | POA: Diagnosis present

## 2021-10-24 DIAGNOSIS — Z79891 Long term (current) use of opiate analgesic: Secondary | ICD-10-CM

## 2021-10-24 DIAGNOSIS — I480 Paroxysmal atrial fibrillation: Secondary | ICD-10-CM

## 2021-10-24 DIAGNOSIS — Z79899 Other long term (current) drug therapy: Secondary | ICD-10-CM

## 2021-10-24 DIAGNOSIS — Z90710 Acquired absence of both cervix and uterus: Secondary | ICD-10-CM

## 2021-10-24 DIAGNOSIS — K449 Diaphragmatic hernia without obstruction or gangrene: Secondary | ICD-10-CM | POA: Diagnosis present

## 2021-10-24 DIAGNOSIS — Z881 Allergy status to other antibiotic agents status: Secondary | ICD-10-CM

## 2021-10-24 DIAGNOSIS — D649 Anemia, unspecified: Secondary | ICD-10-CM | POA: Diagnosis present

## 2021-10-24 DIAGNOSIS — Z993 Dependence on wheelchair: Secondary | ICD-10-CM

## 2021-10-24 DIAGNOSIS — I4892 Unspecified atrial flutter: Secondary | ICD-10-CM | POA: Diagnosis not present

## 2021-10-24 DIAGNOSIS — Z66 Do not resuscitate: Secondary | ICD-10-CM | POA: Diagnosis present

## 2021-10-24 DIAGNOSIS — J189 Pneumonia, unspecified organism: Secondary | ICD-10-CM

## 2021-10-24 LAB — CBC
HCT: 40.6 % (ref 36.0–46.0)
Hemoglobin: 12.8 g/dL (ref 12.0–15.0)
MCH: 30.6 pg (ref 26.0–34.0)
MCHC: 31.5 g/dL (ref 30.0–36.0)
MCV: 97.1 fL (ref 80.0–100.0)
Platelets: 358 10*3/uL (ref 150–400)
RBC: 4.18 MIL/uL (ref 3.87–5.11)
RDW: 14.6 % (ref 11.5–15.5)
WBC: 14.7 10*3/uL — ABNORMAL HIGH (ref 4.0–10.5)
nRBC: 0 % (ref 0.0–0.2)

## 2021-10-24 LAB — RESPIRATORY PANEL BY PCR

## 2021-10-24 LAB — URINALYSIS, ROUTINE W REFLEX MICROSCOPIC
Bilirubin Urine: NEGATIVE
Glucose, UA: NEGATIVE mg/dL
Hgb urine dipstick: NEGATIVE
Ketones, ur: 5 mg/dL — AB
Nitrite: NEGATIVE
Protein, ur: NEGATIVE mg/dL
Specific Gravity, Urine: 1.014 (ref 1.005–1.030)
pH: 5 (ref 5.0–8.0)

## 2021-10-24 LAB — COMPREHENSIVE METABOLIC PANEL
ALT: 9 U/L (ref 0–44)
AST: 21 U/L (ref 15–41)
Albumin: 3.4 g/dL — ABNORMAL LOW (ref 3.5–5.0)
Alkaline Phosphatase: 48 U/L (ref 38–126)
Anion gap: 17 — ABNORMAL HIGH (ref 5–15)
BUN: 28 mg/dL — ABNORMAL HIGH (ref 8–23)
CO2: 25 mmol/L (ref 22–32)
Calcium: 9.4 mg/dL (ref 8.9–10.3)
Chloride: 95 mmol/L — ABNORMAL LOW (ref 98–111)
Creatinine, Ser: 1.34 mg/dL — ABNORMAL HIGH (ref 0.44–1.00)
GFR, Estimated: 38 mL/min — ABNORMAL LOW (ref >60)
Glucose, Bld: 121 mg/dL — ABNORMAL HIGH (ref 70–99)
Potassium: 4 mmol/L (ref 3.5–5.1)
Sodium: 137 mmol/L (ref 135–145)
Total Bilirubin: 0.7 mg/dL (ref 0.3–1.2)
Total Protein: 7.1 g/dL (ref 6.5–8.1)

## 2021-10-24 LAB — BRAIN NATRIURETIC PEPTIDE: B Natriuretic Peptide: 524.3 pg/mL — ABNORMAL HIGH (ref 0.0–100.0)

## 2021-10-24 LAB — LIPASE, BLOOD: Lipase: 19 U/L (ref 11–51)

## 2021-10-24 LAB — STREP PNEUMONIAE URINARY ANTIGEN: Strep Pneumo Urinary Antigen: NEGATIVE

## 2021-10-24 LAB — RESP PANEL BY RT-PCR (FLU A&B, COVID) ARPGX2
Influenza A by PCR: NEGATIVE
Influenza B by PCR: NEGATIVE
SARS Coronavirus 2 by RT PCR: NEGATIVE

## 2021-10-24 LAB — LACTIC ACID, PLASMA: Lactic Acid, Venous: 1.7 mmol/L (ref 0.5–1.9)

## 2021-10-24 LAB — PROCALCITONIN: Procalcitonin: 0.18 ng/mL

## 2021-10-24 LAB — TSH: TSH: 1.702 u[IU]/mL (ref 0.350–4.500)

## 2021-10-24 LAB — D-DIMER, QUANTITATIVE: D-Dimer, Quant: 0.78 ug/mL-FEU — ABNORMAL HIGH (ref 0.00–0.50)

## 2021-10-24 MED ORDER — MELATONIN 3 MG PO TABS
3.0000 mg | ORAL_TABLET | Freq: Every day | ORAL | Status: DC
Start: 2021-10-24 — End: 2021-10-31
  Administered 2021-10-24 – 2021-10-30 (×7): 3 mg via ORAL
  Filled 2021-10-24 (×7): qty 1

## 2021-10-24 MED ORDER — ACETAMINOPHEN 650 MG RE SUPP: 650.0000 mg | Freq: Four times a day (QID) | RECTAL | Status: DC | PRN

## 2021-10-24 MED ORDER — HEPARIN (PORCINE) 25000 UT/250ML-% IV SOLN
950.0000 [IU]/h | INTRAVENOUS | Status: DC
  Administered 2021-10-24: 700 [IU]/h via INTRAVENOUS
  Filled 2021-10-24: qty 250

## 2021-10-24 MED ORDER — SODIUM CHLORIDE 0.9 % IV SOLN
500.0000 mg | Freq: Once | INTRAVENOUS | Status: DC
  Filled 2021-10-24: qty 5

## 2021-10-24 MED ORDER — CHLORHEXIDINE GLUCONATE 0.12% ORAL RINSE (MEDLINE KIT)
15.0000 mL | Freq: Two times a day (BID) | OROMUCOSAL | Status: DC
  Administered 2021-10-25 – 2021-10-29 (×5): 15 mL via OROMUCOSAL

## 2021-10-24 MED ORDER — SODIUM CHLORIDE 0.9 % IV SOLN
3.0000 g | Freq: Once | INTRAVENOUS | Status: AC
  Administered 2021-10-24: 3 g via INTRAVENOUS
  Filled 2021-10-24: qty 8

## 2021-10-24 MED ORDER — POLYETHYLENE GLYCOL 3350 17 G PO PACK: 17.0000 g | PACK | Freq: Every day | ORAL | Status: DC | PRN

## 2021-10-24 MED ORDER — SODIUM CHLORIDE 0.9 % IV SOLN
1.0000 g | Freq: Once | INTRAVENOUS | Status: DC
  Filled 2021-10-24: qty 10

## 2021-10-24 MED ORDER — SODIUM CHLORIDE 0.9 % IV SOLN: 250.0000 mL | INTRAVENOUS | Status: DC | PRN

## 2021-10-24 MED ORDER — LACTATED RINGERS IV BOLUS
1000.0000 mL | Freq: Once | INTRAVENOUS | Status: AC
  Administered 2021-10-24: 1000 mL via INTRAVENOUS

## 2021-10-24 MED ORDER — MORPHINE SULFATE ER 100 MG PO TBCR
100.0000 mg | EXTENDED_RELEASE_TABLET | Freq: Two times a day (BID) | ORAL | Status: DC
  Administered 2021-10-24 – 2021-10-30 (×13): 100 mg via ORAL
  Filled 2021-10-24 (×13): qty 1

## 2021-10-24 MED ORDER — SODIUM CHLORIDE 0.9 % IV SOLN
500.0000 mg | INTRAVENOUS | Status: DC
  Administered 2021-10-24: 500 mg via INTRAVENOUS

## 2021-10-24 MED ORDER — FLEET ENEMA 7-19 GM/118ML RE ENEM
1.0000 | ENEMA | Freq: Once | RECTAL | Status: AC
  Administered 2021-10-24: 1 via RECTAL
  Filled 2021-10-24: qty 1

## 2021-10-24 MED ORDER — ONDANSETRON HCL 4 MG PO TABS
4.0000 mg | ORAL_TABLET | Freq: Four times a day (QID) | ORAL | Status: DC | PRN
  Administered 2021-10-25: 4 mg via ORAL
  Filled 2021-10-24: qty 1

## 2021-10-24 MED ORDER — SODIUM CHLORIDE 0.9 % IV SOLN
1.0000 g | INTRAVENOUS | Status: DC
  Administered 2021-10-24: 1 g via INTRAVENOUS
  Filled 2021-10-24: qty 10

## 2021-10-24 MED ORDER — MORPHINE SULFATE (PF) 2 MG/ML IV SOLN: 1.0000 mg | INTRAVENOUS | Status: DC | PRN

## 2021-10-24 MED ORDER — HEPARIN BOLUS VIA INFUSION
2500.0000 [IU] | Freq: Once | INTRAVENOUS | Status: AC
  Administered 2021-10-24: 2500 [IU] via INTRAVENOUS
  Filled 2021-10-24: qty 2500

## 2021-10-24 MED ORDER — PANTOPRAZOLE SODIUM 40 MG PO TBEC
40.0000 mg | DELAYED_RELEASE_TABLET | Freq: Every day | ORAL | Status: DC
  Administered 2021-10-25 – 2021-10-30 (×6): 40 mg via ORAL
  Filled 2021-10-24 (×6): qty 1

## 2021-10-24 MED ORDER — FENTANYL CITRATE PF 50 MCG/ML IJ SOSY
25.0000 ug | PREFILLED_SYRINGE | Freq: Once | INTRAMUSCULAR | Status: AC
  Administered 2021-10-24: 25 ug via INTRAVENOUS
  Filled 2021-10-24: qty 1

## 2021-10-24 MED ORDER — ACETAMINOPHEN 325 MG PO TABS
650.0000 mg | ORAL_TABLET | Freq: Four times a day (QID) | ORAL | Status: DC | PRN
  Administered 2021-10-25: 650 mg via ORAL
  Filled 2021-10-24 (×3): qty 2

## 2021-10-24 MED ORDER — FUROSEMIDE 20 MG PO TABS: 20.0000 mg | ORAL_TABLET | Freq: Every day | ORAL | Status: DC

## 2021-10-24 MED ORDER — FAMOTIDINE 20 MG PO TABS
20.0000 mg | ORAL_TABLET | Freq: Every day | ORAL | Status: DC
  Administered 2021-10-24 – 2021-10-30 (×7): 20 mg via ORAL
  Filled 2021-10-24 (×7): qty 1

## 2021-10-24 MED ORDER — ASPIRIN 81 MG PO TBEC
81.0000 mg | DELAYED_RELEASE_TABLET | Freq: Every day | ORAL | Status: DC
  Administered 2021-10-24: 81 mg via ORAL
  Filled 2021-10-24: qty 1

## 2021-10-24 MED ORDER — IOHEXOL 350 MG/ML SOLN
50.0000 mL | Freq: Once | INTRAVENOUS | Status: AC | PRN
  Administered 2021-10-24: 50 mL via INTRAVENOUS

## 2021-10-24 MED ORDER — GLYCERIN (LAXATIVE) 2 G RE SUPP
1.0000 | Freq: Once | RECTAL | Status: AC
  Administered 2021-10-24: 1 via RECTAL
  Filled 2021-10-24: qty 1

## 2021-10-24 MED ORDER — SODIUM CHLORIDE 0.9% FLUSH
3.0000 mL | Freq: Two times a day (BID) | INTRAVENOUS | Status: DC
  Administered 2021-10-24 – 2021-10-29 (×9): 3 mL via INTRAVENOUS

## 2021-10-24 MED ORDER — SUCRALFATE 1 G PO TABS
1.0000 g | ORAL_TABLET | Freq: Three times a day (TID) | ORAL | Status: DC
Start: 2021-10-25 — End: 2021-10-31
  Administered 2021-10-25 – 2021-10-30 (×18): 1 g via ORAL
  Filled 2021-10-24 (×19): qty 1

## 2021-10-24 MED ORDER — SODIUM CHLORIDE 0.9% FLUSH
3.0000 mL | INTRAVENOUS | Status: DC | PRN
  Administered 2021-10-24 (×2): 3 mL via INTRAVENOUS

## 2021-10-24 MED ORDER — FERROUS SULFATE 325 (65 FE) MG PO TABS
325.0000 mg | ORAL_TABLET | Freq: Every day | ORAL | Status: DC
  Administered 2021-10-25 – 2021-10-30 (×6): 325 mg via ORAL
  Filled 2021-10-24 (×6): qty 1

## 2021-10-24 MED ORDER — ONDANSETRON HCL 4 MG/2ML IJ SOLN
4.0000 mg | Freq: Once | INTRAMUSCULAR | Status: AC
  Administered 2021-10-24: 4 mg via INTRAVENOUS
  Filled 2021-10-24: qty 2

## 2021-10-24 MED ORDER — METOPROLOL TARTRATE 25 MG PO TABS: 50.0000 mg | ORAL_TABLET | Freq: Two times a day (BID) | ORAL | Status: DC

## 2021-10-24 MED ORDER — ONDANSETRON HCL 4 MG/2ML IJ SOLN
4.0000 mg | Freq: Four times a day (QID) | INTRAMUSCULAR | Status: DC | PRN
  Administered 2021-10-24 – 2021-10-26 (×3): 4 mg via INTRAVENOUS
  Filled 2021-10-24 (×3): qty 2

## 2021-10-24 MED ORDER — IOHEXOL 300 MG/ML  SOLN
60.0000 mL | Freq: Once | INTRAMUSCULAR | Status: AC | PRN
Start: 2021-10-24 — End: 2021-10-24
  Administered 2021-10-24: 60 mL via INTRAVENOUS

## 2021-10-24 MED ORDER — SERTRALINE HCL 25 MG PO TABS
25.0000 mg | ORAL_TABLET | Freq: Every day | ORAL | Status: DC
  Administered 2021-10-25 – 2021-10-30 (×6): 25 mg via ORAL
  Filled 2021-10-24 (×6): qty 1

## 2021-10-24 MED ORDER — METOPROLOL TARTRATE 25 MG PO TABS
50.0000 mg | ORAL_TABLET | Freq: Two times a day (BID) | ORAL | Status: DC
  Administered 2021-10-24: 50 mg via ORAL
  Filled 2021-10-24: qty 2

## 2021-10-24 NOTE — Assessment & Plan Note (Addendum)
Patient on MS contin '100mg'$  BID. Confirmed on her SNF medication  Will continue this for now, no additional pain meds

## 2021-10-24 NOTE — Progress Notes (Signed)
ANTICOAGULATION CONSULT NOTE - Initial Consult  Pharmacy Consult for heparin Indication: atrial fibrillation  Allergies  Allergen Reactions   Ciprofloxacin Rash    burning    Patient Measurements:   Heparin Dosing Weight: TBW  Vital Signs: Temp: 97.6 F (36.4 C) (07/09 1026) BP: 129/56 (07/09 1630) Pulse Rate: 104 (07/09 1630)  Labs: Recent Labs    10/24/21 1044  HGB 12.8  HCT 40.6  PLT 358  CREATININE 1.34*    CrCl cannot be calculated (Unknown ideal weight.).   Medical History: Past Medical History:  Diagnosis Date   Anemia 10-05-12   hgb-9.0 on 09-14-12   Arm vein blood clot 10-05-12   Rt. arm '12   Arthritis 10-05-12   degenerative joint disease,scoliosis spine   Aspiration pneumonia ([place])    Dysrhythmia 10-05-12   tachycardia -tx. Lopressor   GERD (gastroesophageal reflux disease)    H/O esophagitis    H/O hiatal hernia    Tachycardia    Transfusion history 10-05-12   4 units blood '12   Assessment: [name] presenting with N/V and weakness.  New onset afib with RVR in ED, she is not on anticoagulation PTA, CBC wnl.  Goal of Therapy:  Heparin level 0.3-0.7 units/ml Monitor platelets by anticoagulation protocol: Yes   Plan:  Heparin 2500 units IV x 1, and gtt at 700 units/hr F/u 8 hour heparin level F/u long term [facility] plan   Bertis Ruddy, PharmD Clinical Pharmacist ED Pharmacist Phone # [phone] 10/24/2021 5:52 PM

## 2021-10-24 NOTE — ED Notes (Signed)
Attempted CTA with [place] inserted 20 g in left upper arm by EDP. CT notifies IV infiltrated. IV consult placed.

## 2021-10-24 NOTE — Assessment & Plan Note (Addendum)
CT abdomen/pelvis with no acute abdominal findings except constipation with moderate stool burden. Given enema in ED and has miralax prn  abdominal pain improved Has large hiatal hernia which could be contributing Continue to monitor

## 2021-10-24 NOTE — ED Notes (Signed)
RN on 2L of O2

## 2021-10-24 NOTE — ED Triage Notes (Signed)
Patient arrived from [facility] and complains of abdominal pain with nausea and vomiting and weakness. Ems found patient with BP 70s and oxygen in 80s, arrived on 2l. Chills with same

## 2021-10-24 NOTE — Assessment & Plan Note (Addendum)
88 year old female presenting with abdominal pain found to be hypoxic on room air to 86% and requiring 4-5L via  to maintain oxygen >92%.  -admit to progressive -possible pneumonia on CT abdomen and possible aspiration. She has no hx of suspected aspiration, but had one episode of vomiting. Received Unasyn in ED, will broaden to cover for CAP with rocephin and azithromycin -elevated d-dimer in setting of acute respiratory failure with hypoxia: checking CTA for PE, started on heparin with paroxysmal atrial fibrillation  -BNP pending, but appears euvolemic on exam with no hx of CHF; however, with possible new PAF may be elevated. Echo pending -RVP +for parainfluenza virus 3. She has history in 2018 of pneumonia requiring 5L of oxygen at home  -has large hiatal hernia that could also be contributing to a restrictive type picture  -wean as tolerated

## 2021-10-24 NOTE — ED Notes (Addendum)
Daughter Kenia Teagarden requesting phone call when pt gets a room. [phone]. Jeannie Done updated via phone call.

## 2021-10-24 NOTE — ED Provider Notes (Signed)
[facility] EMERGENCY DEPARTMENT Provider Note   CSN: [id number] Arrival date & time: 10/24/21  1016     History  No chief complaint on file.   [name] is a 88 y.o. female.  HPI  88 year old female with medical history significant for arthritis, GERD, history of hiatal hernia, anemia, history of esophagitis, aspiration pneumonia, presenting to the emergency department from [facility] with nausea, vomiting, weakness.  EMS found the patient with blood pressure in the 70s and oxygen saturations in the 80s with subsequent improvement on 2 L O2.  The patient endorsed chills.  She denied any cough or fever.  She endorsed generalized abdominal discomfort.  Her last bowel movement was yesterday and was normal and she states that she is passing gas.  She continues to endorse generalized abdominal discomfort with nausea and NBNB emesis.  Denies any coffee-ground emesis or hematemesis.  Denies any melena or hematochezia.  Home Medications Prior to Admission medications   Medication Sig Start Date End Date Taking? Authorizing Provider  acetaminophen (TYLENOL) 325 MG tablet Take 650 mg by mouth every 4 (four) hours as needed for fever.    [provider]  Amino Acids-Protein Hydrolys (FEEDING SUPPLEMENT, PRO-STAT SUGAR FREE 64,) LIQD Take 30 mLs by mouth 2 (two) times daily.    [provider]  aspirin 81 MG tablet Take 81 mg by mouth at bedtime.     [provider]  ipratropium-albuterol (DUONEB) 0.5-2.5 (3) MG/3ML SOLN Take 3 mLs by nebulization every 4 (four) hours as needed (shortness of breath/wheezing).    [provider]  metoprolol (LOPRESSOR) 50 MG tablet Take 50 mg by mouth 2 (two) times daily.    [provider]  morphine (MS CONTIN) 100 MG 12 hr tablet Take 100 mg by mouth every 12 (twelve) hours.    [provider]  Multiple Vitamin (MULTIVITAMIN WITH MINERALS) TABS tablet Take 1 tablet by mouth daily.     [provider]  Nutritional Supplements (NUTRITIONAL SHAKE PO) House Shakes (aka Mighty shake) 4 ounces with all meals    [provider]  Nutritional Supplements (NUTRITIONAL SUPPLEMENT PO) Take by mouth. House 2.0, 237cc two times daiy    [provider]  OXYGEN Inhale 2 L/min into the lungs. Via nasal cannula    [provider]  pantoprazole (PROTONIX) 40 MG tablet Take 40 mg by mouth 2 (two) times daily.    [provider]  polyethylene glycol (MIRALAX / GLYCOLAX) packet Take 17 g by mouth 2 (two) times daily. 03/26/16   Charlynne Cousins, MD  promethazine (PHENERGAN) 25 MG tablet Take 25 mg by mouth every 4 (four) hours as needed for nausea or vomiting.    [provider]      Allergies    Ciprofloxacin    Review of Systems   Review of Systems  All other systems reviewed and are negative.   Physical Exam Updated Vital Signs BP (!) 129/56   Pulse (!) 104   Temp 97.6 F (36.4 C)   Resp (!) 23   SpO2 (!) 89%  Physical Exam Vitals and nursing note reviewed. Exam conducted with a chaperone present.  Constitutional:      General: She is not in acute distress.    Appearance: She is well-developed.  HENT:     Head: Normocephalic and atraumatic.  Eyes:     Conjunctiva/sclera: Conjunctivae normal.  Cardiovascular:     Rate and Rhythm: Tachycardia present. Rhythm irregular.  Pulmonary:     Effort: Pulmonary effort is normal. No respiratory distress.     Breath sounds: Normal breath sounds.  Abdominal:     Palpations: Abdomen is soft.     Tenderness: There is abdominal tenderness. There is no guarding or rebound.  Genitourinary:    Comments: No fecal impaction Musculoskeletal:        General: No swelling.     Cervical back: Neck supple.     Right lower leg: No edema.     Left lower leg: No edema.  Skin:    General: Skin is warm and dry.     Capillary Refill: Capillary refill takes less than 2 seconds.  Neurological:      General: No focal deficit present.     Mental Status: She is alert and oriented to person, place, and time. Mental status is at baseline.  Psychiatric:        Mood and Affect: Mood normal.     ED Results / Procedures / Treatments   Labs (all labs ordered are listed, but only abnormal results are displayed) Labs Reviewed  COMPREHENSIVE METABOLIC PANEL - Abnormal; Notable for the following components:      Result Value   Chloride 95 (*)    Glucose, Bld 121 (*)    BUN 28 (*)    Creatinine, Ser 1.34 (*)    Albumin 3.4 (*)    GFR, Estimated 38 (*)    Anion gap 17 (*)    All other components within normal limits  CBC - Abnormal; Notable for the following components:   WBC 14.7 (*)    All other components within normal limits  URINALYSIS, ROUTINE W REFLEX MICROSCOPIC - Abnormal; Notable for the following components:   Ketones, ur 5 (*)    Leukocytes,Ua TRACE (*)    Bacteria, UA RARE (*)    All other components within normal limits  RESP PANEL BY RT-PCR (FLU A&B, COVID) ARPGX2  RESPIRATORY PANEL BY PCR  LIPASE, BLOOD  LACTIC ACID, PLASMA  D-DIMER, QUANTITATIVE  BRAIN NATRIURETIC PEPTIDE  PROCALCITONIN  TSH    EKG EKG Interpretation  Date/Time:  Sunday October 24 2021 10:40:42 EDT Ventricular Rate:  97 PR Interval:  174 QRS Duration: 70 QT Interval:  344 QTC Calculation: 436 R Axis:   -51 Text Interpretation: Normal sinus rhythm Left axis deviation Anteroseptal infarct , age undetermined Abnormal ECG When compared with ECG of 10-Jan-2021 02:20, PREVIOUS ECG IS PRESENT Confirmed by Regan Lemming ([id number]) on 10/24/2021 12:53:42 PM  Radiology CT ABDOMEN PELVIS W CONTRAST  Result Date: 10/24/2021 CLINICAL DATA:  Nausea/vomiting Abdominal pain, acute, nonlocalized EXAM: CT ABDOMEN AND PELVIS WITH CONTRAST TECHNIQUE: Multidetector CT imaging of the abdomen and pelvis was performed using the standard protocol following bolus administration of intravenous contrast. RADIATION DOSE  REDUCTION: This exam was performed according to the departmental dose-optimization program which includes automated exposure control, adjustment of the mA and/or kV according to patient size and/or use of iterative reconstruction technique. CONTRAST:  1m OMNIPAQUE IOHEXOL 300 MG/ML  SOLN COMPARISON:  CT abdomen pelvis 03/28/2016 FINDINGS: Lower chest: Elevated right hemidiaphragm. Right basilar consolidation. Additional tree-in-bud opacities in the right middle lobe, lingula, and left lower lobe. Mild bronchiectasis. Right middle lobe atelectasis. Bilateral breast implants with calcified capsules. Hepatobiliary: Unchanged hepatic cysts. The gallbladder is unremarkable. Pancreas: Unremarkable. No pancreatic ductal dilatation or surrounding inflammatory changes. Spleen: Unchanged lobulation of the spleen with areas of scarring and evidence of of prior infarcts. No new splenic abnormality.  Adrenals/Urinary Tract: Adrenal glands are unremarkable. No hydronephrosis or nephrolithiasis. Unchanged bilateral renal cyst. No follow-up imaging is recommended. There is marked distension of the urinary bladder. Stomach/Bowel: Large hiatal hernia with intrathoracic stomach. There is no evidence of bowel obstruction.Prior appendectomy. Mild-to-moderate stool burden. Scattered colonic diverticula. Vascular/Lymphatic: Aortoiliac atherosclerosis. There is a posterior mediastinal enlarged lymph node measuring up to 1.2 cm short axis (series 3, image 1), likely reactive. Reproductive: Prior hysterectomy. Other: Loss of body fat in comparison prior exam in 2017. No hernia. No significant ascites. No free air. Musculoskeletal: Osteopenia. No acute osseous abnormality. Unchanged lumbosacral fusion hardware without evidence of complication. Multilevel degenerative changes of the spine. Unchanged spinal alignment. IMPRESSION: Right lower lobe pneumonia with additional tree-in-bud opacities in the middle lobe, lingula, and left lower lobe,  potentially aspiration-related. Large hiatal hernia with intrathoracic stomach. Mild-to-moderate stool burden suggesting constipation. Marked distension of the urinary bladder. Electronically Signed   By: Maurine Simmering M.D.   On: 10/24/2021 14:24   DG Chest 2 View  Result Date: 10/24/2021 CLINICAL DATA:  Chest pain, nausea and abdominal pain. EXAM: CHEST - 2 VIEW COMPARISON:  01/09/2021 FINDINGS: Large hiatal hernia is again noted. Stable cardiomediastinal contours. No pleural effusion or edema. Chronic asymmetric elevation of the right hemidiaphragm. Mild atelectasis noted within the right base. No airspace consolidation identified. Spondylosis noted within the lower thoracic spine. Capsular calcification of bilateral breast implants noted IMPRESSION: Chronic asymmetric elevation of the right hemidiaphragm with right base atelectasis. Large hiatal hernia. Aortic Atherosclerosis (ICD10-I70.0). Electronically Signed   By: Kerby Moors M.D.   On: 10/24/2021 11:25    Procedures Ultrasound ED Peripheral IV (Provider)  Date/Time: 10/24/2021 5:47 PM  Performed by: Regan Lemming, MD Authorized by: Regan Lemming, MD   Procedure details:    Indications: multiple failed IV attempts     Location:  Left AC   Angiocath:  20 G   Bedside Ultrasound Guided: Yes     Images: not archived     Patient tolerated procedure without complications: Yes     Dressing applied: Yes       Medications Ordered in ED Medications  morphine (MS CONTIN) 12 hr tablet 100 mg (has no administration in time range)  metoprolol tartrate (LOPRESSOR) tablet 50 mg (has no administration in time range)  lactated ringers bolus 1,000 mL (0 mLs Intravenous Stopped 10/24/21 1355)  ondansetron (ZOFRAN) injection 4 mg (4 mg Intravenous Given 10/24/21 1257)  fentaNYL (SUBLIMAZE) injection 25 mcg (25 mcg Intravenous Given 10/24/21 1302)  iohexol (OMNIPAQUE) 300 MG/ML solution 60 mL (60 mLs Intravenous Contrast Given 10/24/21 1355)  Glycerin  (Adult) 2 g suppository 1 suppository (1 suppository Rectal Given 10/24/21 1604)  sodium phosphate (FLEET) 7-19 GM/118ML enema 1 enema (1 enema Rectal Given 10/24/21 1601)  Ampicillin-Sulbactam (UNASYN) 3 g in sodium chloride 0.9 % 100 mL IVPB (0 g Intravenous Stopped 10/24/21 1715)    ED Course/ Medical Decision Making/ A&P                           Medical Decision Making Amount and/or Complexity of Data Reviewed Labs: ordered. Radiology: ordered.  Risk OTC drugs. Prescription drug management. Decision regarding hospitalization.   88 year old female with medical history significant for arthritis, GERD, history of hiatal hernia, anemia, history of esophagitis, aspiration pneumonia, presenting to the emergency department from [facility] with nausea, vomiting, weakness.  EMS found the patient with blood pressure in the 70s and oxygen saturations  in the 80s with subsequent improvement on 2 L O2.  The patient endorsed chills.  She denied any cough or fever.  She endorsed generalized abdominal discomfort.  Her last bowel movement was yesterday and was normal and she states that she is passing gas.  She continues to endorse generalized abdominal discomfort with nausea and NBNB emesis.  Denies any coffee-ground emesis or hematemesis.  Denies any melena or hematochezia.  On arrival, the patient was afebrile, not tachycardic or tachypneic, normotensive, saturating 86% on room air, subsequently improved to 2 L O2 via nasal cannula to 96%.  Sinus rhythm noted on cardiac telemetry.  Physical exam significant for generalized abdominal tenderness to palpation.  Differential diagnosis includes small bowel obstruction, constipation, appendicitis, pyelonephritis, UTI, diverticulitis, cholecystitis, pancreatitis.  Laboratory work-up interpreted myself significant for COVID-19 influenza PCR testing negative, CBC with leukocytosis to 14.7, no anemia, urinalysis with trace leukocytes, rare bacteria, unconvincing  for UTI, lactic acid normal, CMP with evidence of an AKI with a creatinine of 1.34 from baseline of 0.87.  Mildly elevated BUN to 28.  A chest x-ray was performed and revealed a large hiatal hernia.  A CT abdomen pelvis was performed and results are as follows: IMPRESSION:  Right lower lobe pneumonia with additional tree-in-bud opacities in  the middle lobe, lingula, and left lower lobe, potentially  aspiration-related.    Large hiatal hernia with intrathoracic stomach.    Mild-to-moderate stool burden suggesting constipation.    Marked distension of the urinary bladder.    Given the patient's new hypoxic respiratory failure, leukocytosis, findings of the right lower lobe pneumonia, evidence of aspiration bilaterally, the patient was started on Unasyn for antibiotics.  Rectal exam revealed no evidence of fecal impaction.  Concern for constipation given the patient's CT findings with no other acute findings noted.  Patient was administered a glycerin suppository and fleets enema.  Given the patient's evidence of aspiration pneumonia with hypoxic respiratory failure, medicine was consulted for admission.  Prior to admission, the patient developed new onset A-fib with RVR and worsening hypoxia.  Concern for developing PE.  CTA PE study ordered.  Dr. Rogers Blocker of hospitalist medicine was consulted for admission and the patient was subsequently admitted in stable condition.   Final Clinical Impression(s) / ED Diagnoses Final diagnoses:  AKI (acute kidney injury) ([place])  Constipation, unspecified constipation type  Acute respiratory failure with hypoxia ([place])  Community acquired pneumonia, unspecified laterality  Atrial fibrillation with RVR ([place])    Rx / DC Orders ED Discharge Orders     None         Regan Lemming, MD 10/24/21 1747

## 2021-10-24 NOTE — H&P (Signed)
History and Physical    Patient: [name]:[id number] DOB: 1932/07/10 DOA: 10/24/2021 DOS: the patient was seen and examined on 10/24/2021 PCP: Alroy Dust, L.Marlou Sa, MD  Patient coming from: SN[facility]. WC bound    Chief Complaint: abdominal pain and N/V   HPI: [name] is a 88 y.o. female with medical history significant of anemia, GERD, HTN, constipation, sinus tachycardia who presented to ED with complaints of N/V and stomach pain. She states her pain started yesterday. [name] has only had one episode of vomiting, but has been nauseated. Pain in middle of her stomach. Pain was 9/10 and didn't radiate. Pain described as sharp and worse with eating or drinking. Pain is better now, rated as a 6/10. She denies any chest pain or palpitations. She is not short of breath and has had no coughing. No fever/chills. She was found to be hypoxic on room air at 86%.   She does not smoke or drink alcohol.   Denies any fever/chills, vision changes/headaches, chest pain or palpitations, shortness of breath or cough,no diarrhea,  dysuria or leg swelling.     ER Course:  vitals: afebrile, bp: 114/71, HR; 94, RR: 14, oxygen: 86%RA>96% on 2L [place] Pertinent labs: wbc: 14.7, BUN: 28, creatinine: 1.34, covid/flu negative.  CXR: chronic asymmetric elevation of right hemidiaphragm with right base atelectasis. Large hiatal hernia  Ct abdo/pelvis: right LL pneumonia with additional tree-in -bud opacities in middle lobe, lingula and left lower lobe potentially aspiration related.  Markedly distended urinary bladder. Constipation and large hiatal hernia with intrathoracic stomach.  In ED: started on unasyn given 1L LR bolus, fleet enema and glycerin suppository. TRH asked to admit.     Review of Systems: As mentioned in the history of present illness. All other systems reviewed and are negative. Past Medical History:  Diagnosis Date   Anemia 10-05-12   hgb-9.0 on 09-14-12   Arm vein blood clot  10-05-12   Rt. arm '12   Arthritis 10-05-12   degenerative joint disease,scoliosis spine   Aspiration pneumonia ([place])    Dysrhythmia 10-05-12   tachycardia -tx. Lopressor   GERD (gastroesophageal reflux disease)    H/O esophagitis    H/O hiatal hernia    Tachycardia    Transfusion history 10-05-12   4 units blood '12   Past Surgical History:  Procedure Laterality Date   ABDOMINAL HYSTERECTOMY     APPENDECTOMY     child   BACK SURGERY  10-05-12   x5-last fusion with plates   BREAST ENHANCEMENT SURGERY     CATARACT EXTRACTION, BILATERAL     COLONOSCOPY WITH PROPOFOL N/A 10/23/2012   Procedure: COLONOSCOPY WITH PROPOFOL;  Surgeon: Lear Ng, MD;  Location: [facility];  Service: Endoscopy;  Laterality: N/A;   HOT HEMOSTASIS N/A 10/23/2012   Procedure: HOT HEMOSTASIS (ARGON PLASMA COAGULATION/BICAP);  Surgeon: Lear Ng, MD;  Location: [facility];  Service: Endoscopy;  Laterality: N/A;   Social History:  reports that she has never smoked. She has never used smokeless tobacco. She reports that she does not drink alcohol and does not use drugs.  Allergies  Allergen Reactions   Ciprofloxacin Rash    burning    Family History  Problem Relation Age of Onset   Cancer Mother    Cancer Father    Diabetes Sister    Stroke Neg Hx    CAD Neg Hx     Prior to Admission medications   Medication Sig Start Date End  Date Taking? Authorizing Provider  acetaminophen (TYLENOL) 325 MG tablet Take 650 mg by mouth every 4 (four) hours as needed for fever.    [provider]  Amino Acids-Protein Hydrolys (FEEDING SUPPLEMENT, PRO-STAT SUGAR FREE 64,) LIQD Take 30 mLs by mouth 2 (two) times daily.    [provider]  aspirin 81 MG tablet Take 81 mg by mouth at bedtime.     [provider]  ipratropium-albuterol (DUONEB) 0.5-2.5 (3) MG/3ML SOLN Take 3 mLs by nebulization every 4 (four) hours as needed (shortness of breath/wheezing).    [provider]  metoprolol (LOPRESSOR) 50 MG tablet Take 50 mg by mouth 2 (two) times daily.    [provider]  morphine (MS CONTIN) 100 MG 12 hr tablet Take 100 mg by mouth every 12 (twelve) hours.    [provider]  Multiple Vitamin (MULTIVITAMIN WITH MINERALS) TABS tablet Take 1 tablet by mouth daily.    [provider]  Nutritional Supplements (NUTRITIONAL SHAKE PO) House Shakes (aka Mighty shake) 4 ounces with all meals    [provider]  Nutritional Supplements (NUTRITIONAL SUPPLEMENT PO) Take by mouth. House 2.0, 237cc two times daiy    [provider]  OXYGEN Inhale 2 L/min into the lungs. Via nasal cannula    [provider]  pantoprazole (PROTONIX) 40 MG tablet Take 40 mg by mouth 2 (two) times daily.    [provider]  polyethylene glycol (MIRALAX / GLYCOLAX) packet Take 17 g by mouth 2 (two) times daily. 03/26/16   Charlynne Cousins, MD  promethazine (PHENERGAN) 25 MG tablet Take 25 mg by mouth every 4 (four) hours as needed for nausea or vomiting.    [provider]    Physical Exam: Vitals:   10/24/21 1745 10/24/21 1800 10/24/21 1830 10/24/21 1933  BP: 124/64 125/66 (!) 114/59 119/65  Pulse: 90 89 88 93  Resp: (!) '25 20 17 17  '$ Temp:      SpO2: 96% 93% 96% 95%   General:  Appears calm and comfortable and is in NAD. Thin  Eyes:  PERRL, EOMI, normal lids, iris ENT:  grossly normal hearing, lips & tongue, mmm; appropriate dentition Neck:  no LAD, masses or thyromegaly; no carotid bruits Cardiovascular:  irregularly, irregular, no m/r/g. No LE edema.  Respiratory:   RLL course breath sounds and crackles.  Normal respiratory effort. Abdomen:  soft, generalized TTP, no rebound or guarding.  ND, NABS Back:   normal alignment, no CVAT Skin:  no rash or induration seen on limited exam Musculoskeletal:  grossly normal tone BUE/BLE, good ROM, no bony abnormality Lower extremity:  No LE edema.  Limited foot exam with no  ulcerations.  2+ distal pulses. Psychiatric:  grossly normal mood and affect, speech fluent and appropriate, AOx3 Neurologic:  CN 2-12 grossly intact, moves all extremities in coordinated fashion, sensation intact   Radiological Exams on Admission: Independently reviewed - see discussion in A/P where applicable  CT ABDOMEN PELVIS W CONTRAST  Result Date: 10/24/2021 CLINICAL DATA:  Nausea/vomiting Abdominal pain, acute, nonlocalized EXAM: CT ABDOMEN AND PELVIS WITH CONTRAST TECHNIQUE: Multidetector CT imaging of the abdomen and pelvis was performed using the standard protocol following bolus administration of intravenous contrast. RADIATION DOSE REDUCTION: This exam was performed according to the departmental dose-optimization program which includes automated exposure control, adjustment of the mA and/or kV according to patient size and/or use of iterative reconstruction technique. CONTRAST:  42m OMNIPAQUE IOHEXOL 300 MG/ML  SOLN COMPARISON:  CT abdomen pelvis 03/28/2016 FINDINGS: Lower chest: Elevated right hemidiaphragm. Right basilar consolidation. Additional tree-in-bud opacities in the right middle lobe, lingula, and left lower lobe. Mild bronchiectasis. Right middle lobe atelectasis. Bilateral breast implants with calcified capsules. Hepatobiliary: Unchanged hepatic cysts. The gallbladder is unremarkable. Pancreas: Unremarkable. No pancreatic ductal dilatation or surrounding inflammatory changes. Spleen: Unchanged lobulation of the spleen with areas of scarring and evidence of of prior infarcts. No new splenic abnormality. Adrenals/Urinary Tract: Adrenal glands are unremarkable. No hydronephrosis or nephrolithiasis. Unchanged bilateral renal cyst. No follow-up imaging is recommended. There is marked distension of the urinary bladder. Stomach/Bowel: Large hiatal hernia with intrathoracic stomach. There is no evidence of bowel obstruction.Prior appendectomy. Mild-to-moderate stool burden. Scattered  colonic diverticula. Vascular/Lymphatic: Aortoiliac atherosclerosis. There is a posterior mediastinal enlarged lymph node measuring up to 1.2 cm short axis (series 3, image 1), likely reactive. Reproductive: Prior hysterectomy. Other: Loss of body fat in comparison prior exam in 2017. No hernia. No significant ascites. No free air. Musculoskeletal: Osteopenia. No acute osseous abnormality. Unchanged lumbosacral fusion hardware without evidence of complication. Multilevel degenerative changes of the spine. Unchanged spinal alignment. IMPRESSION: Right lower lobe pneumonia with additional tree-in-bud opacities in the middle lobe, lingula, and left lower lobe, potentially aspiration-related. Large hiatal hernia with intrathoracic stomach. Mild-to-moderate stool burden suggesting constipation. Marked distension of the urinary bladder. Electronically Signed   By: Maurine Simmering M.D.   On: 10/24/2021 14:24   DG Chest 2 View  Result Date: 10/24/2021 CLINICAL DATA:  Chest pain, nausea and abdominal pain. EXAM: CHEST - 2 VIEW COMPARISON:  01/09/2021 FINDINGS: Large hiatal hernia is again noted. Stable cardiomediastinal contours. No pleural effusion or edema. Chronic asymmetric elevation of the right hemidiaphragm. Mild atelectasis noted within the right base. No airspace consolidation identified. Spondylosis noted within the lower thoracic spine. Capsular calcification of bilateral breast implants noted IMPRESSION: Chronic asymmetric elevation of the right hemidiaphragm with right base atelectasis. Large hiatal hernia. Aortic Atherosclerosis (ICD10-I70.0). Electronically Signed   By: Kerby Moors M.D.   On: 10/24/2021 11:25    EKG: Independently reviewed.  NSR with rate 97; nonspecific ST changes with no evidence of acute ischemia   Labs on Admission: I have personally reviewed the available labs and imaging studies at the time of the admission.  Pertinent labs:   wbc: 14.7,  BUN: 28,  creatinine: 1.34,   covid/flu negative.   Assessment and Plan: Principal Problem:   Acute respiratory failure with hypoxia (HCC) Active Problems:   Paroxysmal atrial fibrillation (HCC)   Sepsis likely from pneumonia/parainfluenza    AKI (acute kidney injury) ([place])   Abdominal pain   Sinus tachycardia   Chronic pain syndrome   GERD (gastroesophageal reflux disease)    Assessment and Plan: * Acute respiratory failure with hypoxia ([place]) 88 year old female presenting with abdominal pain found to be hypoxic on room air to 86% and requiring 4-5L via [place] to maintain oxygen >92%.  -admit to progressive -possible pneumonia on CT abdomen and possible aspiration. She has no hx of suspected aspiration, but had one episode of vomiting. Received Unasyn in ED, will broaden to cover for CAP with rocephin and azithromycin -elevated d-dimer in setting of acute respiratory failure with hypoxia: checking CTA for PE, started on heparin with paroxysmal atrial fibrillation  -BNP pending, but appears euvolemic on exam with no hx of CHF; however, with possible new PAF may be elevated. Echo pending -RVP +for parainfluenza virus 3. She has history in 2018 of pneumonia requiring  5L of oxygen at home  -has large hiatal hernia that could also be contributing to a restrictive type picture  -wean as tolerated   Paroxysmal atrial fibrillation ([place]) New finding in setting of possible pnumonia. Going into afib then sinus tachy Keep on telemetry overnight  Rate has been around 100-110 Will give her lopressor now, continue '50mg'$  BID CHA2Ds2-vasc of 3, starting heparin gtt with plan to transition to oral DOAC Echo  Check TSH and mag    Sepsis likely from pneumonia/parainfluenza  Meets sepsis criteria with elevated WBC, elevated heart rate and pneumonia on CT imaging. Also + for parainfluenza -received 1L IVF in Ed. Holding IVF with new onset PAF, blood pressure stable.  -unasyn given in ED, changing to rocephin and  azithromycin -lactic acid wnl, PCT pending. -trend cbc  -she is not short of breath or symptomatic -check urinary antigens  -SLP eval -aspiration precautions   AKI (acute kidney injury) ([place]) Baseline creatinine is normal  1.34 today in setting of acute respiratory failure and sepsis and PAF  Given 1L IVF in ED Hold more IVF at this time UA pending Strict I/O Bladder scan as bladder distended on CT  Avoid nephrotoxic drugs  Hold lasix for now  Trend   Abdominal pain CT abdomen/pelvis with no acute abdominal findings except constipation with moderate stool burden. Given enema in ED and has miralax prn  abdominal pain improved Has large hiatal hernia which could be contributing Continue to monitor   Sinus tachycardia Continue lopressor. Appears to be going in and out of sinus tachycardia and PAF See above   Chronic pain syndrome Patient on MS contin '100mg'$  BID. Confirmed on her SNF medication  Will continue this for now, no additional pain meds   GERD (gastroesophageal reflux disease) Continue PPI and carafate      Advance Care Planning:   Code Status: DNR   Consults: speech   DVT Prophylaxis: heparin gtt   Family Communication: attempted to contact son, but up in [place] and goes to [facility]  Severity of Illness: The appropriate patient status for this patient is OBSERVATION. Observation status is judged to be reasonable and necessary in order to provide the required intensity of service to ensure the patient's safety. The patient's presenting symptoms, physical exam findings, and initial radiographic and laboratory data in the context of their medical condition is felt to place them at decreased risk for further clinical deterioration. Furthermore, it is anticipated that the patient will be medically stable for discharge from the hospital within 2 midnights of admission.   Author: Orma Flaming, MD 10/24/2021 8:14 PM  For on call review [email or web address].

## 2021-10-24 NOTE — ED Notes (Signed)
Admitting paged regarding pt's BP trending down. Pt alert and oriented x4 with no increase in weakness.

## 2021-10-24 NOTE — Assessment & Plan Note (Addendum)
New finding in setting of possible pnumonia. Going into afib then sinus tachy Keep on telemetry overnight  Rate has been around 100-110 Will give her lopressor now, continue '50mg'$  BID CHA2Ds2-vasc of 3, starting heparin gtt with plan to transition to oral DOAC Echo  Check TSH and mag

## 2021-10-24 NOTE — Assessment & Plan Note (Addendum)
Meets sepsis criteria with elevated WBC, elevated heart rate and pneumonia on CT imaging. Also + for parainfluenza -received 1L IVF in Ed. Holding IVF with new onset PAF, blood pressure stable.  -unasyn given in ED, changing to rocephin and azithromycin -lactic acid wnl, PCT pending. -trend cbc  -she is not short of breath or symptomatic -check urinary antigens  -SLP eval -aspiration precautions

## 2021-10-24 NOTE — Progress Notes (Signed)
Arived to room, RN reports PIV was started by physician. Cancel consult.

## 2021-10-24 NOTE — Assessment & Plan Note (Signed)
Continue lopressor. Appears to be going in and out of sinus tachycardia and PAF See above

## 2021-10-24 NOTE — ED Notes (Signed)
Contact [name] (son) for updates. They are out of town requesting to use this contact number,   [phone]

## 2021-10-24 NOTE — ED Notes (Signed)
Pts IV blew in CT. Pt states she wants a PICC line

## 2021-10-24 NOTE — Assessment & Plan Note (Signed)
Continue PPI and carafate

## 2021-10-24 NOTE — Assessment & Plan Note (Addendum)
Baseline creatinine is normal  1.34 today in setting of acute respiratory failure and sepsis and PAF  Given 1L IVF in ED Hold more IVF at this time UA pending Strict I/O Bladder scan as bladder distended on CT  Avoid nephrotoxic drugs  Hold lasix for now  Trend

## 2021-10-25 ENCOUNTER — Inpatient Hospital Stay (HOSPITAL_COMMUNITY): Payer: Medicare (Managed Care)

## 2021-10-25 DIAGNOSIS — M1990 Unspecified osteoarthritis, unspecified site: Secondary | ICD-10-CM | POA: Diagnosis present

## 2021-10-25 DIAGNOSIS — R000 Tachycardia, unspecified: Secondary | ICD-10-CM | POA: Diagnosis not present

## 2021-10-25 DIAGNOSIS — I10 Essential (primary) hypertension: Secondary | ICD-10-CM | POA: Diagnosis not present

## 2021-10-25 DIAGNOSIS — N3289 Other specified disorders of bladder: Secondary | ICD-10-CM | POA: Diagnosis present

## 2021-10-25 DIAGNOSIS — J9601 Acute respiratory failure with hypoxia: Secondary | ICD-10-CM | POA: Diagnosis not present

## 2021-10-25 DIAGNOSIS — Z79899 Other long term (current) drug therapy: Secondary | ICD-10-CM | POA: Diagnosis not present

## 2021-10-25 DIAGNOSIS — K449 Diaphragmatic hernia without obstruction or gangrene: Secondary | ICD-10-CM | POA: Diagnosis present

## 2021-10-25 DIAGNOSIS — I491 Atrial premature depolarization: Secondary | ICD-10-CM | POA: Diagnosis not present

## 2021-10-25 DIAGNOSIS — E876 Hypokalemia: Secondary | ICD-10-CM | POA: Diagnosis not present

## 2021-10-25 DIAGNOSIS — Z66 Do not resuscitate: Secondary | ICD-10-CM | POA: Diagnosis not present

## 2021-10-25 DIAGNOSIS — Z881 Allergy status to other antibiotic agents status: Secondary | ICD-10-CM | POA: Diagnosis not present

## 2021-10-25 DIAGNOSIS — J189 Pneumonia, unspecified organism: Secondary | ICD-10-CM

## 2021-10-25 DIAGNOSIS — Z90710 Acquired absence of both cervix and uterus: Secondary | ICD-10-CM | POA: Diagnosis not present

## 2021-10-25 DIAGNOSIS — G894 Chronic pain syndrome: Secondary | ICD-10-CM | POA: Diagnosis not present

## 2021-10-25 DIAGNOSIS — Z79891 Long term (current) use of opiate analgesic: Secondary | ICD-10-CM | POA: Diagnosis not present

## 2021-10-25 DIAGNOSIS — N179 Acute kidney failure, unspecified: Secondary | ICD-10-CM | POA: Diagnosis not present

## 2021-10-25 DIAGNOSIS — K5900 Constipation, unspecified: Secondary | ICD-10-CM | POA: Diagnosis present

## 2021-10-25 DIAGNOSIS — A4189 Other specified sepsis: Secondary | ICD-10-CM | POA: Diagnosis not present

## 2021-10-25 DIAGNOSIS — I2510 Atherosclerotic heart disease of native coronary artery without angina pectoris: Secondary | ICD-10-CM | POA: Diagnosis present

## 2021-10-25 DIAGNOSIS — K219 Gastro-esophageal reflux disease without esophagitis: Secondary | ICD-10-CM | POA: Diagnosis not present

## 2021-10-25 DIAGNOSIS — I4891 Unspecified atrial fibrillation: Secondary | ICD-10-CM | POA: Diagnosis present

## 2021-10-25 DIAGNOSIS — J129 Viral pneumonia, unspecified: Secondary | ICD-10-CM | POA: Diagnosis not present

## 2021-10-25 DIAGNOSIS — Z7982 Long term (current) use of aspirin: Secondary | ICD-10-CM | POA: Diagnosis not present

## 2021-10-25 DIAGNOSIS — D649 Anemia, unspecified: Secondary | ICD-10-CM | POA: Diagnosis not present

## 2021-10-25 DIAGNOSIS — J204 Acute bronchitis due to parainfluenza virus: Secondary | ICD-10-CM | POA: Diagnosis not present

## 2021-10-25 DIAGNOSIS — Z20822 Contact with and (suspected) exposure to covid-19: Secondary | ICD-10-CM | POA: Diagnosis not present

## 2021-10-25 DIAGNOSIS — I4892 Unspecified atrial flutter: Secondary | ICD-10-CM | POA: Diagnosis not present

## 2021-10-25 LAB — BASIC METABOLIC PANEL
Anion gap: 16 — ABNORMAL HIGH (ref 5–15)
BUN: 25 mg/dL — ABNORMAL HIGH (ref 8–23)
CO2: 23 mmol/L (ref 22–32)
Calcium: 8.4 mg/dL — ABNORMAL LOW (ref 8.9–10.3)
Chloride: 100 mmol/L (ref 98–111)
Creatinine, Ser: 1.08 mg/dL — ABNORMAL HIGH (ref 0.44–1.00)
GFR, Estimated: 50 mL/min — ABNORMAL LOW (ref >60)
Glucose, Bld: 100 mg/dL — ABNORMAL HIGH (ref 70–99)
Potassium: 3.3 mmol/L — ABNORMAL LOW (ref 3.5–5.1)
Sodium: 139 mmol/L (ref 135–145)

## 2021-10-25 LAB — MRSA NEXT GEN BY PCR, NASAL: MRSA by PCR Next Gen: DETECTED — AB

## 2021-10-25 LAB — C DIFFICILE QUICK SCREEN W PCR REFLEX
C Diff antigen: NEGATIVE
C Diff interpretation: NOT DETECTED
C Diff toxin: NEGATIVE

## 2021-10-25 LAB — HEPARIN LEVEL (UNFRACTIONATED): Heparin Unfractionated: <0.1 IU/mL — ABNORMAL LOW (ref 0.30–0.70)

## 2021-10-25 LAB — ECHOCARDIOGRAM COMPLETE: Area-P 1/2: 6.22 cm2

## 2021-10-25 MED ORDER — POTASSIUM CHLORIDE 20 MEQ PO PACK
60.0000 meq | PACK | Freq: Once | ORAL | Status: AC
Start: 2021-10-25 — End: 2021-10-25
  Administered 2021-10-25: 60 meq via ORAL
  Filled 2021-10-25: qty 3

## 2021-10-25 MED ORDER — METOPROLOL TARTRATE 50 MG PO TABS
50.0000 mg | ORAL_TABLET | Freq: Two times a day (BID) | ORAL | Status: DC
Start: 2021-10-25 — End: 2021-10-29
  Administered 2021-10-25 – 2021-10-29 (×9): 50 mg via ORAL
  Filled 2021-10-25 (×6): qty 1
  Filled 2021-10-25: qty 2
  Filled 2021-10-25 (×2): qty 1

## 2021-10-25 MED ORDER — MUPIROCIN 2 % EX OINT
1.0000 | TOPICAL_OINTMENT | Freq: Two times a day (BID) | CUTANEOUS | Status: DC
  Administered 2021-10-26 – 2021-10-29 (×9): 1 via NASAL
  Filled 2021-10-25 (×2): qty 22

## 2021-10-25 MED ORDER — METOCLOPRAMIDE HCL 5 MG/ML IJ SOLN: 10.0000 mg | Freq: Four times a day (QID) | INTRAMUSCULAR | Status: DC | PRN

## 2021-10-25 MED ORDER — ORAL CARE MOUTH RINSE: 15.0000 mL | OROMUCOSAL | Status: DC | PRN

## 2021-10-25 MED ORDER — HEPARIN BOLUS VIA INFUSION
1000.0000 [IU] | Freq: Once | INTRAVENOUS | Status: AC
  Administered 2021-10-25: 1000 [IU] via INTRAVENOUS
  Filled 2021-10-25: qty 1000

## 2021-10-25 MED ORDER — CHLORHEXIDINE GLUCONATE CLOTH 2 % EX PADS
6.0000 | MEDICATED_PAD | Freq: Every day | CUTANEOUS | Status: AC
  Administered 2021-10-26 – 2021-10-30 (×5): 6 via TOPICAL

## 2021-10-25 MED ORDER — FUROSEMIDE 20 MG PO TABS: 20.0000 mg | ORAL_TABLET | Freq: Every day | ORAL | Status: DC

## 2021-10-25 MED ORDER — ENOXAPARIN SODIUM 40 MG/0.4ML IJ SOSY
40.0000 mg | PREFILLED_SYRINGE | INTRAMUSCULAR | Status: DC
  Administered 2021-10-25 – 2021-10-30 (×6): 40 mg via SUBCUTANEOUS
  Filled 2021-10-25 (×6): qty 0.4

## 2021-10-25 NOTE — ED Notes (Signed)
Cardiology at bedside.

## 2021-10-25 NOTE — ED Notes (Signed)
ED TO INPATIENT HANDOFF REPORT  ED Nurse Name and Phone #: [phone]  S Name/Age/Gender [name] 88 y.o. female Room/Bed: 046C/046C  Code Status   Code Status: DNR  Home/SNF/Other Home Patient oriented to: self, place, time, and situation Is this baseline? Yes   Triage Complete: Triage complete  Chief Complaint Acute respiratory failure with hypoxia ([place]) [J96.01] Atrial fibrillation with rapid ventricular response [facility]) [I48.91]  Triage Note Patient arrived from [facility] and complains of abdominal pain with nausea and vomiting and weakness. Ems found patient with BP 70s and oxygen in 80s, arrived on 2l. Chills with same   Allergies Allergies  Allergen Reactions   Ciprofloxacin Rash    burning    Level of Care/Admitting Diagnosis ED Disposition     ED Disposition  Admit   Condition  --   [place]: [facility] [place] [[id number]]  Level of Care: Progressive [102]  Admit to Progressive based on following criteria: CARDIOVASCULAR & THORACIC of moderate stability with acute coronary syndrome symptoms/low risk myocardial infarction/hypertensive urgency/arrhythmias/heart failure potentially compromising stability and stable post cardiovascular intervention patients.  May admit patient to [facility] or [facility] if equivalent level of care is available:: No  Covid Evaluation: Confirmed COVID Negative  Diagnosis: Atrial fibrillation with rapid ventricular response [facility]) [[id number]]  Admitting Physician: Eston Esters  Attending Physician: WOUK, NOAH BEDFORD [[id number]]  Certification:: I certify this patient will need inpatient services for at least 2 midnights  Estimated Length of Stay: 4          B Medical/Surgery History Past Medical History:  Diagnosis Date   Anemia 10-05-12   hgb-9.0 on 09-14-12   Arm vein blood clot 10-05-12   Rt. arm '12   Arthritis 10-05-12   degenerative joint disease,scoliosis spine   Aspiration  pneumonia ([place])    Dysrhythmia 10-05-12   tachycardia -tx. Lopressor   GERD (gastroesophageal reflux disease)    H/O esophagitis    H/O hiatal hernia    Tachycardia    Transfusion history 10-05-12   4 units blood '12   Past Surgical History:  Procedure Laterality Date   ABDOMINAL HYSTERECTOMY     APPENDECTOMY     child   BACK SURGERY  10-05-12   x5-last fusion with plates   BREAST ENHANCEMENT SURGERY     CATARACT EXTRACTION, BILATERAL     COLONOSCOPY WITH PROPOFOL N/A 10/23/2012   Procedure: COLONOSCOPY WITH PROPOFOL;  Surgeon: Lear Ng, MD;  Location: [facility] END[facility]COPY;  Service: Endoscopy;  Laterality: N/A;   HOT HEMOSTASIS N/A 10/23/2012   Procedure: HOT HEMOSTASIS (ARGON PLASMA COAGULATION/BICAP);  Surgeon: Lear Ng, MD;  Location: [facility];  Service: Endoscopy;  Laterality: N/A;     A IV Location/Drains/Wounds Patient Lines/Drains/Airways Status     Active Line/Drains/Airways     Name Placement date Placement time Site Days   Peripheral IV 10/24/21 24 G Left;Posterior Forearm 10/24/21  1420  Forearm  1   Peripheral IV 10/24/21 20 G 1" Right Antecubital 10/24/21  2027  Antecubital  1            Intake/Output Last 24 hours  Intake/Output Summary (Last 24 hours) at 10/25/2021 1442 Last data filed at 10/24/2021 1715 Gross per 24 hour  Intake 100.17 ml  Output --  Net 100.17 ml    Labs/Imaging Results for orders placed or performed during the hospital encounter of 10/24/21 (from the past 48 hour(s))  Lipase, blood  Status: None   Collection Time: 10/24/21 10:44 AM  Result Value Ref Range   Lipase 19 11 - 51 U/L    Comment: Performed at [facility], [place] [address] [place], [place] [address]  Comprehensive metabolic panel     Status: Abnormal   Collection Time: 10/24/21 10:44 AM  Result Value Ref Range   Sodium 137 135 - 145 mmol/L   Potassium 4.0 3.5 - 5.1 mmol/L   Chloride 95 (L) 98 - 111 mmol/L   CO2 25 22 - 32 mmol/L   Glucose,  Bld 121 (H) 70 - 99 mg/dL    Comment: Glucose reference range applies only to samples taken after fasting for at least 8 hours.   BUN 28 (H) 8 - 23 mg/dL   Creatinine, Ser 1.34 (H) 0.44 - 1.00 mg/dL   Calcium 9.4 8.9 - 10.3 mg/dL   Total Protein 7.1 6.5 - 8.1 g/dL   Albumin 3.4 (L) 3.5 - 5.0 g/dL   AST 21 15 - 41 U/L   ALT 9 0 - 44 U/L   Alkaline Phosphatase 48 38 - 126 U/L   Total Bilirubin 0.7 0.3 - 1.2 mg/dL   GFR, Estimated 38 (L) >60 mL/min    Comment: (NOTE) Calculated using the CKD-EPI Creatinine Equation (2021)    Anion gap 17 (H) 5 - 15    Comment: Performed at [facility], [place] [address], [place], [place] [address]  CBC     Status: Abnormal   Collection Time: 10/24/21 10:44 AM  Result Value Ref Range   WBC 14.7 (H) 4.0 - 10.5 K/uL   RBC 4.18 3.87 - 5.11 MIL/uL   Hemoglobin 12.8 12.0 - 15.0 g/dL   HCT 40.6 36.0 - 46.0 %   MCV 97.1 80.0 - 100.0 fL   MCH 30.6 26.0 - 34.0 pg   MCHC 31.5 30.0 - 36.0 g/dL   RDW 14.6 11.5 - 15.5 %   Platelets 358 150 - 400 K/uL   nRBC 0.0 0.0 - 0.2 %    Comment: Performed at [facility], [place] [address] [place], [place] [address]  Urinalysis, Routine w reflex microscopic Urine, Clean Catch     Status: Abnormal   Collection Time: 10/24/21 10:44 AM  Result Value Ref Range   Color, Urine YELLOW YELLOW   APPearance CLEAR CLEAR   Specific Gravity, Urine 1.014 1.005 - 1.030   pH 5.0 5.0 - 8.0   Glucose, UA NEGATIVE NEGATIVE mg/dL   Hgb urine dipstick NEGATIVE NEGATIVE   Bilirubin Urine NEGATIVE NEGATIVE   Ketones, ur 5 (A) NEGATIVE mg/dL   Protein, ur NEGATIVE NEGATIVE mg/dL   Nitrite NEGATIVE NEGATIVE   Leukocytes,Ua TRACE (A) NEGATIVE   RBC / HPF 0-5 0 - 5 RBC/hpf   WBC, UA 6-10 0 - 5 WBC/hpf   Bacteria, UA RARE (A) NONE SEEN   Squamous Epithelial / LPF 0-5 0 - 5   Hyaline Casts, UA PRESENT     Comment: Performed at [facility], [address], [place], [place] [address]  Strep pneumoniae urinary antigen      Status: None   Collection Time: 10/24/21 10:44 AM  Result Value Ref Range   Strep Pneumo Urinary Antigen NEGATIVE NEGATIVE    Comment:        Infection due to S. pneumoniae cannot be absolutely ruled out since the antigen present may be below the detection limit of the test. Performed at [facility], [address] [place], [place] [address]   Resp Panel by RT-PCR (Flu A&B, Covid) Anterior Nasal Swab     Status: None   Collection Time: 10/24/21 12:54 PM   Specimen: Anterior Nasal Swab  Result Value Ref Range   SARS Coronavirus 2 by RT PCR NEGATIVE NEGATIVE    Comment: (NOTE) SARS-CoV-2 target nucleic acids are NOT DETECTED.  The SARS-CoV-2 RNA is generally detectable in upper respiratory specimens during the acute phase of infection. The lowest concentration of SARS-CoV-2 viral copies this assay can detect is 138 copies/mL. A negative result does not preclude SARS-Cov-2 infection and should not be used as the sole basis for treatment or other patient management decisions. A negative result may occur with  improper specimen collection/handling, submission of specimen other than nasopharyngeal swab, presence of viral mutation(s) within the areas targeted by this assay, and inadequate number of viral copies(<138 copies/mL). A negative result must be combined with clinical observations, patient history, and epidemiological information. The expected result is Negative.  Fact Sheet for Patients:  EntrepreneurPulse.com.au  Fact Sheet for Healthcare Providers:  IncredibleEmployment.be  This test is no t yet approved or cleared by the [place] FDA and  has been authorized for detection and/or diagnosis of SARS-CoV-2 by FDA under an Emergency Use Authorization (EUA). This EUA will remain  in effect (meaning this test can be used) for the duration of the COVID-19 declaration under Section 564(b)(1) of the Act, 21 U.S.C.section  360bbb-3(b)(1), unless the authorization is terminated  or revoked sooner.       Influenza A by PCR NEGATIVE NEGATIVE   Influenza B by PCR NEGATIVE NEGATIVE    Comment: (NOTE) The Xpert Xpress SARS-CoV-2/FLU/RSV plus assay is intended as an aid in the diagnosis of influenza from Nasopharyngeal swab specimens and should not be used as a sole basis for treatment. Nasal washings and aspirates are unacceptable for Xpert Xpress SARS-CoV-2/FLU/RSV testing.  Fact Sheet for Patients: EntrepreneurPulse.com.au  Fact Sheet for Healthcare Providers: IncredibleEmployment.be  This test is not yet approved or cleared by the [place] FDA and has been authorized for detection and/or diagnosis of SARS-CoV-2 by FDA under an Emergency Use Authorization (EUA). This EUA will remain in effect (meaning this test can be used) for the duration of the COVID-19 declaration under Section 564(b)(1) of the Act, 21 U.S.C. section 360bbb-3(b)(1), unless the authorization is terminated or revoked.  Performed at [facility], [place] [address], [place], [place] [address]   Lactic acid, plasma     Status: None   Collection Time: 10/24/21 12:55 PM  Result Value Ref Range   Lactic Acid, Venous 1.7 0.5 - 1.9 mmol/L    Comment: Performed at [facility] [address] [place], [place] [address]  D-dimer, quantitative     Status: Abnormal   Collection Time: 10/24/21  5:15 PM  Result Value Ref Range   D-Dimer, Quant 0.78 (H) 0.00 - 0.50 ug/mL-FEU    Comment: (NOTE) At the manufacturer cut-off value of 0.5 g/mL FEU, this assay has a negative predictive value of 95-100%.This assay is intended for use in conjunction with a clinical pretest probability (PTP) assessment model to exclude pulmonary embolism (PE) and deep venous thrombosis (DVT) in outpatients suspected of PE or DVT. Results should be correlated with clinical presentation. Performed at [facility], [name] [address], [place], [place] [address]   Brain natriuretic peptide     Status: Abnormal   Collection Time: 10/24/21  5:16 PM  Result Value Ref Range   B Natriuretic Peptide 524.3 (H) 0.0 - 100.0 pg/mL    Comment: Performed at [facility] [address] [place], [place] [address]  Respiratory (~20 pathogens) panel by PCR     Status: Abnormal   Collection Time: 10/24/21  5:16 PM   Specimen: Nasopharyngeal Swab; Respiratory  Result Value Ref Range   Adenovirus NOT DETECTED NOT DETECTED   Coronavirus 229E NOT DETECTED NOT DETECTED    Comment: (NOTE) The Coronavirus on the Respiratory Panel, DOES NOT test for the novel  Coronavirus (2019 nCoV)    Coronavirus HKU1 NOT DETECTED NOT DETECTED   Coronavirus NL63 NOT DETECTED NOT DETECTED   Coronavirus OC43 NOT DETECTED NOT DETECTED   Metapneumovirus NOT DETECTED NOT DETECTED   Rhinovirus / Enterovirus NOT DETECTED NOT DETECTED   Influenza A NOT DETECTED NOT DETECTED   Influenza B NOT DETECTED NOT DETECTED   Parainfluenza Virus 1 NOT DETECTED NOT DETECTED   Parainfluenza Virus 2 NOT DETECTED NOT DETECTED   Parainfluenza Virus 3 DETECTED (A) NOT DETECTED   Parainfluenza Virus 4 NOT DETECTED NOT DETECTED   Respiratory Syncytial Virus NOT DETECTED NOT DETECTED   Bordetella pertussis NOT DETECTED NOT DETECTED   Bordetella Parapertussis NOT DETECTED NOT DETECTED   Chlamydophila pneumoniae NOT DETECTED NOT DETECTED   Mycoplasma pneumoniae NOT DETECTED NOT DETECTED    Comment: Performed at [facility], [place] [address] [place], [place] [address]  Procalcitonin     Status: None   Collection Time: 10/24/21  5:16 PM  Result Value Ref Range   Procalcitonin 0.18 ng/mL    Comment:        Interpretation: PCT (Procalcitonin) <= 0.5 ng/mL: Systemic infection (sepsis) is not likely. Local bacterial infection is possible. (NOTE)       Sepsis PCT Algorithm           Lower Respiratory Tract                                       Infection PCT Algorithm    ----------------------------     ----------------------------         PCT < 0.25 ng/mL                PCT < 0.10 ng/mL          Strongly encourage             Strongly discourage   discontinuation of antibiotics    initiation of antibiotics    ----------------------------     -----------------------------       PCT 0.25 - 0.50 ng/mL            PCT 0.10 - 0.25 ng/mL               OR       >80% decrease in PCT            Discourage initiation of                                            antibiotics      Encourage discontinuation           of antibiotics    ----------------------------     -----------------------------  PCT >= 0.50 ng/mL              PCT 0.26 - 0.50 ng/mL               AND        <80% decrease in PCT             Encourage initiation of                                             antibiotics       Encourage continuation           of antibiotics    ----------------------------     -----------------------------        PCT >= 0.50 ng/mL                  PCT > 0.50 ng/mL               AND         increase in PCT                  Strongly encourage                                      initiation of antibiotics    Strongly encourage escalation           of antibiotics                                     -----------------------------                                           PCT <= 0.25 ng/mL                                                 OR                                        > 80% decrease in PCT                                      Discontinue / Do not initiate                                             antibiotics  Performed at [facility], [address], [place], [place] [address]   TSH     Status: None   Collection Time: 10/24/21  6:00 PM  Result Value Ref Range   TSH 1.702 0.350 - 4.500 uIU/mL    Comment: Performed by a 3rd Generation assay with a functional sensitivity of <=0.01 uIU/mL. Performed at [facility], [place] [address], [place], [place] [address]   Heparin level (unfractionated)     Status: Abnormal   Collection Time: 10/25/21  2:13 AM  Result Value Ref Range   Heparin Unfractionated <0.10 (L) 0.30 - 0.70 IU/mL    Comment: (NOTE) The clinical reportable range upper limit is being lowered to >1.10 to align with the FDA approved guidance for the current laboratory assay.  If heparin results are below expected values, and patient dosage has  been confirmed, suggest follow up testing of antithrombin III levels. Performed at [facility], [place] [address] [place], [place] [address]   Basic metabolic panel     Status: Abnormal   Collection Time: 10/25/21  2:13 AM  Result Value Ref Range   Sodium 139 135 - 145 mmol/L   Potassium 3.3 (L) 3.5 - 5.1 mmol/L    Comment: DELTA CHECK NOTED   Chloride 100 98 - 111 mmol/L   CO2 23 22 - 32 mmol/L   Glucose, Bld 100 (H) 70 - 99 mg/dL    Comment: Glucose reference range applies only to samples taken after fasting for at least 8 hours.   BUN 25 (H) 8 - 23 mg/dL   Creatinine, Ser 1.08 (H) 0.44 - 1.00 mg/dL   Calcium 8.4 (L) 8.9 - 10.3 mg/dL   GFR, Estimated 50 (L) >60 mL/min    Comment: (NOTE) Calculated using the CKD-EPI Creatinine Equation (2021)    Anion gap 16 (H) 5 - 15    Comment: Performed at [facility] [address], [place], [place] [address]  C Difficile Quick Screen w PCR reflex     Status: None   Collection Time: 10/25/21 10:09 AM   Specimen: Stool  Result Value Ref Range   C Diff antigen NEGATIVE NEGATIVE   C Diff toxin NEGATIVE NEGATIVE   C Diff interpretation No C. difficile detected.     Comment: Performed at [facility], [place] [address] [place], [place] [address]   ECHOCARDIOGRAM COMPLETE  Result Date: 10/25/2021    ECHOCARDIOGRAM REPORT   Patient Name:   [name] Date of Exam: 10/25/2021 Medical Rec #:  [id number]      Height:       63.0 in Accession #:    [id number]     Weight:        130.0 lb Date of Birth:  1932/10/04      BSA:          1.610 m Patient Age:    89 years       BP:           118/60 mmHg Patient Gender: F              HR:           80 bpm. Exam Location:  Inpatient Procedure: 2D Echo, Color Doppler and Cardiac Doppler Indications:    hypoxia  History:        Patient has no prior history of Echocardiogram examinations.                 Arrythmias:Tachycardia and Atrial Fibrillation.  Sonographer:    Melissa Morford RDCS (AE, PE) Sonographer#2:  Ronny Flurry Referring Phys: [id number] Orma Flaming  Sonographer Comments: Suboptimal apical window. IMPRESSIONS  1. Left ventricular ejection fraction, by estimation, is 55 to 60%. The left ventricle has normal function. Left ventricular endocardial border not optimally defined to evaluate regional wall motion. There is  mild left ventricular hypertrophy. Left ventricular diastolic parameters are consistent with Grade I diastolic dysfunction (impaired relaxation).  2. Right ventricular systolic function is normal. The right ventricular size is normal. Tricuspid regurgitation signal is inadequate for assessing PA pressure.  3. The mitral valve is degenerative. No evidence of mitral valve regurgitation. Moderate mitral annular calcification.  4. The aortic valve was not well visualized. Aortic valve regurgitation is not visualized. No aortic stenosis is present.  5. The inferior vena cava is normal in size with greater than 50% respiratory variability, suggesting right atrial pressure of 3 mmHg. FINDINGS  Left Ventricle: Left ventricular ejection fraction, by estimation, is 55 to 60%. The left ventricle has normal function. Left ventricular endocardial border not optimally defined to evaluate regional wall motion. The left ventricular internal cavity size was small. There is mild left ventricular hypertrophy. Left ventricular diastolic parameters are consistent with Grade I diastolic dysfunction (impaired relaxation). Right Ventricle: The  right ventricular size is normal. No increase in right ventricular wall thickness. Right ventricular systolic function is normal. Tricuspid regurgitation signal is inadequate for assessing PA pressure. Left Atrium: Left atrial size was normal in size. Right Atrium: Right atrial size was normal in size. Pericardium: Trivial pericardial effusion is present. Mitral Valve: The mitral valve is degenerative in appearance. Moderate mitral annular calcification. No evidence of mitral valve regurgitation. Tricuspid Valve: The tricuspid valve is normal in structure. Tricuspid valve regurgitation is trivial. Aortic Valve: The aortic valve was not well visualized. Aortic valve regurgitation is not visualized. No aortic stenosis is present. Pulmonic Valve: The pulmonic valve was not well visualized. Pulmonic valve regurgitation is not visualized. Aorta: The aortic root and ascending aorta are structurally normal, with no evidence of dilitation. Venous: The inferior vena cava is normal in size with greater than 50% respiratory variability, suggesting right atrial pressure of 3 mmHg. IAS/Shunts: The interatrial septum was not well visualized.  LEFT VENTRICLE PLAX 2D LVOT diam:     2.00 cm   Diastology LV SV:         27        LV e' lateral:   5.87 cm/s LV SV Index:   17        LV E/e' lateral: 9.0 LVOT Area:     3.14 cm  LEFT ATRIUM           Index        RIGHT ATRIUM          Index LA Vol (A2C): 23.7 ml 14.72 ml/m  RA Area:     5.79 cm LA Vol (A4C): 17.9 ml 11.12 ml/m  RA Volume:   8.16 ml  5.07 ml/m  AORTIC VALVE LVOT Vmax:   50.30 cm/s LVOT Vmean:  32.600 cm/s LVOT VTI:    0.087 m  AORTA Ao Root diam: 3.10 cm Ao Asc diam:  3.30 cm MITRAL VALVE MV Area (PHT): 6.22 cm     SHUNTS MV Decel Time: 122 msec     Systemic VTI:  0.09 m MV E velocity: 52.70 cm/s   Systemic Diam: 2.00 cm MV A velocity: 105.00 cm/s MV E/A ratio:  0.50 Oswaldo Milian MD Electronically signed by Oswaldo Milian MD Signature Date/Time:  10/25/2021/2:10:56 PM    Final    CT Angio Chest PE W and/or Wo Contrast  Result Date: 10/24/2021 CLINICAL DATA:  Pulmonary embolism suspected, high probability. EXAM: CT ANGIOGRAPHY CHEST WITH CONTRAST TECHNIQUE: Multidetector CT imaging of the chest was performed using the standard protocol  during bolus administration of intravenous contrast. Multiplanar CT image reconstructions and MIPs were obtained to evaluate the vascular anatomy. RADIATION DOSE REDUCTION: This exam was performed according to the departmental dose-optimization program which includes automated exposure control, adjustment of the mA and/or kV according to patient size and/or use of iterative reconstruction technique. CONTRAST:  69m OMNIPAQUE IOHEXOL 350 MG/ML SOLN COMPARISON:  09/06/2010. FINDINGS: Cardiovascular: The heart is mildly enlarged and there is no pericardial effusion. Scattered coronary artery calcifications are noted. There is atherosclerotic calcification of the aorta without evidence of aneurysm. The pulmonary trunk is normal in caliber. No definite pulmonary artery filling defect. Mediastinum/Nodes: Multiple enlarged lymph nodes are present in the mediastinum measuring up to 1.8 cm in the paratracheal space on the left. Hilar lymphadenopathy is present bilaterally. No axillary lymphadenopathy. The thyroid gland and trachea are within normal limits. The visualized esophagus is within normal limits. There is a large hiatal hernia with the stomach intrathoracic in location. Lungs/Pleura: There is consolidation in the right lower lobe and patchy airspace disease at the left lung base. Tree-in-bud nodular densities are noted in the right upper and middle lobes. No effusion or pneumothorax. Upper Abdomen: No acute abnormality. There is reflux of contrast into the inferior vena cava and hepatic veins suggesting right heart failure. Musculoskeletal: Degenerative changes are present in the thoracic spine. Laminectomy changes and spinal  fusion hardware is noted in the upper lumbar spine. No acute fracture is seen. Bilateral breast implants are noted. Review of the MIP images confirms the above findings. IMPRESSION: 1. No evidence of pulmonary embolism. 2. Consolidation in the right lower lobe with patchy airspace disease in the left lower lobe, possible atelectasis or infiltrate. Follow-up is recommended until resolution to exclude the possibility of underlying neoplastic process. 3. Scattered tree-in-bud nodular opacities in the right upper and middle lobes which may be infectious or inflammatory. 4. Mediastinal lymphadenopathy, which may be reactive. 5. Cardiomegaly with coronary artery calcifications. 6. Aortic atherosclerosis. 7. Large hiatal hernia. Electronically Signed   By: LBrett FairyM.D.   On: 10/24/2021 21:35   CT ABDOMEN PELVIS W CONTRAST  Result Date: 10/24/2021 CLINICAL DATA:  Nausea/vomiting Abdominal pain, acute, nonlocalized EXAM: CT ABDOMEN AND PELVIS WITH CONTRAST TECHNIQUE: Multidetector CT imaging of the abdomen and pelvis was performed using the standard protocol following bolus administration of intravenous contrast. RADIATION DOSE REDUCTION: This exam was performed according to the departmental dose-optimization program which includes automated exposure control, adjustment of the mA and/or kV according to patient size and/or use of iterative reconstruction technique. CONTRAST:  646mOMNIPAQUE IOHEXOL 300 MG/ML  SOLN COMPARISON:  CT abdomen pelvis 03/28/2016 FINDINGS: Lower chest: Elevated right hemidiaphragm. Right basilar consolidation. Additional tree-in-bud opacities in the right middle lobe, lingula, and left lower lobe. Mild bronchiectasis. Right middle lobe atelectasis. Bilateral breast implants with calcified capsules. Hepatobiliary: Unchanged hepatic cysts. The gallbladder is unremarkable. Pancreas: Unremarkable. No pancreatic ductal dilatation or surrounding inflammatory changes. Spleen: Unchanged lobulation  of the spleen with areas of scarring and evidence of of prior infarcts. No new splenic abnormality. Adrenals/Urinary Tract: Adrenal glands are unremarkable. No hydronephrosis or nephrolithiasis. Unchanged bilateral renal cyst. No follow-up imaging is recommended. There is marked distension of the urinary bladder. Stomach/Bowel: Large hiatal hernia with intrathoracic stomach. There is no evidence of bowel obstruction.Prior appendectomy. Mild-to-moderate stool burden. Scattered colonic diverticula. Vascular/Lymphatic: Aortoiliac atherosclerosis. There is a posterior mediastinal enlarged lymph node measuring up to 1.2 cm short axis (series 3, image 1), likely reactive. Reproductive: Prior hysterectomy. Other:  Loss of body fat in comparison prior exam in 2017. No hernia. No significant ascites. No free air. Musculoskeletal: Osteopenia. No acute osseous abnormality. Unchanged lumbosacral fusion hardware without evidence of complication. Multilevel degenerative changes of the spine. Unchanged spinal alignment. IMPRESSION: Right lower lobe pneumonia with additional tree-in-bud opacities in the middle lobe, lingula, and left lower lobe, potentially aspiration-related. Large hiatal hernia with intrathoracic stomach. Mild-to-moderate stool burden suggesting constipation. Marked distension of the urinary bladder. Electronically Signed   By: Maurine Simmering M.D.   On: 10/24/2021 14:24   DG Chest 2 View  Result Date: 10/24/2021 CLINICAL DATA:  Chest pain, nausea and abdominal pain. EXAM: CHEST - 2 VIEW COMPARISON:  01/09/2021 FINDINGS: Large hiatal hernia is again noted. Stable cardiomediastinal contours. No pleural effusion or edema. Chronic asymmetric elevation of the right hemidiaphragm. Mild atelectasis noted within the right base. No airspace consolidation identified. Spondylosis noted within the lower thoracic spine. Capsular calcification of bilateral breast implants noted IMPRESSION: Chronic asymmetric elevation of the  right hemidiaphragm with right base atelectasis. Large hiatal hernia. Aortic Atherosclerosis (ICD10-I70.0). Electronically Signed   By: Kerby Moors M.D.   On: 10/24/2021 11:25    Pending Labs Unresulted Labs (From admission, onward)     Start     Ordered   11/01/21 0500  Creatinine, serum  (enoxaparin (LOVENOX)    CrCl >/= 30 ml/min)  Weekly,   R     Comments: while on enoxaparin therapy    10/25/21 1410   10/26/21 0500  Heparin level (unfractionated)  Daily at 5am,   R     See Hyperspace for full Linked Orders Report.   10/24/21 1757   10/26/21 0500  CBC  Daily at 5am,   R        10/25/21 0911   10/26/21 1165  Basic metabolic panel  Daily at 5am,   R      10/25/21 0911   10/25/21 1100  Heparin level (unfractionated)  Once-Timed,   TIMED        10/25/21 0318   10/25/21 0908  Gastrointestinal Panel by PCR , Stool  (Gastrointestinal Panel by PCR, Stool                                                                                                                                                     **Does Not include CLOSTRIDIUM DIFFICILE testing. **If CDIFF testing is needed, place order from the "C Difficile Testing" order set.**)  Once,   R        10/25/21 0907   10/25/21 0500  CBC  Daily at 5am,   R     See Hyperspace for full Linked Orders Report.   10/24/21 1757   10/24/21 1951  Legionella Pneumophila Serogp 1 Ur Ag  (COPD / Pneumonia / Cellulitis / Lower Extremity Wound)  Once,   AD        10/24/21 1952            Vitals/Pain Today's Vitals   10/25/21 1100 10/25/21 1313 10/25/21 1345 10/25/21 1415  BP: 118/60 (!) 129/59 (!) 105/56   Pulse: 86 90 86 95  Resp: 14 18 20 18   Temp:      TempSrc:      SpO2: 94% 94% 91% 96%  PainSc:        Isolation Precautions Enteric precautions (UV disinfection)  Medications Medications  morphine (MS CONTIN) 12 hr tablet 100 mg (100 mg Oral Given 10/25/21 1009)  sertraline (ZOLOFT) tablet 25 mg (25 mg Oral Given 10/25/21 1010)   famotidine (PEPCID) tablet 20 mg (20 mg Oral Given 10/24/21 2156)  pantoprazole (PROTONIX) EC tablet 40 mg (40 mg Oral Given 10/25/21 1008)  polyethylene glycol (MIRALAX / GLYCOLAX) packet 17 g (has no administration in time range)  sucralfate (CARAFATE) tablet 1 g (1 g Oral Given 10/25/21 1343)  ferrous sulfate tablet 325 mg (325 mg Oral Given 10/25/21 1008)  melatonin tablet 3 mg (3 mg Oral Given 10/24/21 2156)  chlorhexidine gluconate (MEDLINE KIT) (PERIDEX) 0.12 % solution 15 mL (15 mLs Mouth/Throat Not Given 10/25/21 1009)  sodium chloride flush (NS) 0.9 % injection 3 mL (3 mLs Intravenous Given 10/25/21 1009)  sodium chloride flush (NS) 0.9 % injection 3 mL (3 mLs Intravenous Given 10/24/21 2159)  0.9 %  sodium chloride infusion (has no administration in time range)  acetaminophen (TYLENOL) tablet 650 mg (has no administration in time range)    Or  acetaminophen (TYLENOL) suppository 650 mg (has no administration in time range)  ondansetron (ZOFRAN) tablet 4 mg (4 mg Oral Given 10/25/21 1343)    Or  ondansetron (ZOFRAN) injection 4 mg ( Intravenous See Alternative 10/25/21 1343)  metoCLOPramide (REGLAN) injection 10 mg (has no administration in time range)  metoprolol tartrate (LOPRESSOR) tablet 50 mg (50 mg Oral Given 10/25/21 1008)  enoxaparin (LOVENOX) injection 40 mg (has no administration in time range)  lactated ringers bolus 1,000 mL (0 mLs Intravenous Stopped 10/24/21 1355)  ondansetron (ZOFRAN) injection 4 mg (4 mg Intravenous Given 10/24/21 1257)  fentaNYL (SUBLIMAZE) injection 25 mcg (25 mcg Intravenous Given 10/24/21 1302)  iohexol (OMNIPAQUE) 300 MG/ML solution 60 mL (60 mLs Intravenous Contrast Given 10/24/21 1355)  Glycerin (Adult) 2 g suppository 1 suppository (1 suppository Rectal Given 10/24/21 1604)  sodium phosphate (FLEET) 7-19 GM/118ML enema 1 enema (1 enema Rectal Given 10/24/21 1601)  Ampicillin-Sulbactam (UNASYN) 3 g in sodium chloride 0.9 % 100 mL IVPB (0 g Intravenous Stopped 10/24/21  1715)  heparin bolus via infusion 2,500 Units (2,500 Units Intravenous Bolus from Bag 10/24/21 1815)  iohexol (OMNIPAQUE) 350 MG/ML injection 50 mL (50 mLs Intravenous Contrast Given 10/24/21 2122)  heparin bolus via infusion 1,000 Units (1,000 Units Intravenous Bolus from Bag 10/25/21 0334)  potassium chloride (KLOR-CON) packet 60 mEq (60 mEq Oral Given 10/25/21 1344)    Mobility manual wheelchair High fall risk   Focused Assessments    R Recommendations: See Admitting Provider Note  Report given to:   Additional Notes:

## 2021-10-25 NOTE — Progress Notes (Signed)
Clinical/Bedside Swallow Evaluation Patient Details  Name: [name] MRN: [id number] Date of Birth: 04/19/1932  Today's Date: 10/25/2021 Time: SLP Start Time (ACUTE ONLY): 80 SLP Stop Time (ACUTE ONLY): 1408 SLP Time Calculation (min) (ACUTE ONLY): 13 min  Past Medical History:  Past Medical History:  Diagnosis Date   Anemia 10-05-12   hgb-9.0 on 09-14-12   Arm vein blood clot 10-05-12   Rt. arm '12   Arthritis 10-05-12   degenerative joint disease,scoliosis spine   Aspiration pneumonia ([place])    Dysrhythmia 10-05-12   tachycardia -tx. Lopressor   GERD (gastroesophageal reflux disease)    H/O esophagitis    H/O hiatal hernia    Tachycardia    Transfusion history 10-05-12   4 units blood '12   Past Surgical History:  Past Surgical History:  Procedure Laterality Date   ABDOMINAL HYSTERECTOMY     APPENDECTOMY     child   BACK SURGERY  10-05-12   x5-last fusion with plates   BREAST ENHANCEMENT SURGERY     CATARACT EXTRACTION, BILATERAL     COLONOSCOPY WITH PROPOFOL N/A 10/23/2012   Procedure: COLONOSCOPY WITH PROPOFOL;  Surgeon: Lear Ng, MD;  Location: [facility];  Service: Endoscopy;  Laterality: N/A;   HOT HEMOSTASIS N/A 10/23/2012   Procedure: HOT HEMOSTASIS (ARGON PLASMA COAGULATION/BICAP);  Surgeon: Lear Ng, MD;  Location: [facility];  Service: Endoscopy;  Laterality: N/A;   HPI:  [name] is a 88 y.o. female with medical history significant of anemia, hiatal hernia, GERD, HTN, constipation, sinus tachycardia who presented to ED with complaints of N/V and stomach pain. She was found to be hypoxic on room air at 86%. CXR large hiatal hernia, chronic asymmetric elevation of the right hemidiaphragm with right base atelectasis. BSE 2018, no s/s aspiration reco Dys 3/thin.    Assessment / Plan / Recommendation  Clinical Impression  Pt seen for swallow evaluation who denies dysphagia. She was seen in 2018 without s/s aspiration and placed on Dys 3  diet and esophageal precautions due to hiatal hernia and GERD. Assessment was limited today as she was nauseated but agreeable to bite graham cracker, water and declined pudding or any of her lunch tray. Both solid and approximately 3 oz thin water (straw) were consumed with coordinated oropharyngeal swallow form subjective view. Functional mastication without upper dentition (does not use her plate at home). She denies symptoms of refulx recently. Recommend she continue with regular texture, thin liquids, pills with water (RN stated no difficulty this am) and reflux precautions. No further ST needed. SLP Visit Diagnosis: Dysphagia, unspecified (R13.10)    Aspiration Risk  Mild aspiration risk    Diet Recommendation Regular;Thin liquid   Liquid Administration via: Cup;Straw Medication Administration: Whole meds with liquid Supervision: Patient able to self feed Compensations: Small sips/bites Postural Changes: Seated upright at 90 degrees    Other  Recommendations Oral Care Recommendations: Oral care BID    Recommendations for follow up therapy are one component of a multi-disciplinary discharge planning process, led by the attending physician.  Recommendations may be updated based on patient status, additional functional criteria and insurance authorization.  Follow up Recommendations No SLP follow up      Assistance Recommended at Discharge None  Functional Status Assessment    Frequency and Duration            Prognosis        Swallow Study   General Date of Onset: 10/24/21 HPI: [name]  is a 88 y.o. female with medical history significant of anemia, hiatal hernia, GERD, HTN, constipation, sinus tachycardia who presented to ED with complaints of N/V and stomach pain. She was found to be hypoxic on room air at 86%. CXR large hiatal hernia, chronic asymmetric elevation of the right hemidiaphragm with right base atelectasis. BSE 2018, no s/s aspiration reco Dys 3/thin. Type  of Study: Bedside Swallow Evaluation Previous Swallow Assessment:  (see HPI) Diet Prior to this Study: Regular;Thin liquids Temperature Spikes Noted: No Respiratory Status: Nasal cannula History of Recent Intubation: No Behavior/Cognition: Alert;Cooperative;Pleasant mood Oral Cavity Assessment: Within Functional Limits Oral Care Completed by SLP: No Oral Cavity - Dentition: Missing dentition (missing upper, has dentures does not wear) Vision: Functional for self-feeding Self-Feeding Abilities: Able to feed self Patient Positioning: Upright in bed Baseline Vocal Quality: Normal Volitional Cough: Strong Volitional Swallow: Able to elicit    Oral/Motor/Sensory Function Overall Oral Motor/Sensory Function: Within functional limits   Ice Chips Ice chips: Not tested   Thin Liquid Thin Liquid: Within functional limits Presentation: Straw    Nectar Thick Nectar Thick Liquid: Not tested   Honey Thick Honey Thick Liquid: Not tested   Puree Puree:  (pt refused due to nausea)   Solid     Solid: Within functional limits      Houston Siren 10/25/2021,2:24 PM

## 2021-10-25 NOTE — ED Notes (Signed)
Breakfast order placed ?

## 2021-10-25 NOTE — ED Notes (Signed)
Patient transported to echo ?

## 2021-10-25 NOTE — ED Notes (Signed)
Speech therapy at bedside.

## 2021-10-25 NOTE — Consult Note (Addendum)
Cardiology Consultation:   Patient ID: [name] MRN: [id number]; DOB: 21-Aug-1932  Admit date: 10/24/2021 Date of Consult: 10/25/2021  PCP:  Alroy Dust, L.Marlou Sa, MD   [place] [facility] Cardiologist:  Berniece Salines, DO   new  Patient Profile:   [name] is a 88 y.o. female with a hx of anemia, GERD, HTN, constipation, sinus tachycardia in the setting of sepsis, who is being seen 10/25/2021 for the evaluation of atrial fib at the request of Dr Si Raider.  History of Present Illness:   Ms. Hagey was admitted 07/10 w/ N&V, abd pain. She was hypoxic at 86% on room air. Abd CT w/ constipation & HH. CXR abnl: Dx viral PNA, sepsis, +parainfluenza virus, Covid neg.   Initially in SR, then dx atrial fib, RVR. Started on heparin and continued on home metoprolol. Cards consulted for the atrial fib.   Ms. Brownley is completely unaware of any rapid or irregular rhythm.  Even when she is in sinus rhythm, she has frequent PVCs and PACs.  She is unaware of these.  1 time when she was in the hospital for sepsis, she was told she had atrial fibrillation, but then they decided she did not.  She has never been on anticoagulation.  She has no history of presyncope or syncope.  She lives in a facility, and they check her heart rate and oxygen saturation on a daily basis.  She has never been told either 1 of these was abnormal until her acute illness.  She started getting sick on Saturday, with nausea.  That was her major symptom.  It was not until she got to the hospital on Sunday, that she realized her oxygen level was low.  She is currently on oxygen and feels fine.   Past Medical History:  Diagnosis Date   Anemia 10-05-12   hgb-9.0 on 09-14-12   Arm vein blood clot 10-05-12   Rt. arm '12   Arthritis 10-05-12   degenerative joint disease,scoliosis spine   Aspiration pneumonia ([place])    Dysrhythmia 10-05-12   tachycardia -tx. Lopressor   GERD (gastroesophageal reflux disease)    H/O esophagitis     H/O hiatal hernia    Tachycardia    Transfusion history 10-05-12   4 units blood '12    Past Surgical History:  Procedure Laterality Date   ABDOMINAL HYSTERECTOMY     APPENDECTOMY     child   BACK SURGERY  10-05-12   x5-last fusion with plates   BREAST ENHANCEMENT SURGERY     CATARACT EXTRACTION, BILATERAL     COLONOSCOPY WITH PROPOFOL N/A 10/23/2012   Procedure: COLONOSCOPY WITH PROPOFOL;  Surgeon: Lear Ng, MD;  Location: [facility];  Service: Endoscopy;  Laterality: N/A;   HOT HEMOSTASIS N/A 10/23/2012   Procedure: HOT HEMOSTASIS (ARGON PLASMA COAGULATION/BICAP);  Surgeon: Lear Ng, MD;  Location: [facility];  Service: Endoscopy;  Laterality: N/A;     Home Medications:  Prior to Admission medications   Medication Sig Start Date End Date Taking? Authorizing Provider  acetaminophen (TYLENOL) 500 MG tablet Take 1,000 mg by mouth every 6 (six) hours as needed for moderate pain.   Yes [provider]  aspirin 81 MG tablet Take 81 mg by mouth at bedtime.    Yes [provider]  chlorhexidine (PERIDEX) 0.12 % solution Use as directed 15 mLs in the mouth or throat 2 (two) times daily. 09/27/21  Yes [provider]  Cholecalciferol (VITAMIN D-3) 125 MCG (5000 UT)  TABS Take 1 tablet by mouth daily.   Yes [provider]  famotidine (PEPCID) 20 MG tablet Take 20 mg by mouth at bedtime. 09/22/21  Yes [provider]  ferrous sulfate 325 (65 FE) MG tablet Take 325 mg by mouth daily with breakfast.   Yes [provider]  furosemide (LASIX) 20 MG tablet Take 20 mg by mouth daily. 09/17/21  Yes [provider]  LACTOBACILLUS PO Take 1 capsule by mouth daily.   Yes [provider]  loperamide (IMODIUM) 2 MG capsule Take 2 mg by mouth every 6 (six) hours as needed for diarrhea or loose stools.   Yes [provider]  melatonin 3 MG TABS tablet Take 3 mg by mouth at bedtime.   Yes [provider]  metoprolol (LOPRESSOR) 50 MG tablet Take 50 mg by mouth 2 (two) times daily.   Yes [provider]  morphine (MS CONTIN) 100 MG 12 hr tablet Take 100 mg by mouth every 12 (twelve) hours.   Yes [provider]  ondansetron (ZOFRAN) 8 MG tablet Take 8 mg by mouth every 8 (eight) hours as needed for nausea or vomiting.   Yes [provider]  pantoprazole (PROTONIX) 40 MG tablet Take 40 mg by mouth daily.   Yes [provider]  polyethylene glycol (MIRALAX / GLYCOLAX) packet Take 17 g by mouth 2 (two) times daily. Patient taking differently: Take 17 g by mouth daily as needed for moderate constipation. 03/26/16  Yes Charlynne Cousins, MD  sertraline (ZOLOFT) 25 MG tablet Take 25 mg by mouth daily. 10/17/21  Yes [provider]  sucralfate (CARAFATE) 1 g tablet Take 1 g by mouth 3 (three) times daily. 10/10/21  Yes [provider]  Zinc Oxide 10 % OINT Apply 1 Application topically in the morning and at bedtime.   Yes [provider]  OXYGEN Inhale 2 L/min into the lungs. Via nasal cannula    [provider]    Inpatient Medications: Scheduled Meds:  chlorhexidine gluconate (MEDLINE KIT)  15 mL Mouth/Throat BID   famotidine  20 mg Oral QHS   ferrous sulfate  325 mg Oral Q breakfast   melatonin  3 mg Oral QHS   metoprolol tartrate  50 mg Oral BID   morphine  100 mg Oral Q12H   pantoprazole  40 mg Oral Daily   potassium chloride  60 mEq Oral Once   sertraline  25 mg Oral Daily   sodium chloride flush  3 mL Intravenous Q12H   sucralfate  1 g Oral TID AC   Continuous Infusions:  sodium chloride     heparin 950 Units/hr (10/25/21 0333)   PRN Meds: sodium chloride, acetaminophen **OR** acetaminophen, metoCLOPramide (REGLAN) injection, ondansetron **OR** ondansetron (ZOFRAN) IV, polyethylene glycol, sodium chloride flush  Allergies:    Allergies  Allergen Reactions   Ciprofloxacin Rash    burning    Social  History:   Social History   Socioeconomic History   Marital status: Widowed    Spouse name: Not on file   Number of children: Not on file   Years of education: Not on file   Highest education level: Not on file  Occupational History   Not on file  Tobacco Use   Smoking status: Never   Smokeless tobacco: Never  Substance and Sexual Activity   Alcohol use: No   Drug use: No   Sexual activity: Not Currently  Other Topics Concern   Not on  file  Social History Narrative   Not on file   Social Determinants of Health   Financial Resource Strain: Not on file  Food Insecurity: Not on file  Transportation Needs: Not on file  Physical Activity: Not on file  Stress: Not on file  Social Connections: Not on file  Intimate Partner Violence: Not on file    Family History:   Family History  Problem Relation Age of Onset   Cancer Mother    Cancer Father    Diabetes Sister    Stroke Neg Hx    CAD Neg Hx      ROS:  Please see the history of present illness.  All other ROS reviewed and negative.     Physical Exam/Data:   Vitals:   10/25/21 1008 10/25/21 1015 10/25/21 1056 10/25/21 1100  BP: 131/74 (!) 121/94  118/60  Pulse: 97   86  Resp:  12  14  Temp:  97.8 F (36.6 C) 98.7 F (37.1 C)   TempSrc:   Oral   SpO2:    94%    Intake/Output Summary (Last 24 hours) at 10/25/2021 1214 Last data filed at 10/24/2021 1715 Gross per 24 hour  Intake 100.17 ml  Output 800 ml  Net -699.83 ml      01/09/2021    9:14 PM 08/25/2016    1:24 PM 07/28/2016   11:57 AM  Last 3 Weights  Weight (lbs) 130 lb 116 lb 6.4 oz 120 lb 9.6 oz  Weight (kg) 58.968 kg 52.799 kg 54.704 kg     There is no height or weight on file to calculate BMI.  General: Frail elderly female, in no acute distress HEENT: normal Neck: Mild JVD Vascular: No carotid bruits; Distal pulses 2+ bilaterally Cardiac:  normal S1, S2; irregular rate and rhythm; no murmur  Lungs: Rales bases bilaterally, no wheezing,  rhonchi  Abd: soft, nontender, no hepatomegaly  Ext: no edema Musculoskeletal:  No deformities, BUE and BLE strength weak but equal Skin: warm and dry  Neuro:  CNs 2-12 intact, no focal abnormalities noted Psych:  Normal affect   EKG:  The EKG was personally reviewed and demonstrates:   Sinus rhythm, heart rate 97, Q waves V1 -V3 plus III and aVF  Telemetry:  Telemetry was personally reviewed and demonstrates: Sinus rhythm with frequent PVCs and PACs.  Rare 3 beat runs of VT.    She has tachycardia at times, with the ectopy making her heartbeat appear irregular.    At times, because of artifact, it is impossible to tell whether or not she is in atrial fibrillation.  However, when the complexes are clearly visible, it is sinus rhythm with ectopy.  Relevant CV Studies:  ECHO: 10/25/2021 pending  ECHO: 2018 - Left ventricle: The cavity size was normal. Wall thickness was    normal. Systolic function was normal. The estimated ejection    fraction was in the range of 55% to 60%. Doppler parameters are    consistent with abnormal left ventricular relaxation (grade 1    diastolic dysfunction).  - Aortic valve: There was trivial regurgitation.  - Mitral valve: Calcified annulus. Mildly thickened leaflets .  - Pulmonary arteries: PA peak pressure: 43 mm Hg (S).   Laboratory Data:  High Sensitivity Troponin:  No results for input(s): "TROPONINIHS" in the last 720 hours.   Chemistry Recent Labs  Lab 10/24/21 1044 10/25/21 0213  NA 137 139  K 4.0 3.3*  CL 95* 100  CO2 25 23  GLUCOSE 121* 100*  BUN 28* 25*  CREATININE 1.34* 1.08*  CALCIUM 9.4 8.4*  GFRNONAA 38* 50*  ANIONGAP 17* 16*    Recent Labs  Lab 10/24/21 1044  PROT 7.1  ALBUMIN 3.4*  AST 21  ALT 9  ALKPHOS 48  BILITOT 0.7   Lipids No results for input(s): "CHOL", "TRIG", "HDL", "LABVLDL", "LDLCALC", "CHOLHDL" in the last 168 hours.  Hematology Recent Labs  Lab 10/24/21 1044  WBC 14.7*  RBC 4.18  HGB 12.8   HCT 40.6  MCV 97.1  MCH 30.6  MCHC 31.5  RDW 14.6  PLT 358   Thyroid  Recent Labs  Lab 10/24/21 1800  TSH 1.702    BNP Recent Labs  Lab 10/24/21 1716  BNP 524.3*    DDimer  Recent Labs  Lab 10/24/21 1715  DDIMER 0.78*   Lab Results  Component Value Date   CHOL 77 09/08/2010   HDL 31 (L) 09/08/2010   LDLCALC  09/08/2010    31          TRIG 76 09/08/2010   CHOLHDL 2.5 09/08/2010     Radiology/Studies:  CT Angio Chest PE W and/or Wo Contrast  Result Date: 10/24/2021 CLINICAL DATA:  Pulmonary embolism suspected, high probability. EXAM: CT ANGIOGRAPHY CHEST WITH CONTRAST TECHNIQUE: Multidetector CT imaging of the chest was performed using the standard protocol during bolus administration of intravenous contrast. Multiplanar CT image reconstructions and MIPs were obtained to evaluate the vascular anatomy. RADIATION DOSE REDUCTION: This exam was performed according to the departmental dose-optimization program which includes automated exposure control, adjustment of the mA and/or kV according to patient size and/or use of iterative reconstruction technique. CONTRAST:  45m OMNIPAQUE IOHEXOL 350 MG/ML SOLN COMPARISON:  09/06/2010. FINDINGS: Cardiovascular: The heart is mildly enlarged and there is no pericardial effusion. Scattered coronary artery calcifications are noted. There is atherosclerotic calcification of the aorta without evidence of aneurysm. The pulmonary trunk is normal in caliber. No definite pulmonary artery filling defect. Mediastinum/Nodes: Multiple enlarged lymph nodes are present in the mediastinum measuring up to 1.8 cm in the paratracheal space on the left. Hilar lymphadenopathy is present bilaterally. No axillary lymphadenopathy. The thyroid gland and trachea are within normal limits. The visualized esophagus is within normal limits. There is a large hiatal hernia with the stomach intrathoracic in location. Lungs/Pleura: There is consolidation in the right lower  lobe and patchy airspace disease at the left lung base. Tree-in-bud nodular densities are noted in the right upper and middle lobes. No effusion or pneumothorax. Upper Abdomen: No acute abnormality. There is reflux of contrast into the inferior vena cava and hepatic veins suggesting right heart failure. Musculoskeletal: Degenerative changes are present in the thoracic spine. Laminectomy changes and spinal fusion hardware is noted in the upper lumbar spine. No acute fracture is seen. Bilateral breast implants are noted. Review of the MIP images confirms the above findings. IMPRESSION: 1. No evidence of pulmonary embolism. 2. Consolidation in the right lower lobe with patchy airspace disease in the left lower lobe, possible atelectasis or infiltrate. Follow-up is recommended until resolution to exclude the possibility of underlying neoplastic process. 3. Scattered tree-in-bud nodular opacities in the right upper and middle lobes which may be infectious or inflammatory. 4. Mediastinal lymphadenopathy, which may be reactive. 5. Cardiomegaly with coronary artery calcifications. 6. Aortic atherosclerosis. 7. Large hiatal hernia. Electronically Signed   By: LBrett FairyM.D.   On: 10/24/2021 21:35   CT ABDOMEN PELVIS W CONTRAST  Result Date: 10/24/2021  CLINICAL DATA:  Nausea/vomiting Abdominal pain, acute, nonlocalized EXAM: CT ABDOMEN AND PELVIS WITH CONTRAST TECHNIQUE: Multidetector CT imaging of the abdomen and pelvis was performed using the standard protocol following bolus administration of intravenous contrast. RADIATION DOSE REDUCTION: This exam was performed according to the departmental dose-optimization program which includes automated exposure control, adjustment of the mA and/or kV according to patient size and/or use of iterative reconstruction technique. CONTRAST:  19m OMNIPAQUE IOHEXOL 300 MG/ML  SOLN COMPARISON:  CT abdomen pelvis 03/28/2016 FINDINGS: Lower chest: Elevated right hemidiaphragm. Right  basilar consolidation. Additional tree-in-bud opacities in the right middle lobe, lingula, and left lower lobe. Mild bronchiectasis. Right middle lobe atelectasis. Bilateral breast implants with calcified capsules. Hepatobiliary: Unchanged hepatic cysts. The gallbladder is unremarkable. Pancreas: Unremarkable. No pancreatic ductal dilatation or surrounding inflammatory changes. Spleen: Unchanged lobulation of the spleen with areas of scarring and evidence of of prior infarcts. No new splenic abnormality. Adrenals/Urinary Tract: Adrenal glands are unremarkable. No hydronephrosis or nephrolithiasis. Unchanged bilateral renal cyst. No follow-up imaging is recommended. There is marked distension of the urinary bladder. Stomach/Bowel: Large hiatal hernia with intrathoracic stomach. There is no evidence of bowel obstruction.Prior appendectomy. Mild-to-moderate stool burden. Scattered colonic diverticula. Vascular/Lymphatic: Aortoiliac atherosclerosis. There is a posterior mediastinal enlarged lymph node measuring up to 1.2 cm short axis (series 3, image 1), likely reactive. Reproductive: Prior hysterectomy. Other: Loss of body fat in comparison prior exam in 2017. No hernia. No significant ascites. No free air. Musculoskeletal: Osteopenia. No acute osseous abnormality. Unchanged lumbosacral fusion hardware without evidence of complication. Multilevel degenerative changes of the spine. Unchanged spinal alignment. IMPRESSION: Right lower lobe pneumonia with additional tree-in-bud opacities in the middle lobe, lingula, and left lower lobe, potentially aspiration-related. Large hiatal hernia with intrathoracic stomach. Mild-to-moderate stool burden suggesting constipation. Marked distension of the urinary bladder. Electronically Signed   By: JMaurine SimmeringM.D.   On: 10/24/2021 14:24   DG Chest 2 View  Result Date: 10/24/2021 CLINICAL DATA:  Chest pain, nausea and abdominal pain. EXAM: CHEST - 2 VIEW COMPARISON:  01/09/2021  FINDINGS: Large hiatal hernia is again noted. Stable cardiomediastinal contours. No pleural effusion or edema. Chronic asymmetric elevation of the right hemidiaphragm. Mild atelectasis noted within the right base. No airspace consolidation identified. Spondylosis noted within the lower thoracic spine. Capsular calcification of bilateral breast implants noted IMPRESSION: Chronic asymmetric elevation of the right hemidiaphragm with right base atelectasis. Large hiatal hernia. Aortic Atherosclerosis (ICD10-I70.0). Electronically Signed   By: TKerby MoorsM.D.   On: 10/24/2021 11:25     Assessment and Plan:   Possible atrial fibrillation -All strips and ECGs that are not obscured by artifact, are sinus rhythm - Her heart rate is irregular because of frequent ventricular and atrial ectopy. - She is at high risk to have atrial fibrillation because of her acute respiratory illness, hypoxia, and age -TSH is normal - Echo results are pending, will review for EF and atrial dilatation among other things -She is currently on heparin, prior to admission was on aspirin 81 mg daily -She is on her home dose of metoprolol 50 mg twice daily.  Her blood pressure is well controlled, do not think she will tolerate a dose increase - Heart rate could appropriately be elevated due to her acute illness - MD advise on oral anticoagulation  2.  Coronary calcifications - She has no history of CAD, does not get chest pain - However, she had extensive coronary calcifications on the CT done on admission. -Continue aspirin  and beta-blocker, LDL was 31 in 2012, check profile but doubt she will need a statin.  3.  Hypokalemia: - Her potassium was 3.3 on admission, supplement as ordered - Recheck in a.m.  4.  Right lower lobe pneumonia -She has tested positive for parainfluenza, but negative for COVID and flu -Currently still requires oxygen at 6 L - Management per IM  5. Elevated BNP -Although her BNP is above  normal, her chest x-ray and CTs do not show fluid -Her home dose of Lasix is 20 mg daily - Resume this per MD   Risk Assessment/Risk Scores:      This patients CHA2DS2-VASc Score and unadjusted Ischemic Stroke Rate (% per year) is equal to 4.8 % stroke rate/year from a score of 4  Above score calculated as 1 point each if present [CHF, HTN, DM, Vascular=MI/PAD/Aortic Plaque, Age if 65-74, or Female] Above score calculated as 2 points each if present [Age > 75, or Stroke/TIA/TE]   Patient seen and examined, note reviewed with the signed Advanced Practice Provider. I personally reviewed laboratory data, imaging studies and relevant notes. I independently examined the patient and formulated the important aspects of the plan. I have personally discussed the plan with the patient and/or family. Comments or changes to the note/plan are indicated below.  Patient seen and examined at her bedside and request of her primary attending for atrial fibrillation.  I was able to review her telemetry and all of his EKG there is no evidence of atrial fibrillation she does have frequent PACs which will suffice the irregularity of her heart rhythm.  Based on no finding of atrial fibrillation please stop anticoagulation.  She had an echocardiogram done today showing normal EF with diastolic dysfunction.  No evidence of any fluid overload.  Pneumonia is being managed by the primary team.  Thank you for consultation.  we will sign off at this time.   Berniece Salines DO, MS [facility] Attending Cardiologist [place]  [address] [place], [place] [address] [phone] Website: [email or web address]    For questions or updates, please contact [place] Please consult [email or web address] for contact info under    Signed, Rosaria Ferries, PA-C  10/25/2021 12:14 PM

## 2021-10-25 NOTE — Progress Notes (Signed)
ANTICOAGULATION CONSULT NOTE - Follow Up Consult  Pharmacy Consult for heparin Indication: atrial fibrillation  Labs: Recent Labs    10/24/21 1044 10/25/21 0213  HGB 12.8  --   HCT 40.6  --   PLT 358  --   HEPARINUNFRC  --  <0.10*  CREATININE 1.34*  --     Assessment: 88yo female subtherapeutic on heparin with initial dosing for Afib; no infusion issues or signs of bleeding per RN.  Goal of Therapy:  Heparin level 0.3-0.7 units/ml   Plan:  Will rebolus with heparin 1000 units and increase heparin infusion by 4 units/kg/hr to 950 units/hr and check level in 8 hours.    [name], PharmD, BCPS  10/25/2021,3:18 AM

## 2021-10-25 NOTE — Progress Notes (Addendum)
PROGRESS NOTE    [name]  HWK:[id number] DOB: [birth year] DOA: 10/24/2021 PCP: Alroy Dust, L.Marlou Sa, MD     Brief Narrative:   From admission h and p [name] is a 88 y.o. female with medical history significant of anemia, GERD, HTN, constipation, sinus tachycardia who presented to ED with complaints of N/V and stomach pain. She states her pain started yesterday. [name] has only had one episode of vomiting, but has been nauseated. Pain in middle of her stomach. Pain was 9/10 and didn't radiate. Pain described as sharp and worse with eating or drinking. Pain is better now, rated as a 6/10. She denies any chest pain or palpitations. She is not short of breath and has had no coughing. No fever/chills. She was found to be hypoxic on room air at 86%.    She does not smoke or drink alcohol.    Denies any fever/chills, vision changes/headaches, chest pain or palpitations, shortness of breath or cough,no diarrhea,  dysuria or leg swelling.    Assessment & Plan:   Principal Problem:   Acute respiratory failure with hypoxia (HCC) Active Problems:   Paroxysmal atrial fibrillation (HCC)   Sepsis likely from pneumonia/parainfluenza    AKI (acute kidney injury) ([place])   Abdominal pain   Sinus tachycardia   Chronic pain syndrome   GERD (gastroesophageal reflux disease)  # Abdominal pain # Nausea/vomiting With nausea and one episode vomiting. The main reason she came to the ED. CT of c/a/p shows large hiatal hernia which may contribute. Also has moderate constipation. Normal lipase. No report of diarrhea. Abd pain improved with BM this morning but rn notes some diarrhea - continue prn antiemetics - if worsening nausea/vomiting consider gen surg consult for hiatal hernia - stool pcr, c diff if diarrhea continues  # Viral pneumonia # Acute hypoxic respiratory failure # Sepsis Patient denies cough or sob but found to be hypoxic on arrival. CTA without signs PE but does show RLL  consolidation (infiltrate vs atelectasis) and nodular opacities right upper and middle lobes which may be infectious. Procal is low. Respiratory panel positive for parainfluenza virus. Covid/flu neg. Sepsis by hr, leukocytosis - will hold on abx - continue [place] O2, wean as able - would re-institute abx if respiratory status deteriorates  # Atrial fibrillation? Here HR in low 100s. EKG equivocal for a fib, appears so on tele. Heparin started overnight. Chads2vasc is 3. - maintain heparin for now - cont home metop - cardiology consulted - TTE pending - maintain tele  # AKI Cr 1.34 on arrival in setting of above processes, likely prerenal as has improved with IVF - hold IVF and monitor - hold home lasix for now   # Chronic pain syndrome Patient on MS contin 1110m BID. Confirmed on her SNF medication  Will continue this for now, no additional pain meds    # GERD (gastroesophageal reflux disease) Continue PPI and carafate      DVT prophylaxis: therapeutic heparin Code Status: DNR Family Communication: none at bedside. No answer when son called  Level of care: Progressive Status is: inpt The patient will require care spanning > 2 midnights and should be moved to inpatient because: severity of illness    Consultants:  cardiology  Procedures: none  Antimicrobials:  S/p unasyn, ceftriaxone/azithromycin    Subjective: This morning abd pain generalized and improved. Just had bm. Nauseaus, no vomiting. No cough or sob  Objective: Vitals:   10/25/21 0415 10/25/21 0445 10/25/21 0515 10/25/21 0700  BP: 115/62 109/61 109/62 106/62  Pulse: 83 82 85 (!) 47  Resp: 13 18 17  (!) 27  Temp:      SpO2: 94% 95% 94% 95%    Intake/Output Summary (Last 24 hours) at 10/25/2021 0854 Last data filed at 10/24/2021 1715 Gross per 24 hour  Intake 100.17 ml  Output 800 ml  Net -699.83 ml   There were no vitals filed for this visit.  Examination:  General exam: Appears calm and  comfortable  Respiratory system: Clear to auscultation save for rales at bases Cardiovascular system: S1 & S2 heard, tachycardic. Mild systolic murmur. Gastrointestinal system: Abdomen is nondistended, soft and mildly tender throughout. No organomegaly or masses felt. Normal bowel sounds heard. Central nervous system: Alert and oriented. No focal neurological deficits. Extremities: Symmetric 5 x 5 power. Trace LE edema Skin: No rashes, lesions or ulcers Psychiatry: Judgement and insight appear normal. Mood & affect appropriate.     Data Reviewed: I have personally reviewed following labs and imaging studies  CBC: Recent Labs  Lab 10/24/21 1044  WBC 14.7*  HGB 12.8  HCT 40.6  MCV 97.1  PLT 832   Basic Metabolic Panel: Recent Labs  Lab 10/24/21 1044 10/25/21 0213  NA 137 139  K 4.0 3.3*  CL 95* 100  CO2 25 23  GLUCOSE 121* 100*  BUN 28* 25*  CREATININE 1.34* 1.08*  CALCIUM 9.4 8.4*   GFR: CrCl cannot be calculated (Unknown ideal weight.). Liver Function Tests: Recent Labs  Lab 10/24/21 1044  AST 21  ALT 9  ALKPHOS 48  BILITOT 0.7  PROT 7.1  ALBUMIN 3.4*   Recent Labs  Lab 10/24/21 1044  LIPASE 19   No results for input(s): "AMMONIA" in the last 168 hours. Coagulation Profile: No results for input(s): "INR", "PROTIME" in the last 168 hours. Cardiac Enzymes: No results for input(s): "CKTOTAL", "CKMB", "CKMBINDEX", "TROPONINI" in the last 168 hours. BNP (last 3 results) No results for input(s): "PROBNP" in the last 8760 hours. HbA1C: No results for input(s): "HGBA1C" in the last 72 hours. CBG: No results for input(s): "GLUCAP" in the last 168 hours. Lipid Profile: No results for input(s): "CHOL", "HDL", "LDLCALC", "TRIG", "CHOLHDL", "LDLDIRECT" in the last 72 hours. Thyroid Function Tests: Recent Labs    10/24/21 1800  TSH 1.702   Anemia Panel: No results for input(s): "VITAMINB12", "FOLATE", "FERRITIN", "TIBC", "IRON", "RETICCTPCT" in the last 72  hours. Urine analysis:    Component Value Date/Time   COLORURINE YELLOW 10/24/2021 1044   APPEARANCEUR CLEAR 10/24/2021 1044   LABSPEC 1.014 10/24/2021 1044   PHURINE 5.0 10/24/2021 1044   GLUCOSEU NEGATIVE 10/24/2021 1044   HGBUR NEGATIVE 10/24/2021 1044   BILIRUBINUR NEGATIVE 10/24/2021 1044   KETONESUR 5 (A) 10/24/2021 1044   PROTEINUR NEGATIVE 10/24/2021 1044   UROBILINOGEN 0.2 01/26/2014 2329   NITRITE NEGATIVE 10/24/2021 1044   LEUKOCYTESUR TRACE (A) 10/24/2021 1044   Sepsis Labs: @LABRCNTIP (procalcitonin:4,lacticidven:4)  ) Recent Results (from the past 240 hour(s))  Resp Panel by RT-PCR (Flu A&B, Covid) Anterior Nasal Swab     Status: None   Collection Time: 10/24/21 12:54 PM   Specimen: Anterior Nasal Swab  Result Value Ref Range Status   SARS Coronavirus 2 by RT PCR NEGATIVE NEGATIVE Final    Comment: (NOTE) SARS-CoV-2 target nucleic acids are NOT DETECTED.  The SARS-CoV-2 RNA is generally detectable in upper respiratory specimens during the acute phase of infection. The lowest concentration of SARS-CoV-2 viral copies this assay can detect is  138 copies/mL. A negative result does not preclude SARS-Cov-2 infection and should not be used as the sole basis for treatment or other patient management decisions. A negative result may occur with  improper specimen collection/handling, submission of specimen other than nasopharyngeal swab, presence of viral mutation(s) within the areas targeted by this assay, and inadequate number of viral copies(<138 copies/mL). A negative result must be combined with clinical observations, patient history, and epidemiological information. The expected result is Negative.  Fact Sheet for Patients:  EntrepreneurPulse.com.au  Fact Sheet for Healthcare Providers:  IncredibleEmployment.be  This test is no t yet approved or cleared by the [place] FDA and  has been authorized for detection and/or  diagnosis of SARS-CoV-2 by FDA under an Emergency Use Authorization (EUA). This EUA will remain  in effect (meaning this test can be used) for the duration of the COVID-19 declaration under Section 564(b)(1) of the Act, 21 U.S.C.section 360bbb-3(b)(1), unless the authorization is terminated  or revoked sooner.       Influenza A by PCR NEGATIVE NEGATIVE Final   Influenza B by PCR NEGATIVE NEGATIVE Final    Comment: (NOTE) The Xpert Xpress SARS-CoV-2/FLU/RSV plus assay is intended as an aid in the diagnosis of influenza from Nasopharyngeal swab specimens and should not be used as a sole basis for treatment. Nasal washings and aspirates are unacceptable for Xpert Xpress SARS-CoV-2/FLU/RSV testing.  Fact Sheet for Patients: EntrepreneurPulse.com.au  Fact Sheet for Healthcare Providers: IncredibleEmployment.be  This test is not yet approved or cleared by the [place] FDA and has been authorized for detection and/or diagnosis of SARS-CoV-2 by FDA under an Emergency Use Authorization (EUA). This EUA will remain in effect (meaning this test can be used) for the duration of the COVID-19 declaration under Section 564(b)(1) of the Act, 21 U.S.C. section 360bbb-3(b)(1), unless the authorization is terminated or revoked.  Performed at [facility], [place] [address], [place], [place] [address]   Respiratory (~20 pathogens) panel by PCR     Status: Abnormal   Collection Time: 10/24/21  5:16 PM   Specimen: Nasopharyngeal Swab; Respiratory  Result Value Ref Range Status   Adenovirus NOT DETECTED NOT DETECTED Final   Coronavirus 229E NOT DETECTED NOT DETECTED Final    Comment: (NOTE) The Coronavirus on the Respiratory Panel, DOES NOT test for the novel  Coronavirus (2019 nCoV)    Coronavirus HKU1 NOT DETECTED NOT DETECTED Final   Coronavirus NL63 NOT DETECTED NOT DETECTED Final   Coronavirus OC43 NOT DETECTED NOT DETECTED Final    Metapneumovirus NOT DETECTED NOT DETECTED Final   Rhinovirus / Enterovirus NOT DETECTED NOT DETECTED Final   Influenza A NOT DETECTED NOT DETECTED Final   Influenza B NOT DETECTED NOT DETECTED Final   Parainfluenza Virus 1 NOT DETECTED NOT DETECTED Final   Parainfluenza Virus 2 NOT DETECTED NOT DETECTED Final   Parainfluenza Virus 3 DETECTED (A) NOT DETECTED Final   Parainfluenza Virus 4 NOT DETECTED NOT DETECTED Final   Respiratory Syncytial Virus NOT DETECTED NOT DETECTED Final   Bordetella pertussis NOT DETECTED NOT DETECTED Final   Bordetella Parapertussis NOT DETECTED NOT DETECTED Final   Chlamydophila pneumoniae NOT DETECTED NOT DETECTED Final   Mycoplasma pneumoniae NOT DETECTED NOT DETECTED Final    Comment: Performed at [facility], [place]. [address], [place], [place] [address]         Radiology Studies: CT Angio Chest PE W and/or Wo Contrast  Result Date: 10/24/2021 CLINICAL DATA:  Pulmonary embolism suspected,  high probability. EXAM: CT ANGIOGRAPHY CHEST WITH CONTRAST TECHNIQUE: Multidetector CT imaging of the chest was performed using the standard protocol during bolus administration of intravenous contrast. Multiplanar CT image reconstructions and MIPs were obtained to evaluate the vascular anatomy. RADIATION DOSE REDUCTION: This exam was performed according to the departmental dose-optimization program which includes automated exposure control, adjustment of the mA and/or kV according to patient size and/or use of iterative reconstruction technique. CONTRAST:  49m OMNIPAQUE IOHEXOL 350 MG/ML SOLN COMPARISON:  09/06/2010. FINDINGS: Cardiovascular: The heart is mildly enlarged and there is no pericardial effusion. Scattered coronary artery calcifications are noted. There is atherosclerotic calcification of the aorta without evidence of aneurysm. The pulmonary trunk is normal in caliber. No definite pulmonary artery filling defect. Mediastinum/Nodes: Multiple enlarged lymph  nodes are present in the mediastinum measuring up to 1.8 cm in the paratracheal space on the left. Hilar lymphadenopathy is present bilaterally. No axillary lymphadenopathy. The thyroid gland and trachea are within normal limits. The visualized esophagus is within normal limits. There is a large hiatal hernia with the stomach intrathoracic in location. Lungs/Pleura: There is consolidation in the right lower lobe and patchy airspace disease at the left lung base. Tree-in-bud nodular densities are noted in the right upper and middle lobes. No effusion or pneumothorax. Upper Abdomen: No acute abnormality. There is reflux of contrast into the inferior vena cava and hepatic veins suggesting right heart failure. Musculoskeletal: Degenerative changes are present in the thoracic spine. Laminectomy changes and spinal fusion hardware is noted in the upper lumbar spine. No acute fracture is seen. Bilateral breast implants are noted. Review of the MIP images confirms the above findings. IMPRESSION: 1. No evidence of pulmonary embolism. 2. Consolidation in the right lower lobe with patchy airspace disease in the left lower lobe, possible atelectasis or infiltrate. Follow-up is recommended until resolution to exclude the possibility of underlying neoplastic process. 3. Scattered tree-in-bud nodular opacities in the right upper and middle lobes which may be infectious or inflammatory. 4. Mediastinal lymphadenopathy, which may be reactive. 5. Cardiomegaly with coronary artery calcifications. 6. Aortic atherosclerosis. 7. Large hiatal hernia. Electronically Signed   By: LBrett FairyM.D.   On: 10/24/2021 21:35   CT ABDOMEN PELVIS W CONTRAST  Result Date: 10/24/2021 CLINICAL DATA:  Nausea/vomiting Abdominal pain, acute, nonlocalized EXAM: CT ABDOMEN AND PELVIS WITH CONTRAST TECHNIQUE: Multidetector CT imaging of the abdomen and pelvis was performed using the standard protocol following bolus administration of intravenous  contrast. RADIATION DOSE REDUCTION: This exam was performed according to the departmental dose-optimization program which includes automated exposure control, adjustment of the mA and/or kV according to patient size and/or use of iterative reconstruction technique. CONTRAST:  68mOMNIPAQUE IOHEXOL 300 MG/ML  SOLN COMPARISON:  CT abdomen pelvis 03/28/2016 FINDINGS: Lower chest: Elevated right hemidiaphragm. Right basilar consolidation. Additional tree-in-bud opacities in the right middle lobe, lingula, and left lower lobe. Mild bronchiectasis. Right middle lobe atelectasis. Bilateral breast implants with calcified capsules. Hepatobiliary: Unchanged hepatic cysts. The gallbladder is unremarkable. Pancreas: Unremarkable. No pancreatic ductal dilatation or surrounding inflammatory changes. Spleen: Unchanged lobulation of the spleen with areas of scarring and evidence of of prior infarcts. No new splenic abnormality. Adrenals/Urinary Tract: Adrenal glands are unremarkable. No hydronephrosis or nephrolithiasis. Unchanged bilateral renal cyst. No follow-up imaging is recommended. There is marked distension of the urinary bladder. Stomach/Bowel: Large hiatal hernia with intrathoracic stomach. There is no evidence of bowel obstruction.Prior appendectomy. Mild-to-moderate stool burden. Scattered colonic diverticula. Vascular/Lymphatic: Aortoiliac atherosclerosis. There is a posterior  mediastinal enlarged lymph node measuring up to 1.2 cm short axis (series 3, image 1), likely reactive. Reproductive: Prior hysterectomy. Other: Loss of body fat in comparison prior exam in 2017. No hernia. No significant ascites. No free air. Musculoskeletal: Osteopenia. No acute osseous abnormality. Unchanged lumbosacral fusion hardware without evidence of complication. Multilevel degenerative changes of the spine. Unchanged spinal alignment. IMPRESSION: Right lower lobe pneumonia with additional tree-in-bud opacities in the middle lobe,  lingula, and left lower lobe, potentially aspiration-related. Large hiatal hernia with intrathoracic stomach. Mild-to-moderate stool burden suggesting constipation. Marked distension of the urinary bladder. Electronically Signed   By: Maurine Simmering M.D.   On: 10/24/2021 14:24   DG Chest 2 View  Result Date: 10/24/2021 CLINICAL DATA:  Chest pain, nausea and abdominal pain. EXAM: CHEST - 2 VIEW COMPARISON:  01/09/2021 FINDINGS: Large hiatal hernia is again noted. Stable cardiomediastinal contours. No pleural effusion or edema. Chronic asymmetric elevation of the right hemidiaphragm. Mild atelectasis noted within the right base. No airspace consolidation identified. Spondylosis noted within the lower thoracic spine. Capsular calcification of bilateral breast implants noted IMPRESSION: Chronic asymmetric elevation of the right hemidiaphragm with right base atelectasis. Large hiatal hernia. Aortic Atherosclerosis (ICD10-I70.0). Electronically Signed   By: Kerby Moors M.D.   On: 10/24/2021 11:25        Scheduled Meds:  aspirin EC  81 mg Oral QHS   chlorhexidine gluconate (MEDLINE KIT)  15 mL Mouth/Throat BID   famotidine  20 mg Oral QHS   ferrous sulfate  325 mg Oral Q breakfast   melatonin  3 mg Oral QHS   metoprolol tartrate  50 mg Oral BID   morphine  100 mg Oral Q12H   pantoprazole  40 mg Oral Daily   sertraline  25 mg Oral Daily   sodium chloride flush  3 mL Intravenous Q12H   sucralfate  1 g Oral TID AC   Continuous Infusions:  sodium chloride     azithromycin Stopped (10/24/21 2340)   cefTRIAXone (ROCEPHIN)  IV Stopped (10/24/21 2235)   heparin 950 Units/hr (10/25/21 0333)     LOS: 0 days     Desma Maxim, MD [facility]   If 7PM-7AM, please contact night-coverage [email or web address] Password [facility] 10/25/2021, 8:54 AM

## 2021-10-26 ENCOUNTER — Encounter (HOSPITAL_COMMUNITY): Payer: Self-pay | Admitting: Obstetrics and Gynecology

## 2021-10-26 DIAGNOSIS — I4891 Unspecified atrial fibrillation: Secondary | ICD-10-CM | POA: Diagnosis not present

## 2021-10-26 DIAGNOSIS — I10 Essential (primary) hypertension: Secondary | ICD-10-CM | POA: Diagnosis not present

## 2021-10-26 DIAGNOSIS — R000 Tachycardia, unspecified: Secondary | ICD-10-CM

## 2021-10-26 DIAGNOSIS — G894 Chronic pain syndrome: Secondary | ICD-10-CM

## 2021-10-26 DIAGNOSIS — J9601 Acute respiratory failure with hypoxia: Secondary | ICD-10-CM | POA: Diagnosis not present

## 2021-10-26 LAB — GASTROINTESTINAL PANEL BY PCR, STOOL (REPLACES STOOL CULTURE)

## 2021-10-26 LAB — BASIC METABOLIC PANEL
Anion gap: 14 (ref 5–15)
BUN: 27 mg/dL — ABNORMAL HIGH (ref 8–23)
CO2: 22 mmol/L (ref 22–32)
Calcium: 9 mg/dL (ref 8.9–10.3)
Chloride: 103 mmol/L (ref 98–111)
Creatinine, Ser: 1.04 mg/dL — ABNORMAL HIGH (ref 0.44–1.00)
GFR, Estimated: 52 mL/min — ABNORMAL LOW (ref >60)
Glucose, Bld: 99 mg/dL (ref 70–99)
Potassium: 4.1 mmol/L (ref 3.5–5.1)
Sodium: 139 mmol/L (ref 135–145)

## 2021-10-26 LAB — CBC
HCT: 37.8 % (ref 36.0–46.0)
Hemoglobin: 12.3 g/dL (ref 12.0–15.0)
MCH: 31.3 pg (ref 26.0–34.0)
MCHC: 32.5 g/dL (ref 30.0–36.0)
MCV: 96.2 fL (ref 80.0–100.0)
Platelets: 334 10*3/uL (ref 150–400)
RBC: 3.93 MIL/uL (ref 3.87–5.11)
RDW: 14.3 % (ref 11.5–15.5)
WBC: 7.2 10*3/uL (ref 4.0–10.5)
nRBC: 0 % (ref 0.0–0.2)

## 2021-10-26 LAB — LEGIONELLA PNEUMOPHILA SEROGP 1 UR AG: L. pneumophila Serogp 1 Ur Ag: NEGATIVE

## 2021-10-26 NOTE — Progress Notes (Addendum)
Progress Note  Patient Name: [name] Date of Encounter: 10/26/2021  [facility] Cardiologist: Berniece Salines, DO new  Subjective   Feels a little better today, promises to drink more water. No presyncope or syncope, no chest pain  Inpatient Medications    Scheduled Meds:  chlorhexidine gluconate (MEDLINE KIT)  15 mL Mouth/Throat BID   Chlorhexidine Gluconate Cloth  6 each Topical Q0600   enoxaparin (LOVENOX) injection  40 mg Subcutaneous Q24H   famotidine  20 mg Oral QHS   ferrous sulfate  325 mg Oral Q breakfast   melatonin  3 mg Oral QHS   metoprolol tartrate  50 mg Oral BID   morphine  100 mg Oral Q12H   mupirocin ointment  1 Application Nasal BID   pantoprazole  40 mg Oral Daily   sertraline  25 mg Oral Daily   sodium chloride flush  3 mL Intravenous Q12H   sucralfate  1 g Oral TID AC   Continuous Infusions:  sodium chloride     PRN Meds: sodium chloride, acetaminophen **OR** acetaminophen, metoCLOPramide (REGLAN) injection, ondansetron **OR** ondansetron (ZOFRAN) IV, mouth rinse, polyethylene glycol, sodium chloride flush   Vital Signs    Vitals:   10/25/21 1730 10/25/21 1800 10/25/21 1935 10/26/21 0023  BP: 128/65 (!) 108/56 124/78 (!) 101/53  Pulse: 68 98 (!) 107 87  Resp: (!) 21 18 18 15   Temp: 98.2 F (36.8 C) 98.4 F (36.9 C) 98 F (36.7 C) 98 F (36.7 C)  TempSrc: Oral Oral Oral Oral  SpO2: 91% 94% 95% 95%    Intake/Output Summary (Last 24 hours) at 10/26/2021 0830 Last data filed at 10/25/2021 1935 Gross per 24 hour  Intake 120 ml  Output --  Net 120 ml      01/09/2021    9:14 PM 08/25/2016    1:24 PM 07/28/2016   11:57 AM  Last 3 Weights  Weight (lbs) 130 lb 116 lb 6.4 oz 120 lb 9.6 oz  Weight (kg) 58.968 kg 52.799 kg 54.704 kg      Telemetry    SR w/ FREQUENT PACs and PVCs - Personally Reviewed  ECG    Recheck pending - Personally Reviewed  Physical Exam   GEN: frail, elderly female,No acute distress.   Neck: mild  JVD Cardiac: Irreg R&R, no murmurs, rubs, or gallops.  Respiratory: Rales bases bilaterally. GI: Soft, nontender, non-distended  MS: No edema; No deformity. Neuro:  Nonfocal  Psych: Normal affect   Labs    High Sensitivity Troponin:  No results for input(s): "TROPONINIHS" in the last 720 hours.   Chemistry Recent Labs  Lab 10/24/21 1044 10/25/21 0213 10/26/21 0155  NA 137 139 139  K 4.0 3.3* 4.1  CL 95* 100 103  CO2 25 23 22   GLUCOSE 121* 100* 99  BUN 28* 25* 27*  CREATININE 1.34* 1.08* 1.04*  CALCIUM 9.4 8.4* 9.0  PROT 7.1  --   --   ALBUMIN 3.4*  --   --   AST 21  --   --   ALT 9  --   --   ALKPHOS 48  --   --   BILITOT 0.7  --   --   GFRNONAA 38* 50* 52*  ANIONGAP 17* 16* 14    Lipids No results for input(s): "CHOL", "TRIG", "HDL", "LABVLDL", "LDLCALC", "CHOLHDL" in the last 168 hours.  Hematology Recent Labs  Lab 10/24/21 1044 10/26/21 0155  WBC 14.7* 7.2  RBC 4.18 3.93  HGB 12.8 12.3  HCT 40.6 37.8  MCV 97.1 96.2  MCH 30.6 31.3  MCHC 31.5 32.5  RDW 14.6 14.3  PLT 358 334   Thyroid  Recent Labs  Lab 10/24/21 1800  TSH 1.702    BNP Recent Labs  Lab 10/24/21 1716  BNP 524.3*    DDimer  Recent Labs  Lab 10/24/21 1715  DDIMER 0.78*     Radiology    ECHOCARDIOGRAM COMPLETE  Result Date: 10/25/2021    ECHOCARDIOGRAM REPORT   Patient Name:   [name] Date of Exam: 10/25/2021 Medical Rec #:  [id number]      Height:       63.0 in Accession #:    [id number]     Weight:       130.0 lb Date of Birth:  05-31-1932      BSA:          1.610 m Patient Age:    88 years       BP:           118/60 mmHg Patient Gender: F              HR:           80 bpm. Exam Location:  Inpatient Procedure: 2D Echo, Color Doppler and Cardiac Doppler Indications:    hypoxia  History:        Patient has no prior history of Echocardiogram examinations.                 Arrythmias:Tachycardia and Atrial Fibrillation.  Sonographer:    Melissa Morford RDCS (AE, PE) Sonographer#2:   Ronny Flurry Referring Phys: [id number] Orma Flaming  Sonographer Comments: Suboptimal apical window. IMPRESSIONS  1. Left ventricular ejection fraction, by estimation, is 55 to 60%. The left ventricle has normal function. Left ventricular endocardial border not optimally defined to evaluate regional wall motion. There is mild left ventricular hypertrophy. Left ventricular diastolic parameters are consistent with Grade I diastolic dysfunction (impaired relaxation).  2. Right ventricular systolic function is normal. The right ventricular size is normal. Tricuspid regurgitation signal is inadequate for assessing PA pressure.  3. The mitral valve is degenerative. No evidence of mitral valve regurgitation. Moderate mitral annular calcification.  4. The aortic valve was not well visualized. Aortic valve regurgitation is not visualized. No aortic stenosis is present.  5. The inferior vena cava is normal in size with greater than 50% respiratory variability, suggesting right atrial pressure of 3 mmHg. FINDINGS  Left Ventricle: Left ventricular ejection fraction, by estimation, is 55 to 60%. The left ventricle has normal function. Left ventricular endocardial border not optimally defined to evaluate regional wall motion. The left ventricular internal cavity size was small. There is mild left ventricular hypertrophy. Left ventricular diastolic parameters are consistent with Grade I diastolic dysfunction (impaired relaxation). Right Ventricle: The right ventricular size is normal. No increase in right ventricular wall thickness. Right ventricular systolic function is normal. Tricuspid regurgitation signal is inadequate for assessing PA pressure. Left Atrium: Left atrial size was normal in size. Right Atrium: Right atrial size was normal in size. Pericardium: Trivial pericardial effusion is present. Mitral Valve: The mitral valve is degenerative in appearance. Moderate mitral annular calcification. No evidence of mitral  valve regurgitation. Tricuspid Valve: The tricuspid valve is normal in structure. Tricuspid valve regurgitation is trivial. Aortic Valve: The aortic valve was not well visualized. Aortic valve regurgitation is not visualized. No aortic stenosis is present. Pulmonic Valve: The pulmonic valve was  not well visualized. Pulmonic valve regurgitation is not visualized. Aorta: The aortic root and ascending aorta are structurally normal, with no evidence of dilitation. Venous: The inferior vena cava is normal in size with greater than 50% respiratory variability, suggesting right atrial pressure of 3 mmHg. IAS/Shunts: The interatrial septum was not well visualized.  LEFT VENTRICLE PLAX 2D LVOT diam:     2.00 cm   Diastology LV SV:         27        LV e' lateral:   5.87 cm/s LV SV Index:   17        LV E/e' lateral: 9.0 LVOT Area:     3.14 cm  LEFT ATRIUM           Index        RIGHT ATRIUM          Index LA Vol (A2C): 23.7 ml 14.72 ml/m  RA Area:     5.79 cm LA Vol (A4C): 17.9 ml 11.12 ml/m  RA Volume:   8.16 ml  5.07 ml/m  AORTIC VALVE LVOT Vmax:   50.30 cm/s LVOT Vmean:  32.600 cm/s LVOT VTI:    0.087 m  AORTA Ao Root diam: 3.10 cm Ao Asc diam:  3.30 cm MITRAL VALVE MV Area (PHT): 6.22 cm     SHUNTS MV Decel Time: 122 msec     Systemic VTI:  0.09 m MV E velocity: 52.70 cm/s   Systemic Diam: 2.00 cm MV A velocity: 105.00 cm/s MV E/A ratio:  0.50 Oswaldo Milian MD Electronically signed by Oswaldo Milian MD Signature Date/Time: 10/25/2021/2:10:56 PM    Final    CT Angio Chest PE W and/or Wo Contrast  Result Date: 10/24/2021 CLINICAL DATA:  Pulmonary embolism suspected, high probability. EXAM: CT ANGIOGRAPHY CHEST WITH CONTRAST TECHNIQUE: Multidetector CT imaging of the chest was performed using the standard protocol during bolus administration of intravenous contrast. Multiplanar CT image reconstructions and MIPs were obtained to evaluate the vascular anatomy. RADIATION DOSE REDUCTION: This exam was  performed according to the departmental dose-optimization program which includes automated exposure control, adjustment of the mA and/or kV according to patient size and/or use of iterative reconstruction technique. CONTRAST:  70m OMNIPAQUE IOHEXOL 350 MG/ML SOLN COMPARISON:  09/06/2010. FINDINGS: Cardiovascular: The heart is mildly enlarged and there is no pericardial effusion. Scattered coronary artery calcifications are noted. There is atherosclerotic calcification of the aorta without evidence of aneurysm. The pulmonary trunk is normal in caliber. No definite pulmonary artery filling defect. Mediastinum/Nodes: Multiple enlarged lymph nodes are present in the mediastinum measuring up to 1.8 cm in the paratracheal space on the left. Hilar lymphadenopathy is present bilaterally. No axillary lymphadenopathy. The thyroid gland and trachea are within normal limits. The visualized esophagus is within normal limits. There is a large hiatal hernia with the stomach intrathoracic in location. Lungs/Pleura: There is consolidation in the right lower lobe and patchy airspace disease at the left lung base. Tree-in-bud nodular densities are noted in the right upper and middle lobes. No effusion or pneumothorax. Upper Abdomen: No acute abnormality. There is reflux of contrast into the inferior vena cava and hepatic veins suggesting right heart failure. Musculoskeletal: Degenerative changes are present in the thoracic spine. Laminectomy changes and spinal fusion hardware is noted in the upper lumbar spine. No acute fracture is seen. Bilateral breast implants are noted. Review of the MIP images confirms the above findings. IMPRESSION: 1. No evidence of pulmonary embolism. 2. Consolidation in the  right lower lobe with patchy airspace disease in the left lower lobe, possible atelectasis or infiltrate. Follow-up is recommended until resolution to exclude the possibility of underlying neoplastic process. 3. Scattered tree-in-bud  nodular opacities in the right upper and middle lobes which may be infectious or inflammatory. 4. Mediastinal lymphadenopathy, which may be reactive. 5. Cardiomegaly with coronary artery calcifications. 6. Aortic atherosclerosis. 7. Large hiatal hernia. Electronically Signed   By: Brett Fairy M.D.   On: 10/24/2021 21:35   CT ABDOMEN PELVIS W CONTRAST  Result Date: 10/24/2021 CLINICAL DATA:  Nausea/vomiting Abdominal pain, acute, nonlocalized EXAM: CT ABDOMEN AND PELVIS WITH CONTRAST TECHNIQUE: Multidetector CT imaging of the abdomen and pelvis was performed using the standard protocol following bolus administration of intravenous contrast. RADIATION DOSE REDUCTION: This exam was performed according to the departmental dose-optimization program which includes automated exposure control, adjustment of the mA and/or kV according to patient size and/or use of iterative reconstruction technique. CONTRAST:  35m OMNIPAQUE IOHEXOL 300 MG/ML  SOLN COMPARISON:  CT abdomen pelvis 03/28/2016 FINDINGS: Lower chest: Elevated right hemidiaphragm. Right basilar consolidation. Additional tree-in-bud opacities in the right middle lobe, lingula, and left lower lobe. Mild bronchiectasis. Right middle lobe atelectasis. Bilateral breast implants with calcified capsules. Hepatobiliary: Unchanged hepatic cysts. The gallbladder is unremarkable. Pancreas: Unremarkable. No pancreatic ductal dilatation or surrounding inflammatory changes. Spleen: Unchanged lobulation of the spleen with areas of scarring and evidence of of prior infarcts. No new splenic abnormality. Adrenals/Urinary Tract: Adrenal glands are unremarkable. No hydronephrosis or nephrolithiasis. Unchanged bilateral renal cyst. No follow-up imaging is recommended. There is marked distension of the urinary bladder. Stomach/Bowel: Large hiatal hernia with intrathoracic stomach. There is no evidence of bowel obstruction.Prior appendectomy. Mild-to-moderate stool burden.  Scattered colonic diverticula. Vascular/Lymphatic: Aortoiliac atherosclerosis. There is a posterior mediastinal enlarged lymph node measuring up to 1.2 cm short axis (series 3, image 1), likely reactive. Reproductive: Prior hysterectomy. Other: Loss of body fat in comparison prior exam in 2017. No hernia. No significant ascites. No free air. Musculoskeletal: Osteopenia. No acute osseous abnormality. Unchanged lumbosacral fusion hardware without evidence of complication. Multilevel degenerative changes of the spine. Unchanged spinal alignment. IMPRESSION: Right lower lobe pneumonia with additional tree-in-bud opacities in the middle lobe, lingula, and left lower lobe, potentially aspiration-related. Large hiatal hernia with intrathoracic stomach. Mild-to-moderate stool burden suggesting constipation. Marked distension of the urinary bladder. Electronically Signed   By: JMaurine SimmeringM.D.   On: 10/24/2021 14:24   DG Chest 2 View  Result Date: 10/24/2021 CLINICAL DATA:  Chest pain, nausea and abdominal pain. EXAM: CHEST - 2 VIEW COMPARISON:  01/09/2021 FINDINGS: Large hiatal hernia is again noted. Stable cardiomediastinal contours. No pleural effusion or edema. Chronic asymmetric elevation of the right hemidiaphragm. Mild atelectasis noted within the right base. No airspace consolidation identified. Spondylosis noted within the lower thoracic spine. Capsular calcification of bilateral breast implants noted IMPRESSION: Chronic asymmetric elevation of the right hemidiaphragm with right base atelectasis. Large hiatal hernia. Aortic Atherosclerosis (ICD10-I70.0). Electronically Signed   By: TKerby MoorsM.D.   On: 10/24/2021 11:25    Cardiac Studies   ECHO: 10/25/2021  1. Left ventricular ejection fraction, by estimation, is 55 to 60%. The  left ventricle has normal function. Left ventricular endocardial border  not optimally defined to evaluate regional wall motion. There is mild left ventricular hypertrophy.  Left ventricular diastolic parameters are consistent with Grade I diastolic dysfunction (impaired relaxation).   2. Right ventricular systolic function is normal. The right ventricular  size  is normal. Tricuspid regurgitation signal is inadequate for assessing PA pressure.   3. The mitral valve is degenerative. No evidence of mitral valve  regurgitation. Moderate mitral annular calcification.   4. The aortic valve was not well visualized. Aortic valve regurgitation  is not visualized. No aortic stenosis is present.   5. The inferior vena cava is normal in size with greater than 50%  respiratory variability, suggesting right atrial pressure of 3 mmHg.   Patient Profile     88 y.o. female with a hx of anemia, GERD, HTN, constipation, sinus tachycardia in the setting of sepsis, was admitted 07/10 with hypoxic resp failure, +parainfluenza PNA, sepsis, ?afib >> Cards saw  Assessment & Plan    ?atrial fib/flutter - repeat ECG pending to eval for flutter, but appears all is sinus - irreg HR is probably chronic for her, elevated rate in setting of acute illness - telemetry reviewed and pt has much artifact, making eval difficult, but when complexes are clearly visible, it is SR/PACs - MD advise if we sign off today  2. Acute resp illness and other issues, per IM   For questions or updates, please contact [place] Please consult [email or web address] for contact info under      Signed, Rosaria Ferries, PA-C  10/26/2021, 8:30 AM     Questionable atrial fibrillation-EKG as well as telemetry does not show any evidence of atrial fibrillation.  She does have frequent PACs.  No need for any anticoagulation at this time.  [facility] will sign off.   Medication Recommendations:  none  Other recommendations (labs, testing, etc):  none  Follow up as an outpatient:  none

## 2021-10-26 NOTE — Progress Notes (Signed)
PT Cancellation Note  Patient Details Name: [name] MRN: [id number] DOB: [birth year]   Cancelled Treatment:    Reason Eval/Treat Not Completed: PT screened; per Social Worker, patient is long-term care resident at [facility]. No acute PT needs identified, will sign off.  Mabeline Caras, PT, DPT Acute Rehabilitation Services  Personal: [facility] Office: [place] 10/26/2021, 3:14 PM

## 2021-10-26 NOTE — Progress Notes (Signed)
Pt heart rate is going up into the 150s sustaining for 30 seconds to 1  minute then returning to baseline in 100s  Pt asymptomatic  MD notified  EKG on unit unable to pick up signal  Stat EKG order placed

## 2021-10-26 NOTE — Progress Notes (Signed)
[facility]  PROGRESS NOTE  [name] ASN:[id number] DOB: 1932-05-02 DOA: 10/24/2021 PCP: Alroy Dust, L.Marlou Sa, MD   Brief HPI:   88 year old female with medical history of anemia, GERD, hypertension, constipation, sinus tachycardia came to ED with complaints of nausea vomiting and abdominal pain.  Also 1 episode of vomiting complain of pain in the epigastric region.    Subjective   Patient seen and examined, denies diarrhea.  No nausea or vomiting this morning.  C. difficile PCR is negative.   Assessment/Plan:    Nausea/vomiting/abdominal pain -Significantly improved -CT abdomen/pelvis showed large hiatal hernia, also showed moderate constipation.  Lipase was normal. -Stool for C. difficile PCR is negative  Acute hypoxemic respiratory failure -Likely from viral pneumonia -Patient denies shortness of breath found to be hypoxemic on arrival -CT chest was negative for PE but showed right lower lobe consolidation, likely infectious -Respiratory panel was positive for parainfluenza virus; COVID RT-PCR was negative, influenza was negative -Antibiotics were not initiated as procalcitonin 0.18 -Patient started on oxygen -Still requiring oxygen, will wean off oxygen as tolerated  ?  Atrial fibrillation -Patient's EKG showed atrial fibrillation -Cardiology was consulted -Cardiology deemed that patient does not have A-fib as this is likely from artifacts along with PACs and PVCs -Cardiology has discontinued anticoagulation with heparin -Echocardiogram showed EF 55 to 19%; grade 1 diastolic dysfunction -Cardiology has signed off  Acute kidney injury -Resolved -Presented with creatinine of 1.34, improved to 1.04 with IV fluids  Chronic pain syndrome -Patient on MS Contin 100 mg p.o. twice daily  GERD -Continue Protonix, sucralfate  Disposition -Patient is from skilled nursing facility long-term resident -We will discharge once oxygen is weaned off  Medications      chlorhexidine gluconate (MEDLINE KIT)  15 mL Mouth/Throat BID   Chlorhexidine Gluconate Cloth  6 each Topical Q0600   enoxaparin (LOVENOX) injection  40 mg Subcutaneous Q24H   famotidine  20 mg Oral QHS   ferrous sulfate  325 mg Oral Q breakfast   melatonin  3 mg Oral QHS   metoprolol tartrate  50 mg Oral BID   morphine  100 mg Oral Q12H   mupirocin ointment  1 Application Nasal BID   pantoprazole  40 mg Oral Daily   sertraline  25 mg Oral Daily   sodium chloride flush  3 mL Intravenous Q12H   sucralfate  1 g Oral TID AC     Data Reviewed:   CBG:  No results for input(s): "GLUCAP" in the last 168 hours.  SpO2: 96 % O2 Flow Rate (L/min): 2 L/min FiO2 (%): 100 %    Vitals:   10/25/21 1935 10/26/21 0023 10/26/21 0834 10/26/21 1122  BP: 124/78 (!) 101/53 136/64 (!) 120/91  Pulse: (!) 107 87 92 90  Resp: 18 15 (!) 22 17  Temp: 98 F (36.7 C) 98 F (36.7 C) 97.9 F (36.6 C) 97.8 F (36.6 C)  TempSrc: Oral Oral Oral Oral  SpO2: 95% 95% 93% 96%      Data Reviewed:  Basic Metabolic Panel: Recent Labs  Lab 10/24/21 1044 10/25/21 0213 10/26/21 0155  NA 137 139 139  K 4.0 3.3* 4.1  CL 95* 100 103  CO2 25 23 22   GLUCOSE 121* 100* 99  BUN 28* 25* 27*  CREATININE 1.34* 1.08* 1.04*  CALCIUM 9.4 8.4* 9.0    CBC: Recent Labs  Lab 10/24/21 1044 10/26/21 0155  WBC 14.7* 7.2  HGB 12.8 12.3  HCT 40.6 37.8  MCV 97.1  96.2  PLT 358 334    LFT Recent Labs  Lab 10/24/21 1044  AST 21  ALT 9  ALKPHOS 48  BILITOT 0.7  PROT 7.1  ALBUMIN 3.4*     Antibiotics: Anti-infectives (From admission, onward)    Start     Dose/Rate Route Frequency Ordered Stop   10/24/21 2200  azithromycin (ZITHROMAX) 500 mg in sodium chloride 0.9 % 250 mL IVPB  Status:  Discontinued        500 mg 250 mL/hr over 60 Minutes Intravenous Every 24 hours 10/24/21 1926 10/25/21 0906   10/24/21 2200  cefTRIAXone (ROCEPHIN) 1 g in sodium chloride 0.9 % 100 mL IVPB  Status:  Discontinued         1 g 200 mL/hr over 30 Minutes Intravenous Every 24 hours 10/24/21 1926 10/25/21 0906   10/24/21 1600  cefTRIAXone (ROCEPHIN) 1 g in sodium chloride 0.9 % 100 mL IVPB  Status:  Discontinued        1 g 200 mL/hr over 30 Minutes Intravenous  Once 10/24/21 1551 10/24/21 1557   10/24/21 1600  azithromycin (ZITHROMAX) 500 mg in sodium chloride 0.9 % 250 mL IVPB  Status:  Discontinued        500 mg 250 mL/hr over 60 Minutes Intravenous  Once 10/24/21 1551 10/24/21 1557   10/24/21 1600  Ampicillin-Sulbactam (UNASYN) 3 g in sodium chloride 0.9 % 100 mL IVPB        3 g 200 mL/hr over 30 Minutes Intravenous  Once 10/24/21 1557 10/24/21 1715        DVT prophylaxis: Lovenox  Code Status: Full code  Family Communication: No family at bedside   CONSULTS cardiology   Objective    Physical Examination:   General-appears in no acute distress Heart-S1-S2, regular, no murmur auscultated Lungs-clear to auscultation bilaterally, no wheezing or crackles auscultated Abdomen-soft, nontender, no organomegaly Extremities-no edema in the lower extremities Neuro-alert, oriented x3, no focal deficit noted   Status is: Inpatient: Viral pneumonia           [place]   [facility] If 7PM-7AM, please contact night-coverage at [email or web address], Office  [phone]   10/26/2021, 3:12 PM  LOS: 1 day

## 2021-10-26 NOTE — Plan of Care (Signed)
  Problem: Respiratory: Goal: Ability to maintain a clear airway will improve Outcome: Progressing   Problem: Coping: Goal: Level of anxiety will decrease Outcome: Progressing   Problem: Clinical Measurements: Goal: Cardiovascular complication will be avoided Outcome: Not Progressing   Problem: Activity: Goal: Risk for activity intolerance will decrease Outcome: Not Progressing   Problem: Nutrition: Goal: Adequate nutrition will be maintained Outcome: Not Progressing   Problem: Pain Managment: Goal: General experience of comfort will improve Outcome: Not Progressing

## 2021-10-27 DIAGNOSIS — J9601 Acute respiratory failure with hypoxia: Secondary | ICD-10-CM | POA: Diagnosis not present

## 2021-10-27 LAB — COMPREHENSIVE METABOLIC PANEL
ALT: 11 U/L (ref 0–44)
AST: 22 U/L (ref 15–41)
Albumin: 2.6 g/dL — ABNORMAL LOW (ref 3.5–5.0)
Alkaline Phosphatase: 41 U/L (ref 38–126)
Anion gap: 7 (ref 5–15)
BUN: 22 mg/dL (ref 8–23)
CO2: 25 mmol/L (ref 22–32)
Calcium: 8.8 mg/dL — ABNORMAL LOW (ref 8.9–10.3)
Chloride: 105 mmol/L (ref 98–111)
Creatinine, Ser: 0.88 mg/dL (ref 0.44–1.00)
GFR, Estimated: >60 mL/min (ref >60)
Glucose, Bld: 99 mg/dL (ref 70–99)
Potassium: 4.4 mmol/L (ref 3.5–5.1)
Sodium: 137 mmol/L (ref 135–145)
Total Bilirubin: 0.4 mg/dL (ref 0.3–1.2)
Total Protein: 5.7 g/dL — ABNORMAL LOW (ref 6.5–8.1)

## 2021-10-27 LAB — CBC
HCT: 37.1 % (ref 36.0–46.0)
Hemoglobin: 12 g/dL (ref 12.0–15.0)
MCH: 31.6 pg (ref 26.0–34.0)
MCHC: 32.3 g/dL (ref 30.0–36.0)
MCV: 97.6 fL (ref 80.0–100.0)
Platelets: 343 10*3/uL (ref 150–400)
RBC: 3.8 MIL/uL — ABNORMAL LOW (ref 3.87–5.11)
RDW: 14.3 % (ref 11.5–15.5)
WBC: 7.6 10*3/uL (ref 4.0–10.5)
nRBC: 0 % (ref 0.0–0.2)

## 2021-10-27 MED ORDER — IPRATROPIUM-ALBUTEROL 0.5-2.5 (3) MG/3ML IN SOLN
3.0000 mL | Freq: Four times a day (QID) | RESPIRATORY_TRACT | Status: DC
  Administered 2021-10-27 (×2): 3 mL via RESPIRATORY_TRACT
  Filled 2021-10-27 (×2): qty 3

## 2021-10-27 MED ORDER — IPRATROPIUM-ALBUTEROL 0.5-2.5 (3) MG/3ML IN SOLN
3.0000 mL | Freq: Three times a day (TID) | RESPIRATORY_TRACT | Status: DC
  Administered 2021-10-28: 3 mL via RESPIRATORY_TRACT
  Filled 2021-10-27: qty 3

## 2021-10-27 MED ORDER — IPRATROPIUM-ALBUTEROL 0.5-2.5 (3) MG/3ML IN SOLN: 3.0000 mL | Freq: Four times a day (QID) | RESPIRATORY_TRACT | Status: DC | PRN

## 2021-10-27 NOTE — TOC Progression Note (Addendum)
Transition of Care [facility]) - Progression Note    Patient Details  Name: [name] MRN: [id number] Date of Birth: 25-Oct-1932  [facility] of [facility] CM/SW [place], RN Phone Number:[phone]  10/27/2021, 10:24 AM  Clinical Narrative:    CM received message that patient has home O2 needs. Message has been sent to MD for orders. Home O2 referral has been called to [facility].   1100 Per MD this patient is not medically ready for discharge due to acute pain. Will reassess O2 needs and enter orders accordingly.         Expected Discharge Plan and Services                                                 Social Determinants of Health (SDOH) Interventions    Readmission Risk Interventions     No data to display

## 2021-10-27 NOTE — Progress Notes (Signed)
[facility]  PROGRESS NOTE  [name] VEH:[id number] DOB: April 12, 1933 DOA: 10/24/2021 PCP: Alroy Dust, L.Marlou Sa, MD   Brief HPI:   88 year old female with medical history of anemia, GERD, hypertension, constipation, sinus tachycardia came to ED with complaints of nausea vomiting and abdominal pain.  Also 1 episode of vomiting complain of pain in the epigastric region.    Subjective   Patient seen and examined.  She is still does not feel well in fact she says that she is feeling worse than yesterday as far as her breathing goes and weakness goes.  She does not feel comfortable being discharged today.   Assessment/Plan:    Nausea/vomiting/abdominal pain -Significantly improved -CT abdomen/pelvis showed large hiatal hernia, also showed moderate constipation.  Lipase was normal. -Stool for C. difficile PCR is negative  Acute hypoxemic respiratory failure/parainfluenza virus bronchitis -Likely from viral pneumonia -Patient denies shortness of breath found to be hypoxemic on arrival -CT chest was negative for PE but showed right lower lobe consolidation, likely infectious -Respiratory panel was positive for parainfluenza virus; COVID RT-PCR was negative, influenza was negative -Antibiotics were not initiated as procalcitonin 0.18 -Patient started on oxygen -Still requiring oxygen, 2 L.  Patient does not feel comfortable going home.  We will keep her today.  Reassess oxygenation tomorrow.  She was slightly wheezy on exam, she is not on any bronchodilators, will start on DuoNeb scheduled every 6 hours.  ?  Atrial fibrillation -Patient's EKG showed atrial fibrillation -Cardiology was consulted -Cardiology deemed that patient does not have A-fib as this is likely from artifacts along with PACs and PVCs -Cardiology has discontinued anticoagulation with heparin -Echocardiogram showed EF 55 to 83%; grade 1 diastolic dysfunction -Cardiology has signed off  Acute kidney  injury -Resolved -Presented with creatinine of 1.34, improved to 1.04 with IV fluids  Chronic pain syndrome -Patient on MS Contin 100 mg p.o. twice daily  GERD -Continue Protonix, sucralfate  Disposition -Patient is from skilled nursing facility long-term resident -We will discharge once oxygen is weaned off, likely tomorrow.  Medications     chlorhexidine gluconate (MEDLINE KIT)  15 mL Mouth/Throat BID   Chlorhexidine Gluconate Cloth  6 each Topical Q0600   enoxaparin (LOVENOX) injection  40 mg Subcutaneous Q24H   famotidine  20 mg Oral QHS   ferrous sulfate  325 mg Oral Q breakfast   melatonin  3 mg Oral QHS   metoprolol tartrate  50 mg Oral BID   morphine  100 mg Oral Q12H   mupirocin ointment  1 Application Nasal BID   pantoprazole  40 mg Oral Daily   sertraline  25 mg Oral Daily   sodium chloride flush  3 mL Intravenous Q12H   sucralfate  1 g Oral TID AC     Data Reviewed:   CBG:  No results for input(s): "GLUCAP" in the last 168 hours.  SpO2: 92 % O2 Flow Rate (L/min): 2 L/min FiO2 (%): 100 %    Vitals:   10/26/21 2331 10/27/21 0315 10/27/21 0957 10/27/21 1058  BP: 112/64 (!) 118/58 111/64 (!) 109/59  Pulse: 81 72 78 82  Resp: _0 Temp: 98.1 F (36.7 C) 98 F (36.7 C) (!) 97.5 F (36.4 C) 97.9 F (36.6 C)  TempSrc: Oral Oral Oral Oral  SpO2: 91% 90% 92% 92%      Data Reviewed:  Basic Metabolic Panel: Recent Labs  Lab 10/24/21 1044 10/25/21 0213 10/26/21 0155 10/27/21 0411  NA 137 139 139  137  K 4.0 3.3* 4.1 4.4  CL 95* 100 103 105  CO2 _0 GLUCOSE 121* 100* 99 99  BUN 28* 25* 27* 22  CREATININE 1.34* 1.08* 1.04* 0.88  CALCIUM 9.4 8.4* 9.0 8.8*     CBC: Recent Labs  Lab 10/24/21 1044 10/26/21 0155 10/27/21 0411  WBC 14.7* 7.2 7.6  HGB 12.8 12.3 12.0  HCT 40.6 37.8 37.1  MCV 97.1 96.2 97.6  PLT 358 334 343     LFT Recent Labs  Lab 10/24/21 1044 10/27/21 0411  AST 21 22  ALT 9 11  ALKPHOS 48 41   BILITOT 0.7 0.4  PROT 7.1 5.7*  ALBUMIN 3.4* 2.6*      Antibiotics: Anti-infectives (From admission, onward)    Start     Dose/Rate Route Frequency Ordered Stop   10/24/21 2200  azithromycin (ZITHROMAX) 500 mg in sodium chloride 0.9 % 250 mL IVPB  Status:  Discontinued        500 mg 250 mL/hr over 60 Minutes Intravenous Every 24 hours 10/24/21 1926 10/25/21 0906   10/24/21 2200  cefTRIAXone (ROCEPHIN) 1 g in sodium chloride 0.9 % 100 mL IVPB  Status:  Discontinued        1 g 200 mL/hr over 30 Minutes Intravenous Every 24 hours 10/24/21 1926 10/25/21 0906   10/24/21 1600  cefTRIAXone (ROCEPHIN) 1 g in sodium chloride 0.9 % 100 mL IVPB  Status:  Discontinued        1 g 200 mL/hr over 30 Minutes Intravenous  Once 10/24/21 1551 10/24/21 1557   10/24/21 1600  azithromycin (ZITHROMAX) 500 mg in sodium chloride 0.9 % 250 mL IVPB  Status:  Discontinued        500 mg 250 mL/hr over 60 Minutes Intravenous  Once 10/24/21 1551 10/24/21 1557   10/24/21 1600  Ampicillin-Sulbactam (UNASYN) 3 g in sodium chloride 0.9 % 100 mL IVPB        3 g 200 mL/hr over 30 Minutes Intravenous  Once 10/24/21 1557 10/24/21 1715        DVT prophylaxis: Lovenox  Code Status: Full code  Family Communication: No family at bedside   CONSULTS cardiology   Objective    Physical Examination:  General exam: Appears calm and comfortable  Respiratory system: Clear to auscultation with intermittent end expiratory wheezes on the right lower lobe. Respiratory effort normal. Cardiovascular system: S1 & S2 heard, RRR. No JVD, murmurs, rubs, gallops or clicks. No pedal edema. Gastrointestinal system: Abdomen is nondistended, soft and nontender. No organomegaly or masses felt. Normal bowel sounds heard. Central nervous system: Alert and oriented. No focal neurological deficits. Extremities: Symmetric 5 x 5 power. Skin: No rashes, lesions or ulcers.  Psychiatry: Judgement and insight appear normal. Mood & affect  appropriate.    Status is: Inpatient: Viral pneumonia  [name]   [facility] If 7PM-7AM, please contact night-coverage at [email or web address], Office  [phone]   10/27/2021, 12:04 PM  LOS: 2 days

## 2021-10-27 NOTE — Progress Notes (Signed)
Patient at baseline is wheelchair bound, pt unable to do walking O2.  Tried to wean patient off of O2, at rest patient on room air patient O2 dropped to 85%.  Patient on 2L at 92-93%. Patient will require 2L of O2 for home.

## 2021-10-27 NOTE — Plan of Care (Signed)

## 2021-10-28 ENCOUNTER — Inpatient Hospital Stay (INDEPENDENT_AMBULATORY_CARE_PROVIDER_SITE_OTHER): Payer: Medicare (Managed Care)

## 2021-10-28 ENCOUNTER — Encounter: Payer: Self-pay | Admitting: *Deleted

## 2021-10-28 ENCOUNTER — Inpatient Hospital Stay (HOSPITAL_COMMUNITY): Payer: Medicare (Managed Care)

## 2021-10-28 ENCOUNTER — Other Ambulatory Visit: Payer: Self-pay | Admitting: Cardiology

## 2021-10-28 DIAGNOSIS — J9601 Acute respiratory failure with hypoxia: Secondary | ICD-10-CM | POA: Diagnosis not present

## 2021-10-28 DIAGNOSIS — I480 Paroxysmal atrial fibrillation: Secondary | ICD-10-CM

## 2021-10-28 LAB — BASIC METABOLIC PANEL
Anion gap: 4 — ABNORMAL LOW (ref 5–15)
BUN: 13 mg/dL (ref 8–23)
CO2: 27 mmol/L (ref 22–32)
Calcium: 9 mg/dL (ref 8.9–10.3)
Chloride: 106 mmol/L (ref 98–111)
Creatinine, Ser: 0.8 mg/dL (ref 0.44–1.00)
GFR, Estimated: >60 mL/min (ref >60)
Glucose, Bld: 147 mg/dL — ABNORMAL HIGH (ref 70–99)
Potassium: 4.5 mmol/L (ref 3.5–5.1)
Sodium: 137 mmol/L (ref 135–145)

## 2021-10-28 LAB — PROCALCITONIN: Procalcitonin: <0.1 ng/mL

## 2021-10-28 MED ORDER — IPRATROPIUM-ALBUTEROL 0.5-2.5 (3) MG/3ML IN SOLN
3.0000 mL | Freq: Two times a day (BID) | RESPIRATORY_TRACT | Status: DC
  Administered 2021-10-28 – 2021-10-30 (×4): 3 mL via RESPIRATORY_TRACT
  Filled 2021-10-28 (×4): qty 3

## 2021-10-28 MED ORDER — MORPHINE SULFATE ER 100 MG PO TBCR
100.0000 mg | EXTENDED_RELEASE_TABLET | Freq: Two times a day (BID) | ORAL | 0 refills | Status: AC
Start: 2021-10-28 — End: ?

## 2021-10-28 NOTE — Care Management Important Message (Signed)
Important Message  Patient Details  Name: [name] MRN: [id number] Date of Birth: 12-31-1932   Medicare Important Message Given:  Yes     Orbie Pyo 10/28/2021, 2:17 PM

## 2021-10-28 NOTE — TOC Initial Note (Signed)
Transition of Care [facility]) - Initial/Assessment Note    Patient Details  Name: [name] MRN: [id number] Date of Birth: 1932-08-08  Transition of Care [facility]) CM/SW Contact:    Tresa Endo Phone Number: 10/28/2021, 11:24 AM  Clinical Narrative:                 Pt is form [facility] and is able to return as soon as she is medically stable. CSW will continue to follow for DC planning needs.   Expected Discharge Plan: Skilled Nursing Facility Barriers to Discharge: Continued Medical Work up   Patient Goals and CMS Choice Patient states their goals for this hospitalization and ongoing recovery are:: Rehab CMS Medicare.gov Compare Post Acute Care list provided to:: Patient Choice offered to / list presented to : Patient  Expected Discharge Plan and Services Expected Discharge Plan: [place] arrangements for the past 2 months: [place] Expected Discharge Date: 10/28/21                                    Prior Living Arrangements/Services Living arrangements for the past 2 months: [place] Lives with:: Facility Resident Patient language and need for interpreter reviewed:: Yes              Criminal Activity/Legal Involvement Pertinent to Current Situation/Hospitalization: No - Comment as needed  Activities of Daily Living      Permission Sought/Granted Permission sought to share information with : Facility Sport and exercise psychologist Permission granted to share information with : Yes, Verbal Permission Granted  Share Information with NAME: Adel, Neyer (Son)   [phone]  Permission granted to share info w AGENCY: [place] granted to share info w Relationship: Jamieson, Lisa (Son)   [phone]  Permission granted to share info w Contact Information: [place], [place])   [phone]  Emotional Assessment Appearance:: Appears stated  age Attitude/Demeanor/Rapport: Unable to Assess Affect (typically observed): Unable to Assess Orientation: : Oriented to Self, Oriented to Place, Oriented to  Time, Oriented to Situation Alcohol / Substance Use: Not Applicable Psych Involvement: No (comment)  Admission diagnosis:  Atrial fibrillation with rapid ventricular response (HCC) [I48.91] Acute respiratory failure with hypoxia (HCC) [J96.01] AKI (acute kidney injury) ([place]) [N17.9] Atrial fibrillation with RVR (HCC) [I48.91] Constipation, unspecified constipation type [K59.00] Community acquired pneumonia, unspecified laterality [J18.9] Patient Active Problem List   Diagnosis Date Noted   Atrial fibrillation with rapid ventricular response ([place]) 10/25/2021   AKI (acute kidney injury) ([place]) 10/24/2021   Acute respiratory failure with hypoxia ([place]) 10/24/2021   Paroxysmal atrial fibrillation ([place]) 10/24/2021   Abdominal pain 10/24/2021   GERD (gastroesophageal reflux disease) 10/24/2021   Decubitus ulcer of upper back 06/27/2016   Chronic respiratory failure with hypoxia ([place]) 05/03/2016   Primary osteoarthritis involving multiple joints 05/03/2016   Chronic pain syndrome 05/03/2016   Hypoxia    Sepsis likely from pneumonia/parainfluenza  03/24/2016   Aspiration pneumonia ([place]) 03/24/2016   Protein-calorie malnutrition, severe ([place]) 03/24/2016   Sinus tachycardia 01/27/2014   Colitis 01/24/2014   Iron deficiency anemia 10/23/2012   Benign neoplasm of colon 10/23/2012   PCP:  Alroy Dust, L.Marlou Sa, MD Pharmacy:   [place], [place] - [address] [place] [address] Phone: [phone] Fax: [phone]     Social Determinants of Health ([place])  Interventions    Readmission Risk Interventions     No data to display

## 2021-10-28 NOTE — Progress Notes (Signed)
Progress Note  Patient Name: [name] Date of Encounter: 10/28/2021  [facility] Cardiologist: Berniece Salines, DO new  Subjective   Patient seen examined her bedside.  She awake when I arrived.  Did explain to the patient that her EKG yesterday was impressive for atrial flutter with variable block.  This made her very upset.  She also tells me today she does not want to go home.  She does not like the nursing home.  Inpatient Medications    Scheduled Meds:  chlorhexidine gluconate (MEDLINE KIT)  15 mL Mouth/Throat BID   Chlorhexidine Gluconate Cloth  6 each Topical Q0600   enoxaparin (LOVENOX) injection  40 mg Subcutaneous Q24H   famotidine  20 mg Oral QHS   ferrous sulfate  325 mg Oral Q breakfast   ipratropium-albuterol  3 mL Nebulization BID   melatonin  3 mg Oral QHS   metoprolol tartrate  50 mg Oral BID   morphine  100 mg Oral Q12H   mupirocin ointment  1 Application Nasal BID   pantoprazole  40 mg Oral Daily   sertraline  25 mg Oral Daily   sodium chloride flush  3 mL Intravenous Q12H   sucralfate  1 g Oral TID AC   Continuous Infusions:  sodium chloride     PRN Meds: sodium chloride, acetaminophen **OR** acetaminophen, ipratropium-albuterol, metoCLOPramide (REGLAN) injection, ondansetron **OR** ondansetron (ZOFRAN) IV, mouth rinse, polyethylene glycol, sodium chloride flush   Vital Signs    Vitals:   10/27/21 2332 10/28/21 0315 10/28/21 0754 10/28/21 0854  BP: (!) 116/59 126/69 123/70   Pulse: 89 89    Resp: 17 15 17    Temp: 98.3 F (36.8 C) 98.1 F (36.7 C) 98.3 F (36.8 C)   TempSrc: Oral  Oral   SpO2: 93% 93% 95% 93%    Intake/Output Summary (Last 24 hours) at 10/28/2021 1052 Last data filed at 10/28/2021 0755 Gross per 24 hour  Intake 720 ml  Output 800 ml  Net -80 ml      01/09/2021    9:14 PM 08/25/2016    1:24 PM 07/28/2016   11:57 AM  Last 3 Weights  Weight (lbs) 130 lb 116 lb 6.4 oz 120 lb 9.6 oz  Weight (kg) 58.968 kg 52.799 kg  54.704 kg      Telemetry    SR w/ FREQUENT PACs and PVCs - Personally Reviewed  ECG    Recheck pending - Personally Reviewed  Physical Exam   GEN: frail, elderly female,No acute distress.   Neck: mild JVD Cardiac: Irreg R&R, no murmurs, rubs, or gallops.  Respiratory: Rales bases bilaterally. GI: Soft, nontender, non-distended  MS: No edema; No deformity. Neuro:  Nonfocal  Psych: Normal affect   Labs    High Sensitivity Troponin:  No results for input(s): "TROPONINIHS" in the last 720 hours.   Chemistry Recent Labs  Lab 10/24/21 1044 10/25/21 0213 10/26/21 0155 10/27/21 0411 10/28/21 0920  NA 137   < > 139 137 137  K 4.0   < > 4.1 4.4 4.5  CL 95*   < > 103 105 106  CO2 25   < > 22 25 27   GLUCOSE 121*   < > 99 99 147*  BUN 28*   < > 27* 22 13  CREATININE 1.34*   < > 1.04* 0.88 0.80  CALCIUM 9.4   < > 9.0 8.8* 9.0  PROT 7.1  --   --  5.7*  --   ALBUMIN  3.4*  --   --  2.6*  --   AST 21  --   --  22  --   ALT 9  --   --  11  --   ALKPHOS 48  --   --  41  --   BILITOT 0.7  --   --  0.4  --   GFRNONAA 38*   < > 52* >60 >60  ANIONGAP 17*   < > 14 7 4*   < > = values in this interval not displayed.    Lipids No results for input(s): "CHOL", "TRIG", "HDL", "LABVLDL", "LDLCALC", "CHOLHDL" in the last 168 hours.  Hematology Recent Labs  Lab 10/24/21 1044 10/26/21 0155 10/27/21 0411  WBC 14.7* 7.2 7.6  RBC 4.18 3.93 3.80*  HGB 12.8 12.3 12.0  HCT 40.6 37.8 37.1  MCV 97.1 96.2 97.6  MCH 30.6 31.3 31.6  MCHC 31.5 32.5 32.3  RDW 14.6 14.3 14.3  PLT 358 334 343   Thyroid  Recent Labs  Lab 10/24/21 1800  TSH 1.702    BNP Recent Labs  Lab 10/24/21 1716  BNP 524.3*    DDimer  Recent Labs  Lab 10/24/21 1715  DDIMER 0.78*     Radiology    DG CHEST PORT 1 VIEW  Result Date: 10/28/2021 CLINICAL DATA:  Hypoxia EXAM: PORTABLE CHEST 1 VIEW COMPARISON:  10/24/2021 FINDINGS: Moderate to large hiatal hernia. Mildly worsened airspace opacity along the left  hemidiaphragm. Mildly elevated right hemidiaphragm. Probably persistent right retro diaphragmatic lower lobe airspace opacity. The patient is rotated to the right on today's radiograph, reducing diagnostic sensitivity and specificity. Atherosclerotic calcification of the aortic arch. IMPRESSION: 1. Mildly worsened airspace opacity along the left hemidiaphragm compatible with atelectasis or pneumonia. Suspected residual right lower lobe retro diaphragmatic airspace opacity. 2. Moderate to large hiatal hernia. 3.  Aortic Atherosclerosis (ICD10-I70.0). Electronically Signed   By: Van Clines M.D.   On: 10/28/2021 09:50    Cardiac Studies   ECHO: 10/25/2021  1. Left ventricular ejection fraction, by estimation, is 55 to 60%. The  left ventricle has normal function. Left ventricular endocardial border  not optimally defined to evaluate regional wall motion. There is mild left ventricular hypertrophy. Left ventricular diastolic parameters are consistent with Grade I diastolic dysfunction (impaired relaxation).   2. Right ventricular systolic function is normal. The right ventricular  size is normal. Tricuspid regurgitation signal is inadequate for assessing PA pressure.   3. The mitral valve is degenerative. No evidence of mitral valve  regurgitation. Moderate mitral annular calcification.   4. The aortic valve was not well visualized. Aortic valve regurgitation  is not visualized. No aortic stenosis is present.   5. The inferior vena cava is normal in size with greater than 50%  respiratory variability, suggesting right atrial pressure of 3 mmHg.   Patient Profile     88 y.o. female with a hx of anemia, GERD, HTN, constipation, sinus tachycardia in the setting of sepsis, was admitted 07/10 with hypoxic resp failure, +parainfluenza PNA, sepsis, ?afib >> Cards saw  Assessment & Plan    Atrial flutter with variable block-1 episode noted.  Otherwise in sinus rhythm with PACs.  Would like to do  is place a monitor on the patient as this could be associated with her acute illness.  If there are recurrence of her arrhythmia we will consider antiarrhythmic.  Plan to apply monitor at the time of discharge.  2. Acute resp illness and other  issues, per IM   For questions or updates, please contact [place] Please consult [email or web address] for contact info under      Signed, Berniece Salines, DO  10/28/2021, 10:52 AM     Questionable atrial fibrillation-EKG as well as telemetry does not show any evidence of atrial fibrillation.  She does have frequent PACs.  No need for any anticoagulation at this time.  [facility] will sign off.   Medication Recommendations:  none  Other recommendations (labs, testing, etc):  none  Follow up as an outpatient:  none

## 2021-10-28 NOTE — Progress Notes (Signed)
Patient at baseline is wheelchair bound unable to do walking O2, at rest patient desats to 85-86% on room air.  Patient is 90-92% on 2L of O2.

## 2021-10-28 NOTE — Plan of Care (Signed)

## 2021-10-28 NOTE — TOC Progression Note (Signed)
Transition of Care (TOC) - Progression Note  [employer] Appeal Detailed Notice of Discharge letter created and saved: Yes Detailed Notice of Discharge Document Given to [employer]: Yes [employer] ROI Document Created: Yes [employer] appeal documents uploaded to [employer] stite: Yes

## 2021-10-28 NOTE — TOC Progression Note (Signed)
Transition of Care [facility]) - Progression Note    Patient Details  Name: [name] MRN: [id number] Date of Birth: 10-18-1932  Transition of Care [facility]) CM/SW Contact  Orbie Pyo Phone Number: 10/28/2021, 2:49 PM  Clinical Narrative:     [name] Appeal Detailed Notice of Discharge letter created and saved: Yes Detailed Notice of Discharge Document Given to Pateint: Yes [employer] ROI Document Created: Yes [employer] appeal documents uploaded to [employer] stite: Yes

## 2021-10-28 NOTE — Progress Notes (Signed)
Ordered a 2 week monitor to evaluate burden of atrial fibrillation. To be read by Dr. Harriet Masson (patient's primary cardiologist)

## 2021-10-28 NOTE — Progress Notes (Unsigned)
Enrolled for [employer] to mail a ZIO XT long term holter monitor to the patients address on file.   Letter with instructions mailed to patient.  Dr. Harriet Masson to read.

## 2021-10-28 NOTE — Discharge Summary (Signed)
PatientPhysician Discharge Summary  [name] VWP:[id number] DOB: 10-14-1932 DOA: 10/24/2021  PCP: Alroy Dust, L.Marlou Sa, MD  Admit date: 10/24/2021 Discharge date: 10/28/2021 30 Day Unplanned Readmission Risk Score    Flowsheet Row ED to Hosp-Admission (Current) from 10/24/2021 in [facility]  30 Day Unplanned Readmission Risk Score (%) 14.46 Filed at 10/28/2021 0801       This score is the patient's risk of an unplanned readmission within 30 days of being discharged (0 -100%). The score is based on dignosis, age, lab data, medications, orders, and past utilization.   Low:  0-14.9   Medium: 15-21.9   High: 22-29.9   Extreme: 30 and above          Admitted From: Long-term care/SNF Disposition: Long-term care  Recommendations for Outpatient Follow-up:  Follow up with PCP in 1-2 weeks Please obtain BMP/CBC in one week Please follow up with your PCP on the following pending results: Unresulted Labs (From admission, onward)     Start     Ordered   11/01/21 0500  Creatinine, serum  (enoxaparin (LOVENOX)    CrCl >/= 30 ml/min)  Weekly,   R     Comments: while on enoxaparin therapy    10/25/21 1410              Home Health: None Equipment/Devices: None  Discharge Condition: Stable CODE STATUS: DNR Diet recommendation: Cardiac  Subjective: Seen and examined.  When asked how she was feeling, she says " I am not feeling well" when asked more questions about symptoms and she was given open-ended question to tell me if she had any symptoms, she verbalized " I do not have shortness of breath, I do not have any symptoms, I just do not feel well because I have to go back to the same place and I do not think they will take care of me" upon further clarification, she clarified that she was talking about the facility/long-term care where she has been residing for the past 6 years.  Otherwise she has no complaints today.  She understood that she is going to be discharged  today.  Brief/Interim Summary: 88 year old female with medical history of anemia, GERD, hypertension, constipation, sinus tachycardia came to ED with complaints of nausea vomiting and abdominal pain.  Also 1 episode of vomiting complain of pain in the epigastric region.  Detailed hospitalization as below.  Nausea/vomiting/abdominal pain -Significantly improved -CT abdomen/pelvis showed large hiatal hernia, also showed moderate constipation.  Lipase was normal. -Stool for C. difficile PCR is negative   Acute hypoxemic respiratory failure/parainfluenza virus bronchitis -Likely from viral pneumonia -Patient denies shortness of breath found to be hypoxemic on arrival -CT chest was negative for PE but showed right lower lobe consolidation, likely infectious -Respiratory panel was positive for parainfluenza virus; COVID RT-PCR was negative, influenza was negative -Antibiotics were not initiated as procalcitonin 0.18, patient had some rhonchi on the lung exam today, chest x-ray was repeated which shows slightly worsened infiltrates but procalcitonin once again is even better at less than 0.10 indicating that this is all still viral pneumonia so no antibiotics are indicated.  Patient is requiring 2 L of oxygen and she will be discharged on that.  We started her on DuoNeb yesterday and I think the she is clearing up her lungs.   ?  Atrial fibrillation -Patient's EKG showed atrial fibrillation -Cardiology was consulted -Cardiology deemed that patient does not have A-fib as this is likely from artifacts along with  PACs and PVCs -Cardiology had discontinued anticoagulation with heparin -Echocardiogram showed EF 55 to 20%; grade 1 diastolic dysfunction -Cardiology has signed off but then overnight, she was noted to have 1 brief episode of atrial flutter/fibrillation.  She was seen by cardiology again today however since the episode was only single in brief, they still do not recommend any anticoagulation  but they are going to place a Zio patch on the patient before she gets discharged.  She will follow-up with cardiology as outpatient.   Acute kidney injury -Resolved -Presented with creatinine of 1.34, improved to 1.04 with IV fluids   Chronic pain syndrome -Patient on MS Contin 100 mg p.o. twice daily   GERD -Continue Protonix, sucralfate   Disposition -Patient is from skilled nursing facility long-term resident.  She is upset about the discharge only because she has to go back to the facility which now she does not like and she does not has confidence in.  She was encouraged to talk to her family/children and consider moving to a different facility.  Discharge Diagnoses:  Principal Problem:   Acute respiratory failure with hypoxia (HCC) Active Problems:   Paroxysmal atrial fibrillation (HCC)   Sepsis likely from pneumonia/parainfluenza    AKI (acute kidney injury) ([place])   Abdominal pain   Sinus tachycardia   Chronic pain syndrome   GERD (gastroesophageal reflux disease)   Atrial fibrillation with rapid ventricular response ([place])    Discharge Instructions   Allergies as of 10/28/2021       Reactions   Ciprofloxacin Rash   burning        Medication List     TAKE these medications    acetaminophen 500 MG tablet Commonly known as: TYLENOL Take 1,000 mg by mouth every 6 (six) hours as needed for moderate pain.   aspirin 81 MG tablet Take 81 mg by mouth at bedtime.   chlorhexidine 0.12 % solution Commonly known as: PERIDEX Use as directed 15 mLs in the mouth or throat 2 (two) times daily.   famotidine 20 MG tablet Commonly known as: PEPCID Take 20 mg by mouth at bedtime.   ferrous sulfate 325 (65 FE) MG tablet Take 325 mg by mouth daily with breakfast.   furosemide 20 MG tablet Commonly known as: LASIX Take 20 mg by mouth daily.   LACTOBACILLUS PO Take 1 capsule by mouth daily.   loperamide 2 MG capsule Commonly known as: IMODIUM Take 2 mg by mouth  every 6 (six) hours as needed for diarrhea or loose stools.   melatonin 3 MG Tabs tablet Take 3 mg by mouth at bedtime.   metoprolol tartrate 50 MG tablet Commonly known as: LOPRESSOR Take 50 mg by mouth 2 (two) times daily.   morphine 100 MG 12 hr tablet Commonly known as: MS CONTIN Take 1 tablet (100 mg total) by mouth every 12 (twelve) hours.   ondansetron 8 MG tablet Commonly known as: ZOFRAN Take 8 mg by mouth every 8 (eight) hours as needed for nausea or vomiting.   OXYGEN Inhale 2 L/min into the lungs. Via nasal cannula   pantoprazole 40 MG tablet Commonly known as: PROTONIX Take 40 mg by mouth daily.   polyethylene glycol 17 g packet Commonly known as: MIRALAX / GLYCOLAX Take 17 g by mouth 2 (two) times daily. What changed:  when to take this reasons to take this   sertraline 25 MG tablet Commonly known as: ZOLOFT Take 25 mg by mouth daily.   sucralfate 1 g  tablet Commonly known as: CARAFATE Take 1 g by mouth 3 (three) times daily.   Vitamin D-3 125 MCG (5000 UT) Tabs Take 1 tablet by mouth daily.   Zinc Oxide 10 % Oint Apply 1 Application topically in the morning and at bedtime.               Durable Medical Equipment  (From admission, onward)           Start     Ordered   10/28/21 1118  For home use only DME oxygen  Once       Question Answer Comment  Length of Need Lifetime   Mode or (Route) Nasal cannula   Liters per Minute 2   Frequency Continuous (stationary and portable oxygen unit needed)   Oxygen delivery system Gas      10/28/21 1117            Follow-up Information     Alroy Dust, L.Marlou Sa, MD Follow up in 1 week(s).   Specialty: Family Medicine Contact information: [address] [place] [address] [phone]         Berniece Salines, DO .   Specialty: Cardiology Contact information: [address] [place] [address] [phone]                Allergies  Allergen  Reactions   Ciprofloxacin Rash    burning    Consultations: Cardiology   Procedures/Studies: DG CHEST PORT 1 VIEW  Result Date: 10/28/2021 CLINICAL DATA:  Hypoxia EXAM: PORTABLE CHEST 1 VIEW COMPARISON:  10/24/2021 FINDINGS: Moderate to large hiatal hernia. Mildly worsened airspace opacity along the left hemidiaphragm. Mildly elevated right hemidiaphragm. Probably persistent right retro diaphragmatic lower lobe airspace opacity. The patient is rotated to the right on today's radiograph, reducing diagnostic sensitivity and specificity. Atherosclerotic calcification of the aortic arch. IMPRESSION: 1. Mildly worsened airspace opacity along the left hemidiaphragm compatible with atelectasis or pneumonia. Suspected residual right lower lobe retro diaphragmatic airspace opacity. 2. Moderate to large hiatal hernia. 3.  Aortic Atherosclerosis (ICD10-I70.0). Electronically Signed   By: Van Clines M.D.   On: 10/28/2021 09:50   ECHOCARDIOGRAM COMPLETE  Result Date: 10/25/2021    ECHOCARDIOGRAM REPORT   Patient Name:   [name] Date of Exam: 10/25/2021 Medical Rec #:  [id number]      Height:       63.0 in Accession #:    [id number]     Weight:       130.0 lb Date of Birth:  04/10/1933      BSA:          1.610 m Patient Age:    88 years       BP:           118/60 mmHg Patient Gender: F              HR:           80 bpm. Exam Location:  Inpatient Procedure: 2D Echo, Color Doppler and Cardiac Doppler Indications:    hypoxia  History:        Patient has no prior history of Echocardiogram examinations.                 Arrythmias:Tachycardia and Atrial Fibrillation.  Sonographer:    Melissa Morford RDCS (AE, PE) Sonographer#2:  Ronny Flurry Referring Phys: [id number] Orma Flaming  Sonographer Comments: Suboptimal apical window. IMPRESSIONS  1. Left ventricular ejection fraction, by estimation, is 55  to 60%. The left ventricle has normal function. Left ventricular endocardial border not optimally defined  to evaluate regional wall motion. There is mild left ventricular hypertrophy. Left ventricular diastolic parameters are consistent with Grade I diastolic dysfunction (impaired relaxation).  2. Right ventricular systolic function is normal. The right ventricular size is normal. Tricuspid regurgitation signal is inadequate for assessing PA pressure.  3. The mitral valve is degenerative. No evidence of mitral valve regurgitation. Moderate mitral annular calcification.  4. The aortic valve was not well visualized. Aortic valve regurgitation is not visualized. No aortic stenosis is present.  5. The inferior vena cava is normal in size with greater than 50% respiratory variability, suggesting right atrial pressure of 3 mmHg. FINDINGS  Left Ventricle: Left ventricular ejection fraction, by estimation, is 55 to 60%. The left ventricle has normal function. Left ventricular endocardial border not optimally defined to evaluate regional wall motion. The left ventricular internal cavity size was small. There is mild left ventricular hypertrophy. Left ventricular diastolic parameters are consistent with Grade I diastolic dysfunction (impaired relaxation). Right Ventricle: The right ventricular size is normal. No increase in right ventricular wall thickness. Right ventricular systolic function is normal. Tricuspid regurgitation signal is inadequate for assessing PA pressure. Left Atrium: Left atrial size was normal in size. Right Atrium: Right atrial size was normal in size. Pericardium: Trivial pericardial effusion is present. Mitral Valve: The mitral valve is degenerative in appearance. Moderate mitral annular calcification. No evidence of mitral valve regurgitation. Tricuspid Valve: The tricuspid valve is normal in structure. Tricuspid valve regurgitation is trivial. Aortic Valve: The aortic valve was not well visualized. Aortic valve regurgitation is not visualized. No aortic stenosis is present. Pulmonic Valve: The pulmonic  valve was not well visualized. Pulmonic valve regurgitation is not visualized. Aorta: The aortic root and ascending aorta are structurally normal, with no evidence of dilitation. Venous: The inferior vena cava is normal in size with greater than 50% respiratory variability, suggesting right atrial pressure of 3 mmHg. IAS/Shunts: The interatrial septum was not well visualized.  LEFT VENTRICLE PLAX 2D LVOT diam:     2.00 cm   Diastology LV SV:         27        LV e' lateral:   5.87 cm/s LV SV Index:   17        LV E/e' lateral: 9.0 LVOT Area:     3.14 cm  LEFT ATRIUM           Index        RIGHT ATRIUM          Index LA Vol (A2C): 23.7 ml 14.72 ml/m  RA Area:     5.79 cm LA Vol (A4C): 17.9 ml 11.12 ml/m  RA Volume:   8.16 ml  5.07 ml/m  AORTIC VALVE LVOT Vmax:   50.30 cm/s LVOT Vmean:  32.600 cm/s LVOT VTI:    0.087 m  AORTA Ao Root diam: 3.10 cm Ao Asc diam:  3.30 cm MITRAL VALVE MV Area (PHT): 6.22 cm     SHUNTS MV Decel Time: 122 msec     Systemic VTI:  0.09 m MV E velocity: 52.70 cm/s   Systemic Diam: 2.00 cm MV A velocity: 105.00 cm/s MV E/A ratio:  0.50 Oswaldo Milian MD Electronically signed by Oswaldo Milian MD Signature Date/Time: 10/25/2021/2:10:56 PM    Final    CT Angio Chest PE W and/or Wo Contrast  Result Date: 10/24/2021 CLINICAL DATA:  Pulmonary embolism  suspected, high probability. EXAM: CT ANGIOGRAPHY CHEST WITH CONTRAST TECHNIQUE: Multidetector CT imaging of the chest was performed using the standard protocol during bolus administration of intravenous contrast. Multiplanar CT image reconstructions and MIPs were obtained to evaluate the vascular anatomy. RADIATION DOSE REDUCTION: This exam was performed according to the departmental dose-optimization program which includes automated exposure control, adjustment of the mA and/or kV according to patient size and/or use of iterative reconstruction technique. CONTRAST:  34m OMNIPAQUE IOHEXOL 350 MG/ML SOLN COMPARISON:  09/06/2010.  FINDINGS: Cardiovascular: The heart is mildly enlarged and there is no pericardial effusion. Scattered coronary artery calcifications are noted. There is atherosclerotic calcification of the aorta without evidence of aneurysm. The pulmonary trunk is normal in caliber. No definite pulmonary artery filling defect. Mediastinum/Nodes: Multiple enlarged lymph nodes are present in the mediastinum measuring up to 1.8 cm in the paratracheal space on the left. Hilar lymphadenopathy is present bilaterally. No axillary lymphadenopathy. The thyroid gland and trachea are within normal limits. The visualized esophagus is within normal limits. There is a large hiatal hernia with the stomach intrathoracic in location. Lungs/Pleura: There is consolidation in the right lower lobe and patchy airspace disease at the left lung base. Tree-in-bud nodular densities are noted in the right upper and middle lobes. No effusion or pneumothorax. Upper Abdomen: No acute abnormality. There is reflux of contrast into the inferior vena cava and hepatic veins suggesting right heart failure. Musculoskeletal: Degenerative changes are present in the thoracic spine. Laminectomy changes and spinal fusion hardware is noted in the upper lumbar spine. No acute fracture is seen. Bilateral breast implants are noted. Review of the MIP images confirms the above findings. IMPRESSION: 1. No evidence of pulmonary embolism. 2. Consolidation in the right lower lobe with patchy airspace disease in the left lower lobe, possible atelectasis or infiltrate. Follow-up is recommended until resolution to exclude the possibility of underlying neoplastic process. 3. Scattered tree-in-bud nodular opacities in the right upper and middle lobes which may be infectious or inflammatory. 4. Mediastinal lymphadenopathy, which may be reactive. 5. Cardiomegaly with coronary artery calcifications. 6. Aortic atherosclerosis. 7. Large hiatal hernia. Electronically Signed   By: LBrett FairyM.D.   On: 10/24/2021 21:35   CT ABDOMEN PELVIS W CONTRAST  Result Date: 10/24/2021 CLINICAL DATA:  Nausea/vomiting Abdominal pain, acute, nonlocalized EXAM: CT ABDOMEN AND PELVIS WITH CONTRAST TECHNIQUE: Multidetector CT imaging of the abdomen and pelvis was performed using the standard protocol following bolus administration of intravenous contrast. RADIATION DOSE REDUCTION: This exam was performed according to the departmental dose-optimization program which includes automated exposure control, adjustment of the mA and/or kV according to patient size and/or use of iterative reconstruction technique. CONTRAST:  661mOMNIPAQUE IOHEXOL 300 MG/ML  SOLN COMPARISON:  CT abdomen pelvis 03/28/2016 FINDINGS: Lower chest: Elevated right hemidiaphragm. Right basilar consolidation. Additional tree-in-bud opacities in the right middle lobe, lingula, and left lower lobe. Mild bronchiectasis. Right middle lobe atelectasis. Bilateral breast implants with calcified capsules. Hepatobiliary: Unchanged hepatic cysts. The gallbladder is unremarkable. Pancreas: Unremarkable. No pancreatic ductal dilatation or surrounding inflammatory changes. Spleen: Unchanged lobulation of the spleen with areas of scarring and evidence of of prior infarcts. No new splenic abnormality. Adrenals/Urinary Tract: Adrenal glands are unremarkable. No hydronephrosis or nephrolithiasis. Unchanged bilateral renal cyst. No follow-up imaging is recommended. There is marked distension of the urinary bladder. Stomach/Bowel: Large hiatal hernia with intrathoracic stomach. There is no evidence of bowel obstruction.Prior appendectomy. Mild-to-moderate stool burden. Scattered colonic diverticula. Vascular/Lymphatic: Aortoiliac atherosclerosis. There is a  posterior mediastinal enlarged lymph node measuring up to 1.2 cm short axis (series 3, image 1), likely reactive. Reproductive: Prior hysterectomy. Other: Loss of body fat in comparison prior exam in 2017.  No hernia. No significant ascites. No free air. Musculoskeletal: Osteopenia. No acute osseous abnormality. Unchanged lumbosacral fusion hardware without evidence of complication. Multilevel degenerative changes of the spine. Unchanged spinal alignment. IMPRESSION: Right lower lobe pneumonia with additional tree-in-bud opacities in the middle lobe, lingula, and left lower lobe, potentially aspiration-related. Large hiatal hernia with intrathoracic stomach. Mild-to-moderate stool burden suggesting constipation. Marked distension of the urinary bladder. Electronically Signed   By: Maurine Simmering M.D.   On: 10/24/2021 14:24   DG Chest 2 View  Result Date: 10/24/2021 CLINICAL DATA:  Chest pain, nausea and abdominal pain. EXAM: CHEST - 2 VIEW COMPARISON:  01/09/2021 FINDINGS: Large hiatal hernia is again noted. Stable cardiomediastinal contours. No pleural effusion or edema. Chronic asymmetric elevation of the right hemidiaphragm. Mild atelectasis noted within the right base. No airspace consolidation identified. Spondylosis noted within the lower thoracic spine. Capsular calcification of bilateral breast implants noted IMPRESSION: Chronic asymmetric elevation of the right hemidiaphragm with right base atelectasis. Large hiatal hernia. Aortic Atherosclerosis (ICD10-I70.0). Electronically Signed   By: Kerby Moors M.D.   On: 10/24/2021 11:25     Discharge Exam: Vitals:   10/28/21 0854 10/28/21 1116  BP:  125/65  Pulse:  86  Resp:  19  Temp:  98.5 F (36.9 C)  SpO2: 93% 94%   Vitals:   10/28/21 0315 10/28/21 0754 10/28/21 0854 10/28/21 1116  BP: 126/69 123/70  125/65  Pulse: 89   86  Resp: '15 17  19  '$ Temp: 98.1 F (36.7 C) 98.3 F (36.8 C)  98.5 F (36.9 C)  TempSrc:  Oral  Oral  SpO2: 93% 95% 93% 94%    General: Pt is alert, awake, not in acute distress Cardiovascular: RRR, S1/S2 +, no rubs, no gallops Respiratory: Scattered rhonchi bilaterally, no wheezes today. Abdominal: Soft, NT, ND,  bowel sounds + Extremities: no edema, no cyanosis    The results of significant diagnostics from this hospitalization (including imaging, microbiology, ancillary and laboratory) are listed below for reference.     Microbiology: Recent Results (from the past 240 hour(s))  Resp Panel by RT-PCR (Flu A&B, Covid) Anterior Nasal Swab     Status: None   Collection Time: 10/24/21 12:54 PM   Specimen: Anterior Nasal Swab  Result Value Ref Range Status   SARS Coronavirus 2 by RT PCR NEGATIVE NEGATIVE Final    Comment: (NOTE) SARS-CoV-2 target nucleic acids are NOT DETECTED.  The SARS-CoV-2 RNA is generally detectable in upper respiratory specimens during the acute phase of infection. The lowest concentration of SARS-CoV-2 viral copies this assay can detect is 138 copies/mL. A negative result does not preclude SARS-Cov-2 infection and should not be used as the sole basis for treatment or other patient management decisions. A negative result may occur with  improper specimen collection/handling, submission of specimen other than nasopharyngeal swab, presence of viral mutation(s) within the areas targeted by this assay, and inadequate number of viral copies(<138 copies/mL). A negative result must be combined with clinical observations, patient history, and epidemiological information. The expected result is Negative.  Fact Sheet for Patients:  EntrepreneurPulse.com.au  Fact Sheet for Healthcare Providers:  IncredibleEmployment.be  This test is no t yet approved or cleared by the [place] FDA and  has been authorized for detection and/or diagnosis of SARS-CoV-2 by FDA  under an Emergency Use Authorization (EUA). This EUA will remain  in effect (meaning this test can be used) for the duration of the COVID-19 declaration under Section 564(b)(1) of the Act, 21 U.S.C.section 360bbb-3(b)(1), unless the authorization is terminated  or revoked sooner.        Influenza A by PCR NEGATIVE NEGATIVE Final   Influenza B by PCR NEGATIVE NEGATIVE Final    Comment: (NOTE) The Xpert Xpress SARS-CoV-2/FLU/RSV plus assay is intended as an aid in the diagnosis of influenza from Nasopharyngeal swab specimens and should not be used as a sole basis for treatment. Nasal washings and aspirates are unacceptable for Xpert Xpress SARS-CoV-2/FLU/RSV testing.  Fact Sheet for Patients: EntrepreneurPulse.com.au  Fact Sheet for Healthcare Providers: IncredibleEmployment.be  This test is not yet approved or cleared by the [place] FDA and has been authorized for detection and/or diagnosis of SARS-CoV-2 by FDA under an Emergency Use Authorization (EUA). This EUA will remain in effect (meaning this test can be used) for the duration of the COVID-19 declaration under Section 564(b)(1) of the Act, 21 U.S.C. section 360bbb-3(b)(1), unless the authorization is terminated or revoked.  Performed at [facility], [place] [address], [place], [place] [address]   Respiratory (~20 pathogens) panel by PCR     Status: Abnormal   Collection Time: 10/24/21  5:16 PM   Specimen: Nasopharyngeal Swab; Respiratory  Result Value Ref Range Status   Adenovirus NOT DETECTED NOT DETECTED Final   Coronavirus 229E NOT DETECTED NOT DETECTED Final    Comment: (NOTE) The Coronavirus on the Respiratory Panel, DOES NOT test for the novel  Coronavirus (2019 nCoV)    Coronavirus HKU1 NOT DETECTED NOT DETECTED Final   Coronavirus NL63 NOT DETECTED NOT DETECTED Final   Coronavirus OC43 NOT DETECTED NOT DETECTED Final   Metapneumovirus NOT DETECTED NOT DETECTED Final   Rhinovirus / Enterovirus NOT DETECTED NOT DETECTED Final   Influenza A NOT DETECTED NOT DETECTED Final   Influenza B NOT DETECTED NOT DETECTED Final   Parainfluenza Virus 1 NOT DETECTED NOT DETECTED Final   Parainfluenza Virus 2 NOT DETECTED NOT DETECTED Final    Parainfluenza Virus 3 DETECTED (A) NOT DETECTED Final   Parainfluenza Virus 4 NOT DETECTED NOT DETECTED Final   Respiratory Syncytial Virus NOT DETECTED NOT DETECTED Final   Bordetella pertussis NOT DETECTED NOT DETECTED Final   Bordetella Parapertussis NOT DETECTED NOT DETECTED Final   Chlamydophila pneumoniae NOT DETECTED NOT DETECTED Final   Mycoplasma pneumoniae NOT DETECTED NOT DETECTED Final    Comment: Performed at [facility], [place]. [address], [place], [place] [address]  Gastrointestinal Panel by PCR , Stool     Status: None   Collection Time: 10/25/21 10:09 AM   Specimen: Stool  Result Value Ref Range Status   Campylobacter species NOT DETECTED NOT DETECTED Final   Plesimonas shigelloides NOT DETECTED NOT DETECTED Final   Salmonella species NOT DETECTED NOT DETECTED Final   Yersinia enterocolitica NOT DETECTED NOT DETECTED Final   Vibrio species NOT DETECTED NOT DETECTED Final   Vibrio cholerae NOT DETECTED NOT DETECTED Final   Enteroaggregative E coli (EAEC) NOT DETECTED NOT DETECTED Final   Enteropathogenic E coli (EPEC) NOT DETECTED NOT DETECTED Final   Enterotoxigenic E coli (ETEC) NOT DETECTED NOT DETECTED Final   Shiga like toxin producing E coli (STEC) NOT DETECTED NOT DETECTED Final   Shigella/Enteroinvasive E coli (EIEC) NOT DETECTED NOT DETECTED Final   Cryptosporidium NOT DETECTED NOT DETECTED Final  Cyclospora cayetanensis NOT DETECTED NOT DETECTED Final   Entamoeba histolytica NOT DETECTED NOT DETECTED Final   Giardia lamblia NOT DETECTED NOT DETECTED Final   Adenovirus F40/41 NOT DETECTED NOT DETECTED Final   Astrovirus NOT DETECTED NOT DETECTED Final   Norovirus GI/GII NOT DETECTED NOT DETECTED Final   Rotavirus A NOT DETECTED NOT DETECTED Final   Sapovirus (I, II, IV, and V) NOT DETECTED NOT DETECTED Final    Comment: Performed at [facility], [address], [place], [place] [address]  C Difficile Quick Screen w PCR reflex     Status: None    Collection Time: 10/25/21 10:09 AM   Specimen: Stool  Result Value Ref Range Status   C Diff antigen NEGATIVE NEGATIVE Final   C Diff toxin NEGATIVE NEGATIVE Final   C Diff interpretation No C. difficile detected.  Final    Comment: Performed at [facility], [place] [address], [place], [place] [address]  MRSA Next Gen by PCR, Nasal     Status: Abnormal   Collection Time: 10/25/21  6:04 PM   Specimen: Nasal Mucosa; Nasal Swab  Result Value Ref Range Status   MRSA by PCR Next Gen DETECTED (A) NOT DETECTED Final    Comment: RESULT CALLED TO, READ BACK BY AND VERIFIED WITH:  RN Jerilynn Mages [phone] @ 2152 FH (NOTE) The GeneXpert MRSA Assay (FDA approved for NASAL specimens only), is one component of a comprehensive MRSA colonization surveillance program. It is not intended to diagnose MRSA infection nor to guide or monitor treatment for MRSA infections. Test performance is not FDA approved in patients less than 18 years old. Performed at [facility], [place] [address] [place], [place] [address]      Labs: BNP (last 3 results) Recent Labs    01/09/21 2300 10/24/21 1716  BNP 230.2* 423.5*   Basic Metabolic Panel: Recent Labs  Lab 10/24/21 1044 10/25/21 0213 10/26/21 0155 10/27/21 0411 10/28/21 0920  NA 137 139 139 137 137  K 4.0 3.3* 4.1 4.4 4.5  CL 95* 100 103 105 106  CO2 '25 23 22 25 27  '$ GLUCOSE 121* 100* 99 99 147*  BUN 28* 25* 27* 22 13  CREATININE 1.34* 1.08* 1.04* 0.88 0.80  CALCIUM 9.4 8.4* 9.0 8.8* 9.0   Liver Function Tests: Recent Labs  Lab 10/24/21 1044 10/27/21 0411  AST 21 22  ALT 9 11  ALKPHOS 48 41  BILITOT 0.7 0.4  PROT 7.1 5.7*  ALBUMIN 3.4* 2.6*   Recent Labs  Lab 10/24/21 1044  LIPASE 19   No results for input(s): "AMMONIA" in the last 168 hours. CBC: Recent Labs  Lab 10/24/21 1044 10/26/21 0155 10/27/21 0411  WBC 14.7* 7.2 7.6  HGB 12.8 12.3 12.0  HCT 40.6 37.8 37.1  MCV 97.1 96.2 97.6  PLT 358 334 343   Cardiac  Enzymes: No results for input(s): "CKTOTAL", "CKMB", "CKMBINDEX", "TROPONINI" in the last 168 hours. BNP: Invalid input(s): "POCBNP" CBG: No results for input(s): "GLUCAP" in the last 168 hours. D-Dimer No results for input(s): "DDIMER" in the last 72 hours. Hgb A1c No results for input(s): "HGBA1C" in the last 72 hours. Lipid Profile No results for input(s): "CHOL", "HDL", "LDLCALC", "TRIG", "CHOLHDL", "LDLDIRECT" in the last 72 hours. Thyroid function studies No results for input(s): "TSH", "T4TOTAL", "T3FREE", "THYROIDAB" in the last 72 hours.  Invalid input(s): "FREET3" Anemia work up No results for input(s): "VITAMINB12", "FOLATE", "FERRITIN", "TIBC", "IRON", "RETICCTPCT" in the last 72  hours. Urinalysis    Component Value Date/Time   COLORURINE YELLOW 10/24/2021 1044   APPEARANCEUR CLEAR 10/24/2021 1044   LABSPEC 1.014 10/24/2021 1044   PHURINE 5.0 10/24/2021 1044   GLUCOSEU NEGATIVE 10/24/2021 1044   HGBUR NEGATIVE 10/24/2021 1044   BILIRUBINUR NEGATIVE 10/24/2021 1044   KETONESUR 5 (A) 10/24/2021 1044   PROTEINUR NEGATIVE 10/24/2021 1044   UROBILINOGEN 0.2 01/26/2014 2329   NITRITE NEGATIVE 10/24/2021 1044   LEUKOCYTESUR TRACE (A) 10/24/2021 1044   Sepsis Labs Recent Labs  Lab 10/24/21 1044 10/26/21 0155 10/27/21 0411  WBC 14.7* 7.2 7.6   Microbiology Recent Results (from the past 240 hour(s))  Resp Panel by RT-PCR (Flu A&B, Covid) Anterior Nasal Swab     Status: None   Collection Time: 10/24/21 12:54 PM   Specimen: Anterior Nasal Swab  Result Value Ref Range Status   SARS Coronavirus 2 by RT PCR NEGATIVE NEGATIVE Final    Comment: (NOTE) SARS-CoV-2 target nucleic acids are NOT DETECTED.  The SARS-CoV-2 RNA is generally detectable in upper respiratory specimens during the acute phase of infection. The lowest concentration of SARS-CoV-2 viral copies this assay can detect is 138 copies/mL. A negative result does not preclude SARS-Cov-2 infection and  should not be used as the sole basis for treatment or other patient management decisions. A negative result may occur with  improper specimen collection/handling, submission of specimen other than nasopharyngeal swab, presence of viral mutation(s) within the areas targeted by this assay, and inadequate number of viral copies(<138 copies/mL). A negative result must be combined with clinical observations, patient history, and epidemiological information. The expected result is Negative.  Fact Sheet for Patients:  EntrepreneurPulse.com.au  Fact Sheet for Healthcare Providers:  IncredibleEmployment.be  This test is no t yet approved or cleared by the [place] FDA and  has been authorized for detection and/or diagnosis of SARS-CoV-2 by FDA under an Emergency Use Authorization (EUA). This EUA will remain  in effect (meaning this test can be used) for the duration of the COVID-19 declaration under Section 564(b)(1) of the Act, 21 U.S.C.section 360bbb-3(b)(1), unless the authorization is terminated  or revoked sooner.       Influenza A by PCR NEGATIVE NEGATIVE Final   Influenza B by PCR NEGATIVE NEGATIVE Final    Comment: (NOTE) The Xpert Xpress SARS-CoV-2/FLU/RSV plus assay is intended as an aid in the diagnosis of influenza from Nasopharyngeal swab specimens and should not be used as a sole basis for treatment. Nasal washings and aspirates are unacceptable for Xpert Xpress SARS-CoV-2/FLU/RSV testing.  Fact Sheet for Patients: EntrepreneurPulse.com.au  Fact Sheet for Healthcare Providers: IncredibleEmployment.be  This test is not yet approved or cleared by the [place] FDA and has been authorized for detection and/or diagnosis of SARS-CoV-2 by FDA under an Emergency Use Authorization (EUA). This EUA will remain in effect (meaning this test can be used) for the duration of the COVID-19 declaration  under Section 564(b)(1) of the Act, 21 U.S.C. section 360bbb-3(b)(1), unless the authorization is terminated or revoked.  Performed at [facility], [place] [address], [place], [place] [address]   Respiratory (~20 pathogens) panel by PCR     Status: Abnormal   Collection Time: 10/24/21  5:16 PM   Specimen: Nasopharyngeal Swab; Respiratory  Result Value Ref Range Status   Adenovirus NOT DETECTED NOT DETECTED Final   Coronavirus 229E NOT DETECTED NOT DETECTED Final    Comment: (NOTE) The Coronavirus on the Respiratory Panel, DOES NOT test for the novel  Coronavirus (2019 nCoV)    Coronavirus HKU1 NOT DETECTED NOT DETECTED Final   Coronavirus NL63 NOT DETECTED NOT DETECTED Final   Coronavirus OC43 NOT DETECTED NOT DETECTED Final   Metapneumovirus NOT DETECTED NOT DETECTED Final   Rhinovirus / Enterovirus NOT DETECTED NOT DETECTED Final   Influenza A NOT DETECTED NOT DETECTED Final   Influenza B NOT DETECTED NOT DETECTED Final   Parainfluenza Virus 1 NOT DETECTED NOT DETECTED Final   Parainfluenza Virus 2 NOT DETECTED NOT DETECTED Final   Parainfluenza Virus 3 DETECTED (A) NOT DETECTED Final   Parainfluenza Virus 4 NOT DETECTED NOT DETECTED Final   Respiratory Syncytial Virus NOT DETECTED NOT DETECTED Final   Bordetella pertussis NOT DETECTED NOT DETECTED Final   Bordetella Parapertussis NOT DETECTED NOT DETECTED Final   Chlamydophila pneumoniae NOT DETECTED NOT DETECTED Final   Mycoplasma pneumoniae NOT DETECTED NOT DETECTED Final    Comment: Performed at [facility], [place] [address] [place], [place] [address]  Gastrointestinal Panel by PCR , Stool     Status: None   Collection Time: 10/25/21 10:09 AM   Specimen: Stool  Result Value Ref Range Status   Campylobacter species NOT DETECTED NOT DETECTED Final   Plesimonas shigelloides NOT DETECTED NOT DETECTED Final   Salmonella species NOT DETECTED NOT DETECTED Final   Yersinia enterocolitica NOT DETECTED NOT DETECTED  Final   Vibrio species NOT DETECTED NOT DETECTED Final   Vibrio cholerae NOT DETECTED NOT DETECTED Final   Enteroaggregative E coli (EAEC) NOT DETECTED NOT DETECTED Final   Enteropathogenic E coli (EPEC) NOT DETECTED NOT DETECTED Final   Enterotoxigenic E coli (ETEC) NOT DETECTED NOT DETECTED Final   Shiga like toxin producing E coli (STEC) NOT DETECTED NOT DETECTED Final   Shigella/Enteroinvasive E coli (EIEC) NOT DETECTED NOT DETECTED Final   Cryptosporidium NOT DETECTED NOT DETECTED Final   Cyclospora cayetanensis NOT DETECTED NOT DETECTED Final   Entamoeba histolytica NOT DETECTED NOT DETECTED Final   Giardia lamblia NOT DETECTED NOT DETECTED Final   Adenovirus F40/41 NOT DETECTED NOT DETECTED Final   Astrovirus NOT DETECTED NOT DETECTED Final   Norovirus GI/GII NOT DETECTED NOT DETECTED Final   Rotavirus A NOT DETECTED NOT DETECTED Final   Sapovirus (I, II, IV, and V) NOT DETECTED NOT DETECTED Final    Comment: Performed at [facility], [place] [place], [place] [address]  C Difficile Quick Screen w PCR reflex     Status: None   Collection Time: 10/25/21 10:09 AM   Specimen: Stool  Result Value Ref Range Status   C Diff antigen NEGATIVE NEGATIVE Final   C Diff toxin NEGATIVE NEGATIVE Final   C Diff interpretation No C. difficile detected.  Final    Comment: Performed at [facility], [place] [address] [place], [place] [address]  MRSA Next Gen by PCR, Nasal     Status: Abnormal   Collection Time: 10/25/21  6:04 PM   Specimen: Nasal Mucosa; Nasal Swab  Result Value Ref Range Status   MRSA by PCR Next Gen DETECTED (A) NOT DETECTED Final    Comment: RESULT CALLED TO, READ BACK BY AND VERIFIED WITH:  RN Jerilynn Mages [phone] @ 2152 FH (NOTE) The GeneXpert MRSA Assay (FDA approved for NASAL specimens only), is one component of a comprehensive MRSA colonization surveillance program. It is not intended to diagnose MRSA infection nor to guide or monitor treatment for  MRSA infections. Test performance is not FDA approved in  patients less than 8 years old. Performed at [facility], [place] [address], [place], [place] [address]      Time coordinating discharge: Over 30 minutes  SIGNED:   [name], MD  [facility] 10/28/2021, 11:17 AM *Please note that this is a verbal dictation therefore any spelling or grammatical errors are due to the "[place]" system interpretation. If 7PM-7AM, please contact night-coverage [email or web address]

## 2021-10-29 DIAGNOSIS — J9601 Acute respiratory failure with hypoxia: Secondary | ICD-10-CM | POA: Diagnosis not present

## 2021-10-29 MED ORDER — METOPROLOL TARTRATE 50 MG PO TABS
100.0000 mg | ORAL_TABLET | Freq: Two times a day (BID) | ORAL | Status: DC
  Administered 2021-10-29 – 2021-10-30 (×3): 100 mg via ORAL
  Filled 2021-10-29 (×3): qty 2

## 2021-10-29 NOTE — TOC Progression Note (Addendum)
Transition of Care [facility]) - Progression Note    Patient Details  Name: [name] MRN: [id number] Date of Birth: 08/20/1932  Transition of Care [facility]) CM/SW [place], RN Phone [phone]  10/29/2021, 12:00 PM  Clinical Narrative:    Cm received message from MD questioning if patient has cancelled the discharge appeal. Nurse states that patient is fine with discharging back to the facility now. Cm has made team aware that patient will need to call kepro to cancel the appeal. Once CM receives official notice that appeal has been cancelled [facility] will move forward with the discharge process.    1430 CM at bedside to clarify if patient wishes to cancel discharge appeal. Per patient she does not wish to cancel the discharge appeal. Patient states that she is agreeable to go back to her facility but after the appeal process. Patient verbalized understanding that she is waiting to hear the determination of the appeal. Care team has been updated.   Expected Discharge Plan: [place] Barriers to Discharge: Continued Medical Work up  Expected Discharge Plan and Services Expected Discharge Plan: [place] arrangements for the past 2 months: [place] Expected Discharge Date: 10/28/21                                     Social Determinants of Health (SDOH) Interventions    Readmission Risk Interventions     No data to display

## 2021-10-29 NOTE — Progress Notes (Signed)
[facility]  PROGRESS NOTE  SEHAJ KOLDEN RWE:[id number] DOB: [birth year] DOA: 10/24/2021 PCP: Alroy Dust, L.Marlou Sa, MD   Brief HPI:   88 year old female with medical history of anemia, GERD, hypertension, constipation, sinus tachycardia came to ED with complaints of nausea vomiting and abdominal pain.  Also 1 episode of vomiting complain of pain in the epigastric region.    Subjective   Seen and examined.  Remains anxious and nervous as usual.  Does not have any respiratory complaints.  She says " I am just worried about my heart" but denies any shortness of breath.   Assessment/Plan:    Nausea/vomiting/abdominal pain -Significantly improved -CT abdomen/pelvis showed large hiatal hernia, also showed moderate constipation.  Lipase was normal. -Stool for C. difficile PCR is negative  Acute hypoxemic respiratory failure/parainfluenza virus bronchitis -Likely from viral pneumonia -Patient denies shortness of breath found to be hypoxemic on arrival -CT chest was negative for PE but showed right lower lobe consolidation, likely infectious -Respiratory panel was positive for parainfluenza virus; COVID RT-PCR was negative, influenza was negative -Antibiotics were not initiated as procalcitonin 0.18 -Patient started on oxygen -Still requiring oxygen, 2 L.  Will need home oxygen which is ordered.  Due to increased rhonchi yesterday, we repeated chest x-ray which shows increased infiltrates but procalcitonin was even better at this time, indicating likely viral pneumonia and she is likely clearing up since I started her on DuoNeb, will continue to monitor off of antibiotics.  ?  Atrial fibrillation -Patient's EKG showed atrial fibrillation -Cardiology was consulted -Cardiology deemed that patient does not have A-fib as this is likely from artifacts along with PACs and PVCs -Cardiology has discontinued anticoagulation with heparin -Echocardiogram showed EF 55 to 61%; grade 1 diastolic  dysfunction Cardiology had signed off but then overnight, the night before, she was noted to have 1 brief episode of atrial flutter/fibrillation.  She was seen by cardiology again yesterday however since the episode was only single in brief, they still do not recommend any anticoagulation but they are going to place a Zio patch on the patient before she gets discharged or as outpatient.  She will follow-up with cardiology as outpatient.  Heart rates were elevated this morning, I have increased her Lopressor from 50 mg p.o. twice daily to 100 mg p.o. twice daily.  Acute kidney injury -Resolved -Presented with creatinine of 1.34, improved to 1.04 with IV fluids  Chronic pain syndrome -Patient on MS Contin 100 mg p.o. twice daily  GERD -Continue Protonix, sucralfate  Disposition -Patient is from skilled nursing facility long-term resident.  Patient has been reluctant to go back to the facility only because she feels that she does not get proper care at her facility.  She was discharged yesterday however she appealed the discharge.  This morning, I was informed by the primary RN that patient has now changed her mind and would like to go back but when I discussed with the TOC and they talked to her, to them she says that she does not want to cancel her appeal.  We are waiting for the decision of the appeal.  Patient has remained stable.  Medications     chlorhexidine gluconate (MEDLINE KIT)  15 mL Mouth/Throat BID   Chlorhexidine Gluconate Cloth  6 each Topical Q0600   enoxaparin (LOVENOX) injection  40 mg Subcutaneous Q24H   famotidine  20 mg Oral QHS   ferrous sulfate  325 mg Oral Q breakfast   ipratropium-albuterol  3 mL Nebulization BID  melatonin  3 mg Oral QHS   metoprolol tartrate  100 mg Oral BID   morphine  100 mg Oral Q12H   mupirocin ointment  1 Application Nasal BID   pantoprazole  40 mg Oral Daily   sertraline  25 mg Oral Daily   sodium chloride flush  3 mL Intravenous Q12H    sucralfate  1 g Oral TID AC     Data Reviewed:   CBG:  No results for input(s): "GLUCAP" in the last 168 hours.  SpO2: (!) 86 % O2 Flow Rate (L/min): 2 L/min FiO2 (%): 28 %    Vitals:   10/29/21 0753 10/29/21 0822 10/29/21 0904 10/29/21 1315  BP: (!) 119/53   111/76  Pulse: 92  (!) 112 (!) 105  Resp: 18   18  Temp: 98 F (36.7 C)   98.8 F (37.1 C)  TempSrc: Oral   Oral  SpO2: 91% 94%  (!) 86%      Data Reviewed:  Basic Metabolic Panel: Recent Labs  Lab 10/24/21 1044 10/25/21 0213 10/26/21 0155 10/27/21 0411 10/28/21 0920  NA 137 139 139 137 137  K 4.0 3.3* 4.1 4.4 4.5  CL 95* 100 103 105 106  CO2 _0 GLUCOSE 121* 100* 99 99 147*  BUN 28* 25* 27* 22 13  CREATININE 1.34* 1.08* 1.04* 0.88 0.80  CALCIUM 9.4 8.4* 9.0 8.8* 9.0     CBC: Recent Labs  Lab 10/24/21 1044 10/26/21 0155 10/27/21 0411  WBC 14.7* 7.2 7.6  HGB 12.8 12.3 12.0  HCT 40.6 37.8 37.1  MCV 97.1 96.2 97.6  PLT 358 334 343     LFT Recent Labs  Lab 10/24/21 1044 10/27/21 0411  AST 21 22  ALT 9 11  ALKPHOS 48 41  BILITOT 0.7 0.4  PROT 7.1 5.7*  ALBUMIN 3.4* 2.6*      Antibiotics: Anti-infectives (From admission, onward)    Start     Dose/Rate Route Frequency Ordered Stop   10/24/21 2200  azithromycin (ZITHROMAX) 500 mg in sodium chloride 0.9 % 250 mL IVPB  Status:  Discontinued        500 mg 250 mL/hr over 60 Minutes Intravenous Every 24 hours 10/24/21 1926 10/25/21 0906   10/24/21 2200  cefTRIAXone (ROCEPHIN) 1 g in sodium chloride 0.9 % 100 mL IVPB  Status:  Discontinued        1 g 200 mL/hr over 30 Minutes Intravenous Every 24 hours 10/24/21 1926 10/25/21 0906   10/24/21 1600  cefTRIAXone (ROCEPHIN) 1 g in sodium chloride 0.9 % 100 mL IVPB  Status:  Discontinued        1 g 200 mL/hr over 30 Minutes Intravenous  Once 10/24/21 1551 10/24/21 1557   10/24/21 1600  azithromycin (ZITHROMAX) 500 mg in sodium chloride 0.9 % 250 mL IVPB  Status:  Discontinued         500 mg 250 mL/hr over 60 Minutes Intravenous  Once 10/24/21 1551 10/24/21 1557   10/24/21 1600  Ampicillin-Sulbactam (UNASYN) 3 g in sodium chloride 0.9 % 100 mL IVPB        3 g 200 mL/hr over 30 Minutes Intravenous  Once 10/24/21 1557 10/24/21 1715        DVT prophylaxis: Lovenox  Code Status: Full code  Family Communication: No family at bedside   CONSULTS cardiology   Objective    Physical Examination:  General exam: Appears calm and comfortable  Respiratory system: Scattered rhonchi. Respiratory  effort normal. Cardiovascular system: S1 & S2 heard, RRR. No JVD, murmurs, rubs, gallops or clicks. No pedal edema. Gastrointestinal system: Abdomen is nondistended, soft and nontender. No organomegaly or masses felt. Normal bowel sounds heard. Central nervous system: Alert and oriented. No focal neurological deficits. Extremities: Symmetric 5 x 5 power. Skin: No rashes, lesions or ulcers.  Psychiatry: Judgement and insight appear normal. Mood & affect appropriate.    Status is: Inpatient: Viral pneumonia  [name]   [facility] If 7PM-7AM, please contact night-coverage at [email or web address], Office  [phone]   10/29/2021, 2:26 PM  LOS: 4 days

## 2021-10-30 MED ORDER — METOPROLOL TARTRATE 5 MG/5ML IV SOLN: 5.0000 mg | INTRAVENOUS | Status: DC | PRN

## 2021-10-30 MED ORDER — SENNOSIDES-DOCUSATE SODIUM 8.6-50 MG PO TABS
1.0000 | ORAL_TABLET | Freq: Every day | ORAL | Status: DC
  Filled 2021-10-30: qty 1

## 2021-10-30 MED ORDER — HYDRALAZINE HCL 20 MG/ML IJ SOLN: 10.0000 mg | INTRAMUSCULAR | Status: DC | PRN

## 2021-10-30 MED ORDER — IPRATROPIUM-ALBUTEROL 0.5-2.5 (3) MG/3ML IN SOLN: 3.0000 mL | Freq: Four times a day (QID) | RESPIRATORY_TRACT | Status: AC | PRN

## 2021-10-30 MED ORDER — TRAZODONE HCL 50 MG PO TABS: 50.0000 mg | ORAL_TABLET | Freq: Every evening | ORAL | Status: DC | PRN

## 2021-10-30 NOTE — TOC Progression Note (Signed)
Transition of Care [facility]) - Progression Note    Patient Details  Name: [name] MRN: [id number] Date of Birth: 1932/09/27  Transition [facility] CM/SW [place], LCSW Phone Number:[phone] 10/30/2021, 2:30 PM  Clinical Narrative:    CSW received a return call from pt's son and he was aware and in agreement with pt returning to [facility] today.CSW discussed that the appeal was denied.  TOC team will continue to assist with discharge planning needs.   Expected Discharge Plan: [place] Barriers to Discharge: Barriers Resolved  Expected Discharge Plan and Services Expected Discharge Plan: [place] arrangements for the past 2 months: [place] Expected Discharge Date: 10/28/21                                     Social Determinants of Health (SDOH) Interventions    Readmission Risk Interventions     No data to display

## 2021-10-30 NOTE — Plan of Care (Signed)

## 2021-10-30 NOTE — Progress Notes (Signed)
Dr. Nevada Crane notified that Pt discharged with out cardiac monitor.  She will notify Cardiac team to have them send monitor to SNF, Dr. Nevada Crane advised to send Pt out.  Pt sent out per Dr. Nevada Crane.

## 2021-10-30 NOTE — Progress Notes (Signed)
Reported given to RN at [facility] place

## 2021-10-30 NOTE — TOC Progression Note (Signed)
Transition of Care [facility]) - Progression Note    Patient Details  Name: [name] MRN: [id number] Date of Birth: 1933-02-18  Transition of Care [facility]) CM/SW [place], LCSW Phone Number:[phone] 10/30/2021, 2:35 PM  Clinical Narrative:    Patient will DC to:?[facility] Anticipated DC date:?10/30/2021 Family notified:?son Transport by: Corey Harold   Per MD patient ready for DC to [facility].RN, patient, patient's family, and facility notified of DC. Discharge Summary sent to facility. RN given number for report  [phone] . DC packet on chart. Ambulance transport requested for patient.  CSW signing off.   [place], [place] [phone]    Expected Discharge Plan: Skilled Nursing Facility Barriers to Discharge: Barriers Resolved  Expected Discharge Plan and Services Expected Discharge Plan: [place] arrangements for the past 2 months: [place] Expected Discharge Date: 10/28/21                                     Social Determinants of Health (SDOH) Interventions    Readmission Risk Interventions     No data to display

## 2021-10-30 NOTE — Progress Notes (Signed)
88 year old female with medical history of anemia, GERD, hypertension, constipation, sinus tachycardia came to ED with complaints of nausea vomiting and abdominal pain.  Also 1 episode of vomiting complain of pain in the epigastric region.  Her C. difficile study was negative, CT abdomen pelvis showed large hiatal hernia with moderate constipation.  Lipase was normal.  Her respiratory panel and remain positive for parainfluenza, procalcitonin was negative.  During hospitalization she also required 2 L nasal cannula therefore home oxygen was arranged.  Hospital stay complicated by atrial fibrillation therefore cardiology team consulted, echocardiogram showed EF 55 to 60% with grade 1 DD.  Cardiology recommended outpatient Zio patch.  Lopressor was increased to 100 mg twice daily.  AKI improved with fluids.  Patient is currently appealed her discharge therefore pending decision.  Eventually her appeal was denied on 7/15 and was deemed medically stable for discharge.  Discharge summary completed by Dr. Doristine Bosworth on 7/13.  I have attached updated discharge medication list below.  Vitals are stable.   Please call with any further questions as needed. Discussed with RN at bedside.  Allergies as of 10/30/2021       Reactions   Ciprofloxacin Rash   burning        Medication List     TAKE these medications    acetaminophen 500 MG tablet Commonly known as: TYLENOL Take 1,000 mg by mouth every 6 (six) hours as needed for moderate pain.   aspirin 81 MG tablet Take 81 mg by mouth at bedtime.   chlorhexidine 0.12 % solution Commonly known as: PERIDEX Use as directed 15 mLs in the mouth or throat 2 (two) times daily.   famotidine 20 MG tablet Commonly known as: PEPCID Take 20 mg by mouth at bedtime.   ferrous sulfate 325 (65 FE) MG tablet Take 325 mg by mouth daily with breakfast.   furosemide 20 MG tablet Commonly known as: LASIX Take 20 mg by mouth daily.   ipratropium-albuterol  0.5-2.5 (3) MG/3ML Soln Commonly known as: DUONEB Take 3 mLs by nebulization every 6 (six) hours as needed.   LACTOBACILLUS PO Take 1 capsule by mouth daily.   loperamide 2 MG capsule Commonly known as: IMODIUM Take 2 mg by mouth every 6 (six) hours as needed for diarrhea or loose stools.   melatonin 3 MG Tabs tablet Take 3 mg by mouth at bedtime.   metoprolol tartrate 50 MG tablet Commonly known as: LOPRESSOR Take 50 mg by mouth 2 (two) times daily.   morphine 100 MG 12 hr tablet Commonly known as: MS CONTIN Take 1 tablet (100 mg total) by mouth every 12 (twelve) hours.   ondansetron 8 MG tablet Commonly known as: ZOFRAN Take 8 mg by mouth every 8 (eight) hours as needed for nausea or vomiting.   OXYGEN Inhale 2 L/min into the lungs. Via nasal cannula   pantoprazole 40 MG tablet Commonly known as: PROTONIX Take 40 mg by mouth daily.   polyethylene glycol 17 g packet Commonly known as: MIRALAX / GLYCOLAX Take 17 g by mouth 2 (two) times daily. What changed:  when to take this reasons to take this   sertraline 25 MG tablet Commonly known as: ZOLOFT Take 25 mg by mouth daily.   sucralfate 1 g tablet Commonly known as: CARAFATE Take 1 g by mouth 3 (three) times daily.   Vitamin D-3 125 MCG (5000 UT) Tabs Take 1 tablet by mouth daily.   Zinc Oxide 10 % Oint Apply 1 Application topically in  the morning and at bedtime.               Durable Medical Equipment  (From admission, onward)           Start     Ordered   10/28/21 1118  For home use only DME oxygen  Once       Question Answer Comment  Length of Need Lifetime   Mode or (Route) Nasal cannula   Liters per Minute 2   Frequency Continuous (stationary and portable oxygen unit needed)   Oxygen delivery system Gas      10/28/21 1117              [name] Arsenio Loader, MD [facility]  If 7PM-7AM, please contact night-coverage  10/30/2021, 7:33 AM

## 2021-10-30 NOTE — Care Management (Signed)
[place] agreed with DC order and financial liability starts today at noon. CSW updated and will coordinate DC.    Per Judithann Graves website:  Overall Status IM Appeal Case: [id number] is complete.  1. Discharge Appeal Started 10/28/2021 12:55:03  2. Medical Record Requested 10/28/2021 16:50:17  3. Documents Received 10/28/2021 14:47:20  4. In Clinical Review 10/28/2021 16:55:06  5. Physician Review Completed 10/29/2021 14:06:42  6. Financial Liability Determined The Physician agreed with the notice. Beneficiary Liability Starts on 10/30/2021.  7. Notification Provider: 10/29/2021 17:26:25 Caller: 10/29/2021 17:16:10  8. Case Complete Yes

## 2021-10-30 NOTE — TOC Progression Note (Addendum)
Transition of Care [facility]) - Progression Note    Patient Details  Name: ANAALICIA REIMANN MRN: [id number] Date of Birth: [birth year]  Transition of Care [facility]) CM/SW Kerr, LCSW Phone Number:[phone] 10/30/2021, 11:16 AM  Clinical Narrative:     CSW was alerted that pt's appeal was denied. CSW attempted to alert pt via phone however was unable to reach her. CSW reached out to RN to have pt to call CSW. CSW spoke with [place] at [facility] to alert her of pt's return. MD and RN was alerted about appeal denial.  11:32am-  was alerted that pt does not want to speak with CSW and wants CSW to speak with her son. CSW called son and had to leave a message.  1:30 PM CSW attempted to follow up with son again and had to leave another VM.  Expected Discharge Plan: [place] Barriers to Discharge: Barriers Resolved  Expected Discharge Plan and Services Expected Discharge Plan: [place] arrangements for the past 2 months: [place] Expected Discharge Date: 10/28/21                                     Social Determinants of Health (SDOH) Interventions    Readmission Risk Interventions     No data to display

## 2021-11-05 ENCOUNTER — Telehealth: Payer: Self-pay | Admitting: Cardiology

## 2021-11-05 NOTE — Telephone Encounter (Signed)
Spoke with Prentiss Bells at [facility] for Nursing and [place] monitor was mailed to patient's home and can be picked up there and brought to the rehab facility. Prentiss Bells stated she will ask patient if someone can get her mail.

## 2021-11-05 NOTE — Telephone Encounter (Signed)
LMTCB. Forwarded patient's rehab name to [facility] heart monitor team.

## 2021-11-05 NOTE — Telephone Encounter (Signed)
[facility] called stating patient was to have a monitor to wear prior to her appt with Dr. Harriet Masson.  However she does not have it, they never received it.   When calling them back please ask to  speak to the director of nursing or the Surveyor, quantity of nursing.

## 2021-11-08 ENCOUNTER — Telehealth: Payer: Self-pay | Admitting: Cardiology

## 2021-11-08 ENCOUNTER — Encounter: Payer: Self-pay | Admitting: *Deleted

## 2021-11-08 NOTE — Progress Notes (Signed)
Patient ID: [name], female   DOB: 04-18-1933, 88 y.o.   MRN: [id number] Patient was enrolled for ZIO patch monitor to be mailed to her address on file 10/28/21.  Monitor was delivered 10/30/21.  Patient is in nursing facility and family went to pick monitor up but it was not at the home. [employer] contacted to cancel [id number] and send a replacement ZIO patch monitor to  [facility], [address] [place], [place] [address], Attn: Director of Nursing.

## 2021-11-08 NOTE — Telephone Encounter (Signed)
Calling in regards to monitor. Stated that is not at the patient's home. And wondering if another can mail out to them. Please advise

## 2021-11-08 NOTE — Telephone Encounter (Signed)
Called and spoke with Jenny Reichmann, she states noone is at the pt's home to check the mailbox. Jenny Reichmann states "it might be in the mailbox but noone has the key." Pt's daughter lives in [place]. Spoke with Darrick Penna from [facility]. She states she can address this today if she has a specific address. Jenny Reichmann gave the facility's address: [address], [place], [place] [address] and sates it can be addressed to Director of Nursing to ensure pt gets the monitor. No further concerns expressed at this time.

## 2021-11-15 DIAGNOSIS — I480 Paroxysmal atrial fibrillation: Secondary | ICD-10-CM | POA: Diagnosis not present

## 2021-11-22 ENCOUNTER — Encounter: Payer: Self-pay | Admitting: Cardiology

## 2021-11-22 ENCOUNTER — Telehealth: Payer: Self-pay | Admitting: Cardiology

## 2021-11-22 ENCOUNTER — Other Ambulatory Visit: Payer: Self-pay

## 2021-11-22 ENCOUNTER — Ambulatory Visit (INDEPENDENT_AMBULATORY_CARE_PROVIDER_SITE_OTHER): Payer: Medicare (Managed Care) | Admitting: Cardiology

## 2021-11-22 VITALS — BP 153/90 | HR 76 | Ht 63.0 in | Wt 115.0 lb

## 2021-11-22 DIAGNOSIS — I484 Atypical atrial flutter: Secondary | ICD-10-CM | POA: Insufficient documentation

## 2021-11-22 DIAGNOSIS — I10 Essential (primary) hypertension: Secondary | ICD-10-CM | POA: Diagnosis not present

## 2021-11-22 MED ORDER — CARVEDILOL 6.25 MG PO TABS: 6.2500 mg | ORAL_TABLET | Freq: Two times a day (BID) | ORAL | 3 refills | Status: AC

## 2021-11-22 NOTE — Patient Instructions (Addendum)
Medication Instructions:  Your physician has recommended you make the following change in your medication:  STOP: Lopressor (Metoprolol) START: Coreg 6.25 mg twice daily *If you need a refill on your cardiac medications before your next appointment, please call your pharmacy*   Lab Work: None If you have labs (blood work) drawn today and your tests are completely normal, you will receive your results only by: [place] (if you have MyChart) OR A paper copy in the mail If you have any lab test that is abnormal or we need to change your treatment, we will call you to review the results.   Testing/Procedures: None   Follow-Up: At [facility], you and your health needs are our priority.  As part of our continuing mission to provide you with exceptional heart care, we have created designated Provider Care Teams.  These Care Teams include your primary Cardiologist (physician) and Advanced Practice Providers (APPs -  Physician Assistants and Nurse Practitioners) who all work together to provide you with the care you need, when you need it.  We recommend signing up for the patient portal called "MyChart".  Sign up information is provided on this After Visit Summary.  MyChart is used to connect with patients for Virtual Visits (Telemedicine).  Patients are able to view lab/test results, encounter notes, upcoming appointments, etc.  Non-urgent messages can be sent to your provider as well.   To learn more about what you can do with MyChart, go to [email or web address].    Your next appointment:   6 month(s)  The format for your next appointment:   In Person  Provider:   Berniece Salines, DO     Other Instructions   Important Information About Sugar

## 2021-11-22 NOTE — Telephone Encounter (Signed)
Pharmacist Audrea Muscat from [facility] stated her records show the patient is deceased and has not had prescription filled in over 10 years. She will fill prescription when patient calls for it. Verified patient's address and birthday with pharmacy.

## 2021-11-22 NOTE — Progress Notes (Signed)
Cardiology Office Note:    Date:  11/22/2021   ID:  [name], DOB November 30, 1932, MRN [id number]  PCP:  No primary care provider on file.  Cardiologist:  Berniece Salines, DO  Electrophysiologist:  None   Referring MD: Alroy Dust, Carlean Jews.Marlou Sa, MD   " I am doing fine"  History of Present Illness:    [name] is a 88 y.o. female with a hx of paroxysmal atrial flutter here today for follow-up visit.  She was recently hospitalized at [facility] where she was treated for acute respiratory failure as well as pneumonia.  During this time was when there was concern for paroxysmal A-fib and eventually there was noted atrial flutter.  She was not anticoagulated because this was transiently with her infection.  She is here today for follow-up visit.  She offers no complaints at this time.  She is very happy she has no local oxygen she has been titrated off.  She is doing very well.  She is wearing her monitor.  Past Medical History:  Diagnosis Date   Anemia 10-05-12   hgb-9.0 on 09-14-12   Arm vein blood clot 10-05-12   Rt. arm '12   Arthritis 10-05-12   degenerative joint disease,scoliosis spine   Aspiration pneumonia ([place])    Dysrhythmia 10-05-12   tachycardia -tx. Lopressor   GERD (gastroesophageal reflux disease)    H/O esophagitis    H/O hiatal hernia    Tachycardia    Transfusion history 10-05-12   4 units blood '12    Past Surgical History:  Procedure Laterality Date   ABDOMINAL HYSTERECTOMY     APPENDECTOMY     child   BACK SURGERY  10-05-12   x5-last fusion with plates   BREAST ENHANCEMENT SURGERY     CATARACT EXTRACTION, BILATERAL     COLONOSCOPY WITH PROPOFOL N/A 10/23/2012   Procedure: COLONOSCOPY WITH PROPOFOL;  Surgeon: Lear Ng, MD;  Location: [facility] ENDOSCOPY;  Service: Endoscopy;  Laterality: N/A;   HOT HEMOSTASIS N/A 10/23/2012   Procedure: HOT HEMOSTASIS (ARGON PLASMA COAGULATION/BICAP);  Surgeon: Lear Ng, MD;  Location: [facility];  Service:  Endoscopy;  Laterality: N/A;    Current Medications: Current Meds  Medication Sig   acetaminophen (TYLENOL) 500 MG tablet Take 1,000 mg by mouth every 6 (six) hours as needed for moderate pain.   albuterol (PROVENTIL) (2.5 MG/3ML) 0.083% nebulizer solution Take by nebulization.   aspirin 81 MG tablet Take 81 mg by mouth at bedtime.    carvedilol (COREG) 6.25 MG tablet Take 1 tablet (6.25 mg total) by mouth 2 (two) times daily.   chlorhexidine (PERIDEX) 0.12 % solution Use as directed 15 mLs in the mouth or throat 2 (two) times daily.   Cholecalciferol (VITAMIN D-3) 125 MCG (5000 UT) TABS Take 1 tablet by mouth daily.   famotidine (PEPCID) 20 MG tablet Take 20 mg by mouth at bedtime.   ferrous sulfate 325 (65 FE) MG tablet Take 325 mg by mouth daily with breakfast.   furosemide (LASIX) 20 MG tablet Take 20 mg by mouth daily.   ipratropium-albuterol (DUONEB) 0.5-2.5 (3) MG/3ML SOLN Take 3 mLs by nebulization every 6 (six) hours as needed.   LACTOBACILLUS PO Take 1 capsule by mouth daily.   loperamide (IMODIUM) 2 MG capsule Take 2 mg by mouth every 6 (six) hours as needed for diarrhea or loose stools.   melatonin 3 MG TABS tablet Take 3 mg by mouth at bedtime.   morphine (MS CONTIN)  100 MG 12 hr tablet Take 1 tablet (100 mg total) by mouth every 12 (twelve) hours.   ondansetron (ZOFRAN) 8 MG tablet Take 8 mg by mouth every 8 (eight) hours as needed for nausea or vomiting.   pantoprazole (PROTONIX) 40 MG tablet Take 40 mg by mouth daily.   polyethylene glycol (MIRALAX / GLYCOLAX) packet Take 17 g by mouth 2 (two) times daily. (Patient taking differently: Take 17 g by mouth daily as needed for moderate constipation.)   PROAIR RESPICLICK 500 (90 Base) MCG/ACT AEPB Inhale into the lungs.   sertraline (ZOLOFT) 25 MG tablet Take 25 mg by mouth daily.   SPIRIVA HANDIHALER 18 MCG inhalation capsule 1 capsule daily.   sucralfate (CARAFATE) 1 g tablet Take 1 g by mouth 3 (three) times daily.   Zinc  Oxide 10 % OINT Apply 1 Application topically in the morning and at bedtime.   [DISCONTINUED] metoprolol (LOPRESSOR) 50 MG tablet Take 50 mg by mouth 2 (two) times daily.     Allergies:   Ciprofloxacin   Social History   Socioeconomic History   Marital status: Widowed    Spouse name: Not on file   Number of children: Not on file   Years of education: Not on file   Highest education level: Not on file  Occupational History   Not on file  Tobacco Use   Smoking status: Never   Smokeless tobacco: Never  Substance and Sexual Activity   Alcohol use: No   Drug use: No   Sexual activity: Not Currently  Other Topics Concern   Not on file  Social History Narrative   Not on file   Social Determinants of Health   Financial Resource Strain: Not on file  Food Insecurity: Not on file  Transportation Needs: Not on file  Physical Activity: Not on file  Stress: Not on file  Social Connections: Not on file     Family History: The patient's family history includes Cancer in her father and mother; Diabetes in her sister. There is no history of Stroke or CAD.  ROS:   Review of Systems  Constitution: Negative for decreased appetite, fever and weight gain.  HENT: Negative for congestion, ear discharge, hoarse voice and sore throat.   Eyes: Negative for discharge, redness, vision loss in right eye and visual halos.  Cardiovascular: Negative for chest pain, dyspnea on exertion, leg swelling, orthopnea and palpitations.  Respiratory: Negative for cough, hemoptysis, shortness of breath and snoring.   Endocrine: Negative for heat intolerance and polyphagia.  Hematologic/Lymphatic: Negative for bleeding problem. Does not bruise/bleed easily.  Skin: Negative for flushing, nail changes, rash and suspicious lesions.  Musculoskeletal: Negative for arthritis, joint pain, muscle cramps, myalgias, neck pain and stiffness.  Gastrointestinal: Negative for abdominal pain, bowel incontinence, diarrhea and  excessive appetite.  Genitourinary: Negative for decreased libido, genital sores and incomplete emptying.  Neurological: Negative for brief paralysis, focal weakness, headaches and loss of balance.  Psychiatric/Behavioral: Negative for altered mental status, depression and suicidal ideas.  Allergic/Immunologic: Negative for HIV exposure and persistent infections.    EKGs/Labs/Other Studies Reviewed:    The following studies were reviewed today:   EKG:  The ekg ordered today demonstrates sinus rhythm  ECHO: 10/25/2021  1. Left ventricular ejection fraction, by estimation, is 55 to 60%. The left ventricle has normal function. Left ventricular endocardial border not optimally defined to evaluate regional wall motion. There is mild left ventricular hypertrophy. Left ventricular diastolic parameters are consistent with Grade I  diastolic dysfunction (impaired relaxation).   2. Right ventricular systolic function is normal. The right ventricular size is normal. Tricuspid regurgitation signal is inadequate for assessing PA pressure.   3. The mitral valve is degenerative. No evidence of mitral valve regurgitation. Moderate mitral annular calcification.   4. The aortic valve was not well visualized. Aortic valve regurgitation  is not visualized. No aortic stenosis is present.   5. The inferior vena cava is normal in size with greater than 50%  respiratory variability, suggesting right atrial pressure of 3 mmHg.   Recent Labs: 01/09/2021: Magnesium 1.8 10/24/2021: B Natriuretic Peptide 524.3; TSH 1.702 10/27/2021: ALT 11; Hemoglobin 12.0; Platelets 343 10/28/2021: BUN 13; Creatinine, Ser 0.80; Potassium 4.5; Sodium 137  Recent Lipid Panel    Component Value Date/Time   CHOL 77 09/08/2010 0535   TRIG 76 09/08/2010 0535   HDL 31 (L) 09/08/2010 0535   CHOLHDL 2.5 09/08/2010 0535   VLDL 15 09/08/2010 0535   LDLCALC  09/08/2010 0535    31        Total Cholesterol/HDL:CHD Risk Coronary Heart Disease  Risk Table                     Men   Women  1/2 Average Risk   3.4   3.3  Average Risk       5.0   4.4  2 X Average Risk   9.6   7.1  3 X Average Risk  23.4   11.0        Use the calculated Patient Ratio above and the CHD Risk Table to determine the patient's CHD Risk.        ATP III CLASSIFICATION (LDL):  <100     mg/dL   Optimal  100-129  mg/dL   Near or Above                    Optimal  130-159  mg/dL   Borderline  160-189  mg/dL   High  >190     mg/dL   Very High    Physical Exam:    VS:  BP (!) 153/90 (BP Location: Right Arm, Patient Position: Sitting, Cuff Size: Normal)   Pulse 76   Ht '5\' 3"'$  (1.6 m)   Wt 115 lb (52.2 kg)   SpO2 100%   BMI 20.37 kg/m     Wt Readings from Last 3 Encounters:  11/22/21 115 lb (52.2 kg)  01/09/21 130 lb (59 kg)  08/25/16 116 lb 6.4 oz (52.8 kg)     GEN: Well nourished, well developed in no acute distress HEENT: Normal NECK: No JVD; No carotid bruits LYMPHATICS: No lymphadenopathy CARDIAC: S1S2 noted,RRR, no murmurs, rubs, gallops RESPIRATORY:  Clear to auscultation without rales, wheezing or rhonchi  ABDOMEN: Soft, non-tender, non-distended, +bowel sounds, no guarding. EXTREMITIES: No edema, No cyanosis, no clubbing MUSCULOSKELETAL:  No deformity  SKIN: Warm and dry NEUROLOGIC:  Alert and oriented x 3, non-focal PSYCHIATRIC:  Normal affect, good insight  ASSESSMENT:    1. Atypical atrial flutter ([place])   2. Primary hypertension    PLAN:    She is still wearing a monitor she takes this off next Monday.  I have explained to the patient and her caregiver that this will need to be mailed out to the company once I get this information I will share with her the patient in the facility.  For now as planned in the hospital no plans for anticoagulation  until we can get information on her monitor.   She is hypertensive in the office today.  I will transition the patient from Lopressor to carvedilol.  Carvedilol 6.25 mg twice  daily.  The patient is in agreement with the above plan. The patient left the office in stable condition.  The patient will follow up in 6 months or sooner if needed.   Medication Adjustments/Labs and Tests Ordered: Current medicines are reviewed at length with the patient today.  Concerns regarding medicines are outlined above.  Orders Placed This Encounter  Procedures   EKG 12-Lead   Meds ordered this encounter  Medications   carvedilol (COREG) 6.25 MG tablet    Sig: Take 1 tablet (6.25 mg total) by mouth 2 (two) times daily.    Dispense:  180 tablet    Refill:  3    Patient Instructions  Medication Instructions:  Your physician has recommended you make the following change in your medication:  STOP: Lopressor (Metoprolol) START: Coreg 6.25 mg twice daily *If you need a refill on your cardiac medications before your next appointment, please call your pharmacy*   Lab Work: None If you have labs (blood work) drawn today and your tests are completely normal, you will receive your results only by: [place] (if you have MyChart) OR A paper copy in the mail If you have any lab test that is abnormal or we need to change your treatment, we will call you to review the results.   Testing/Procedures: None   Follow-Up: At [facility], you and your health needs are our priority.  As part of our continuing mission to provide you with exceptional heart care, we have created designated Provider Care Teams.  These Care Teams include your primary Cardiologist (physician) and Advanced Practice Providers (APPs -  Physician Assistants and Nurse Practitioners) who all work together to provide you with the care you need, when you need it.  We recommend signing up for the patient portal called "MyChart".  Sign up information is provided on this After Visit Summary.  MyChart is used to connect with patients for Virtual Visits (Telemedicine).  Patients are able to view lab/test results,  encounter notes, upcoming appointments, etc.  Non-urgent messages can be sent to your provider as well.   To learn more about what you can do with MyChart, go to [email or web address].    Your next appointment:   6 month(s)  The format for your next appointment:   In Person  Provider:   Berniece Salines, DO     Other Instructions   Important Information About Sugar         Adopting a Healthy Lifestyle.  Know what a healthy weight is for you (roughly BMI <25) and aim to maintain this   Aim for 7+ servings of fruits and vegetables daily   65-80+ fluid ounces of water or unsweet tea for healthy kidneys   Limit to max 1 drink of alcohol per day; avoid smoking/tobacco   Limit animal fats in diet for cholesterol and heart health - choose grass fed whenever available   Avoid highly processed foods, and foods high in saturated/trans fats   Aim for low stress - take time to unwind and care for your mental health   Aim for 150 min of moderate intensity exercise weekly for heart health, and weights twice weekly for bone health   Aim for 7-9 hours of sleep daily   When it comes to diets, agreement about the perfect plan  isnt easy to find, even among the experts. Experts at the [place] developed an idea known as the Healthy Eating Plate. Just imagine a plate divided into logical, healthy portions.   The emphasis is on diet quality:   Load up on vegetables and fruits - one-half of your plate: Aim for color and variety, and remember that potatoes dont count.   Go for whole grains - one-quarter of your plate: Whole wheat, barley, wheat berries, quinoa, oats, brown rice, and foods made with them. If you want pasta, go with whole wheat pasta.   Protein power - one-quarter of your plate: Fish, chicken, beans, and nuts are all healthy, versatile protein sources. Limit red meat.   The diet, however, does go beyond the plate, offering a few other suggestions.    Use healthy plant oils, such as olive, canola, soy, corn, sunflower and peanut. Check the labels, and avoid partially hydrogenated oil, which have unhealthy trans fats.   If youre thirsty, drink water. Coffee and tea are good in moderation, but skip sugary drinks and limit milk and dairy products to one or two daily servings.   The type of carbohydrate in the diet is more important than the amount. Some sources of carbohydrates, such as vegetables, fruits, whole grains, and beans-are healthier than others.   Finally, stay active  Signed, Berniece Salines, DO  11/22/2021 4:08 PM    [facility]

## 2021-11-22 NOTE — Telephone Encounter (Signed)
Pt c/o medication issue:  1. Name of Medication: carvedilol (COREG) 6.25 MG tablet  2. How are you currently taking this medication (dosage and times per day)? 1 tablet twice a day  3. Are you having a reaction (difficulty breathing--STAT)? no  4. What is your medication issue? Audrea Muscat from [facility] states they received a prescription for the patient, but she is deceased in their records. She says she is very sure the patient is deceased and that the medication was sent in for the wrong patient. She says they will not fill the prescription since they know she is deceased. Phone: [phone]

## 2021-11-23 NOTE — Telephone Encounter (Signed)
Called both provided numbers for the pt. No answer, unable to leave a message.

## 2021-12-14 ENCOUNTER — Telehealth: Payer: Self-pay

## 2021-12-14 NOTE — Telephone Encounter (Signed)
Unable to leave message on phone.

## 2022-01-31 ENCOUNTER — Emergency Department (HOSPITAL_COMMUNITY): Payer: Medicare (Managed Care)

## 2022-01-31 ENCOUNTER — Other Ambulatory Visit: Payer: Self-pay

## 2022-01-31 ENCOUNTER — Emergency Department (HOSPITAL_COMMUNITY)
Admission: EM | Admit: 2022-01-31 | Discharge: 2022-01-31 | Disposition: A | Discharge status: Home / Self Care | Payer: Medicare (Managed Care) | Source: Home / Self Care | Attending: Emergency Medicine | Admitting: Emergency Medicine

## 2022-01-31 DIAGNOSIS — Z7982 Long term (current) use of aspirin: Secondary | ICD-10-CM | POA: Insufficient documentation

## 2022-01-31 DIAGNOSIS — R1084 Generalized abdominal pain: Secondary | ICD-10-CM | POA: Insufficient documentation

## 2022-01-31 DIAGNOSIS — D72829 Elevated white blood cell count, unspecified: Secondary | ICD-10-CM | POA: Insufficient documentation

## 2022-01-31 DIAGNOSIS — J189 Pneumonia, unspecified organism: Secondary | ICD-10-CM | POA: Insufficient documentation

## 2022-01-31 DIAGNOSIS — J90 Pleural effusion, not elsewhere classified: Secondary | ICD-10-CM | POA: Insufficient documentation

## 2022-01-31 DIAGNOSIS — I10 Essential (primary) hypertension: Secondary | ICD-10-CM | POA: Insufficient documentation

## 2022-01-31 DIAGNOSIS — Z79899 Other long term (current) drug therapy: Secondary | ICD-10-CM | POA: Insufficient documentation

## 2022-01-31 DIAGNOSIS — R110 Nausea: Secondary | ICD-10-CM

## 2022-01-31 DIAGNOSIS — A419 Sepsis, unspecified organism: Secondary | ICD-10-CM | POA: Diagnosis not present

## 2022-01-31 DIAGNOSIS — R112 Nausea with vomiting, unspecified: Secondary | ICD-10-CM | POA: Insufficient documentation

## 2022-01-31 DIAGNOSIS — R0602 Shortness of breath: Secondary | ICD-10-CM | POA: Diagnosis not present

## 2022-01-31 LAB — CBC
HCT: 42 % (ref 36.0–46.0)
Hemoglobin: 13.3 g/dL (ref 12.0–15.0)
MCH: 31.7 pg (ref 26.0–34.0)
MCHC: 31.7 g/dL (ref 30.0–36.0)
MCV: 100 fL (ref 80.0–100.0)
Platelets: 552 10*3/uL — ABNORMAL HIGH (ref 150–400)
RBC: 4.2 MIL/uL (ref 3.87–5.11)
RDW: 14.2 % (ref 11.5–15.5)
WBC: 19.1 10*3/uL — ABNORMAL HIGH (ref 4.0–10.5)
nRBC: 0 % (ref 0.0–0.2)

## 2022-01-31 LAB — URINALYSIS, ROUTINE W REFLEX MICROSCOPIC
Bilirubin Urine: NEGATIVE
Glucose, UA: NEGATIVE mg/dL
Hgb urine dipstick: NEGATIVE
Ketones, ur: NEGATIVE mg/dL
Leukocytes,Ua: NEGATIVE
Nitrite: NEGATIVE
Protein, ur: NEGATIVE mg/dL
Specific Gravity, Urine: 1.025 (ref 1.005–1.030)
pH: 5 (ref 5.0–8.0)

## 2022-01-31 LAB — LACTIC ACID, PLASMA
Lactic Acid, Venous: 2 mmol/L (ref 0.5–1.9)
Lactic Acid, Venous: 1.8 mmol/L (ref 0.5–1.9)

## 2022-01-31 LAB — COMPREHENSIVE METABOLIC PANEL
ALT: 9 U/L (ref 0–44)
AST: 19 U/L (ref 15–41)
Albumin: 3.8 g/dL (ref 3.5–5.0)
Alkaline Phosphatase: 57 U/L (ref 38–126)
Anion gap: 11 (ref 5–15)
BUN: 20 mg/dL (ref 8–23)
CO2: 25 mmol/L (ref 22–32)
Calcium: 9.8 mg/dL (ref 8.9–10.3)
Chloride: 103 mmol/L (ref 98–111)
Creatinine, Ser: 0.82 mg/dL (ref 0.44–1.00)
GFR, Estimated: >60 mL/min (ref >60)
Glucose, Bld: 149 mg/dL — ABNORMAL HIGH (ref 70–99)
Potassium: 3.8 mmol/L (ref 3.5–5.1)
Sodium: 139 mmol/L (ref 135–145)
Total Bilirubin: 0.3 mg/dL (ref 0.3–1.2)
Total Protein: 7.4 g/dL (ref 6.5–8.1)

## 2022-01-31 LAB — LIPASE, BLOOD: Lipase: 25 U/L (ref 11–51)

## 2022-01-31 MED ORDER — ACETAMINOPHEN 325 MG PO TABS
650.0000 mg | ORAL_TABLET | Freq: Once | ORAL | Status: AC
  Administered 2022-01-31: 650 mg via ORAL
  Filled 2022-01-31: qty 2

## 2022-01-31 MED ORDER — OXYCODONE HCL 5 MG PO TABS
5.0000 mg | ORAL_TABLET | Freq: Once | ORAL | Status: AC
  Administered 2022-01-31: 5 mg via ORAL
  Filled 2022-01-31: qty 1

## 2022-01-31 MED ORDER — AZITHROMYCIN 250 MG PO TABS: 250.0000 mg | ORAL_TABLET | Freq: Every day | ORAL | 0 refills | Status: DC

## 2022-01-31 MED ORDER — METOCLOPRAMIDE HCL 10 MG PO TABS: 10.0000 mg | ORAL_TABLET | Freq: Four times a day (QID) | ORAL | 0 refills | Status: DC

## 2022-01-31 MED ORDER — LACTATED RINGERS IV BOLUS
1000.0000 mL | Freq: Once | INTRAVENOUS | Status: AC
  Administered 2022-01-31: 1000 mL via INTRAVENOUS

## 2022-01-31 MED ORDER — METOCLOPRAMIDE HCL 5 MG/ML IJ SOLN
10.0000 mg | Freq: Once | INTRAMUSCULAR | Status: AC
  Administered 2022-01-31: 10 mg via INTRAVENOUS
  Filled 2022-01-31: qty 2

## 2022-01-31 MED ORDER — FAMOTIDINE IN NACL 20-0.9 MG/50ML-% IV SOLN
20.0000 mg | Freq: Once | INTRAVENOUS | Status: AC
  Administered 2022-01-31: 20 mg via INTRAVENOUS
  Filled 2022-01-31: qty 50

## 2022-01-31 MED ORDER — MORPHINE SULFATE ER 100 MG PO TBCR
100.0000 mg | EXTENDED_RELEASE_TABLET | Freq: Once | ORAL | Status: AC
  Administered 2022-01-31: 100 mg via ORAL
  Filled 2022-01-31: qty 1

## 2022-01-31 MED ORDER — METOCLOPRAMIDE HCL 10 MG PO TABS: 10.0000 mg | ORAL_TABLET | Freq: Four times a day (QID) | ORAL | 0 refills | Status: AC

## 2022-01-31 MED ORDER — AMOXICILLIN-POT CLAVULANATE 875-125 MG PO TABS
1.0000 | ORAL_TABLET | Freq: Once | ORAL | Status: AC
  Administered 2022-01-31: 1 via ORAL
  Filled 2022-01-31: qty 1

## 2022-01-31 MED ORDER — ONDANSETRON 4 MG PO TBDP
4.0000 mg | ORAL_TABLET | Freq: Once | ORAL | Status: AC
  Administered 2022-01-31: 4 mg via ORAL
  Filled 2022-01-31: qty 1

## 2022-01-31 MED ORDER — IOHEXOL 350 MG/ML SOLN
100.0000 mL | Freq: Once | INTRAVENOUS | Status: AC | PRN
  Administered 2022-01-31: 100 mL via INTRAVENOUS

## 2022-01-31 MED ORDER — AMOXICILLIN-POT CLAVULANATE 875-125 MG PO TABS: 1.0000 | ORAL_TABLET | Freq: Two times a day (BID) | ORAL | 0 refills | Status: DC

## 2022-01-31 MED ORDER — AMOXICILLIN-POT CLAVULANATE 875-125 MG PO TABS: 1.0000 | ORAL_TABLET | Freq: Two times a day (BID) | ORAL | 0 refills | Status: AC

## 2022-01-31 MED ORDER — AZITHROMYCIN 250 MG PO TABS
500.0000 mg | ORAL_TABLET | Freq: Once | ORAL | Status: AC
  Administered 2022-01-31: 500 mg via ORAL
  Filled 2022-01-31: qty 2

## 2022-01-31 MED ORDER — AZITHROMYCIN 250 MG PO TABS: 250.0000 mg | ORAL_TABLET | Freq: Every day | ORAL | 0 refills | Status: AC

## 2022-01-31 NOTE — Discharge Instructions (Addendum)
You were seen in the emergency department for your abdominal pain.  Your work-up shows that you have a pneumonia with some fluid on your lung.  I have given you antibiotics and you should complete this as prescribed.  We did give you your first dose of antibiotics in the ER today, so you can take your next dose of the Augmentin tonight and the azithromycin starting tomorrow.  I have also given you a prescription for nausea medicine and you can take this as needed.  You should follow-up with your primary doctor in the next few days to have your symptoms rechecked.  You should return to the emergency department if you have significantly worsening shortness of breath, repetitive vomiting and cannot tolerate the antibiotics, you become confused or if you have any other new or concerning symptoms.

## 2022-01-31 NOTE — ED Provider Notes (Signed)
[facility] EMERGENCY DEPARTMENT Provider Note   CSN: [id number] Arrival date & time: 01/31/22  3154     History  Chief Complaint  Patient presents with   Abdominal Pain   Emesis   Nausea    [name] is a 88 y.o. female.  Patient is an 88 year old female with a past medical history of atrial flutter not currently on anticoagulation, hypertension, and recent back surgery presenting to the emergency department from SNF with abdominal pain.  Patient states that she woke up from her sleep this morning with severe abdominal pain, worse across her upper abdomen.  She states she had associated nausea with 2 episodes of vomiting.  She denies any fevers or chills, dysuria or hematuria.  She states that she has been constipated due to the opiates that she is taking post surgery but did have a bowel movement this morning. Of note, nursing home staff does report that she frequently has abdominal pain with prior episodes of pneumonia.  The history is provided by the patient and the nursing home.  Abdominal Pain Associated symptoms: vomiting   Emesis Associated symptoms: abdominal pain        Home Medications Prior to Admission medications   Medication Sig Start Date End Date Taking? Authorizing Provider  amoxicillin-clavulanate (AUGMENTIN) 875-125 MG tablet Take 1 tablet by mouth every 12 (twelve) hours for 5 days. 01/31/22 02/05/22 Yes Kingsley, Quesada, DO  azithromycin (ZITHROMAX) 250 MG tablet Take 1 tablet (250 mg total) by mouth daily for 4 days. Take first 2 tablets together, then 1 every day until finished. 01/31/22 02/04/22 Yes Maylon Peppers, Jordan Hawks K, DO  metoCLOPramide (REGLAN) 10 MG tablet Take 1 tablet (10 mg total) by mouth every 6 (six) hours. 01/31/22  Yes Leanord Asal K, DO  acetaminophen (TYLENOL) 500 MG tablet Take 1,000 mg by mouth every 6 (six) hours as needed for moderate pain.    [provider]  albuterol (PROVENTIL) (2.5 MG/3ML)  0.083% nebulizer solution Take by nebulization. 11/01/21   [provider]  aspirin 81 MG tablet Take 81 mg by mouth at bedtime.     [provider]  carvedilol (COREG) 6.25 MG tablet Take 1 tablet (6.25 mg total) by mouth 2 (two) times daily. 11/22/21   Tobb, Kardie, DO  chlorhexidine (PERIDEX) 0.12 % solution Use as directed 15 mLs in the mouth or throat 2 (two) times daily. 09/27/21   [provider]  Cholecalciferol (VITAMIN D-3) 125 MCG (5000 UT) TABS Take 1 tablet by mouth daily.    [provider]  famotidine (PEPCID) 20 MG tablet Take 20 mg by mouth at bedtime. 09/22/21   [provider]  ferrous sulfate 325 (65 FE) MG tablet Take 325 mg by mouth daily with breakfast.    [provider]  furosemide (LASIX) 20 MG tablet Take 20 mg by mouth daily. 09/17/21   [provider]  ipratropium-albuterol (DUONEB) 0.5-2.5 (3) MG/3ML SOLN Take 3 mLs by nebulization every 6 (six) hours as needed. 10/30/21   Amin, Jeanella Flattery, MD  LACTOBACILLUS PO Take 1 capsule by mouth daily.    [provider]  loperamide (IMODIUM) 2 MG capsule Take 2 mg by mouth every 6 (six) hours as needed for diarrhea or loose stools.    [provider]  melatonin 3 MG TABS tablet Take 3 mg by mouth at bedtime.    [provider]  morphine (MS CONTIN) 100 MG 12 hr tablet Take 1 tablet (100  mg total) by mouth every 12 (twelve) hours. 10/28/21   [name], MD  ondansetron (ZOFRAN) 8 MG tablet Take 8 mg by mouth every 8 (eight) hours as needed for nausea or vomiting.    [provider]  OXYGEN Inhale 2 L/min into the lungs. Via nasal cannula Patient not taking: Reported on 11/22/2021    [provider]  pantoprazole (PROTONIX) 40 MG tablet Take 40 mg by mouth daily.    [provider]  polyethylene glycol (MIRALAX / GLYCOLAX) packet Take 17 g by mouth 2 (two) times daily. Patient taking differently: Take 17 g by mouth daily  as needed for moderate constipation. 03/26/16   Charlynne Cousins, MD  PROAIR RESPICLICK 536 [phone] Base) MCG/ACT AEPB Inhale into the lungs. 06/09/21   [provider]  sertraline (ZOLOFT) 25 MG tablet Take 25 mg by mouth daily. 10/17/21   [provider]  SPIRIVA HANDIHALER 18 MCG inhalation capsule 1 capsule daily. 11/08/21   [provider]  sucralfate (CARAFATE) 1 g tablet Take 1 g by mouth 3 (three) times daily. 10/10/21   [provider]  Zinc Oxide 10 % OINT Apply 1 Application topically in the morning and at bedtime.    [provider]      Allergies    Ciprofloxacin    Review of Systems   Review of Systems  Gastrointestinal:  Positive for abdominal pain and vomiting.    Physical Exam Updated Vital Signs BP (!) 142/127   Pulse 78   Temp 97.6 F (36.4 C) (Oral)   Resp 18   Ht '5\' 3"'$  (1.6 m)   Wt 53.5 kg   SpO2 96%   BMI 20.90 kg/m  Physical Exam Vitals and nursing note reviewed.  Constitutional:      Appearance: She is well-developed.     Comments: Initially seen in triage writhing around in pain uncomfortable appearing. When reassessed in ED room patient now comfortable appearing in no acute distress  HENT:     Head: Normocephalic and atraumatic.     Mouth/Throat:     Mouth: Mucous membranes are moist.     Pharynx: Oropharynx is clear.  Eyes:     Extraocular Movements: Extraocular movements intact.  Cardiovascular:     Rate and Rhythm: Normal rate and regular rhythm.     Heart sounds: Normal heart sounds.  Pulmonary:     Effort: Pulmonary effort is normal.     Breath sounds: Normal breath sounds.  Abdominal:     General: Abdomen is flat.     Palpations: Abdomen is soft.     Tenderness: There is generalized abdominal tenderness. There is no right CVA tenderness, left CVA tenderness, guarding or rebound.  Skin:    General: Skin is warm and dry.  Neurological:     General: No focal deficit present.     Mental Status:  She is alert and oriented to person, place, and time.  Psychiatric:        Mood and Affect: Mood normal.        Behavior: Behavior normal.     ED Results / Procedures / Treatments   Labs (all labs ordered are listed, but only abnormal results are displayed) Labs Reviewed  COMPREHENSIVE METABOLIC PANEL - Abnormal; Notable for the following components:      Result Value   Glucose, Bld 149 (*)    All other components within normal limits  CBC - Abnormal; Notable for the following components:   WBC  19.1 (*)    Platelets 552 (*)    All other components within normal limits  URINALYSIS, ROUTINE W REFLEX MICROSCOPIC - Abnormal; Notable for the following components:   APPearance HAZY (*)    All other components within normal limits  LACTIC ACID, PLASMA - Abnormal; Notable for the following components:   Lactic Acid, Venous 2.0 (*)    All other components within normal limits  LIPASE, BLOOD  LACTIC ACID, PLASMA    EKG None  Radiology DG Chest 2 View  Result Date: 01/31/2022 CLINICAL DATA:  Upper abdominal pain EXAM: CHEST - 2 VIEW COMPARISON:  Previous studies including the examination of 10/28/2021 FINDINGS: Transverse diameter of heart is slightly increased. Large fixed hiatal hernia is seen. There is increased haziness in left lower lung fields suggesting increase in left pleural effusion and possibly worsening of infiltrate. Rest of the lung fields are essentially clear. There are no signs of alveolar pulmonary edema. Right hemidiaphragm is elevated which may account for some crowding of markings in right lower lung field. IMPRESSION: There is increased opacity in left lower lung fields suggesting increase in pleural effusion and possibly increase in underlying infiltrate. Electronically Signed   By: Elmer Picker M.D.   On: 01/31/2022 12:31   CT Angio Abd/Pel W and/or Wo Contrast  Result Date: 01/31/2022 CLINICAL DATA:  Mesenteric ischemia EXAM: CTA ABDOMEN AND PELVIS  WITHOUT AND WITH CONTRAST TECHNIQUE: Multidetector CT imaging of the abdomen and pelvis was performed using the standard protocol during bolus administration of intravenous contrast. Multiplanar reconstructed images and MIPs were obtained and reviewed to evaluate the vascular anatomy. RADIATION DOSE REDUCTION: This exam was performed according to the departmental dose-optimization program which includes automated exposure control, adjustment of the mA and/or kV according to patient size and/or use of iterative reconstruction technique. CONTRAST:  157m OMNIPAQUE IOHEXOL 350 MG/ML SOLN COMPARISON:  10/24/2021 FINDINGS: VASCULAR Aorta: Normal caliber aorta without aneurysm, dissection, vasculitis or significant stenosis. Celiac: Mild narrowing at the origin due to calcifications, but otherwise patent. SMA: Patent without evidence of aneurysm, dissection, vasculitis or significant stenosis. Renals: Both renal arteries are patent without evidence of aneurysm, dissection, vasculitis, fibromuscular dysplasia or significant stenosis. IMA: Patent without evidence of aneurysm, dissection, vasculitis or significant stenosis. Inflow: Patent without evidence of aneurysm, dissection, vasculitis or significant stenosis. Proximal Outflow: Bilateral common femoral and visualized portions of the superficial and profunda femoral arteries are patent without evidence of aneurysm, dissection, vasculitis or significant stenosis. Veins: Hepatic, portal, splenic, superior mesenteric veins are patent. Inferior vena cava is patent. Review of the MIP images confirms the above findings. NON-VASCULAR Lower chest: Patchy opacities at the lung bases likely combination of mild pneumonitis and atelectasis. Hepatobiliary: No focal liver abnormality is seen. No gallstones, gallbladder wall thickening, or biliary dilatation. Pancreas: Unremarkable. No pancreatic ductal dilatation or surrounding inflammatory changes. Spleen: Normal in size without  focal abnormality. Adrenals/Urinary Tract: Adrenal glands are normal. Subcentimeter renal hypodensities are too small to fully characterize. No hydronephrosis or hydroureter. Bladder is normal. Stomach/Bowel: Large hiatal hernia. Small amount of fluid noted adjacent to the hiatal hernia. No bowel dilatation to indicate ileus or obstruction. Mild diverticulosis of the sigmoid colon without evidence of acute diverticulitis. Appendix is not definitely identified. Lymphatic: No enlarged abdominal or pelvic lymph nodes. Reproductive: Uterus is atrophic versus absent. No adnexal abnormality is identified. Other: Bilateral breast prostheses partially visualized. Interval development of small amount of ascites. Musculoskeletal: L1-S1 fusion changes are seen. No acute osseous abnormality. IMPRESSION: 1.  No evidence of mesenteric ischemia. NON-VASCULAR 1. Interval development of small amount of ascites, which extends into the thorax alongside the large hiatal hernia. 2. Mild diverticulosis of the sigmoid colon without evidence of acute diverticulitis. 3. Patchy opacities at the lung bases likely combination of mild pneumonitis and atelectasis. Electronically Signed   By: Miachel Roux M.D.   On: 01/31/2022 11:26    Procedures Procedures    Medications Ordered in ED Medications  ondansetron (ZOFRAN-ODT) disintegrating tablet 4 mg (4 mg Oral Given 01/31/22 0807)  metoCLOPramide (REGLAN) injection 10 mg (10 mg Intravenous Given 01/31/22 0954)  iohexol (OMNIPAQUE) 350 MG/ML injection 100 mL (100 mLs Intravenous Contrast Given 01/31/22 1033)  lactated ringers bolus 1,000 mL (1,000 mLs Intravenous New Bag/Given 01/31/22 1228)  metoCLOPramide (REGLAN) injection 10 mg (10 mg Intravenous Given 01/31/22 1254)  acetaminophen (TYLENOL) tablet 650 mg (650 mg Oral Given 01/31/22 1251)  famotidine (PEPCID) IVPB 20 mg premix (20 mg Intravenous New Bag/Given 01/31/22 1259)  amoxicillin-clavulanate (AUGMENTIN) 875-125 MG per  tablet 1 tablet (1 tablet Oral Given 01/31/22 1252)  azithromycin (ZITHROMAX) tablet 500 mg (500 mg Oral Given 01/31/22 1253)    ED Course/ Medical Decision Making/ A&P Clinical Course as of 01/31/22 1330  Mon Jan 31, 2022  1238 Opacity in the left lower lung with pleural effusion, concerning for possible pneumonia. [VK]  1302 UA negative, given abx for pneumonia. Pending repeat lactic acid with plan for likely dc back to NH. [VK]    Clinical Course User Index [VK] Kemper Durie, DO                           Medical Decision Making This patient presents to the ED with chief complaint(s) of abdominal pain with pertinent past medical history of atrial flutter not on anticoagulation, hypertension, recent back surgery which further complicates the presenting complaint. The complaint involves an extensive differential diagnosis and also carries with it a high risk of complications and morbidity.    The differential diagnosis includes patient is initially very uncomfortable appearing and writhing around in bed, concerning for possible mesenteric ischemia or other acute intra-abdominal infection, patient denies any black or bloody stool but considering peptic ulcer disease, gastritis or GERD, atypical ACS, pneumonia, UTI, dehydration  Additional history obtained: Additional history obtained from nursing home/care facility Records reviewed previous admission documents  ED Course and Reassessment: Patient was initially seen in triage writhing around in pain.  She did receive Reglan with improvement of her nausea and abdominal pain.  CT is pending at this time.  Labs showed leukocytosis.  Additional lactate will be performed.  Urine is pending.  Independent labs interpretation:  The following labs were independently interpreted: Leukocytosis, mild lactate elevation that normalized on trending  Independent visualization of imaging: - I independently visualized the following imaging with  scope of interpretation limited to determining acute life threatening conditions related to emergency care: CT AP, chest x-ray, which revealed left lower lobe pneumonia/pleural effusion  Consultation: - Consulted or discussed management/test interpretation w/ external professional: N/A  Consideration for admission or further workup: Patient is stable for discharge to her nursing home.  She was given strict return precautions. Social Determinants of health: N/A    Amount and/or Complexity of Data Reviewed Labs: ordered. Radiology: ordered.  Risk OTC drugs. Prescription drug management.          Final Clinical Impression(s) / ED Diagnoses Final diagnoses:  Nausea  Pleural effusion on  left  Pneumonia of left lower lobe due to infectious organism    Rx / DC Orders ED Discharge Orders          Ordered    amoxicillin-clavulanate (AUGMENTIN) 875-125 MG tablet  Every 12 hours        01/31/22 1328    azithromycin (ZITHROMAX) 250 MG tablet  Daily        01/31/22 1328    metoCLOPramide (REGLAN) 10 MG tablet  Every 6 hours        01/31/22 1329              Leanord Asal K, DO 01/31/22 1330

## 2022-01-31 NOTE — ED Triage Notes (Signed)
Pt. Given Reglan IV, pt stated, her pain a little better.

## 2022-01-31 NOTE — ED Notes (Signed)
Patient transported to CT 

## 2022-01-31 NOTE — ED Notes (Signed)
Pt in xray

## 2022-01-31 NOTE — ED Provider Triage Note (Signed)
Emergency Medicine Provider Triage Evaluation Note  [name] , a 89 y.o. female  was evaluated in triage.  Pt complains of abdominal pain since 5 AM this morning.  Patient reports he typically gets about 4:30 AM however this morning she was awake and had sudden onset abdominal pain located across her lower abdominal fields.  The patient states she is at a SNF for back surgery, states she chronically has back pain and takes opioids for this.  The patient states that she has had a successful bowel movement since being in the ED this morning which is nonbloody.  Patient denies any fevers however does endorse nausea and vomiting x2 this morning.  Patient denies any dysuria, chest pain, shortness of breath.  Patient moaning in triage.  There seems to be pain out of proportion of examination.  CT angio abdomen has been ordered.  Patient has been examined by Dr. Maylon Peppers who voices agreement with work-up.  Review of Systems  Positive:  Negative:   Physical Exam  BP (!) 160/92 (BP Location: Right Arm)   Pulse 69   Temp 98.5 F (36.9 C)   Resp 15   Ht '5\' 3"'$  (1.6 m)   Wt 53.5 kg   SpO2 97%   BMI 20.90 kg/m  Gen:   Awake, no distress   Resp:  Normal effort  MSK:   Moves extremities without difficulty  Other:    Medical Decision Making  Medically screening exam initiated at 9:35 AM.  Appropriate orders placed.  [name] was informed that the remainder of the evaluation will be completed by another provider, this initial triage assessment does not replace that evaluation, and the importance of remaining in the ED until their evaluation is complete.     Azucena Cecil, PA-C 01/31/22 [phone]

## 2022-01-31 NOTE — ED Notes (Signed)
Pt upset that she is still here. Pt informed she's not forgotten about, and I obtained her evening MS contin. Also informed pt that I ordered her a meal tray, even though she said she was not hungry. Turned lights up for pt, adjusted her bed.

## 2022-01-31 NOTE — ED Triage Notes (Signed)
EMS stated pt has abdominal pain, with N/V  started at 0500 this morning. Denies any other symptoms.

## 2022-02-03 ENCOUNTER — Emergency Department (HOSPITAL_COMMUNITY): Payer: Medicare (Managed Care)

## 2022-02-03 ENCOUNTER — Encounter (HOSPITAL_COMMUNITY): Payer: Self-pay

## 2022-02-03 ENCOUNTER — Other Ambulatory Visit: Payer: Self-pay

## 2022-02-03 ENCOUNTER — Inpatient Hospital Stay (HOSPITAL_COMMUNITY)
Admission: EM | Admit: 2022-02-03 | Discharge: 2022-02-16 | DRG: 871 | Disposition: E | Payer: Medicare (Managed Care) | Source: Skilled Nursing Facility | Attending: Internal Medicine | Admitting: Internal Medicine

## 2022-02-03 DIAGNOSIS — R627 Adult failure to thrive: Secondary | ICD-10-CM | POA: Diagnosis present

## 2022-02-03 DIAGNOSIS — K219 Gastro-esophageal reflux disease without esophagitis: Secondary | ICD-10-CM | POA: Diagnosis present

## 2022-02-03 DIAGNOSIS — J189 Pneumonia, unspecified organism: Secondary | ICD-10-CM | POA: Diagnosis present

## 2022-02-03 DIAGNOSIS — Z515 Encounter for palliative care: Secondary | ICD-10-CM | POA: Diagnosis not present

## 2022-02-03 DIAGNOSIS — T40605A Adverse effect of unspecified narcotics, initial encounter: Secondary | ICD-10-CM | POA: Diagnosis present

## 2022-02-03 DIAGNOSIS — Z9842 Cataract extraction status, left eye: Secondary | ICD-10-CM

## 2022-02-03 DIAGNOSIS — Z5320 Procedure and treatment not carried out because of patient's decision for unspecified reasons: Secondary | ICD-10-CM | POA: Diagnosis not present

## 2022-02-03 DIAGNOSIS — J9621 Acute and chronic respiratory failure with hypoxia: Secondary | ICD-10-CM | POA: Diagnosis present

## 2022-02-03 DIAGNOSIS — I5033 Acute on chronic diastolic (congestive) heart failure: Secondary | ICD-10-CM | POA: Diagnosis present

## 2022-02-03 DIAGNOSIS — K56609 Unspecified intestinal obstruction, unspecified as to partial versus complete obstruction: Secondary | ICD-10-CM

## 2022-02-03 DIAGNOSIS — J9601 Acute respiratory failure with hypoxia: Secondary | ICD-10-CM | POA: Diagnosis not present

## 2022-02-03 DIAGNOSIS — I110 Hypertensive heart disease with heart failure: Secondary | ICD-10-CM | POA: Diagnosis present

## 2022-02-03 DIAGNOSIS — E876 Hypokalemia: Secondary | ICD-10-CM | POA: Diagnosis present

## 2022-02-03 DIAGNOSIS — I480 Paroxysmal atrial fibrillation: Secondary | ICD-10-CM | POA: Diagnosis present

## 2022-02-03 DIAGNOSIS — Z993 Dependence on wheelchair: Secondary | ICD-10-CM | POA: Diagnosis not present

## 2022-02-03 DIAGNOSIS — R6521 Severe sepsis with septic shock: Secondary | ICD-10-CM | POA: Diagnosis not present

## 2022-02-03 DIAGNOSIS — K56600 Partial intestinal obstruction, unspecified as to cause: Secondary | ICD-10-CM | POA: Diagnosis present

## 2022-02-03 DIAGNOSIS — Z20822 Contact with and (suspected) exposure to covid-19: Secondary | ICD-10-CM | POA: Diagnosis present

## 2022-02-03 DIAGNOSIS — Z66 Do not resuscitate: Secondary | ICD-10-CM | POA: Diagnosis not present

## 2022-02-03 DIAGNOSIS — R64 Cachexia: Secondary | ICD-10-CM | POA: Diagnosis present

## 2022-02-03 DIAGNOSIS — M549 Dorsalgia, unspecified: Secondary | ICD-10-CM | POA: Diagnosis present

## 2022-02-03 DIAGNOSIS — Z7982 Long term (current) use of aspirin: Secondary | ICD-10-CM

## 2022-02-03 DIAGNOSIS — G894 Chronic pain syndrome: Secondary | ICD-10-CM | POA: Diagnosis present

## 2022-02-03 DIAGNOSIS — A419 Sepsis, unspecified organism: Secondary | ICD-10-CM | POA: Diagnosis present

## 2022-02-03 DIAGNOSIS — K5903 Drug induced constipation: Secondary | ICD-10-CM | POA: Diagnosis present

## 2022-02-03 DIAGNOSIS — E861 Hypovolemia: Secondary | ICD-10-CM | POA: Diagnosis present

## 2022-02-03 DIAGNOSIS — R570 Cardiogenic shock: Secondary | ICD-10-CM | POA: Diagnosis not present

## 2022-02-03 DIAGNOSIS — R579 Shock, unspecified: Secondary | ICD-10-CM | POA: Diagnosis not present

## 2022-02-03 DIAGNOSIS — Z79899 Other long term (current) drug therapy: Secondary | ICD-10-CM

## 2022-02-03 DIAGNOSIS — M419 Scoliosis, unspecified: Secondary | ICD-10-CM | POA: Diagnosis present

## 2022-02-03 DIAGNOSIS — R0602 Shortness of breath: Secondary | ICD-10-CM | POA: Diagnosis present

## 2022-02-03 DIAGNOSIS — E43 Unspecified severe protein-calorie malnutrition: Secondary | ICD-10-CM | POA: Diagnosis present

## 2022-02-03 DIAGNOSIS — E871 Hypo-osmolality and hyponatremia: Secondary | ICD-10-CM | POA: Diagnosis present

## 2022-02-03 DIAGNOSIS — I484 Atypical atrial flutter: Secondary | ICD-10-CM | POA: Diagnosis present

## 2022-02-03 DIAGNOSIS — Z79891 Long term (current) use of opiate analgesic: Secondary | ICD-10-CM

## 2022-02-03 DIAGNOSIS — J690 Pneumonitis due to inhalation of food and vomit: Secondary | ICD-10-CM | POA: Diagnosis present

## 2022-02-03 DIAGNOSIS — Z7189 Other specified counseling: Secondary | ICD-10-CM | POA: Diagnosis not present

## 2022-02-03 DIAGNOSIS — Z7401 Bed confinement status: Secondary | ICD-10-CM

## 2022-02-03 DIAGNOSIS — Z881 Allergy status to other antibiotic agents status: Secondary | ICD-10-CM

## 2022-02-03 DIAGNOSIS — Z9981 Dependence on supplemental oxygen: Secondary | ICD-10-CM

## 2022-02-03 DIAGNOSIS — Z9049 Acquired absence of other specified parts of digestive tract: Secondary | ICD-10-CM

## 2022-02-03 DIAGNOSIS — Z9841 Cataract extraction status, right eye: Secondary | ICD-10-CM

## 2022-02-03 DIAGNOSIS — Z90710 Acquired absence of both cervix and uterus: Secondary | ICD-10-CM

## 2022-02-03 LAB — CBC WITH DIFFERENTIAL/PLATELET
Abs Immature Granulocytes: 0.05 10*3/uL (ref 0.00–0.07)
Basophils Absolute: 0 10*3/uL (ref 0.0–0.1)
Basophils Relative: 0 %
Eosinophils Absolute: 0 10*3/uL (ref 0.0–0.5)
Eosinophils Relative: 0 %
HCT: 38.4 % (ref 36.0–46.0)
Hemoglobin: 12.2 g/dL (ref 12.0–15.0)
Immature Granulocytes: 0 %
Lymphocytes Relative: 5 %
Lymphs Abs: 0.6 10*3/uL — ABNORMAL LOW (ref 0.7–4.0)
MCH: 31.3 pg (ref 26.0–34.0)
MCHC: 31.8 g/dL (ref 30.0–36.0)
MCV: 98.5 fL (ref 80.0–100.0)
Monocytes Absolute: 0.1 10*3/uL (ref 0.1–1.0)
Monocytes Relative: 1 %
Neutro Abs: 11.6 10*3/uL — ABNORMAL HIGH (ref 1.7–7.7)
Neutrophils Relative %: 94 %
Platelets: 406 10*3/uL — ABNORMAL HIGH (ref 150–400)
RBC: 3.9 MIL/uL (ref 3.87–5.11)
RDW: 14.1 % (ref 11.5–15.5)
WBC: 12.4 10*3/uL — ABNORMAL HIGH (ref 4.0–10.5)
nRBC: 0 % (ref 0.0–0.2)

## 2022-02-03 LAB — COMPREHENSIVE METABOLIC PANEL
ALT: 10 U/L (ref 0–44)
AST: 19 U/L (ref 15–41)
Albumin: 3.3 g/dL — ABNORMAL LOW (ref 3.5–5.0)
Alkaline Phosphatase: 53 U/L (ref 38–126)
Anion gap: 10 (ref 5–15)
BUN: 36 mg/dL — ABNORMAL HIGH (ref 8–23)
CO2: 23 mmol/L (ref 22–32)
Calcium: 9.4 mg/dL (ref 8.9–10.3)
Chloride: 100 mmol/L (ref 98–111)
Creatinine, Ser: 1 mg/dL (ref 0.44–1.00)
GFR, Estimated: 55 mL/min — ABNORMAL LOW (ref >60)
Glucose, Bld: 149 mg/dL — ABNORMAL HIGH (ref 70–99)
Potassium: 4.3 mmol/L (ref 3.5–5.1)
Sodium: 133 mmol/L — ABNORMAL LOW (ref 135–145)
Total Bilirubin: 0.9 mg/dL (ref 0.3–1.2)
Total Protein: 7.4 g/dL (ref 6.5–8.1)

## 2022-02-03 LAB — BLOOD GAS, VENOUS
Acid-Base Excess: 0.7 mmol/L (ref 0.0–2.0)
Bicarbonate: 25.4 mmol/L (ref 20.0–28.0)
O2 Saturation: 76.9 %
Patient temperature: 37
pCO2, Ven: 40 mmHg — ABNORMAL LOW (ref 44–60)
pH, Ven: 7.41 (ref 7.25–7.43)
pO2, Ven: 46 mmHg — ABNORMAL HIGH (ref 32–45)

## 2022-02-03 LAB — I-STAT CHEM 8, ED
BUN: 35 mg/dL — ABNORMAL HIGH (ref 8–23)
Calcium, Ion: 1.25 mmol/L (ref 1.15–1.40)
Chloride: 99 mmol/L (ref 98–111)
Creatinine, Ser: 0.9 mg/dL (ref 0.44–1.00)
Glucose, Bld: 145 mg/dL — ABNORMAL HIGH (ref 70–99)
HCT: 40 % (ref 36.0–46.0)
Hemoglobin: 13.6 g/dL (ref 12.0–15.0)
Potassium: 4.3 mmol/L (ref 3.5–5.1)
Sodium: 133 mmol/L — ABNORMAL LOW (ref 135–145)
TCO2: 24 mmol/L (ref 22–32)

## 2022-02-03 LAB — LIPASE, BLOOD: Lipase: 24 U/L (ref 11–51)

## 2022-02-03 LAB — CBC
HCT: 36 % (ref 36.0–46.0)
Hemoglobin: 11.5 g/dL — ABNORMAL LOW (ref 12.0–15.0)
MCH: 31.2 pg (ref 26.0–34.0)
MCHC: 31.9 g/dL (ref 30.0–36.0)
MCV: 97.6 fL (ref 80.0–100.0)
Platelets: 410 10*3/uL — ABNORMAL HIGH (ref 150–400)
RBC: 3.69 MIL/uL — ABNORMAL LOW (ref 3.87–5.11)
RDW: 13.9 % (ref 11.5–15.5)
WBC: 12 10*3/uL — ABNORMAL HIGH (ref 4.0–10.5)
nRBC: 0 % (ref 0.0–0.2)

## 2022-02-03 LAB — MRSA NEXT GEN BY PCR, NASAL: MRSA by PCR Next Gen: NOT DETECTED

## 2022-02-03 LAB — CREATININE, SERUM
Creatinine, Ser: 0.83 mg/dL (ref 0.44–1.00)
GFR, Estimated: >60 mL/min (ref >60)

## 2022-02-03 LAB — LACTIC ACID, PLASMA: Lactic Acid, Venous: 1.1 mmol/L (ref 0.5–1.9)

## 2022-02-03 LAB — CBG MONITORING, ED: Glucose-Capillary: 129 mg/dL — ABNORMAL HIGH (ref 70–99)

## 2022-02-03 LAB — BRAIN NATRIURETIC PEPTIDE: B Natriuretic Peptide: 430.9 pg/mL — ABNORMAL HIGH (ref 0.0–100.0)

## 2022-02-03 LAB — TROPONIN I (HIGH SENSITIVITY)
Troponin I (High Sensitivity): 47 ng/L — ABNORMAL HIGH (ref <18)
Troponin I (High Sensitivity): 40 ng/L — ABNORMAL HIGH (ref <18)

## 2022-02-03 LAB — SARS CORONAVIRUS 2 BY RT PCR: SARS Coronavirus 2 by RT PCR: NEGATIVE

## 2022-02-03 MED ORDER — INSULIN ASPART 100 UNIT/ML IJ SOLN
0.0000 [IU] | INTRAMUSCULAR | Status: DC
  Administered 2022-02-04 – 2022-02-10 (×8): 1 [IU] via SUBCUTANEOUS
  Administered 2022-02-10 (×2): 2 [IU] via SUBCUTANEOUS
  Administered 2022-02-11: 1 [IU] via SUBCUTANEOUS
  Administered 2022-02-11 (×2): 2 [IU] via SUBCUTANEOUS
  Administered 2022-02-11: 1 [IU] via SUBCUTANEOUS
  Administered 2022-02-11: 2 [IU] via SUBCUTANEOUS
  Administered 2022-02-11 (×2): 1 [IU] via SUBCUTANEOUS
  Administered 2022-02-12: 2 [IU] via SUBCUTANEOUS
  Administered 2022-02-12 (×2): 1 [IU] via SUBCUTANEOUS
  Administered 2022-02-12: 2 [IU] via SUBCUTANEOUS
  Administered 2022-02-12: 1 [IU] via SUBCUTANEOUS
  Administered 2022-02-13: 2 [IU] via SUBCUTANEOUS
  Administered 2022-02-13: 1 [IU] via SUBCUTANEOUS
  Filled 2022-02-03: qty 0.09

## 2022-02-03 MED ORDER — ENOXAPARIN SODIUM 40 MG/0.4ML IJ SOSY
40.0000 mg | PREFILLED_SYRINGE | INTRAMUSCULAR | Status: DC
  Administered 2022-02-03 – 2022-02-11 (×9): 40 mg via SUBCUTANEOUS
  Filled 2022-02-03 (×9): qty 0.4

## 2022-02-03 MED ORDER — IOHEXOL 350 MG/ML SOLN
100.0000 mL | Freq: Once | INTRAVENOUS | Status: AC | PRN
  Administered 2022-02-03: 100 mL via INTRAVENOUS

## 2022-02-03 MED ORDER — SODIUM CHLORIDE 0.9 % IV SOLN
2.0000 g | Freq: Once | INTRAVENOUS | Status: AC
  Administered 2022-02-03: 2 g via INTRAVENOUS
  Filled 2022-02-03: qty 12.5

## 2022-02-03 MED ORDER — PROCHLORPERAZINE EDISYLATE 10 MG/2ML IJ SOLN
5.0000 mg | Freq: Four times a day (QID) | INTRAMUSCULAR | Status: DC | PRN
  Administered 2022-02-04 – 2022-02-12 (×9): 5 mg via INTRAVENOUS
  Filled 2022-02-03 (×9): qty 2

## 2022-02-03 MED ORDER — HYDROMORPHONE HCL 1 MG/ML IJ SOLN
0.5000 mg | INTRAMUSCULAR | Status: DC | PRN
  Administered 2022-02-03 – 2022-02-04 (×2): 0.5 mg via INTRAVENOUS
  Filled 2022-02-03 (×2): qty 1

## 2022-02-03 MED ORDER — MORPHINE SULFATE (PF) 4 MG/ML IV SOLN
4.0000 mg | Freq: Once | INTRAVENOUS | Status: AC
  Administered 2022-02-03: 4 mg via INTRAVENOUS
  Filled 2022-02-03: qty 1

## 2022-02-03 MED ORDER — SODIUM CHLORIDE 0.9 % IV SOLN: INTRAVENOUS | Status: AC

## 2022-02-03 MED ORDER — VANCOMYCIN HCL IN DEXTROSE 1-5 GM/200ML-% IV SOLN
1000.0000 mg | Freq: Once | INTRAVENOUS | Status: AC
  Administered 2022-02-03: 1000 mg via INTRAVENOUS
  Filled 2022-02-03: qty 200

## 2022-02-03 MED ORDER — PANTOPRAZOLE SODIUM 40 MG IV SOLR
40.0000 mg | Freq: Once | INTRAVENOUS | Status: AC
  Administered 2022-02-03: 40 mg via INTRAVENOUS
  Filled 2022-02-03: qty 10

## 2022-02-03 MED ORDER — SODIUM CHLORIDE 0.9 % IV SOLN: 2.0000 g | Freq: Two times a day (BID) | INTRAVENOUS | Status: DC

## 2022-02-03 MED ORDER — SODIUM CHLORIDE 0.9 % IV SOLN
2.0000 g | Freq: Two times a day (BID) | INTRAVENOUS | Status: AC
  Administered 2022-02-04 – 2022-02-09 (×12): 2 g via INTRAVENOUS
  Filled 2022-02-03 (×13): qty 12.5

## 2022-02-03 MED ORDER — ONDANSETRON HCL 4 MG/2ML IJ SOLN
4.0000 mg | Freq: Once | INTRAMUSCULAR | Status: AC
  Administered 2022-02-03: 4 mg via INTRAVENOUS
  Filled 2022-02-03: qty 2

## 2022-02-03 MED ORDER — METOPROLOL TARTRATE 5 MG/5ML IV SOLN
5.0000 mg | Freq: Four times a day (QID) | INTRAVENOUS | Status: DC | PRN
  Administered 2022-02-05 – 2022-02-06 (×2): 5 mg via INTRAVENOUS
  Filled 2022-02-03 (×2): qty 5

## 2022-02-03 MED ORDER — IPRATROPIUM-ALBUTEROL 0.5-2.5 (3) MG/3ML IN SOLN
3.0000 mL | Freq: Four times a day (QID) | RESPIRATORY_TRACT | Status: DC
  Administered 2022-02-04: 3 mL via RESPIRATORY_TRACT
  Filled 2022-02-03 (×2): qty 3

## 2022-02-03 MED ORDER — GUAIFENESIN 100 MG/5ML PO LIQD: 5.0000 mL | ORAL | Status: DC | PRN

## 2022-02-03 NOTE — H&P (Signed)
History and Physical  [name] XBW:[id number] DOB: 1933-01-07 DOA: 02/09/2022  Referring physician: Dr. Darl Householder, [place]  PCP: Berniece Salines, DO  Outpatient Specialists: Cardiology Patient coming from: SNF.Marland Kitchen  Chief Complaint: Shortness of breath and abdominal pain.  HPI: [name] is a 88 y.o. female with medical history significant for paroxysmal atrial flutter/atrial fibrillation not on oral anticoagulation, essential hypertension, recent back surgery, who presented to [facility] ED with complaints of shortness of breath and persistent severe abdominal pain diffusely.  Associated with nausea, vomiting, constipation x 3 days.  She went to [facility] ED at that time and was diagnosed with, pneumonia/pleural effusion, prescribed Augmentin and azithromycin, then discharged back to SNF.  Due to her persistent symptomatology, she presented to [facility] ED with the same complaints.  Endorses associative pleuritic pain, worse with taking a deep breath.  She had a chest x-ray done this morning which showed worsening pneumonia, so came to the ED for further evaluation.  In the ED, work-up revealed no pulmonary embolism seen on CT Angio chest, however, revealed pneumonia, CT abdomen pelvis with contrast revealed partial small bowel obstruction.  Admits to flatulence.  No bowel movement since Monday, 4 days ago.  The patient declined NG tube.  EDP consulted general surgery.  The patient was admitted by [facility], hospitalist service.  ED Course: Tmax 99.9.  BP 145/74, pulse 98, respiratory 18, O2 saturation 94% on room air.  Lab studies remarkable for BNP 438.  Serum sodium 133, glucose 145, BUN 35, creatinine 0.83.  Review of Systems: Review of systems as noted in the HPI. All other systems reviewed and are negative.   Past Medical History:  Diagnosis Date   Anemia 10-05-12   hgb-9.0 on 09-14-12   Arm vein blood clot 10-05-12   Rt. arm '12   Arthritis 10-05-12   degenerative joint disease,scoliosis spine   Aspiration  pneumonia ([place])    Dysrhythmia 10-05-12   tachycardia -tx. Lopressor   GERD (gastroesophageal reflux disease)    H/O esophagitis    H/O hiatal hernia    Tachycardia    Transfusion history 10-05-12   4 units blood '12   Past Surgical History:  Procedure Laterality Date   ABDOMINAL HYSTERECTOMY     APPENDECTOMY     child   BACK SURGERY  10-05-12   x5-last fusion with plates   BREAST ENHANCEMENT SURGERY     CATARACT EXTRACTION, BILATERAL     COLONOSCOPY WITH PROPOFOL N/A 10/23/2012   Procedure: COLONOSCOPY WITH PROPOFOL;  Surgeon: Lear Ng, MD;  Location: [facility] ENDOSCOPY;  Service: Endoscopy;  Laterality: N/A;   HOT HEMOSTASIS N/A 10/23/2012   Procedure: HOT HEMOSTASIS (ARGON PLASMA COAGULATION/BICAP);  Surgeon: Lear Ng, MD;  Location: [facility];  Service: Endoscopy;  Laterality: N/A;    Social History:  reports that she has never smoked. She has never used smokeless tobacco. She reports that she does not drink alcohol and does not use drugs.   Allergies  Allergen Reactions   Ciprofloxacin Rash and Other (See Comments)    Burning    Family History  Problem Relation Age of Onset   Cancer Mother    Cancer Father    Diabetes Sister    Stroke Neg Hx    CAD Neg Hx       Prior to Admission medications   Medication Sig Start Date End Date Taking? Authorizing Provider  acetaminophen (TYLENOL) 500 MG tablet Take 1,000 mg by mouth every 8 (eight) hours as needed for  moderate pain or mild pain.   Yes [provider]  amoxicillin-clavulanate (AUGMENTIN) 875-125 MG tablet Take 1 tablet by mouth every 12 (twelve) hours for 5 days. Patient taking differently: Take 1 tablet by mouth every 12 (twelve) hours. For 10 days for pneumonia 01/31/22 02/05/22 Yes Maylon Peppers, [place] K, DO  aspirin 81 MG tablet Take 81 mg by mouth daily.   Yes [provider]  azithromycin (ZITHROMAX) 250 MG tablet Take 1 tablet (250 mg total) by mouth daily for 4 days. Take  first 2 tablets together, then 1 every day until finished. 01/31/22 02/04/22 Yes Kingsley, Jordan Hawks K, DO  carvedilol (COREG) 6.25 MG tablet Take 1 tablet (6.25 mg total) by mouth 2 (two) times daily. 11/22/21  Yes Tobb, Kardie, DO  chlorhexidine (PERIDEX) 0.12 % solution Use as directed 15 mLs in the mouth or throat 2 (two) times daily. 09/27/21  Yes [provider]  Cholecalciferol (VITAMIN D-3) 125 MCG (5000 UT) TABS Take 1 tablet by mouth daily.   Yes [provider]  famotidine (PEPCID) 20 MG tablet Take 20 mg by mouth at bedtime. 09/22/21  Yes [provider]  ferrous sulfate 325 (65 FE) MG tablet Take 325 mg by mouth daily with breakfast.   Yes [provider]  furosemide (LASIX) 20 MG tablet Take 20 mg by mouth every other day. 09/17/21  Yes [provider]  ipratropium-albuterol (DUONEB) 0.5-2.5 (3) MG/3ML SOLN Take 3 mLs by nebulization every 6 (six) hours as needed. Patient taking differently: Take 3 mLs by nebulization in the morning, at noon, and at bedtime. 10/30/21  Yes Amin, Ankit Chirag, MD  LACTOBACILLUS PO Take 1 capsule by mouth daily.   Yes [provider]  melatonin 3 MG TABS tablet Take 3 mg by mouth at bedtime.   Yes [provider]  metoCLOPramide (REGLAN) 10 MG tablet Take 1 tablet (10 mg total) by mouth every 6 (six) hours. 01/31/22  Yes Maylon Peppers, Jordan Hawks K, DO  morphine (MS CONTIN) 100 MG 12 hr tablet Take 1 tablet (100 mg total) by mouth every 12 (twelve) hours. 10/28/21  Yes [name], MD  Nutritional Supplements (NUTRITIONAL DRINK PO) Take 237 mLs by mouth in the morning, at noon, and at bedtime. House Protein Shake   Yes [provider]  ondansetron (ZOFRAN) 8 MG tablet Take 8 mg by mouth every 8 (eight) hours as needed for nausea or vomiting.   Yes [provider]  OXYGEN Inhale 2-3 L/min into the lungs as needed (For O2 level below 92%). Via nasal cannula   Yes [provider]   pantoprazole (PROTONIX) 40 MG tablet Take 40 mg by mouth daily.   Yes [provider]  predniSONE (DELTASONE) 20 MG tablet Take 40 mg by mouth daily. For 7 days for pneumonia 02/01/22  Yes [provider]  sertraline (ZOLOFT) 25 MG tablet Take 25 mg by mouth daily. 10/17/21  Yes [provider]  SPIRIVA HANDIHALER 18 MCG inhalation capsule Place 18 mcg into inhaler and inhale daily. 11/08/21  Yes [provider]  sucralfate (CARAFATE) 1 g tablet Take 1 g by mouth 3 (three) times daily before meals. 10/10/21  Yes [provider]  Zinc Oxide 10 % OINT Apply 1 Application topically in the morning and at bedtime.   Yes [provider]  loperamide (IMODIUM) 2 MG capsule Take 2 mg by mouth every 6 (six) hours as needed for diarrhea or loose stools.    [provider]  Physical Exam: BP (!) 118/49   Pulse (!) 101   Temp 98.8 F (37.1 C) (Oral)   Resp (!) 22   Ht '5\' 3"'$  (1.6 m)   Wt 53.5 kg   SpO2 95%   BMI 20.90 kg/m   General: 88 y.o. year-old female well developed well nourished in no acute distress.  Alert and interactive. Cardiovascular: Regular rate and rhythm with no rubs or gallops.  No thyromegaly or JVD noted.  No lower extremity edema. 2/4 pulses in all 4 extremities. Respiratory: Clear to auscultation with no wheezes or rales. Good inspiratory effort. Abdomen: Abdomen is distended, tender diffusely.  Hypoactive bowel sounds.   Muskuloskeletal: No cyanosis, clubbing or edema noted bilaterally Neuro: CN II-XII intact, strength, sensation, reflexes Skin: No ulcerative lesions noted or rashes Psychiatry: Judgement and insight appear normal. Mood is appropriate for condition and setting          Labs on Admission:  Basic Metabolic Panel: Recent Labs  Lab 01/31/22 0809 01/27/2022 1634 01/27/2022 1635  NA 139 133* 133*  K 3.8 4.3 4.3  CL 103 100 99  CO2 25 23  --   GLUCOSE 149* 149* 145*  BUN 20 36* 35*  CREATININE  0.82 1.00 0.90  CALCIUM 9.8 9.4  --    Liver Function Tests: Recent Labs  Lab 01/31/22 0809 01/21/2022 1634  AST 19 19  ALT 9 10  ALKPHOS 57 53  BILITOT 0.3 0.9  PROT 7.4 7.4  ALBUMIN 3.8 3.3*   Recent Labs  Lab 01/31/22 0809 01/23/2022 1635  LIPASE 25 24   No results for input(s): "AMMONIA" in the last 168 hours. CBC: Recent Labs  Lab 01/31/22 0809 01/21/2022 1634 02/07/2022 1635  WBC 19.1* 12.4*  --   NEUTROABS  --  11.6*  --   HGB 13.3 12.2 13.6  HCT 42.0 38.4 40.0  MCV 100.0 98.5  --   PLT 552* 406*  --    Cardiac Enzymes: No results for input(s): "CKTOTAL", "CKMB", "CKMBINDEX", "TROPONINI" in the last 168 hours.  BNP (last 3 results) Recent Labs    10/24/21 1716 01/28/2022 1635  BNP 524.3* 430.9*    ProBNP (last 3 results) No results for input(s): "PROBNP" in the last 8760 hours.  CBG: Recent Labs  Lab 01/31/2022 1702  GLUCAP 129*    Radiological Exams on Admission: CT Angio Chest PE W and/or Wo Contrast  Result Date: 02/09/2022 CLINICAL DATA:  Pulmonary embolism (PE) suspected, high prob; Abdominal pain, acute, nonlocalized. Pt reports she was diagnosed with pneumonia and given abx Monday and had a repeat xray yesterday that showed worsening pneumonia. Per EMS crackles heard in all lung areas. Pt c/o substernal pain on inspiration. EXAM: CT ANGIOGRAPHY CHEST CT ABDOMEN AND PELVIS WITH CONTRAST TECHNIQUE: Multidetector CT imaging of the chest was performed using the standard protocol during bolus administration of intravenous contrast. Multiplanar CT image reconstructions and MIPs were obtained to evaluate the vascular anatomy. Multidetector CT imaging of the abdomen and pelvis was performed using the standard protocol during bolus administration of intravenous contrast. RADIATION DOSE REDUCTION: This exam was performed according to the departmental dose-optimization program which includes automated exposure control, adjustment of the mA and/or kV according to  patient size and/or use of iterative reconstruction technique. CONTRAST:  166m OMNIPAQUE IOHEXOL 350 MG/ML SOLN COMPARISON:  CT abdomen pelvis 01/31/2022 FINDINGS: CTA CHEST FINDINGS Cardiovascular: Satisfactory opacification of the pulmonary arteries to the segmental level. No evidence of pulmonary embolism. Normal heart size. No significant  pericardial effusion. The thoracic aorta is normal in caliber. Mild-to-moderate atherosclerotic plaque of the thoracic aorta. At least 3 vessel coronary artery calcifications. Mediastinum/Nodes: No enlarged mediastinal, hilar, or axillary lymph nodes. Thyroid gland, trachea, and esophagus demonstrate no significant findings. Large hiatal hernia. Lungs/Pleura: Right lower lobe passive atelectasis. Persistent left lower lobe tree-in-bud nodularity. No focal consolidation. No pulmonary nodule. No pulmonary mass. Trace right pleural effusion. Trace left pleural effusion. No pneumothorax. Musculoskeletal: Bilateral breast implants. No suspicious lytic or blastic osseous lesions. No acute displaced fracture. Multilevel degenerative changes of the spine. Chronic T10 anterior wedge compression fracture. Bilateral shoulder degenerative changes. Review of the MIP images confirms the above findings. CT ABDOMEN and PELVIS FINDINGS Hepatobiliary: No focal liver abnormality. Question layering density within the gallbladder lumen suggestive of fat carious excretion of previously administered intravenous contrast. No gallstones, gallbladder wall thickening, or pericholecystic fluid. No biliary dilatation. Pancreas: No focal lesion. Normal pancreatic contour. No surrounding inflammatory changes. No main pancreatic ductal dilatation. Spleen: Normal in size without focal abnormality. Adrenals/Urinary Tract: No adrenal nodule bilaterally. Bilateral kidneys enhance symmetrically. Subcentimeter hypodensities are too small to characterize. No hydronephrosis. No hydroureter. The urinary bladder is  unremarkable. On delayed imaging, there is no urothelial wall thickening and there are no filling defects in the opacified portions of the bilateral collecting systems or ureters. Stomach/Bowel: Stomach is within normal limits. Multiple loops of small bowel demonstrate air-fluid levels on are dilated up to 3 cm. Question developing transition point within the mid abdomen (2:50, 5:48). No evidence of large bowel wall thickening or dilatation. Colonic diverticulosis. Appendix appears normal. Vascular/Lymphatic: No abdominal aorta or iliac aneurysm. Moderate atherosclerotic plaque of the aorta and its branches. No abdominal, pelvic, or inguinal lymphadenopathy. Reproductive: Status post hysterectomy. No adnexal masses. Other: No intraperitoneal free fluid. No intraperitoneal free gas. No organized fluid collection. Musculoskeletal: L1 - S1 posterolateral and interbody surgical hardware fusion. No suspicious lytic or blastic osseous lesions. No acute displaced fracture. Multilevel degenerative changes of the spine. Review of the MIP images confirms the above findings. IMPRESSION: 1. No pulmonary embolus. 2. Persistent left lower lobe tree-in-bud nodularity suggestive of infection/inflammation. 3. Trace bilateral pleural effusions. 4. Large hiatal hernia. 5. Developing small bowel obstruction with query transition point within the mid abdomen. Small bowel measures up to 3 cm with air-fluid levels. 6. Colonic diverticulosis with no acute diverticulitis. 7.  Aortic Atherosclerosis (ICD10-I70.0). Electronically Signed   By: Iven Finn M.D.   On: 02/10/2022 19:30   CT ABDOMEN PELVIS W CONTRAST  Result Date: 01/17/2022 CLINICAL DATA:  Pulmonary embolism (PE) suspected, high prob; Abdominal pain, acute, nonlocalized. Pt reports she was diagnosed with pneumonia and given abx Monday and had a repeat xray yesterday that showed worsening pneumonia. Per EMS crackles heard in all lung areas. Pt c/o substernal pain on  inspiration. EXAM: CT ANGIOGRAPHY CHEST CT ABDOMEN AND PELVIS WITH CONTRAST TECHNIQUE: Multidetector CT imaging of the chest was performed using the standard protocol during bolus administration of intravenous contrast. Multiplanar CT image reconstructions and MIPs were obtained to evaluate the vascular anatomy. Multidetector CT imaging of the abdomen and pelvis was performed using the standard protocol during bolus administration of intravenous contrast. RADIATION DOSE REDUCTION: This exam was performed according to the departmental dose-optimization program which includes automated exposure control, adjustment of the mA and/or kV according to patient size and/or use of iterative reconstruction technique. CONTRAST:  110m OMNIPAQUE IOHEXOL 350 MG/ML SOLN COMPARISON:  CT abdomen pelvis 01/31/2022 FINDINGS: CTA CHEST FINDINGS Cardiovascular:  Satisfactory opacification of the pulmonary arteries to the segmental level. No evidence of pulmonary embolism. Normal heart size. No significant pericardial effusion. The thoracic aorta is normal in caliber. Mild-to-moderate atherosclerotic plaque of the thoracic aorta. At least 3 vessel coronary artery calcifications. Mediastinum/Nodes: No enlarged mediastinal, hilar, or axillary lymph nodes. Thyroid gland, trachea, and esophagus demonstrate no significant findings. Large hiatal hernia. Lungs/Pleura: Right lower lobe passive atelectasis. Persistent left lower lobe tree-in-bud nodularity. No focal consolidation. No pulmonary nodule. No pulmonary mass. Trace right pleural effusion. Trace left pleural effusion. No pneumothorax. Musculoskeletal: Bilateral breast implants. No suspicious lytic or blastic osseous lesions. No acute displaced fracture. Multilevel degenerative changes of the spine. Chronic T10 anterior wedge compression fracture. Bilateral shoulder degenerative changes. Review of the MIP images confirms the above findings. CT ABDOMEN and PELVIS FINDINGS Hepatobiliary: No  focal liver abnormality. Question layering density within the gallbladder lumen suggestive of fat carious excretion of previously administered intravenous contrast. No gallstones, gallbladder wall thickening, or pericholecystic fluid. No biliary dilatation. Pancreas: No focal lesion. Normal pancreatic contour. No surrounding inflammatory changes. No main pancreatic ductal dilatation. Spleen: Normal in size without focal abnormality. Adrenals/Urinary Tract: No adrenal nodule bilaterally. Bilateral kidneys enhance symmetrically. Subcentimeter hypodensities are too small to characterize. No hydronephrosis. No hydroureter. The urinary bladder is unremarkable. On delayed imaging, there is no urothelial wall thickening and there are no filling defects in the opacified portions of the bilateral collecting systems or ureters. Stomach/Bowel: Stomach is within normal limits. Multiple loops of small bowel demonstrate air-fluid levels on are dilated up to 3 cm. Question developing transition point within the mid abdomen (2:50, 5:48). No evidence of large bowel wall thickening or dilatation. Colonic diverticulosis. Appendix appears normal. Vascular/Lymphatic: No abdominal aorta or iliac aneurysm. Moderate atherosclerotic plaque of the aorta and its branches. No abdominal, pelvic, or inguinal lymphadenopathy. Reproductive: Status post hysterectomy. No adnexal masses. Other: No intraperitoneal free fluid. No intraperitoneal free gas. No organized fluid collection. Musculoskeletal: L1 - S1 posterolateral and interbody surgical hardware fusion. No suspicious lytic or blastic osseous lesions. No acute displaced fracture. Multilevel degenerative changes of the spine. Review of the MIP images confirms the above findings. IMPRESSION: 1. No pulmonary embolus. 2. Persistent left lower lobe tree-in-bud nodularity suggestive of infection/inflammation. 3. Trace bilateral pleural effusions. 4. Large hiatal hernia. 5. Developing small bowel  obstruction with query transition point within the mid abdomen. Small bowel measures up to 3 cm with air-fluid levels. 6. Colonic diverticulosis with no acute diverticulitis. 7.  Aortic Atherosclerosis (ICD10-I70.0). Electronically Signed   By: Iven Finn M.D.   On: 02/09/2022 19:30   DG Chest 2 View  Result Date: 01/21/2022 CLINICAL DATA:  Cough. Patient reports being diagnosed with pneumonia and given antibiotics Monday. Substernal chest pain with inspiration. EXAM: CHEST - 2 VIEW COMPARISON:  Radiographs 01/31/2022 and 10/28/2021. Chest CT 10/24/2021. Abdominal CT 01/31/2022. FINDINGS: Persistent low lung volumes with chronic elevation of the right hemidiaphragm. The heart size and mediastinal contours are stable. There is a large hiatal hernia. There are increased patchy airspace opacities at both lung bases with a possible small right pleural effusion. No evidence of pneumothorax. Postsurgical changes are present within the lumbar spine, incompletely visualized. Telemetry leads overlie the chest. IMPRESSION: 1. Increased patchy bibasilar airspace opacities with possible small right pleural effusion, suspicious for pneumonia. 2. Chronic elevation of the right hemidiaphragm and large hiatal hernia. Electronically Signed   By: Richardean Sale M.D.   On: 01/28/2022 14:49    EKG: I  independently viewed the EKG done and my findings are as followed: Atrial fibrillation, rate of 127.  Nonspecific ST-T changes.  QTc 478.  Assessment/Plan Present on Admission:  Pneumonia  Principal Problem:   Pneumonia  Community-acquired pneumonia, failed outpatient therapy, POA. Diagnosed 3 days ago with community-acquired pneumonia discharged to SNF with Augmentin and azithromycin. Presents with the same symptoms- Imaging shows PNA. In the ED, started on cefepime and IV vancomycin. IV vancomycin DC'd with negative MRSA screening test. Bronchodilators, DuoNebs Incentive spirometer Antitussives as  needed Maintain O2 saturation greater than 90%.  Partial small bowel obstruction, POA CT abdomen and pelvis with contrast showed developing small bowel obstruction with chronic transition point within the mid abdomen.  Small bowel measuring up to 3 cm with air-fluid levels. History of appendectomy at the age of 26.  Also history of hysterectomy. Judicious IV fluid hydration NS at 50 cc/h x 1 day due to history of CHF  Hypovolemic hyponatremia Serum sodium 133 Despite BNP being greater than 400, hypovolemic on exam. Judicious IV fluid hydration NS at 50 cc/h x 1 day  Recent back surgery, wheelchair-bound Fall precautions.  Supportive care.  Chronic diastolic CHF Last 2D echo done on 10/25/2021 showed LVEF 55 to 60% and grade 1 diastolic dysfunction. Closely monitor volume status while on gentle IV fluid hydration, NS at 50 cc/h x 1 day.  Paroxysmal atrial flutter/fibrillation Rate is currently controlled 98. Not on oral anticoagulation prior to admission. Monitor closely on telemetry.   Critical care time: 65 minutes.    DVT prophylaxis: Subcu Lovenox daily  Code Status: Full code  Family Communication: None at bedside  Disposition Plan: Admitted to progressive care unit.  Consults called: None.  Admission status: Inpatient status.   Status is: Inpatient Patient requires at least 2 midnights for further evaluation and treatment of present condition.   Kayleen Memos MD [facility] Pager [phone]  If 7PM-7AM, please contact night-coverage [email or web address] Password [email or web address]  01/28/2022, 9:08 PM

## 2022-02-03 NOTE — H&P (Incomplete)
History and Physical  [name] JQB:[id number] DOB: 03/03/1933 DOA: 02/14/2022  Referring physician: Dr. Darl Householder, [place]  PCP: Berniece Salines, DO  Outpatient Specialists: Cardiology Patient coming from: SNF.Marland Kitchen  Chief Complaint: Shortness of breath and abdominal pain.  HPI: [name] is a 88 y.o. female with medical history significant for paroxysmal atrial flutter/atrial fibrillation not on oral anticoagulation, essential hypertension, recent back surgery, who presented to [facility] ED with complaints of shortness of breath and persistent severe abdominal pain diffusely.  Associated with nausea, vomiting, constipation x 3 days.  She went to [facility] ED at that time and was diagnosed with, pneumonia/pleural effusion, prescribed Augmentin and azithromycin, then discharged back to SNF.  Due to her persistent symptomatology, she presented to [facility] ED with the same complaints.  Endorses associative pleuritic pain, worse with taking a deep breath.  She had a chest x-ray done this morning which showed worsening pneumonia, so came to the ED for further evaluation.  In the ED, work-up revealed no pulmonary embolism seen on CT Angio chest, however, revealed pneumonia, CT abdomen pelvis with contrast revealed partial small bowel obstruction.  Admits to flatulence.  Constipation since Monday, 4 days ago.  The patient declined NG tube.  EDP consulted general surgery.  The patient was admitted by [facility], hospitalist service.  ED Course: Tmax 99.9.  BP 145/74, pulse 98, respiratory 18, O2 saturation 94% on room air.  Lab studies remarkable for BNP 438.  Serum sodium 133, glucose 145, BUN 35, creatinine 0.83.  Review of Systems: Review of systems as noted in the HPI. All other systems reviewed and are negative.   Past Medical History:  Diagnosis Date  . Anemia 10-05-12   hgb-9.0 on 09-14-12  . Arm vein blood clot 10-05-12   Rt. arm '12  . Arthritis 10-05-12   degenerative joint disease,scoliosis spine  . Aspiration  pneumonia ([place])   . Dysrhythmia 10-05-12   tachycardia -tx. Lopressor  . GERD (gastroesophageal reflux disease)   . H/O esophagitis   . H/O hiatal hernia   . Tachycardia   . Transfusion history 10-05-12   4 units blood '12   Past Surgical History:  Procedure Laterality Date  . ABDOMINAL HYSTERECTOMY    . APPENDECTOMY     child  . BACK SURGERY  10-05-12   x5-last fusion with plates  . BREAST ENHANCEMENT SURGERY    . CATARACT EXTRACTION, BILATERAL    . COLONOSCOPY WITH PROPOFOL N/A 10/23/2012   Procedure: COLONOSCOPY WITH PROPOFOL;  Surgeon: Lear Ng, MD;  Location: [facility] ENDOSCOPY;  Service: Endoscopy;  Laterality: N/A;  . HOT HEMOSTASIS N/A 10/23/2012   Procedure: HOT HEMOSTASIS (ARGON PLASMA COAGULATION/BICAP);  Surgeon: Lear Ng, MD;  Location: [facility];  Service: Endoscopy;  Laterality: N/A;    Social History:  reports that she has never smoked. She has never used smokeless tobacco. She reports that she does not drink alcohol and does not use drugs.   Allergies  Allergen Reactions  . Ciprofloxacin Rash and Other (See Comments)    Burning    Family History  Problem Relation Age of Onset  . Cancer Mother   . Cancer Father   . Diabetes Sister   . Stroke Neg Hx   . CAD Neg Hx       Prior to Admission medications   Medication Sig Start Date End Date Taking? Authorizing Provider  acetaminophen (TYLENOL) 500 MG tablet Take 1,000 mg by mouth every 8 (eight) hours as needed for moderate pain  or mild pain.   Yes [provider]  amoxicillin-clavulanate (AUGMENTIN) 875-125 MG tablet Take 1 tablet by mouth every 12 (twelve) hours for 5 days. Patient taking differently: Take 1 tablet by mouth every 12 (twelve) hours. For 10 days for pneumonia 01/31/22 02/05/22 Yes Maylon Peppers, [place] K, DO  aspirin 81 MG tablet Take 81 mg by mouth daily.   Yes [provider]  azithromycin (ZITHROMAX) 250 MG tablet Take 1 tablet (250 mg total) by mouth daily  for 4 days. Take first 2 tablets together, then 1 every day until finished. 01/31/22 02/04/22 Yes Kingsley, Jordan Hawks K, DO  carvedilol (COREG) 6.25 MG tablet Take 1 tablet (6.25 mg total) by mouth 2 (two) times daily. 11/22/21  Yes Tobb, Kardie, DO  chlorhexidine (PERIDEX) 0.12 % solution Use as directed 15 mLs in the mouth or throat 2 (two) times daily. 09/27/21  Yes [provider]  Cholecalciferol (VITAMIN D-3) 125 MCG (5000 UT) TABS Take 1 tablet by mouth daily.   Yes [provider]  famotidine (PEPCID) 20 MG tablet Take 20 mg by mouth at bedtime. 09/22/21  Yes [provider]  ferrous sulfate 325 (65 FE) MG tablet Take 325 mg by mouth daily with breakfast.   Yes [provider]  furosemide (LASIX) 20 MG tablet Take 20 mg by mouth every other day. 09/17/21  Yes [provider]  ipratropium-albuterol (DUONEB) 0.5-2.5 (3) MG/3ML SOLN Take 3 mLs by nebulization every 6 (six) hours as needed. Patient taking differently: Take 3 mLs by nebulization in the morning, at noon, and at bedtime. 10/30/21  Yes Amin, Ankit Chirag, MD  LACTOBACILLUS PO Take 1 capsule by mouth daily.   Yes [provider]  melatonin 3 MG TABS tablet Take 3 mg by mouth at bedtime.   Yes [provider]  metoCLOPramide (REGLAN) 10 MG tablet Take 1 tablet (10 mg total) by mouth every 6 (six) hours. 01/31/22  Yes Maylon Peppers, Jordan Hawks K, DO  morphine (MS CONTIN) 100 MG 12 hr tablet Take 1 tablet (100 mg total) by mouth every 12 (twelve) hours. 10/28/21  Yes [name], MD  Nutritional Supplements (NUTRITIONAL DRINK PO) Take 237 mLs by mouth in the morning, at noon, and at bedtime. House Protein Shake   Yes [provider]  ondansetron (ZOFRAN) 8 MG tablet Take 8 mg by mouth every 8 (eight) hours as needed for nausea or vomiting.   Yes [provider]  OXYGEN Inhale 2-3 L/min into the lungs as needed (For O2 level below 92%). Via nasal cannula   Yes [provider]  pantoprazole (PROTONIX) 40 MG tablet Take 40 mg by mouth daily.   Yes [provider]  predniSONE (DELTASONE) 20 MG tablet Take 40 mg by mouth daily. For 7 days for pneumonia 02/01/22  Yes [provider]  sertraline (ZOLOFT) 25 MG tablet Take 25 mg by mouth daily. 10/17/21  Yes [provider]  SPIRIVA HANDIHALER 18 MCG inhalation capsule Place 18 mcg into inhaler and inhale daily. 11/08/21  Yes [provider]  sucralfate (CARAFATE) 1 g tablet Take 1 g by mouth 3 (three) times daily before meals. 10/10/21  Yes [provider]  Zinc Oxide 10 % OINT Apply 1 Application topically in the morning and at bedtime.   Yes [provider]  loperamide (IMODIUM) 2 MG capsule Take 2 mg by mouth every 6 (six) hours as needed for diarrhea or loose stools.    [provider]  Physical Exam: BP (!) 118/49   Pulse (!) 101   Temp 98.8 F (37.1 C) (Oral)   Resp (!) 22   Ht '5\' 3"'$  (1.6 m)   Wt 53.5 kg   SpO2 95%   BMI 20.90 kg/m   General: 88 y.o. year-old female well developed well nourished in no acute distress.  Alert and oriented x3. Cardiovascular: Regular rate and rhythm with no rubs or gallops.  No thyromegaly or JVD noted.  No lower extremity edema. 2/4 pulses in all 4 extremities. Respiratory: Clear to auscultation with no wheezes or rales. Good inspiratory effort. Abdomen: She is distended, distended.  Hypoactive bowel sounds.   Muskuloskeletal: No cyanosis, clubbing or edema noted bilaterally Neuro: CN II-XII intact, strength, sensation, reflexes Skin: No ulcerative lesions noted or rashes Psychiatry: Judgement and insight appear normal. Mood is appropriate for condition and setting          Labs on Admission:  Basic Metabolic Panel: Recent Labs  Lab 01/31/22 0809 01/20/2022 1634 02/15/2022 1635  NA 139 133* 133*  K 3.8 4.3 4.3  CL 103 100 99  CO2 25 23  --   GLUCOSE 149* 149* 145*  BUN 20 36* 35*   CREATININE 0.82 1.00 0.90  CALCIUM 9.8 9.4  --    Liver Function Tests: Recent Labs  Lab 01/31/22 0809 01/22/2022 1634  AST 19 19  ALT 9 10  ALKPHOS 57 53  BILITOT 0.3 0.9  PROT 7.4 7.4  ALBUMIN 3.8 3.3*   Recent Labs  Lab 01/31/22 0809 02/08/2022 1635  LIPASE 25 24   No results for input(s): "AMMONIA" in the last 168 hours. CBC: Recent Labs  Lab 01/31/22 0809 01/19/2022 1634 02/07/2022 1635  WBC 19.1* 12.4*  --   NEUTROABS  --  11.6*  --   HGB 13.3 12.2 13.6  HCT 42.0 38.4 40.0  MCV 100.0 98.5  --   PLT 552* 406*  --    Cardiac Enzymes: No results for input(s): "CKTOTAL", "CKMB", "CKMBINDEX", "TROPONINI" in the last 168 hours.  BNP (last 3 results) Recent Labs    10/24/21 1716 01/26/2022 1635  BNP 524.3* 430.9*    ProBNP (last 3 results) No results for input(s): "PROBNP" in the last 8760 hours.  CBG: Recent Labs  Lab 01/17/2022 1702  GLUCAP 129*    Radiological Exams on Admission: CT Angio Chest PE W and/or Wo Contrast  Result Date: 02/11/2022 CLINICAL DATA:  Pulmonary embolism (PE) suspected, high prob; Abdominal pain, acute, nonlocalized. Pt reports she was diagnosed with pneumonia and given abx Monday and had a repeat xray yesterday that showed worsening pneumonia. Per EMS crackles heard in all lung areas. Pt c/o substernal pain on inspiration. EXAM: CT ANGIOGRAPHY CHEST CT ABDOMEN AND PELVIS WITH CONTRAST TECHNIQUE: Multidetector CT imaging of the chest was performed using the standard protocol during bolus administration of intravenous contrast. Multiplanar CT image reconstructions and MIPs were obtained to evaluate the vascular anatomy. Multidetector CT imaging of the abdomen and pelvis was performed using the standard protocol during bolus administration of intravenous contrast. RADIATION DOSE REDUCTION: This exam was performed according to the departmental dose-optimization program which includes automated exposure control, adjustment of the mA and/or kV  according to patient size and/or use of iterative reconstruction technique. CONTRAST:  173m OMNIPAQUE IOHEXOL 350 MG/ML SOLN COMPARISON:  CT abdomen pelvis 01/31/2022 FINDINGS: CTA CHEST FINDINGS Cardiovascular: Satisfactory opacification of the pulmonary arteries to the segmental level. No evidence of pulmonary embolism. Normal heart size. No significant  pericardial effusion. The thoracic aorta is normal in caliber. Mild-to-moderate atherosclerotic plaque of the thoracic aorta. At least 3 vessel coronary artery calcifications. Mediastinum/Nodes: No enlarged mediastinal, hilar, or axillary lymph nodes. Thyroid gland, trachea, and esophagus demonstrate no significant findings. Large hiatal hernia. Lungs/Pleura: Right lower lobe passive atelectasis. Persistent left lower lobe tree-in-bud nodularity. No focal consolidation. No pulmonary nodule. No pulmonary mass. Trace right pleural effusion. Trace left pleural effusion. No pneumothorax. Musculoskeletal: Bilateral breast implants. No suspicious lytic or blastic osseous lesions. No acute displaced fracture. Multilevel degenerative changes of the spine. Chronic T10 anterior wedge compression fracture. Bilateral shoulder degenerative changes. Review of the MIP images confirms the above findings. CT ABDOMEN and PELVIS FINDINGS Hepatobiliary: No focal liver abnormality. Question layering density within the gallbladder lumen suggestive of fat carious excretion of previously administered intravenous contrast. No gallstones, gallbladder wall thickening, or pericholecystic fluid. No biliary dilatation. Pancreas: No focal lesion. Normal pancreatic contour. No surrounding inflammatory changes. No main pancreatic ductal dilatation. Spleen: Normal in size without focal abnormality. Adrenals/Urinary Tract: No adrenal nodule bilaterally. Bilateral kidneys enhance symmetrically. Subcentimeter hypodensities are too small to characterize. No hydronephrosis. No hydroureter. The urinary  bladder is unremarkable. On delayed imaging, there is no urothelial wall thickening and there are no filling defects in the opacified portions of the bilateral collecting systems or ureters. Stomach/Bowel: Stomach is within normal limits. Multiple loops of small bowel demonstrate air-fluid levels on are dilated up to 3 cm. Question developing transition point within the mid abdomen (2:50, 5:48). No evidence of large bowel wall thickening or dilatation. Colonic diverticulosis. Appendix appears normal. Vascular/Lymphatic: No abdominal aorta or iliac aneurysm. Moderate atherosclerotic plaque of the aorta and its branches. No abdominal, pelvic, or inguinal lymphadenopathy. Reproductive: Status post hysterectomy. No adnexal masses. Other: No intraperitoneal free fluid. No intraperitoneal free gas. No organized fluid collection. Musculoskeletal: L1 - S1 posterolateral and interbody surgical hardware fusion. No suspicious lytic or blastic osseous lesions. No acute displaced fracture. Multilevel degenerative changes of the spine. Review of the MIP images confirms the above findings. IMPRESSION: 1. No pulmonary embolus. 2. Persistent left lower lobe tree-in-bud nodularity suggestive of infection/inflammation. 3. Trace bilateral pleural effusions. 4. Large hiatal hernia. 5. Developing small bowel obstruction with query transition point within the mid abdomen. Small bowel measures up to 3 cm with air-fluid levels. 6. Colonic diverticulosis with no acute diverticulitis. 7.  Aortic Atherosclerosis (ICD10-I70.0). Electronically Signed   By: Iven Finn M.D.   On: 02/05/2022 19:30   CT ABDOMEN PELVIS W CONTRAST  Result Date: 02/04/2022 CLINICAL DATA:  Pulmonary embolism (PE) suspected, high prob; Abdominal pain, acute, nonlocalized. Pt reports she was diagnosed with pneumonia and given abx Monday and had a repeat xray yesterday that showed worsening pneumonia. Per EMS crackles heard in all lung areas. Pt c/o substernal  pain on inspiration. EXAM: CT ANGIOGRAPHY CHEST CT ABDOMEN AND PELVIS WITH CONTRAST TECHNIQUE: Multidetector CT imaging of the chest was performed using the standard protocol during bolus administration of intravenous contrast. Multiplanar CT image reconstructions and MIPs were obtained to evaluate the vascular anatomy. Multidetector CT imaging of the abdomen and pelvis was performed using the standard protocol during bolus administration of intravenous contrast. RADIATION DOSE REDUCTION: This exam was performed according to the departmental dose-optimization program which includes automated exposure control, adjustment of the mA and/or kV according to patient size and/or use of iterative reconstruction technique. CONTRAST:  158m OMNIPAQUE IOHEXOL 350 MG/ML SOLN COMPARISON:  CT abdomen pelvis 01/31/2022 FINDINGS: CTA CHEST FINDINGS Cardiovascular:  Satisfactory opacification of the pulmonary arteries to the segmental level. No evidence of pulmonary embolism. Normal heart size. No significant pericardial effusion. The thoracic aorta is normal in caliber. Mild-to-moderate atherosclerotic plaque of the thoracic aorta. At least 3 vessel coronary artery calcifications. Mediastinum/Nodes: No enlarged mediastinal, hilar, or axillary lymph nodes. Thyroid gland, trachea, and esophagus demonstrate no significant findings. Large hiatal hernia. Lungs/Pleura: Right lower lobe passive atelectasis. Persistent left lower lobe tree-in-bud nodularity. No focal consolidation. No pulmonary nodule. No pulmonary mass. Trace right pleural effusion. Trace left pleural effusion. No pneumothorax. Musculoskeletal: Bilateral breast implants. No suspicious lytic or blastic osseous lesions. No acute displaced fracture. Multilevel degenerative changes of the spine. Chronic T10 anterior wedge compression fracture. Bilateral shoulder degenerative changes. Review of the MIP images confirms the above findings. CT ABDOMEN and PELVIS FINDINGS  Hepatobiliary: No focal liver abnormality. Question layering density within the gallbladder lumen suggestive of fat carious excretion of previously administered intravenous contrast. No gallstones, gallbladder wall thickening, or pericholecystic fluid. No biliary dilatation. Pancreas: No focal lesion. Normal pancreatic contour. No surrounding inflammatory changes. No main pancreatic ductal dilatation. Spleen: Normal in size without focal abnormality. Adrenals/Urinary Tract: No adrenal nodule bilaterally. Bilateral kidneys enhance symmetrically. Subcentimeter hypodensities are too small to characterize. No hydronephrosis. No hydroureter. The urinary bladder is unremarkable. On delayed imaging, there is no urothelial wall thickening and there are no filling defects in the opacified portions of the bilateral collecting systems or ureters. Stomach/Bowel: Stomach is within normal limits. Multiple loops of small bowel demonstrate air-fluid levels on are dilated up to 3 cm. Question developing transition point within the mid abdomen (2:50, 5:48). No evidence of large bowel wall thickening or dilatation. Colonic diverticulosis. Appendix appears normal. Vascular/Lymphatic: No abdominal aorta or iliac aneurysm. Moderate atherosclerotic plaque of the aorta and its branches. No abdominal, pelvic, or inguinal lymphadenopathy. Reproductive: Status post hysterectomy. No adnexal masses. Other: No intraperitoneal free fluid. No intraperitoneal free gas. No organized fluid collection. Musculoskeletal: L1 - S1 posterolateral and interbody surgical hardware fusion. No suspicious lytic or blastic osseous lesions. No acute displaced fracture. Multilevel degenerative changes of the spine. Review of the MIP images confirms the above findings. IMPRESSION: 1. No pulmonary embolus. 2. Persistent left lower lobe tree-in-bud nodularity suggestive of infection/inflammation. 3. Trace bilateral pleural effusions. 4. Large hiatal hernia. 5.  Developing small bowel obstruction with query transition point within the mid abdomen. Small bowel measures up to 3 cm with air-fluid levels. 6. Colonic diverticulosis with no acute diverticulitis. 7.  Aortic Atherosclerosis (ICD10-I70.0). Electronically Signed   By: Iven Finn M.D.   On: 02/11/2022 19:30   DG Chest 2 View  Result Date: 01/22/2022 CLINICAL DATA:  Cough. Patient reports being diagnosed with pneumonia and given antibiotics Monday. Substernal chest pain with inspiration. EXAM: CHEST - 2 VIEW COMPARISON:  Radiographs 01/31/2022 and 10/28/2021. Chest CT 10/24/2021. Abdominal CT 01/31/2022. FINDINGS: Persistent low lung volumes with chronic elevation of the right hemidiaphragm. The heart size and mediastinal contours are stable. There is a large hiatal hernia. There are increased patchy airspace opacities at both lung bases with a possible small right pleural effusion. No evidence of pneumothorax. Postsurgical changes are present within the lumbar spine, incompletely visualized. Telemetry leads overlie the chest. IMPRESSION: 1. Increased patchy bibasilar airspace opacities with possible small right pleural effusion, suspicious for pneumonia. 2. Chronic elevation of the right hemidiaphragm and large hiatal hernia. Electronically Signed   By: Richardean Sale M.D.   On: 02/05/2022 14:49    EKG: I  independently viewed the EKG done and my findings are as followed: Atrial fibrillation, rate of 127.  Nonspecific ST-T changes.  QTc 478.  Assessment/Plan Present on Admission: . Pneumonia  Principal Problem:   Pneumonia  Community-acquired pneumonia, failed outpatient therapy, POA. Diagnosed 3 days ago with community-acquired pneumonia discharged to SNF with Augmentin and azithromycin. In the ED started on cefepime and IV vancomycin. IV vancomycin DC'd with negative MRSA screening test. Bronchodilators, DuoNebs Incentive spirometer Antitussives as needed Maintain O2 saturation greater  than 90%.  Partial small bowel obstruction, POA CT abdomen and pelvis with contrast showed developing small bowel obstruction with chronic transition point within the mid abdomen.  Small bowel measuring up to 3 cm with air-fluid levels. History of appendectomy at the age of 48.  Also history of hysterectomy. Judicious IV fluid hydration NS at 50 cc/h x 1 day due to history of CHF  Hypovolemic hyponatremia Serum sodium 133 Despite BMP being greater than 400, hypovolemic on exam. Judicious IV fluid hydration NS at 50 cc/h x 1 day  Recent back surgery, wheelchair-bound Fall precautions.  Supportive care.  Chronic diastolic CHF Last 2D echo done on 10/25/2021 showed LVEF 55 to 60% and grade 1 diastolic dysfunction. Closely monitor volume status while on gentle IV fluid hydration, NS at 50 cc/h x 1 day.  Paroxysmal atrial flutter/fibrillation Rate is currently controlled 98. Not on oral anticoagulation prior to admission. Monitor closely on telemetry.    DVT prophylaxis: Subcu Lovenox daily  Code Status: Full code  Family Communication: None at bedside  Disposition Plan: Admitted to progressive care unit.  Consults called: None.  Admission status: Inpatient status.   Status is: Inpatient Patient requires at least 2 midnights for further evaluation and treatment of present condition.   Kayleen Memos MD [facility] Pager [phone]  If 7PM-7AM, please contact night-coverage [email or web address] Password [email or web address]  01/24/2022, 9:08 PM

## 2022-02-03 NOTE — ED Triage Notes (Signed)
Pt coming from [facility] via EMS. Pt reports she was diagnosed with pneumonia and given abx Monday and had a repeat xray yesterday that showed worsening pneumonia. Per EMS crackles heard in all lung areas. Pt c/o substernal pain on inspiration. Pt states that she does not feel any worse than she did Monday.

## 2022-02-03 NOTE — ED Provider Notes (Signed)
[facility] DEPT Provider Note   CSN: [id number] Arrival date & time: 02/02/2022  1358     History  Chief Complaint  Patient presents with   Pneumonia    CRYSTALYNN MCINERNEY is a 88 y.o. female history of a flutter but not on anticoagulation, chronic back pain, here presenting with shortness of breath and abdominal pain.  Patient states that she went to the hospital was diagnosed with pneumonia 3 days ago.  Patient was discharged home with Augmentin and azithromycin.  Patient states that she has persistent shortness of breath and abdominal pain.  She had a CTA abdomen pelvis that time that just showed hiatal hernia.  Patient had a chest x-ray this morning that showed worsening pneumonia and she has been has persistent cough and chills so came here for further evaluation.   The history is provided by the patient.       Home Medications Prior to Admission medications   Medication Sig Start Date End Date Taking? Authorizing Provider  acetaminophen (TYLENOL) 500 MG tablet Take 1,000 mg by mouth every 8 (eight) hours as needed for moderate pain or mild pain.   Yes [provider]  amoxicillin-clavulanate (AUGMENTIN) 875-125 MG tablet Take 1 tablet by mouth every 12 (twelve) hours for 5 days. Patient taking differently: Take 1 tablet by mouth every 12 (twelve) hours. For 10 days for pneumonia 01/31/22 02/05/22 Yes Maylon Peppers, [place] K, DO  aspirin 81 MG tablet Take 81 mg by mouth daily.   Yes [provider]  azithromycin (ZITHROMAX) 250 MG tablet Take 1 tablet (250 mg total) by mouth daily for 4 days. Take first 2 tablets together, then 1 every day until finished. 01/31/22 02/04/22 Yes Kingsley, Jordan Hawks K, DO  carvedilol (COREG) 6.25 MG tablet Take 1 tablet (6.25 mg total) by mouth 2 (two) times daily. 11/22/21  Yes Tobb, Kardie, DO  chlorhexidine (PERIDEX) 0.12 % solution Use as directed 15 mLs in the mouth or throat 2 (two) times daily. 09/27/21   Yes [provider]  Cholecalciferol (VITAMIN D-3) 125 MCG (5000 UT) TABS Take 1 tablet by mouth daily.   Yes [provider]  famotidine (PEPCID) 20 MG tablet Take 20 mg by mouth at bedtime. 09/22/21  Yes [provider]  ferrous sulfate 325 (65 FE) MG tablet Take 325 mg by mouth daily with breakfast.   Yes [provider]  furosemide (LASIX) 20 MG tablet Take 20 mg by mouth every other day. 09/17/21  Yes [provider]  ipratropium-albuterol (DUONEB) 0.5-2.5 (3) MG/3ML SOLN Take 3 mLs by nebulization every 6 (six) hours as needed. Patient taking differently: Take 3 mLs by nebulization in the morning, at noon, and at bedtime. 10/30/21  Yes Amin, Ankit Chirag, MD  LACTOBACILLUS PO Take 1 capsule by mouth daily.   Yes [provider]  melatonin 3 MG TABS tablet Take 3 mg by mouth at bedtime.   Yes [provider]  metoCLOPramide (REGLAN) 10 MG tablet Take 1 tablet (10 mg total) by mouth every 6 (six) hours. 01/31/22  Yes Maylon Peppers, Jordan Hawks K, DO  morphine (MS CONTIN) 100 MG 12 hr tablet Take 1 tablet (100 mg total) by mouth every 12 (twelve) hours. 10/28/21  Yes [name], MD  Nutritional Supplements (NUTRITIONAL DRINK PO) Take 237 mLs by mouth in the morning, at noon, and at bedtime. House Protein Shake   Yes [provider]  ondansetron (ZOFRAN) 8 MG tablet Take 8 mg by mouth every 8 (  eight) hours as needed for nausea or vomiting.   Yes [provider]  OXYGEN Inhale 2-3 L/min into the lungs as needed (For O2 level below 92%). Via nasal cannula   Yes [provider]  pantoprazole (PROTONIX) 40 MG tablet Take 40 mg by mouth daily.   Yes [provider]  predniSONE (DELTASONE) 20 MG tablet Take 40 mg by mouth daily. For 7 days for pneumonia 02/01/22  Yes [provider]  sertraline (ZOLOFT) 25 MG tablet Take 25 mg by mouth daily. 10/17/21  Yes [provider]  SPIRIVA HANDIHALER 18 MCG  inhalation capsule Place 18 mcg into inhaler and inhale daily. 11/08/21  Yes [provider]  sucralfate (CARAFATE) 1 g tablet Take 1 g by mouth 3 (three) times daily before meals. 10/10/21  Yes [provider]  Zinc Oxide 10 % OINT Apply 1 Application topically in the morning and at bedtime.   Yes [provider]  loperamide (IMODIUM) 2 MG capsule Take 2 mg by mouth every 6 (six) hours as needed for diarrhea or loose stools.    [provider]      Allergies    Ciprofloxacin    Review of Systems   Review of Systems  Constitutional:  Positive for fever.  Respiratory:  Positive for shortness of breath.   Gastrointestinal:  Positive for abdominal pain.  All other systems reviewed and are negative.   Physical Exam Updated Vital Signs BP 102/73   Pulse 99   Temp 99.9 F (37.7 C) (Oral)   Resp 19   Ht '5\' 3"'$  (1.6 m)   Wt 53.5 kg   SpO2 94%   BMI 20.90 kg/m  Physical Exam Vitals and nursing note reviewed.  Constitutional:      Comments: Chronically ill, tachypneic  HENT:     Head: Normocephalic.     Nose: Nose normal.     Mouth/Throat:     Mouth: Mucous membranes are dry.  Eyes:     Extraocular Movements: Extraocular movements intact.     Pupils: Pupils are equal, round, and reactive to light.  Cardiovascular:     Rate and Rhythm: Normal rate and regular rhythm.     Pulses: Normal pulses.     Heart sounds: Normal heart sounds.  Pulmonary:     Comments: Crackles bilateral bases worse on the right side. Abdominal:     General: Abdomen is flat.     Palpations: Abdomen is soft.  Musculoskeletal:        General: Normal range of motion.     Cervical back: Normal range of motion and neck supple.  Skin:    General: Skin is warm.     Capillary Refill: Capillary refill takes less than 2 seconds.  Neurological:     General: No focal deficit present.     Mental Status: She is oriented to person, place, and time.  Psychiatric:        Mood and  Affect: Mood normal.        Behavior: Behavior normal.     ED Results / Procedures / Treatments   Labs (all labs ordered are listed, but only abnormal results are displayed) Labs Reviewed  SARS CORONAVIRUS 2 BY RT PCR  CULTURE, BLOOD (ROUTINE X 2)  CULTURE, BLOOD (ROUTINE X 2)  MRSA NEXT GEN BY PCR, NASAL  CBC WITH DIFFERENTIAL/PLATELET  COMPREHENSIVE METABOLIC PANEL  LACTIC ACID, PLASMA  LACTIC ACID, PLASMA  BRAIN NATRIURETIC PEPTIDE  LIPASE, BLOOD  BLOOD GAS,  VENOUS  I-STAT CHEM 8, ED  TROPONIN I (HIGH SENSITIVITY)    EKG None  Radiology DG Chest 2 View  Result Date: 01/24/2022 CLINICAL DATA:  Cough. Patient reports being diagnosed with pneumonia and given antibiotics Monday. Substernal chest pain with inspiration. EXAM: CHEST - 2 VIEW COMPARISON:  Radiographs 01/31/2022 and 10/28/2021. Chest CT 10/24/2021. Abdominal CT 01/31/2022. FINDINGS: Persistent low lung volumes with chronic elevation of the right hemidiaphragm. The heart size and mediastinal contours are stable. There is a large hiatal hernia. There are increased patchy airspace opacities at both lung bases with a possible small right pleural effusion. No evidence of pneumothorax. Postsurgical changes are present within the lumbar spine, incompletely visualized. Telemetry leads overlie the chest. IMPRESSION: 1. Increased patchy bibasilar airspace opacities with possible small right pleural effusion, suspicious for pneumonia. 2. Chronic elevation of the right hemidiaphragm and large hiatal hernia. Electronically Signed   By: Richardean Sale M.D.   On: 02/02/2022 14:49    Procedures Procedures    Medications Ordered in ED Medications  morphine (PF) 4 MG/ML injection 4 mg (has no administration in time range)  vancomycin (VANCOCIN) IVPB 1000 mg/200 mL premix (has no administration in time range)  ceFEPIme (MAXIPIME) 2 g in sodium chloride 0.9 % 100 mL IVPB (has no administration in time range)    ED Course/ Medical  Decision Making/ A&P                           Medical Decision Making CLOVA MORLOCK is a 88 y.o. female here with cough, fever, abdominal pain.  Patient's chest x-ray outpatient showed increased pneumonia.  She is already on antibiotics for the last 3 days.  Plan to get CBC and CMP and lactate and cultures and CTA chest to rule out PE and also CT abdomen pelvis to rule out intra-abdominal process.  I suspect her her abdominal pain is likely from her hiatal hernia.   7:53 PM I reviewed patient's labs and independently interpreted CT scans.  CT showed pneumonia.  Patient also has large hiatal hernia and possible partial small bowel obstruction.  Patient has no vomiting right now.  She does not want an NG tube.  Patient received broad-spectrum antibiotics for pneumonia.  I messaged general surgery group to let them know about the small bowel obstruction.  Hospitalist to admit.  Problems Addressed: Community acquired pneumonia of left lower lobe of lung: acute illness or injury SBO (small bowel obstruction) ([place]): acute illness or injury  Amount and/or Complexity of Data Reviewed Labs: ordered. Decision-making details documented in ED Course. Radiology: ordered and independent interpretation performed. Decision-making details documented in ED Course.  Risk Prescription drug management. Decision regarding hospitalization.    Final Clinical Impression(s) / ED Diagnoses Final diagnoses:  None    Rx / DC Orders ED Discharge Orders     None         Drenda Freeze, MD 02/01/2022 Karl Bales

## 2022-02-03 NOTE — Progress Notes (Addendum)
A consult was received from an ED physician for cefepime per pharmacy dosing.  The patient's profile has been reviewed for ht/wt/allergies/indication/available labs.   A one time order has been placed for cefepime 2 gm IV x 1 dose.    Further antibiotics/pharmacy consults should be ordered by admitting physician if indicated.                       Thank you,  Eudelia Bunch, Pharm.D 01/28/2022 3:40 PM

## 2022-02-04 ENCOUNTER — Inpatient Hospital Stay (HOSPITAL_COMMUNITY): Payer: Medicare (Managed Care)

## 2022-02-04 DIAGNOSIS — K56609 Unspecified intestinal obstruction, unspecified as to partial versus complete obstruction: Secondary | ICD-10-CM

## 2022-02-04 DIAGNOSIS — Z7189 Other specified counseling: Secondary | ICD-10-CM

## 2022-02-04 DIAGNOSIS — J189 Pneumonia, unspecified organism: Secondary | ICD-10-CM | POA: Diagnosis not present

## 2022-02-04 LAB — CBC WITH DIFFERENTIAL/PLATELET
Abs Immature Granulocytes: 0.09 10*3/uL — ABNORMAL HIGH (ref 0.00–0.07)
Basophils Absolute: 0 10*3/uL (ref 0.0–0.1)
Basophils Relative: 0 %
Eosinophils Absolute: 0 10*3/uL (ref 0.0–0.5)
Eosinophils Relative: 0 %
HCT: 37.9 % (ref 36.0–46.0)
Hemoglobin: 12 g/dL (ref 12.0–15.0)
Immature Granulocytes: 1 %
Lymphocytes Relative: 6 %
Lymphs Abs: 0.9 10*3/uL (ref 0.7–4.0)
MCH: 31.2 pg (ref 26.0–34.0)
MCHC: 31.7 g/dL (ref 30.0–36.0)
MCV: 98.4 fL (ref 80.0–100.0)
Monocytes Absolute: 0.8 10*3/uL (ref 0.1–1.0)
Monocytes Relative: 5 %
Neutro Abs: 13.8 10*3/uL — ABNORMAL HIGH (ref 1.7–7.7)
Neutrophils Relative %: 88 %
Platelets: 492 10*3/uL — ABNORMAL HIGH (ref 150–400)
RBC: 3.85 MIL/uL — ABNORMAL LOW (ref 3.87–5.11)
RDW: 14.1 % (ref 11.5–15.5)
WBC: 15.7 10*3/uL — ABNORMAL HIGH (ref 4.0–10.5)
nRBC: 0 % (ref 0.0–0.2)

## 2022-02-04 LAB — CBG MONITORING, ED
Glucose-Capillary: 117 mg/dL — ABNORMAL HIGH (ref 70–99)
Glucose-Capillary: 108 mg/dL — ABNORMAL HIGH (ref 70–99)
Glucose-Capillary: 110 mg/dL — ABNORMAL HIGH (ref 70–99)
Glucose-Capillary: 115 mg/dL — ABNORMAL HIGH (ref 70–99)

## 2022-02-04 LAB — MAGNESIUM: Magnesium: 2.1 mg/dL (ref 1.7–2.4)

## 2022-02-04 LAB — BASIC METABOLIC PANEL
Anion gap: 10 (ref 5–15)
BUN: 32 mg/dL — ABNORMAL HIGH (ref 8–23)
CO2: 23 mmol/L (ref 22–32)
Calcium: 9.2 mg/dL (ref 8.9–10.3)
Chloride: 103 mmol/L (ref 98–111)
Creatinine, Ser: 0.9 mg/dL (ref 0.44–1.00)
GFR, Estimated: >60 mL/min (ref >60)
Glucose, Bld: 119 mg/dL — ABNORMAL HIGH (ref 70–99)
Potassium: 3.9 mmol/L (ref 3.5–5.1)
Sodium: 136 mmol/L (ref 135–145)

## 2022-02-04 LAB — HEMOGLOBIN A1C
Hgb A1c MFr Bld: 5.6 % (ref 4.8–5.6)
Mean Plasma Glucose: 114.02 mg/dL

## 2022-02-04 LAB — GLUCOSE, CAPILLARY
Glucose-Capillary: 116 mg/dL — ABNORMAL HIGH (ref 70–99)
Glucose-Capillary: 133 mg/dL — ABNORMAL HIGH (ref 70–99)

## 2022-02-04 LAB — TROPONIN I (HIGH SENSITIVITY): Troponin I (High Sensitivity): 43 ng/L — ABNORMAL HIGH (ref <18)

## 2022-02-04 LAB — PHOSPHORUS: Phosphorus: 2.7 mg/dL (ref 2.5–4.6)

## 2022-02-04 MED ORDER — CARVEDILOL 6.25 MG PO TABS
6.2500 mg | ORAL_TABLET | Freq: Two times a day (BID) | ORAL | Status: DC
  Filled 2022-02-04: qty 1

## 2022-02-04 MED ORDER — IPRATROPIUM-ALBUTEROL 0.5-2.5 (3) MG/3ML IN SOLN
3.0000 mL | Freq: Three times a day (TID) | RESPIRATORY_TRACT | Status: DC
  Filled 2022-02-04 (×2): qty 3

## 2022-02-04 MED ORDER — DIATRIZOATE MEGLUMINE & SODIUM 66-10 % PO SOLN
90.0000 mL | Freq: Once | ORAL | Status: AC
  Administered 2022-02-05: 90 mL via NASOGASTRIC
  Filled 2022-02-04: qty 90

## 2022-02-04 MED ORDER — MORPHINE SULFATE ER 100 MG PO TBCR: 100.0000 mg | EXTENDED_RELEASE_TABLET | Freq: Two times a day (BID) | ORAL | Status: DC

## 2022-02-04 MED ORDER — HYDROMORPHONE HCL 1 MG/ML IJ SOLN
0.5000 mg | INTRAMUSCULAR | Status: DC | PRN
  Administered 2022-02-04 – 2022-02-07 (×36): 0.5 mg via INTRAVENOUS
  Filled 2022-02-04 (×17): qty 0.5
  Filled 2022-02-04: qty 1
  Filled 2022-02-04 (×7): qty 0.5
  Filled 2022-02-04: qty 1
  Filled 2022-02-04 (×10): qty 0.5

## 2022-02-04 MED ORDER — SERTRALINE HCL 50 MG PO TABS
25.0000 mg | ORAL_TABLET | Freq: Every day | ORAL | Status: DC
  Administered 2022-02-08 – 2022-02-10 (×3): 25 mg via ORAL
  Filled 2022-02-04 (×4): qty 1

## 2022-02-04 MED ORDER — ASPIRIN 81 MG PO TBEC
81.0000 mg | DELAYED_RELEASE_TABLET | Freq: Every day | ORAL | Status: DC
  Administered 2022-02-08 – 2022-02-10 (×3): 81 mg via ORAL
  Filled 2022-02-04 (×4): qty 1

## 2022-02-04 MED ORDER — IPRATROPIUM-ALBUTEROL 0.5-2.5 (3) MG/3ML IN SOLN
3.0000 mL | Freq: Four times a day (QID) | RESPIRATORY_TRACT | Status: DC | PRN
  Administered 2022-02-08 – 2022-02-12 (×2): 3 mL via RESPIRATORY_TRACT
  Filled 2022-02-04 (×2): qty 3

## 2022-02-04 MED ORDER — HYDROMORPHONE HCL 1 MG/ML IJ SOLN
0.5000 mg | INTRAMUSCULAR | Status: DC | PRN
  Administered 2022-02-04 (×3): 0.5 mg via INTRAVENOUS
  Filled 2022-02-04 (×3): qty 1

## 2022-02-04 MED ORDER — TIOTROPIUM BROMIDE MONOHYDRATE 18 MCG IN CAPS: 18.0000 ug | ORAL_CAPSULE | Freq: Every day | RESPIRATORY_TRACT | Status: DC

## 2022-02-04 MED ORDER — PANTOPRAZOLE SODIUM 40 MG IV SOLR
40.0000 mg | INTRAVENOUS | Status: DC
  Administered 2022-02-04 – 2022-02-08 (×5): 40 mg via INTRAVENOUS
  Filled 2022-02-04 (×5): qty 10

## 2022-02-04 MED ORDER — UMECLIDINIUM BROMIDE 62.5 MCG/ACT IN AEPB
1.0000 | INHALATION_SPRAY | Freq: Every day | RESPIRATORY_TRACT | Status: DC
  Administered 2022-02-04 – 2022-02-10 (×6): 1 via RESPIRATORY_TRACT
  Filled 2022-02-04: qty 7

## 2022-02-04 NOTE — Consult Note (Signed)
Consultation Note Date: 02/04/2022   Patient Name: [name]  DOB: 1933/02/01  MRN: [id number]  Age / Sex: 88 y.o., female  PCP: Berniece Salines, DO Referring Physician: Antonieta Pert, MD  Reason for Consultation:  Goals of care  HPI/Patient Profile: 88 y.o. female  with past medical history of a fib, hypertension, GERD, constipation, large hiatal hernia, multiple back surgeries, chronic respiratory failure on 2LO2 at home, osteoarthritis, chronic pain syndrome, resident in [place] at [facility] admitted on 01/28/2022 with abdominal pain and shortness of breath. Workup reveals pneumonia and partial SBO. Palliative consulted for [place].    Primary Decision Maker PATIENT - also has HCPOA- Lorene Dy and alternate Verita Lamb  Discussion: Chart reviewed including labs, imaging, progress notes from this and previous encounters.  Met with patient at bedside. She was alert and oriented. She was in some distress due to pain in her abdomen.  She resides at [facility]. She uses a wheelchair but reports being independent with her care. Able to transfer herself from bed to wheelchair and move about.  She is a retired Music therapist. Her family lives in [place]- she doesn't like [place].  She understands she has pneumonia which she has had before, and a small bowel obstruction. If surgery were offered she would accept.  She would like to be full code, but would not want long term artificial life supporting measures.  Advanced Directives and HCPOA are in place on her chart.     SUMMARY OF RECOMMENDATIONS -Continue full scope care- patient would accept surgery if offered -GOC are for pain control, life continuing measures and to d/c back to SNF    Code Status/Advance Care Planning: Full code   Prognosis:   Unable to determine  Discharge Planning: To Be Determined  Primary Diagnoses: Present on  Admission:  Pneumonia     Physical Exam Vitals and nursing note reviewed.  Constitutional:      General: She is in acute distress.  Pulmonary:     Effort: Pulmonary effort is normal.  Skin:    Coloration: Skin is pale.  Neurological:     Mental Status: She is alert.  Psychiatric:        Mood and Affect: Mood normal.        Behavior: Behavior normal.     Vital Signs: BP 127/70   Pulse 63   Temp 98.2 F (36.8 C) (Oral)   Resp 16   Ht 5' 3"  (1.6 m)   Wt 53.5 kg   SpO2 96%   BMI 20.90 kg/m  Pain Scale: 0-10   Pain Score: 7    SpO2: SpO2: 96 % O2 Device:SpO2: 96 % O2 Flow Rate: .O2 Flow Rate (L/min): 2 L/min  IO: Intake/output summary:  Intake/Output Summary (Last 24 hours) at 02/04/2022 1207 Last data filed at 02/04/2022 0846 Gross per 24 hour  Intake 100 ml  Output --  Net 100 ml    LBM: Last BM Date : 02/04/22 Baseline Weight: Weight: 53.5 kg Most recent weight: Weight:  53.5 kg       Thank you for this consult. Palliative medicine will continue to follow and assist as needed.   Greater than 50%  of this time was spent counseling and coordinating care related to the above assessment and plan.  Signed by: Mariana Kaufman, AGNP-C Palliative Medicine    Please contact Palliative Medicine Team phone at [phone] for questions and concerns.  For individual provider: See Shea Evans

## 2022-02-04 NOTE — ED Notes (Signed)
Pt called out stating she "threw up and pooped". Noted liquid throw up at side of the bed, and pt had large loose BM. EVS mopped the floor. This Probation officer provided pericare and placed pt in a clean brief. Pt states she feels pain "in the middle" of her abdomen and states "it feels tight". RN aware

## 2022-02-04 NOTE — ED Notes (Signed)
Discussed w/ pt the need for NG tube. Pt reports she'd like to wait until she gets a bed upstairs before NG tube placement.

## 2022-02-04 NOTE — Progress Notes (Signed)
RN informed the patient that the doctor wanted a NG tube placed. Patient stated she wanted to give it another day and see if it gets better. RN explained the importance of having the NG tube placed and the patient verbalized understanding. Patient still adamant about waiting until tomorrow to make a decision. Will continue to monitor closely.

## 2022-02-04 NOTE — Progress Notes (Signed)
PROGRESS NOTE [name]  WLN:[id number] DOB: 1932-08-20 DOA: 02/04/2022 PCP: Berniece Salines, DO   Brief Narrative/Hospital Course: 88 year old female with P atrial flutter not on anticoagulation, hypertension recent back surgery, on chronic high-dose morphine presented with shortness of breath persistent diffuse abdominal pain nausea vomiting and constipation x3 days PTA Seen in the [facility] ED 10/16-diagnosed with pneumonia/pleural effusion discharged back to facility on antibiotics. Due to persistent similar pain presented to [facility] ED 10/19- w/ pleuritic pain worse with deep breath chest x-ray worsening pneumonia. CTA negative for PE revealed persistent left lower lobe tree-in-bud nodularity suggestive of infection/inflammation, trace bilateral pleural effusion, large hiatal hernia, neuropathy small bowel obstruction query transition point in the mid abdomen.  Pneumonia, CT abdomen pelvis with contrast revealed partial small bowel obstruction. surgery was consulted and patient was admitted. Labs showed BNP 438.  Serum sodium 133, glucose 145, BUN 35, creatinine 0.83.  Subjective: Seen this am C/l loose stoolx2 this C/o back pain bed is uncomfortable Pain on left upper abdomen/lower chest with deep breath No nausea vomiting She takes morphine twice a day- 100 mg for her back, says she has not walked In few years on WC Currently she is on [place] O2- was o nit before has been off for a month    Assessment and Plan: Principal Problem:   Pneumonia  Community-acquired pneumonia: Recently discharged on Augmentin and azithromycin.  Currently vitals are stable afebrile continue broad-spectrum antibiotics as below continue supplemental oxygen spirometry antitussives bronchodilators as needed.  Follow-up MRSA screen Recent Labs  Lab 01/31/22 0809 01/31/22 1055 01/31/22 1231 01/23/2022 1634 01/31/2022 1636 02/02/2022 2218 02/04/22 0500  WBC 19.1*  --   --  12.4*  --  12.0* 15.7*  LATICACIDVEN  --  2.0*  1.8  --  1.1  --   --    Partial small bowel obstruction: "CT abdomen in the ED, general surgeon consulted, patient denies any nausea or vomiting, reports she had bowel movements x2.  X-ray abdomen this morning diffuse mild to moderate small bowel dilation up to 4.1 cm not appreciably changed from CT.  Await further surgical input, continue IV fluid hydration.  Will need NG decompression  Hypovolemic hyponatremia: Mild, resolved Recent Labs  Lab 01/31/22 0809 02/06/2022 1634 01/23/2022 1635 02/04/22 0500  NA 139 133* 133* 136   Chronic pain/back pain Recent back surgery wheelchair-bound: She is on morphine sulfate- cont IV pain medication hopefully can take p.o. soon.  Complains of significant pain  PAF rate controlled not on anticoagulation.  On Coreg, resume Chronic diastolic CHF currently volume status stable on gentle IV fluid hydration for small bowel obstruction.  Monitor closely.  BNP 430.9, repeat in the morning Elevated troponin 47-40,: ?  Etiology, repeat   Goals of care full code,   DVT prophylaxis: enoxaparin (LOVENOX) injection 40 mg Start: 01/22/2022 2200 Code Status:   Code Status: Full Code Family Communication: plan of care discussed with patient at bedside. Patient status is: Inpatient because of pneumonia and abdominal pain bowel obstruction management Level of care: Progressive   Dispo: The patient is from: Skilled nursing facility            Anticipated disposition: snf  Mobility Assessment (last 72 hours)     Mobility Assessment   No documentation.            Objective: Vitals last 24 hrs: Vitals:   02/04/22 0400 02/04/22 0500 02/04/22 0600 02/04/22 0736  BP: 131/76 138/68 (!) 117/59 (!) 131/119  Pulse: Marland Kitchen)  102 (!) 101 93 (!) 123  Resp: (!) 34 (!) '29 15 20  '$ Temp:  98.5 F (36.9 C)  98.8 F (37.1 C)  TempSrc:  Oral  Oral  SpO2: 95% 93% 97% 96%  Weight:      Height:       Weight change:   Physical Examination: General exam: alert awake,  elderly, frail older than stated age HEENT:Oral mucosa moist, Ear/Nose WNL grossly Respiratory system: bilaterally diminished BS,no use of accessory muscle Cardiovascular system: S1 & S2 +, No JVD. Gastrointestinal system: Abdomen soft,Tender on lower abdomen, BS sluggish Nervous System:Alert, awake, moving extremities. Extremities: LE edema neg, distal peripheral pulses palpable.  Skin: No rashes,no icterus. MSK: Normal muscle bulk,tone, power  Medications reviewed:  Scheduled Meds:  enoxaparin (LOVENOX) injection  40 mg Subcutaneous Q24H   insulin aspart  0-9 Units Subcutaneous Q4H   ipratropium-albuterol  3 mL Nebulization Q6H  Continuous Infusions:  sodium chloride 50 mL/hr at 02/04/22 0141   ceFEPime (MAXIPIME) IV 2 g (02/04/22 0358)    Diet Order     None        No intake or output data in the 24 hours ending 02/04/22 0741 Net IO Since Admission: No IO data has been entered for this period [02/04/22 0741]  Wt Readings from Last 3 Encounters:  02/05/2022 53.5 kg  01/31/22 53.5 kg  11/22/21 52.2 kg     Unresulted Labs (From admission, onward)     Start     Ordered   02/10/22 0500  Creatinine, serum  (enoxaparin (LOVENOX)    CrCl >/= 30 ml/min)  Weekly,   R     Comments: while on enoxaparin therapy    01/19/2022 2108   02/04/22 0500  Hemoglobin A1c  Tomorrow morning,   R       Comments: To assess prior glycemic control    01/26/2022 2340   01/29/2022 1503  Blood culture (routine x 2)  BLOOD CULTURE X 2,   R (with STAT occurrences)      02/08/2022 1502          Data Reviewed: I have personally reviewed following labs and imaging studies CBC: Recent Labs  Lab 01/31/22 0809 02/01/2022 1634 02/11/2022 1635 01/30/2022 2218 02/04/22 0500  WBC 19.1* 12.4*  --  12.0* 15.7*  NEUTROABS  --  11.6*  --   --  13.8*  HGB 13.3 12.2 13.6 11.5* 12.0  HCT 42.0 38.4 40.0 36.0 37.9  MCV 100.0 98.5  --  97.6 98.4  PLT 552* 406*  --  410* 433*   Basic Metabolic Panel: Recent Labs   Lab 01/31/22 0809 01/26/2022 1634 01/20/2022 1635 01/21/2022 2218 02/04/22 0500  NA 139 133* 133*  --  136  K 3.8 4.3 4.3  --  3.9  CL 103 100 99  --  103  CO2 25 23  --   --  23  GLUCOSE 149* 149* 145*  --  119*  BUN 20 36* 35*  --  32*  CREATININE 0.82 1.00 0.90 0.83 0.90  CALCIUM 9.8 9.4  --   --  9.2  MG  --   --   --   --  2.1  PHOS  --   --   --   --  2.7   GFR: Estimated Creatinine Clearance: 36.4 mL/min (by C-G formula based on SCr of 0.9 mg/dL). Liver Function Tests: Recent Labs  Lab 01/31/22 0809 02/07/2022 1634  AST 19 19  ALT 9  10  ALKPHOS 57 53  BILITOT 0.3 0.9  PROT 7.4 7.4  ALBUMIN 3.8 3.3*   Recent Labs  Lab 01/31/22 0809 01/24/2022 1635  LIPASE 25 24   No results for input(s): "AMMONIA" in the last 168 hours. Coagulation Profile: No results for input(s): "INR", "PROTIME" in the last 168 hours. BNP (last 3 results) No results for input(s): "PROBNP" in the last 8760 hours. HbA1C: No results for input(s): "HGBA1C" in the last 72 hours. CBG: Recent Labs  Lab 01/25/2022 1702 02/04/22 0110 02/04/22 0355  GLUCAP 129* 117* 108*   Lipid Profile: No results for input(s): "CHOL", "HDL", "LDLCALC", "TRIG", "CHOLHDL", "LDLDIRECT" in the last 72 hours. Thyroid Function Tests: No results for input(s): "TSH", "T4TOTAL", "FREET4", "T3FREE", "THYROIDAB" in the last 72 hours. Sepsis Labs: Recent Labs  Lab 01/31/22 1055 01/31/22 1231 01/24/2022 1636  LATICACIDVEN 2.0* 1.8 1.1    Recent Results (from the past 240 hour(s))  SARS Coronavirus 2 by RT PCR (hospital order, performed in [facility] hospital lab) *cepheid single result test* Anterior Nasal Swab     Status: None   Collection Time: 02/15/2022  2:25 PM   Specimen: Anterior Nasal Swab  Result Value Ref Range Status   SARS Coronavirus 2 by RT PCR NEGATIVE NEGATIVE Final    Comment: (NOTE) SARS-CoV-2 target nucleic acids are NOT DETECTED.  The SARS-CoV-2 RNA is generally detectable in upper and  lower respiratory specimens during the acute phase of infection. The lowest concentration of SARS-CoV-2 viral copies this assay can detect is 250 copies / mL. A negative result does not preclude SARS-CoV-2 infection and should not be used as the sole basis for treatment or other patient management decisions.  A negative result may occur with improper specimen collection / handling, submission of specimen other than nasopharyngeal swab, presence of viral mutation(s) within the areas targeted by this assay, and inadequate number of viral copies (<250 copies / mL). A negative result must be combined with clinical observations, patient history, and epidemiological information.  Fact Sheet for Patients:   https://www.patel.info/  Fact Sheet for Healthcare Providers: https://hall.com/  This test is not yet approved or  cleared by the [place] FDA and has been authorized for detection and/or diagnosis of SARS-CoV-2 by FDA under an Emergency Use Authorization (EUA).  This EUA will remain in effect (meaning this test can be used) for the duration of the COVID-19 declaration under Section 564(b)(1) of the Act, 21 U.S.C. section 360bbb-3(b)(1), unless the authorization is terminated or revoked sooner.  Performed at [facility], [place] [address], [place], [place] [address]   MRSA Next Gen by PCR, Nasal     Status: None   Collection Time: 01/26/2022  4:37 PM   Specimen: Nasal Swab  Result Value Ref Range Status   MRSA by PCR Next Gen NOT DETECTED NOT DETECTED Final    Comment: (NOTE) The GeneXpert MRSA Assay (FDA approved for NASAL specimens only), is one component of a comprehensive MRSA colonization surveillance program. It is not intended to diagnose MRSA infection nor to guide or monitor treatment for MRSA infections. Test performance is not FDA approved in patients less than 86 years old. Performed at [facility], [place] [address] [place], [place] [address]     Antimicrobials: Anti-infectives (From admission, onward)    Start     Dose/Rate Route Frequency Ordered Stop   02/04/22 0600  ceFEPIme (MAXIPIME) 2 g in sodium chloride 0.9 % 100 mL IVPB  Status:  Discontinued        2 g 200 mL/hr over 30 Minutes Intravenous Every 12 hours 01/27/2022 1534 02/07/2022 1541   02/04/22 0400  ceFEPIme (MAXIPIME) 2 g in sodium chloride 0.9 % 100 mL IVPB        2 g 200 mL/hr over 30 Minutes Intravenous Every 12 hours 02/06/2022 2226     01/28/2022 1545  ceFEPIme (MAXIPIME) 2 g in sodium chloride 0.9 % 100 mL IVPB        2 g 200 mL/hr over 30 Minutes Intravenous  Once 01/26/2022 1528 02/01/2022 1636   02/02/2022 1515  vancomycin (VANCOCIN) IVPB 1000 mg/200 mL premix        1,000 mg 200 mL/hr over 60 Minutes Intravenous  Once 01/22/2022 1510 02/01/2022 2037      Culture/Microbiology    Component Value Date/Time   SDES BLOOD LEFT HAND 04/20/2016 1531   SPECREQUEST BOTTLES DRAWN AEROBIC ONLY 5CC 04/20/2016 1531   CULT  04/20/2016 1531    NO GROWTH 5 DAYS Performed at [facility] 04/25/2016 FINAL 04/20/2016 1531    Other culture-see note  Radiology Studies: CT Angio Chest PE W and/or Wo Contrast  Result Date: 01/27/2022 CLINICAL DATA:  Pulmonary embolism (PE) suspected, high prob; Abdominal pain, acute, nonlocalized. Pt reports she was diagnosed with pneumonia and given abx Monday and had a repeat xray yesterday that showed worsening pneumonia. Per EMS crackles heard in all lung areas. Pt c/o substernal pain on inspiration. EXAM: CT ANGIOGRAPHY CHEST CT ABDOMEN AND PELVIS WITH CONTRAST TECHNIQUE: Multidetector CT imaging of the chest was performed using the standard protocol during bolus administration of intravenous contrast. Multiplanar CT image reconstructions and MIPs were obtained to evaluate the vascular anatomy. Multidetector CT imaging of the abdomen and pelvis was performed using the  standard protocol during bolus administration of intravenous contrast. RADIATION DOSE REDUCTION: This exam was performed according to the departmental dose-optimization program which includes automated exposure control, adjustment of the mA and/or kV according to patient size and/or use of iterative reconstruction technique. CONTRAST:  125m OMNIPAQUE IOHEXOL 350 MG/ML SOLN COMPARISON:  CT abdomen pelvis 01/31/2022 FINDINGS: CTA CHEST FINDINGS Cardiovascular: Satisfactory opacification of the pulmonary arteries to the segmental level. No evidence of pulmonary embolism. Normal heart size. No significant pericardial effusion. The thoracic aorta is normal in caliber. Mild-to-moderate atherosclerotic plaque of the thoracic aorta. At least 3 vessel coronary artery calcifications. Mediastinum/Nodes: No enlarged mediastinal, hilar, or axillary lymph nodes. Thyroid gland, trachea, and esophagus demonstrate no significant findings. Large hiatal hernia. Lungs/Pleura: Right lower lobe passive atelectasis. Persistent left lower lobe tree-in-bud nodularity. No focal consolidation. No pulmonary nodule. No pulmonary mass. Trace right pleural effusion. Trace left pleural effusion. No pneumothorax. Musculoskeletal: Bilateral breast implants. No suspicious lytic or blastic osseous lesions. No acute displaced fracture. Multilevel degenerative changes of the spine. Chronic T10 anterior wedge compression fracture. Bilateral shoulder degenerative changes. Review of the MIP images confirms the above findings. CT ABDOMEN and PELVIS FINDINGS Hepatobiliary: No focal liver abnormality. Question layering density within the gallbladder lumen suggestive of fat carious excretion of previously administered intravenous contrast. No gallstones, gallbladder wall thickening, or pericholecystic fluid. No biliary dilatation. Pancreas: No focal lesion. Normal pancreatic contour. No surrounding inflammatory changes. No main pancreatic ductal dilatation.  Spleen: Normal in size without focal abnormality. Adrenals/Urinary Tract: No adrenal nodule bilaterally. Bilateral kidneys enhance symmetrically. Subcentimeter hypodensities are too small to characterize. No hydronephrosis. No hydroureter. The urinary bladder is unremarkable. On delayed imaging,  there is no urothelial wall thickening and there are no filling defects in the opacified portions of the bilateral collecting systems or ureters. Stomach/Bowel: Stomach is within normal limits. Multiple loops of small bowel demonstrate air-fluid levels on are dilated up to 3 cm. Question developing transition point within the mid abdomen (2:50, 5:48). No evidence of large bowel wall thickening or dilatation. Colonic diverticulosis. Appendix appears normal. Vascular/Lymphatic: No abdominal aorta or iliac aneurysm. Moderate atherosclerotic plaque of the aorta and its branches. No abdominal, pelvic, or inguinal lymphadenopathy. Reproductive: Status post hysterectomy. No adnexal masses. Other: No intraperitoneal free fluid. No intraperitoneal free gas. No organized fluid collection. Musculoskeletal: L1 - S1 posterolateral and interbody surgical hardware fusion. No suspicious lytic or blastic osseous lesions. No acute displaced fracture. Multilevel degenerative changes of the spine. Review of the MIP images confirms the above findings. IMPRESSION: 1. No pulmonary embolus. 2. Persistent left lower lobe tree-in-bud nodularity suggestive of infection/inflammation. 3. Trace bilateral pleural effusions. 4. Large hiatal hernia. 5. Developing small bowel obstruction with query transition point within the mid abdomen. Small bowel measures up to 3 cm with air-fluid levels. 6. Colonic diverticulosis with no acute diverticulitis. 7.  Aortic Atherosclerosis (ICD10-I70.0). Electronically Signed   By: Iven Finn M.D.   On: 01/24/2022 19:30   CT ABDOMEN PELVIS W CONTRAST  Result Date: 01/23/2022 CLINICAL DATA:  Pulmonary embolism (PE)  suspected, high prob; Abdominal pain, acute, nonlocalized. Pt reports she was diagnosed with pneumonia and given abx Monday and had a repeat xray yesterday that showed worsening pneumonia. Per EMS crackles heard in all lung areas. Pt c/o substernal pain on inspiration. EXAM: CT ANGIOGRAPHY CHEST CT ABDOMEN AND PELVIS WITH CONTRAST TECHNIQUE: Multidetector CT imaging of the chest was performed using the standard protocol during bolus administration of intravenous contrast. Multiplanar CT image reconstructions and MIPs were obtained to evaluate the vascular anatomy. Multidetector CT imaging of the abdomen and pelvis was performed using the standard protocol during bolus administration of intravenous contrast. RADIATION DOSE REDUCTION: This exam was performed according to the departmental dose-optimization program which includes automated exposure control, adjustment of the mA and/or kV according to patient size and/or use of iterative reconstruction technique. CONTRAST:  11m OMNIPAQUE IOHEXOL 350 MG/ML SOLN COMPARISON:  CT abdomen pelvis 01/31/2022 FINDINGS: CTA CHEST FINDINGS Cardiovascular: Satisfactory opacification of the pulmonary arteries to the segmental level. No evidence of pulmonary embolism. Normal heart size. No significant pericardial effusion. The thoracic aorta is normal in caliber. Mild-to-moderate atherosclerotic plaque of the thoracic aorta. At least 3 vessel coronary artery calcifications. Mediastinum/Nodes: No enlarged mediastinal, hilar, or axillary lymph nodes. Thyroid gland, trachea, and esophagus demonstrate no significant findings. Large hiatal hernia. Lungs/Pleura: Right lower lobe passive atelectasis. Persistent left lower lobe tree-in-bud nodularity. No focal consolidation. No pulmonary nodule. No pulmonary mass. Trace right pleural effusion. Trace left pleural effusion. No pneumothorax. Musculoskeletal: Bilateral breast implants. No suspicious lytic or blastic osseous lesions. No acute  displaced fracture. Multilevel degenerative changes of the spine. Chronic T10 anterior wedge compression fracture. Bilateral shoulder degenerative changes. Review of the MIP images confirms the above findings. CT ABDOMEN and PELVIS FINDINGS Hepatobiliary: No focal liver abnormality. Question layering density within the gallbladder lumen suggestive of fat carious excretion of previously administered intravenous contrast. No gallstones, gallbladder wall thickening, or pericholecystic fluid. No biliary dilatation. Pancreas: No focal lesion. Normal pancreatic contour. No surrounding inflammatory changes. No main pancreatic ductal dilatation. Spleen: Normal in size without focal abnormality. Adrenals/Urinary Tract: No adrenal nodule bilaterally. Bilateral kidneys enhance  symmetrically. Subcentimeter hypodensities are too small to characterize. No hydronephrosis. No hydroureter. The urinary bladder is unremarkable. On delayed imaging, there is no urothelial wall thickening and there are no filling defects in the opacified portions of the bilateral collecting systems or ureters. Stomach/Bowel: Stomach is within normal limits. Multiple loops of small bowel demonstrate air-fluid levels on are dilated up to 3 cm. Question developing transition point within the mid abdomen (2:50, 5:48). No evidence of large bowel wall thickening or dilatation. Colonic diverticulosis. Appendix appears normal. Vascular/Lymphatic: No abdominal aorta or iliac aneurysm. Moderate atherosclerotic plaque of the aorta and its branches. No abdominal, pelvic, or inguinal lymphadenopathy. Reproductive: Status post hysterectomy. No adnexal masses. Other: No intraperitoneal free fluid. No intraperitoneal free gas. No organized fluid collection. Musculoskeletal: L1 - S1 posterolateral and interbody surgical hardware fusion. No suspicious lytic or blastic osseous lesions. No acute displaced fracture. Multilevel degenerative changes of the spine. Review of the  MIP images confirms the above findings. IMPRESSION: 1. No pulmonary embolus. 2. Persistent left lower lobe tree-in-bud nodularity suggestive of infection/inflammation. 3. Trace bilateral pleural effusions. 4. Large hiatal hernia. 5. Developing small bowel obstruction with query transition point within the mid abdomen. Small bowel measures up to 3 cm with air-fluid levels. 6. Colonic diverticulosis with no acute diverticulitis. 7.  Aortic Atherosclerosis (ICD10-I70.0). Electronically Signed   By: Iven Finn M.D.   On: 01/31/2022 19:30   DG Chest 2 View  Result Date: 02/12/2022 CLINICAL DATA:  Cough. Patient reports being diagnosed with pneumonia and given antibiotics Monday. Substernal chest pain with inspiration. EXAM: CHEST - 2 VIEW COMPARISON:  Radiographs 01/31/2022 and 10/28/2021. Chest CT 10/24/2021. Abdominal CT 01/31/2022. FINDINGS: Persistent low lung volumes with chronic elevation of the right hemidiaphragm. The heart size and mediastinal contours are stable. There is a large hiatal hernia. There are increased patchy airspace opacities at both lung bases with a possible small right pleural effusion. No evidence of pneumothorax. Postsurgical changes are present within the lumbar spine, incompletely visualized. Telemetry leads overlie the chest. IMPRESSION: 1. Increased patchy bibasilar airspace opacities with possible small right pleural effusion, suspicious for pneumonia. 2. Chronic elevation of the right hemidiaphragm and large hiatal hernia. Electronically Signed   By: Richardean Sale M.D.   On: 01/27/2022 14:49     LOS: 1 day   Antonieta Pert, MD [facility]  02/04/2022, 7:41 AM

## 2022-02-04 NOTE — Consult Note (Signed)
[name] [birth year]  [id number].    Requesting MD: Dr. Lance Sell Chief Complaint/Reason for Consult: SBO  HPI: [name] is a 88 y.o. female with a history of PAF not on anticoagulation and hypertension coming from SNF who presented to the ED for abdominal pain, nausea, vomiting, and shortness of breath.  Patient reports over the last 3 days she has been having generalized abdominal pain with persistent nausea and intermittent vomiting.  Reports last bowel movement was 3 days ago.  She is passing flatus.  Was recently seen in the ED on 10/16 and diagnosed with LLL pneumonia, prescribed Augmentin and azithromycin.  Work-up in the ED revealed pneumonia.  She was admitted to [facility] and started on antibiotics.  Her CT A/P also showed dilated small bowel up to 3 cm with query of transition point in the mid abdomen she has seen possible sbo/psbo.  Patient has history of prior open appendectomy, and abdominal hysterectomy.  I do not see history of prior SBO's and she denies prior history of SBO's.  She reports her last colonoscopy was greater than 10 years ago normal -the last I can see in the system was in 2014 with multiple polyps removed and path with tubular adenomas without high-grade dysplasia.   Tmax 99.9.  Tachycardic to the 120s.  Currently 101.  No hypotension.  WBC 15.7.  Creatinine within normal limits.  Lactic within normal limits.  She reports continued abdominal distention, generalized abdominal pain and nausea.  Thinks she had 2 small, liquidy BMs since admission and is passing flatus.  X-rays this morning however show continued small bowel dilation up to 4.1 cm.   ROS: ROS As above, see hpi  Family History  Problem Relation Age of Onset   Cancer Mother    Cancer Father    Diabetes Sister    Stroke Neg Hx    CAD Neg Hx     Past Medical History:  Diagnosis Date   Anemia 10-05-12   hgb-9.0 on 09-14-12   Arm vein blood clot 10-05-12   Rt. arm '12   Arthritis 10-05-12    degenerative joint disease,scoliosis spine   Aspiration pneumonia ([place])    Dysrhythmia 10-05-12   tachycardia -tx. Lopressor   GERD (gastroesophageal reflux disease)    H/O esophagitis    H/O hiatal hernia    Tachycardia    Transfusion history 10-05-12   4 units blood '12    Past Surgical History:  Procedure Laterality Date   ABDOMINAL HYSTERECTOMY     APPENDECTOMY     child   BACK SURGERY  10-05-12   x5-last fusion with plates   BREAST ENHANCEMENT SURGERY     CATARACT EXTRACTION, BILATERAL     COLONOSCOPY WITH PROPOFOL N/A 10/23/2012   Procedure: COLONOSCOPY WITH PROPOFOL;  Surgeon: Lear Ng, MD;  Location: [facility];  Service: Endoscopy;  Laterality: N/A;   HOT HEMOSTASIS N/A 10/23/2012   Procedure: HOT HEMOSTASIS (ARGON PLASMA COAGULATION/BICAP);  Surgeon: Lear Ng, MD;  Location: [facility];  Service: Endoscopy;  Laterality: N/A;    Social History:  reports that she has never smoked. She has never used smokeless tobacco. She reports that she does not drink alcohol and does not use drugs.  Allergies:  Allergies  Allergen Reactions   Ciprofloxacin Rash and Other (See Comments)    Burning    (Not in a hospital admission)    Physical Exam: Blood pressure (!) 142/81, pulse (!) 101, temperature 98.8  F (37.1 C), temperature source Oral, resp. rate 15, height '5\' 3"'$  (1.6 m), weight 53.5 kg, SpO2 93 %. General: Pleasant, frail elderly female who is laying in bed in NAD HEENT: head is normocephalic, atraumatic.  Sclera are noninjected.  PERRL.  Ears and nose without any masses or lesions.  Mouth is pink and moist. Dentition fair Heart: Tachycardic with regular rhythm Lungs: Distant breath sounds at the bases bilaterally.  Respiratory effort nonlabored Abd: Mild to moderate distention but soft.  Generalized abdominal tenderness without rigidity or guarding.  Hypoactive bowel sounds.  No obvious hernias.  Prior Pfannenstiel incision well-healed.   MS:  MAE's. No BUE edema Skin: warm and dry with no masses, lesions, or rashes Psych: A&Ox3 with an appropriate affect Neuro: cranial nerves grossly intact, normal speech, thought process intact, moves all extremities, gait not assessed   Results for orders placed or performed during the hospital encounter of 01/21/2022 (from the past 48 hour(s))  SARS Coronavirus 2 by RT PCR (hospital order, performed in [facility] hospital lab) *cepheid single result test* Anterior Nasal Swab     Status: None   Collection Time: 02/02/2022  2:25 PM   Specimen: Anterior Nasal Swab  Result Value Ref Range   SARS Coronavirus 2 by RT PCR NEGATIVE NEGATIVE    Comment: (NOTE) SARS-CoV-2 target nucleic acids are NOT DETECTED.  The SARS-CoV-2 RNA is generally detectable in upper and lower respiratory specimens during the acute phase of infection. The lowest concentration of SARS-CoV-2 viral copies this assay can detect is 250 copies / mL. A negative result does not preclude SARS-CoV-2 infection and should not be used as the sole basis for treatment or other patient management decisions.  A negative result may occur with improper specimen collection / handling, submission of specimen other than nasopharyngeal swab, presence of viral mutation(s) within the areas targeted by this assay, and inadequate number of viral copies (<250 copies / mL). A negative result must be combined with clinical observations, patient history, and epidemiological information.  Fact Sheet for Patients:   https://www.patel.info/  Fact Sheet for Healthcare Providers: https://hall.com/  This test is not yet approved or  cleared by the [place] FDA and has been authorized for detection and/or diagnosis of SARS-CoV-2 by FDA under an Emergency Use Authorization (EUA).  This EUA will remain in effect (meaning this test can be used) for the duration of the COVID-19 declaration under Section  564(b)(1) of the Act, 21 U.S.C. section 360bbb-3(b)(1), unless the authorization is terminated or revoked sooner.  Performed at [facility], [place] [address] [place], [place] [address]   Blood culture (routine x 2)     Status: None (Preliminary result)   Collection Time: 01/27/2022  4:00 PM   Specimen: BLOOD  Result Value Ref Range   Specimen Description      BLOOD BLOOD RIGHT FOREARM Performed at [place] [address], [place], [place] [address]    Special Requests      BOTTLES DRAWN AEROBIC AND ANAEROBIC Blood Culture results may not be optimal due to an excessive volume of blood received in culture bottles Performed at [place] [address] [place], [place] [address]    Culture      NO GROWTH < 12 HOURS Performed at [place] [address], [place], [place] [address]    Report Status PENDING   Blood culture (routine x 2)     Status: None (Preliminary result)  Collection Time: 02/15/2022  4:00 PM   Specimen: BLOOD  Result Value Ref Range   Specimen Description      BLOOD LEFT ANTECUBITAL Performed at [facility], [place] [address] [place], [place] [address]    Special Requests      BOTTLES DRAWN AEROBIC AND ANAEROBIC Blood Culture adequate volume Performed at [place] [address], [place], [place] [address]    Culture      NO GROWTH < 12 HOURS Performed at [place] [address] [place],  [address]    Report Status PENDING   CBC with Differential     Status: Abnormal   Collection Time: 02/02/2022  4:34 PM  Result Value Ref Range   WBC 12.4 (H) 4.0 - 10.5 K/uL   RBC 3.90 3.87 - 5.11 MIL/uL   Hemoglobin 12.2 12.0 - 15.0 g/dL   HCT 38.4 36.0 - 46.0 %   MCV 98.5 80.0 - 100.0 fL   MCH 31.3 26.0 - 34.0 pg   MCHC 31.8 30.0 - 36.0 g/dL   RDW 14.1 11.5 - 15.5 %   Platelets 406 (H) 150 - 400 K/uL   nRBC 0.0 0.0 - 0.2 %    Neutrophils Relative % 94 %   Neutro Abs 11.6 (H) 1.7 - 7.7 K/uL   Lymphocytes Relative 5 %   Lymphs Abs 0.6 (L) 0.7 - 4.0 K/uL   Monocytes Relative 1 %   Monocytes Absolute 0.1 0.1 - 1.0 K/uL   Eosinophils Relative 0 %   Eosinophils Absolute 0.0 0.0 - 0.5 K/uL   Basophils Relative 0 %   Basophils Absolute 0.0 0.0 - 0.1 K/uL   Immature Granulocytes 0 %   Abs Immature Granulocytes 0.05 0.00 - 0.07 K/uL    Comment: Performed at [facility], [place] [address] [place],  [address]  Comprehensive metabolic panel     Status: Abnormal   Collection Time: 02/11/2022  4:34 PM  Result Value Ref Range   Sodium 133 (L) 135 - 145 mmol/L   Potassium 4.3 3.5 - 5.1 mmol/L   Chloride 100 98 - 111 mmol/L   CO2 23 22 - 32 mmol/L   Glucose, Bld 149 (H) 70 - 99 mg/dL    Comment: Glucose reference range applies only to samples taken after fasting for at least 8 hours.   BUN 36 (H) 8 - 23 mg/dL   Creatinine, Ser 1.00 0.44 - 1.00 mg/dL   Calcium 9.4 8.9 - 10.3 mg/dL   Total Protein 7.4 6.5 - 8.1 g/dL   Albumin 3.3 (L) 3.5 - 5.0 g/dL   AST 19 15 - 41 U/L   ALT 10 0 - 44 U/L   Alkaline Phosphatase 53 38 - 126 U/L   Total Bilirubin 0.9 0.3 - 1.2 mg/dL   GFR, Estimated 55 (L) >60 mL/min    Comment: (NOTE) Calculated using the CKD-EPI Creatinine Equation (2021)    Anion gap 10 5 - 15    Comment: Performed at [facility], [place] [address], [place], [place] [address]  Troponin I (High Sensitivity)     Status: Abnormal   Collection Time: 02/12/2022  4:34 PM  Result Value Ref Range   Troponin I (High Sensitivity) 47 (H) <18 ng/L    Comment: (NOTE) Elevated high sensitivity troponin I (hsTnI) values and significant  changes across serial measurements may suggest ACS but many other  chronic and acute conditions are known to elevate  hsTnI results.  Refer to the "Links" section for chest pain algorithms and additional  guidance. Performed at [facility], [place] [address] [place], [place] [address]   Blood gas, venous (at [facility] and [facility], not at [facility])     Status: Abnormal   Collection Time: 01/30/2022  4:34 PM  Result Value Ref Range   pH, Ven 7.41 7.25 - 7.43   pCO2, Ven 40 (L) 44 - 60 mmHg   pO2, Ven 46 (H) 32 - 45 mmHg   Bicarbonate 25.4 20.0 - 28.0 mmol/L   Acid-Base Excess 0.7 0.0 - 2.0 mmol/L   O2 Saturation 76.9 %   Patient temperature 37.0     Comment: Performed at [facility], [place] [address] [place], [place] [address]  Brain natriuretic peptide     Status: Abnormal   Collection Time: 02/02/2022  4:35 PM  Result Value Ref Range   B Natriuretic Peptide 430.9 (H) 0.0 - 100.0 pg/mL    Comment: Performed at [facility], [place] [address] [place], [place] [address]  Lipase, blood     Status: None   Collection Time: 02/02/2022  4:35 PM  Result Value Ref Range   Lipase 24 11 - 51 U/L    Comment: Performed at [facility], [place] [address] [place], [place] [address]  I-stat chem 8, ED (not at [facility] or [facility])     Status: Abnormal   Collection Time: 01/29/2022  4:35 PM  Result Value Ref Range   Sodium 133 (L) 135 - 145 mmol/L   Potassium 4.3 3.5 - 5.1 mmol/L   Chloride 99 98 - 111 mmol/L   BUN 35 (H) 8 - 23 mg/dL   Creatinine, Ser 0.90 0.44 - 1.00 mg/dL   Glucose, Bld 145 (H) 70 - 99 mg/dL    Comment: Glucose reference range applies only to samples taken after fasting for at least 8 hours.   Calcium, Ion 1.25 1.15 - 1.40 mmol/L   TCO2 24 22 - 32 mmol/L   Hemoglobin 13.6 12.0 - 15.0 g/dL   HCT 40.0 36.0 - 46.0 %  Lactic acid, plasma     Status: None   Collection Time: 01/25/2022  4:36 PM  Result Value Ref Range   Lactic Acid, Venous 1.1 0.5 - 1.9 mmol/L    Comment: Performed at [facility], [place] [address], [place], [place] [address]  MRSA Next Gen by PCR, Nasal     Status: None   Collection Time: 02/05/2022  4:37 PM   Specimen: Nasal Swab  Result Value Ref  Range   MRSA by PCR Next Gen NOT DETECTED NOT DETECTED    Comment: (NOTE) The GeneXpert MRSA Assay (FDA approved for NASAL specimens only), is one component of a comprehensive MRSA colonization surveillance program. It is not intended to diagnose MRSA infection nor to guide or monitor treatment for MRSA infections. Test performance is not FDA approved in patients less than 42 years old. Performed at [facility], [place] [address], [place],  [address]   CBG monitoring, ED     Status: Abnormal   Collection Time: 02/05/2022  5:02 PM  Result Value Ref Range   Glucose-Capillary 129 (H) 70 - 99 mg/dL    Comment: Glucose reference range applies only to samples taken after fasting for at least 8 hours.  Troponin I (High Sensitivity)     Status: Abnormal   Collection Time: 02/02/2022  6:56 PM  Result Value Ref Range   Troponin I (High Sensitivity) 40 (H) <18 ng/L    Comment: (NOTE) Elevated high sensitivity troponin I (hsTnI) values and significant  changes across serial measurements may suggest ACS but many other  chronic and acute conditions are known to elevate hsTnI results.  Refer to the "Links" section for chest pain algorithms and additional  guidance. Performed at [facility], [place] [address] [place], [place] [address]   CBC     Status: Abnormal   Collection Time: 02/04/2022 10:18 PM  Result Value Ref Range   WBC 12.0 (H) 4.0 - 10.5 K/uL   RBC 3.69 (L) 3.87 - 5.11 MIL/uL   Hemoglobin 11.5 (L) 12.0 - 15.0 g/dL   HCT 36.0 36.0 - 46.0 %   MCV 97.6 80.0 - 100.0 fL   MCH 31.2 26.0 - 34.0 pg   MCHC 31.9 30.0 - 36.0 g/dL   RDW 13.9 11.5 - 15.5 %   Platelets 410 (H) 150 - 400 K/uL   nRBC 0.0 0.0 - 0.2 %    Comment: Performed at [facility], [place] [address], [place], [place] [address]  Creatinine, serum     Status: None   Collection Time: 02/05/2022 10:18 PM  Result Value Ref Range   Creatinine, Ser 0.83 0.44 - 1.00 mg/dL    GFR, Estimated >60 >60 mL/min    Comment: (NOTE) Calculated using the CKD-EPI Creatinine Equation (2021) Performed at [facility], [place] [address], [place], [place] [address]   CBG monitoring, ED     Status: Abnormal   Collection Time: 02/04/22  1:10 AM  Result Value Ref Range   Glucose-Capillary 117 (H) 70 - 99 mg/dL    Comment: Glucose reference range applies only to samples taken after fasting for at least 8 hours.  CBG monitoring, ED     Status: Abnormal   Collection Time: 02/04/22  3:55 AM  Result Value Ref Range   Glucose-Capillary 108 (H) 70 - 99 mg/dL    Comment: Glucose reference range applies only to samples taken after fasting for at least 8 hours.  Hemoglobin A1c     Status: None   Collection Time: 02/04/22  5:00 AM  Result Value Ref Range   Hgb A1c MFr Bld 5.6 4.8 - 5.6 %    Comment: (NOTE) Pre diabetes:          5.7%-6.4%  Diabetes:              >6.4%  Glycemic control for   <7.0% adults with diabetes    Mean Plasma Glucose 114.02 mg/dL    Comment: Performed at [place] [address] [place], [place] [address]  Basic metabolic panel     Status: Abnormal   Collection Time: 02/04/22  5:00 AM  Result Value Ref Range   Sodium 136 135 - 145 mmol/L   Potassium 3.9 3.5 - 5.1 mmol/L   Chloride 103 98 - 111 mmol/L   CO2 23 22 - 32 mmol/L   Glucose, Bld 119 (H) 70 - 99 mg/dL    Comment: Glucose reference range applies only to samples taken after fasting for at least 8 hours.   BUN 32 (H) 8 - 23 mg/dL   Creatinine, Ser 0.90 0.44 - 1.00 mg/dL   Calcium 9.2 8.9 - 10.3 mg/dL   GFR, Estimated >60 >60 mL/min    Comment: (NOTE) Calculated using the CKD-EPI Creatinine Equation (2021)    Anion gap  10 5 - 15    Comment: Performed at [facility], [place] [address] [place], [place] [address]  CBC with Differential/Platelet     Status: Abnormal   Collection Time: 02/04/22  5:00 AM  Result Value Ref Range   WBC 15.7 (H) 4.0 -  10.5 K/uL   RBC 3.85 (L) 3.87 - 5.11 MIL/uL   Hemoglobin 12.0 12.0 - 15.0 g/dL   HCT 37.9 36.0 - 46.0 %   MCV 98.4 80.0 - 100.0 fL   MCH 31.2 26.0 - 34.0 pg   MCHC 31.7 30.0 - 36.0 g/dL   RDW 14.1 11.5 - 15.5 %   Platelets 492 (H) 150 - 400 K/uL   nRBC 0.0 0.0 - 0.2 %   Neutrophils Relative % 88 %   Neutro Abs 13.8 (H) 1.7 - 7.7 K/uL   Lymphocytes Relative 6 %   Lymphs Abs 0.9 0.7 - 4.0 K/uL   Monocytes Relative 5 %   Monocytes Absolute 0.8 0.1 - 1.0 K/uL   Eosinophils Relative 0 %   Eosinophils Absolute 0.0 0.0 - 0.5 K/uL   Basophils Relative 0 %   Basophils Absolute 0.0 0.0 - 0.1 K/uL   Immature Granulocytes 1 %   Abs Immature Granulocytes 0.09 (H) 0.00 - 0.07 K/uL    Comment: Performed at [facility], [place] [address] [place], [place] [address]  Magnesium     Status: None   Collection Time: 02/04/22  5:00 AM  Result Value Ref Range   Magnesium 2.1 1.7 - 2.4 mg/dL    Comment: Performed at [facility], [place] [address], [place], [place] [address]  Phosphorus     Status: None   Collection Time: 02/04/22  5:00 AM  Result Value Ref Range   Phosphorus 2.7 2.5 - 4.6 mg/dL    Comment: Performed at [facility], [place] [address] [place], [place] [address]  CBG monitoring, ED     Status: Abnormal   Collection Time: 02/04/22  8:36 AM  Result Value Ref Range   Glucose-Capillary 110 (H) 70 - 99 mg/dL    Comment: Glucose reference range applies only to samples taken after fasting for at least 8 hours.   DG Abd Portable 1V  Result Date: 02/04/2022 CLINICAL DATA:  [id number] SBO (small bowel obstruction) ([place]) [id number], abdominal pain, nausea, vomiting and abdominal distension EXAM: PORTABLE ABDOMEN - 1 VIEW COMPARISON:  09/22/2010 abdominal radiograph, 01/23/2022 CT abdomen/pelvis FINDINGS: Diffuse mild-to-moderate small bowel dilatation up to 4.1 cm diameter, not appreciably changed from CT. Excreted contrast noted in the mildly  distended bladder. No evidence of pneumatosis or pneumoperitoneum. Bilateral posterior lumbar spinal fusion hardware. IMPRESSION: No appreciable change in diffuse mild-to-moderate small bowel dilatation, compatible with distal small bowel obstruction. Electronically Signed   By: Ilona Sorrel M.D.   On: 02/04/2022 09:00   CT Angio Chest PE W and/or Wo Contrast  Result Date: 02/14/2022 CLINICAL DATA:  Pulmonary embolism (PE) suspected, high prob; Abdominal pain, acute, nonlocalized. Pt reports she was diagnosed with pneumonia and given abx Monday and had a repeat xray yesterday that showed worsening pneumonia. Per EMS crackles heard in all lung areas. Pt c/o substernal pain on inspiration. EXAM: CT ANGIOGRAPHY CHEST CT ABDOMEN AND PELVIS WITH CONTRAST TECHNIQUE: Multidetector CT imaging of the chest was performed using the standard protocol during bolus administration of intravenous contrast. Multiplanar CT image reconstructions and MIPs were obtained to evaluate the vascular anatomy. Multidetector CT imaging of the abdomen  and pelvis was performed using the standard protocol during bolus administration of intravenous contrast. RADIATION DOSE REDUCTION: This exam was performed according to the departmental dose-optimization program which includes automated exposure control, adjustment of the mA and/or kV according to patient size and/or use of iterative reconstruction technique. CONTRAST:  18m OMNIPAQUE IOHEXOL 350 MG/ML SOLN COMPARISON:  CT abdomen pelvis 01/31/2022 FINDINGS: CTA CHEST FINDINGS Cardiovascular: Satisfactory opacification of the pulmonary arteries to the segmental level. No evidence of pulmonary embolism. Normal heart size. No significant pericardial effusion. The thoracic aorta is normal in caliber. Mild-to-moderate atherosclerotic plaque of the thoracic aorta. At least 3 vessel coronary artery calcifications. Mediastinum/Nodes: No enlarged mediastinal, hilar, or axillary lymph nodes. Thyroid  gland, trachea, and esophagus demonstrate no significant findings. Large hiatal hernia. Lungs/Pleura: Right lower lobe passive atelectasis. Persistent left lower lobe tree-in-bud nodularity. No focal consolidation. No pulmonary nodule. No pulmonary mass. Trace right pleural effusion. Trace left pleural effusion. No pneumothorax. Musculoskeletal: Bilateral breast implants. No suspicious lytic or blastic osseous lesions. No acute displaced fracture. Multilevel degenerative changes of the spine. Chronic T10 anterior wedge compression fracture. Bilateral shoulder degenerative changes. Review of the MIP images confirms the above findings. CT ABDOMEN and PELVIS FINDINGS Hepatobiliary: No focal liver abnormality. Question layering density within the gallbladder lumen suggestive of fat carious excretion of previously administered intravenous contrast. No gallstones, gallbladder wall thickening, or pericholecystic fluid. No biliary dilatation. Pancreas: No focal lesion. Normal pancreatic contour. No surrounding inflammatory changes. No main pancreatic ductal dilatation. Spleen: Normal in size without focal abnormality. Adrenals/Urinary Tract: No adrenal nodule bilaterally. Bilateral kidneys enhance symmetrically. Subcentimeter hypodensities are too small to characterize. No hydronephrosis. No hydroureter. The urinary bladder is unremarkable. On delayed imaging, there is no urothelial wall thickening and there are no filling defects in the opacified portions of the bilateral collecting systems or ureters. Stomach/Bowel: Stomach is within normal limits. Multiple loops of small bowel demonstrate air-fluid levels on are dilated up to 3 cm. Question developing transition point within the mid abdomen (2:50, 5:48). No evidence of large bowel wall thickening or dilatation. Colonic diverticulosis. Appendix appears normal. Vascular/Lymphatic: No abdominal aorta or iliac aneurysm. Moderate atherosclerotic plaque of the aorta and its  branches. No abdominal, pelvic, or inguinal lymphadenopathy. Reproductive: Status post hysterectomy. No adnexal masses. Other: No intraperitoneal free fluid. No intraperitoneal free gas. No organized fluid collection. Musculoskeletal: L1 - S1 posterolateral and interbody surgical hardware fusion. No suspicious lytic or blastic osseous lesions. No acute displaced fracture. Multilevel degenerative changes of the spine. Review of the MIP images confirms the above findings. IMPRESSION: 1. No pulmonary embolus. 2. Persistent left lower lobe tree-in-bud nodularity suggestive of infection/inflammation. 3. Trace bilateral pleural effusions. 4. Large hiatal hernia. 5. Developing small bowel obstruction with query transition point within the mid abdomen. Small bowel measures up to 3 cm with air-fluid levels. 6. Colonic diverticulosis with no acute diverticulitis. 7.  Aortic Atherosclerosis (ICD10-I70.0). Electronically Signed   By: MIven FinnM.D.   On: 02/02/2022 19:30   CT ABDOMEN PELVIS W CONTRAST  Result Date: 01/29/2022 CLINICAL DATA:  Pulmonary embolism (PE) suspected, high prob; Abdominal pain, acute, nonlocalized. Pt reports she was diagnosed with pneumonia and given abx Monday and had a repeat xray yesterday that showed worsening pneumonia. Per EMS crackles heard in all lung areas. Pt c/o substernal pain on inspiration. EXAM: CT ANGIOGRAPHY CHEST CT ABDOMEN AND PELVIS WITH CONTRAST TECHNIQUE: Multidetector CT imaging of the chest was performed using the standard protocol during bolus administration of intravenous  contrast. Multiplanar CT image reconstructions and MIPs were obtained to evaluate the vascular anatomy. Multidetector CT imaging of the abdomen and pelvis was performed using the standard protocol during bolus administration of intravenous contrast. RADIATION DOSE REDUCTION: This exam was performed according to the departmental dose-optimization program which includes automated exposure control,  adjustment of the mA and/or kV according to patient size and/or use of iterative reconstruction technique. CONTRAST:  138m OMNIPAQUE IOHEXOL 350 MG/ML SOLN COMPARISON:  CT abdomen pelvis 01/31/2022 FINDINGS: CTA CHEST FINDINGS Cardiovascular: Satisfactory opacification of the pulmonary arteries to the segmental level. No evidence of pulmonary embolism. Normal heart size. No significant pericardial effusion. The thoracic aorta is normal in caliber. Mild-to-moderate atherosclerotic plaque of the thoracic aorta. At least 3 vessel coronary artery calcifications. Mediastinum/Nodes: No enlarged mediastinal, hilar, or axillary lymph nodes. Thyroid gland, trachea, and esophagus demonstrate no significant findings. Large hiatal hernia. Lungs/Pleura: Right lower lobe passive atelectasis. Persistent left lower lobe tree-in-bud nodularity. No focal consolidation. No pulmonary nodule. No pulmonary mass. Trace right pleural effusion. Trace left pleural effusion. No pneumothorax. Musculoskeletal: Bilateral breast implants. No suspicious lytic or blastic osseous lesions. No acute displaced fracture. Multilevel degenerative changes of the spine. Chronic T10 anterior wedge compression fracture. Bilateral shoulder degenerative changes. Review of the MIP images confirms the above findings. CT ABDOMEN and PELVIS FINDINGS Hepatobiliary: No focal liver abnormality. Question layering density within the gallbladder lumen suggestive of fat carious excretion of previously administered intravenous contrast. No gallstones, gallbladder wall thickening, or pericholecystic fluid. No biliary dilatation. Pancreas: No focal lesion. Normal pancreatic contour. No surrounding inflammatory changes. No main pancreatic ductal dilatation. Spleen: Normal in size without focal abnormality. Adrenals/Urinary Tract: No adrenal nodule bilaterally. Bilateral kidneys enhance symmetrically. Subcentimeter hypodensities are too small to characterize. No  hydronephrosis. No hydroureter. The urinary bladder is unremarkable. On delayed imaging, there is no urothelial wall thickening and there are no filling defects in the opacified portions of the bilateral collecting systems or ureters. Stomach/Bowel: Stomach is within normal limits. Multiple loops of small bowel demonstrate air-fluid levels on are dilated up to 3 cm. Question developing transition point within the mid abdomen (2:50, 5:48). No evidence of large bowel wall thickening or dilatation. Colonic diverticulosis. Appendix appears normal. Vascular/Lymphatic: No abdominal aorta or iliac aneurysm. Moderate atherosclerotic plaque of the aorta and its branches. No abdominal, pelvic, or inguinal lymphadenopathy. Reproductive: Status post hysterectomy. No adnexal masses. Other: No intraperitoneal free fluid. No intraperitoneal free gas. No organized fluid collection. Musculoskeletal: L1 - S1 posterolateral and interbody surgical hardware fusion. No suspicious lytic or blastic osseous lesions. No acute displaced fracture. Multilevel degenerative changes of the spine. Review of the MIP images confirms the above findings. IMPRESSION: 1. No pulmonary embolus. 2. Persistent left lower lobe tree-in-bud nodularity suggestive of infection/inflammation. 3. Trace bilateral pleural effusions. 4. Large hiatal hernia. 5. Developing small bowel obstruction with query transition point within the mid abdomen. Small bowel measures up to 3 cm with air-fluid levels. 6. Colonic diverticulosis with no acute diverticulitis. 7.  Aortic Atherosclerosis (ICD10-I70.0). Electronically Signed   By: MIven FinnM.D.   On: 02/09/2022 19:30   DG Chest 2 View  Result Date: 01/21/2022 CLINICAL DATA:  Cough. Patient reports being diagnosed with pneumonia and given antibiotics Monday. Substernal chest pain with inspiration. EXAM: CHEST - 2 VIEW COMPARISON:  Radiographs 01/31/2022 and 10/28/2021. Chest CT 10/24/2021. Abdominal CT 01/31/2022.  FINDINGS: Persistent low lung volumes with chronic elevation of the right hemidiaphragm. The heart size and mediastinal contours are stable.  There is a large hiatal hernia. There are increased patchy airspace opacities at both lung bases with a possible small right pleural effusion. No evidence of pneumothorax. Postsurgical changes are present within the lumbar spine, incompletely visualized. Telemetry leads overlie the chest. IMPRESSION: 1. Increased patchy bibasilar airspace opacities with possible small right pleural effusion, suspicious for pneumonia. 2. Chronic elevation of the right hemidiaphragm and large hiatal hernia. Electronically Signed   By: Richardean Sale M.D.   On: 02/07/2022 14:49    Anti-infectives (From admission, onward)    Start     Dose/Rate Route Frequency Ordered Stop   02/04/22 0600  ceFEPIme (MAXIPIME) 2 g in sodium chloride 0.9 % 100 mL IVPB  Status:  Discontinued        2 g 200 mL/hr over 30 Minutes Intravenous Every 12 hours 01/19/2022 1534 02/08/2022 1541   02/04/22 0400  ceFEPIme (MAXIPIME) 2 g in sodium chloride 0.9 % 100 mL IVPB        2 g 200 mL/hr over 30 Minutes Intravenous Every 12 hours 02/02/2022 2226     02/09/2022 1545  ceFEPIme (MAXIPIME) 2 g in sodium chloride 0.9 % 100 mL IVPB        2 g 200 mL/hr over 30 Minutes Intravenous  Once 01/31/2022 1528 02/12/2022 1636   01/26/2022 1515  vancomycin (VANCOCIN) IVPB 1000 mg/200 mL premix        1,000 mg 200 mL/hr over 60 Minutes Intravenous  Once 01/19/2022 1510 02/12/2022 2037       Assessment/Plan SBO - CT w/ dilated loops of small bowel with query of transition point in the mid abdomen the small bowel dilated up to 3 cm.  Plain film from this morning with small bowel dilated to 4.1 cm.  She does have history of prior open appendectomy and hysterectomy.  Suspect SBO likely from adhesions.  - No current indication for emergency surgery - Place NGT for decompression and keep NPO - Start SBO protocol - Keep K > 4 and Mg > 2  for bowel function - Mobilize for bowel function - Hopefully patient will improve with conservative management. If patient fails to improve with conservative management, they may require exploratory surgery during admission.  Appears palliative has been consulted for [place].  - Agree with medical admission. We will follow with you.  FEN -NPO, IV fluid per [facility].  Placed NG tube VTE -SCDs.  Okay for chemical prophylaxis from a general surgery standpoint ID - Cefepime for pneumonia. Foley - None Dispo - Admit to [facility]. Place NGT. SBO protocol  PAF not on anticoagulation  Hypertension  PNA  Jillyn Ledger, [facility] 02/04/2022, 11:12 AM Please see Amion for pager number during day hours 7:00am-4:30pm

## 2022-02-04 NOTE — ED Notes (Signed)
ED TO INPATIENT HANDOFF REPORT  ED Nurse Name and Phone #:   S Name/Age/Gender [name] 88 y.o. female Room/Bed: WA13/WA13  Code Status   Code Status: Full Code  Home/SNF/Other Nursing Home Patient oriented to: self, place, time, and situation Is this baseline? Yes   Triage Complete: Triage complete  Chief Complaint Pneumonia [J18.9]  Triage Note Pt coming from [facility] via EMS. Pt reports she was diagnosed with pneumonia and given abx Monday and had a repeat xray yesterday that showed worsening pneumonia. Per EMS crackles heard in all lung areas. Pt c/o substernal pain on inspiration. Pt states that she does not feel any worse than she did Monday.   Allergies Allergies  Allergen Reactions   Ciprofloxacin Rash and Other (See Comments)    Burning    Level of Care/Admitting Diagnosis ED Disposition     ED Disposition  Admit   Condition  --   Comment  Hospital Area: [facility] [[id number]]  Level of Care: Progressive [102]  Admit to Progressive based on following criteria: GI, ENDOCRINE disease patients with GI bleeding, acute liver failure or pancreatitis, stable with diabetic ketoacidosis or thyrotoxicosis (hypothyroid) state.  Admit to Progressive based on following criteria: MULTISYSTEM THREATS such as stable sepsis, metabolic/electrolyte imbalance with or without encephalopathy that is responding to early treatment.  Admit to Progressive based on following criteria: RESPIRATORY PROBLEMS hypoxemic/hypercapnic respiratory failure that is responsive to NIPPV (BiPAP) or High Flow Nasal Cannula (6-80 lpm). Frequent assessment/intervention, no > Q2 hrs < Q4 hrs, to maintain oxygenation and pulmonary hygiene.  May [facility] patient to [facility] if equivalent level of care is available:: Yes  Covid Evaluation: Asymptomatic - no recent exposure (last 10 days) testing not required  Diagnosis: Pneumonia [227785]  Admitting Physician: Kayleen Memos [[id number]]  Attending Physician: Kayleen Memos [[id number]]  Certification:: I certify this patient will need inpatient services for at least 2 midnights  Estimated Length of Stay: 2          B Medical/Surgery History Past Medical History:  Diagnosis Date   Anemia 10-05-12   hgb-9.0 on 09-14-12   Arm vein blood clot 10-05-12   Rt. arm '12   Arthritis 10-05-12   degenerative joint disease,scoliosis spine   Aspiration pneumonia ([place])    Dysrhythmia 10-05-12   tachycardia -tx. Lopressor   GERD (gastroesophageal reflux disease)    H/O esophagitis    H/O hiatal hernia    Tachycardia    Transfusion history 10-05-12   4 units blood '12   Past Surgical History:  Procedure Laterality Date   ABDOMINAL HYSTERECTOMY     APPENDECTOMY     child   BACK SURGERY  10-05-12   x5-last fusion with plates   BREAST ENHANCEMENT SURGERY     CATARACT EXTRACTION, BILATERAL     COLONOSCOPY WITH PROPOFOL N/A 10/23/2012   Procedure: COLONOSCOPY WITH PROPOFOL;  Surgeon: Lear Ng, MD;  Location: [facility];  Service: Endoscopy;  Laterality: N/A;   HOT HEMOSTASIS N/A 10/23/2012   Procedure: HOT HEMOSTASIS (ARGON PLASMA COAGULATION/BICAP);  Surgeon: Lear Ng, MD;  Location: [facility];  Service: Endoscopy;  Laterality: N/A;     A IV Location/Drains/Wounds Patient Lines/Drains/Airways Status     Active Line/Drains/Airways     Name Placement date Placement time Site Days   Peripheral IV 02/04/22 22 G Anterior;Left Forearm 02/04/22  0831  Forearm  less than 1   External Urinary Catheter 02/05/2022  2114  --  1            Intake/Output Last 24 hours  Intake/Output Summary (Last 24 hours) at 02/04/2022 1537 Last data filed at 02/04/2022 0846 Gross per 24 hour  Intake 100 ml  Output --  Net 100 ml    Labs/Imaging Results for orders placed or performed during the hospital encounter of 01/29/2022 (from the past 48 hour(s))  SARS Coronavirus 2 by RT PCR (hospital order,  performed in [facility] hospital lab) *cepheid single result test* Anterior Nasal Swab     Status: None   Collection Time: 01/17/2022  2:25 PM   Specimen: Anterior Nasal Swab  Result Value Ref Range   SARS Coronavirus 2 by RT PCR NEGATIVE NEGATIVE    Comment: (NOTE) SARS-CoV-2 target nucleic acids are NOT DETECTED.  The SARS-CoV-2 RNA is generally detectable in upper and lower respiratory specimens during the acute phase of infection. The lowest concentration of SARS-CoV-2 viral copies this assay can detect is 250 copies / mL. A negative result does not preclude SARS-CoV-2 infection and should not be used as the sole basis for treatment or other patient management decisions.  A negative result may occur with improper specimen collection / handling, submission of specimen other than nasopharyngeal swab, presence of viral mutation(s) within the areas targeted by this assay, and inadequate number of viral copies (<250 copies / mL). A negative result must be combined with clinical observations, patient history, and epidemiological information.  Fact Sheet for Patients:   https://www.patel.info/  Fact Sheet for Healthcare Providers: https://hall.com/  This test is not yet approved or  cleared by the [place] FDA and has been authorized for detection and/or diagnosis of SARS-CoV-2 by FDA under an Emergency Use Authorization (EUA).  This EUA will remain in effect (meaning this test can be used) for the duration of the COVID-19 declaration under Section 564(b)(1) of the Act, 21 U.S.C. section 360bbb-3(b)(1), unless the authorization is terminated or revoked sooner.  Performed at [facility], [place] [address], [place], [place] [address]   Blood culture (routine x 2)     Status: None (Preliminary result)   Collection Time: 01/28/2022  4:00 PM   Specimen: BLOOD  Result Value Ref Range   Specimen Description      BLOOD BLOOD  RIGHT FOREARM Performed at [facility] [address], [place], [place] [address]    Special Requests      BOTTLES DRAWN AEROBIC AND ANAEROBIC Blood Culture results may not be optimal due to an excessive volume of blood received in culture bottles Performed at [facility] [address], [place], [place] [address]    Culture      NO GROWTH < 12 HOURS Performed at [facility] [address] [place], [place] [address]    Report Status PENDING   Blood culture (routine x 2)     Status: None (Preliminary result)   Collection Time: 01/30/2022  4:00 PM   Specimen: BLOOD  Result Value Ref Range   Specimen Description      BLOOD LEFT ANTECUBITAL Performed at [place] [address] [place], [place] [address]    Special Requests      BOTTLES DRAWN AEROBIC AND ANAEROBIC Blood Culture adequate volume Performed at [facility] [address] [place], [place] [address]    Culture      NO GROWTH < 12 HOURS Performed at [facility], [address] [place], [place] [address]    Report Status PENDING   CBC with Differential     Status: Abnormal   Collection Time: 01/25/2022  4:34 PM  Result Value Ref Range   WBC 12.4 (H) 4.0 - 10.5 K/uL   RBC 3.90 3.87 - 5.11 MIL/uL   Hemoglobin 12.2 12.0 - 15.0 g/dL   HCT 38.4 36.0 - 46.0 %   MCV 98.5 80.0 - 100.0 fL   MCH 31.3 26.0 - 34.0 pg   MCHC 31.8 30.0 - 36.0 g/dL   RDW 14.1 11.5 - 15.5 %   Platelets 406 (H) 150 - 400 K/uL   nRBC 0.0 0.0 - 0.2 %   Neutrophils Relative % 94 %   Neutro Abs 11.6 (H) 1.7 - 7.7 K/uL   Lymphocytes Relative 5 %   Lymphs Abs 0.6 (L) 0.7 - 4.0 K/uL   Monocytes Relative 1 %   Monocytes Absolute 0.1 0.1 - 1.0 K/uL   Eosinophils Relative 0 %   Eosinophils Absolute 0.0 0.0 - 0.5 K/uL   Basophils Relative 0 %   Basophils Absolute 0.0 0.0 - 0.1 K/uL   Immature Granulocytes 0 %   Abs Immature Granulocytes 0.05 0.00 - 0.07  K/uL    Comment: Performed at [facility], [place] [address], [place], [place] [address]  Comprehensive metabolic panel     Status: Abnormal   Collection Time: 01/20/2022  4:34 PM  Result Value Ref Range   Sodium 133 (L) 135 - 145 mmol/L   Potassium 4.3 3.5 - 5.1 mmol/L   Chloride 100 98 - 111 mmol/L   CO2 23 22 - 32 mmol/L   Glucose, Bld 149 (H) 70 - 99 mg/dL    Comment: Glucose reference range applies only to samples taken after fasting for at least 8 hours.   BUN 36 (H) 8 - 23 mg/dL   Creatinine, Ser 1.00 0.44 - 1.00 mg/dL   Calcium 9.4 8.9 - 10.3 mg/dL   Total Protein 7.4 6.5 - 8.1 g/dL   Albumin 3.3 (L) 3.5 - 5.0 g/dL   AST 19 15 - 41 U/L   ALT 10 0 - 44 U/L   Alkaline Phosphatase 53 38 - 126 U/L   Total Bilirubin 0.9 0.3 - 1.2 mg/dL   GFR, Estimated 55 (L) >60 mL/min    Comment: (NOTE) Calculated using the CKD-EPI Creatinine Equation (2021)    Anion gap 10 5 - 15    Comment: Performed at [facility], [place] [address], [place], [place] [address]  Troponin I (High Sensitivity)     Status: Abnormal   Collection Time: 02/05/2022  4:34 PM  Result Value Ref Range   Troponin I (High Sensitivity) 47 (H) <18 ng/L    Comment: (NOTE) Elevated high sensitivity troponin I (hsTnI) values and significant  changes across serial measurements may suggest ACS but many other  chronic and acute conditions are known to elevate hsTnI results.  Refer to the "Links" section for chest pain algorithms and additional  guidance. Performed at [facility], [place] [address], [place], [place] [address]   Blood gas, venous (at [facility] and [facility], not at [facility])     Status: Abnormal   Collection Time: 02/07/2022  4:34 PM  Result Value Ref Range   pH, Ven 7.41 7.25 - 7.43   pCO2, Ven 40 (L) 44 - 60 mmHg   pO2, Ven 46 (H) 32 - 45 mmHg   Bicarbonate  25.4 20.0 - 28.0 mmol/L   Acid-Base Excess 0.7 0.0 - 2.0 mmol/L   O2 Saturation 76.9 %   Patient temperature 37.0      Comment: Performed at [facility], [place] [address] [place], [place] [address]  Brain natriuretic peptide     Status: Abnormal   Collection Time: 01/31/2022  4:35 PM  Result Value Ref Range   B Natriuretic Peptide 430.9 (H) 0.0 - 100.0 pg/mL    Comment: Performed at [facility], [place] [address] [place], [place] [address]  Lipase, blood     Status: None   Collection Time: 01/20/2022  4:35 PM  Result Value Ref Range   Lipase 24 11 - 51 U/L    Comment: Performed at [facility], [place] [address] [place], [place] [address]  I-stat chem 8, ED (not at [facility] or [facility])     Status: Abnormal   Collection Time: 02/14/2022  4:35 PM  Result Value Ref Range   Sodium 133 (L) 135 - 145 mmol/L   Potassium 4.3 3.5 - 5.1 mmol/L   Chloride 99 98 - 111 mmol/L   BUN 35 (H) 8 - 23 mg/dL   Creatinine, Ser 0.90 0.44 - 1.00 mg/dL   Glucose, Bld 145 (H) 70 - 99 mg/dL    Comment: Glucose reference range applies only to samples taken after fasting for at least 8 hours.   Calcium, Ion 1.25 1.15 - 1.40 mmol/L   TCO2 24 22 - 32 mmol/L   Hemoglobin 13.6 12.0 - 15.0 g/dL   HCT 40.0 36.0 - 46.0 %  Lactic acid, plasma     Status: None   Collection Time: 01/18/2022  4:36 PM  Result Value Ref Range   Lactic Acid, Venous 1.1 0.5 - 1.9 mmol/L    Comment: Performed at [facility], [place] [address] [place], [place] [address]  MRSA Next Gen by PCR, Nasal     Status: None   Collection Time: 01/26/2022  4:37 PM   Specimen: Nasal Swab  Result Value Ref Range   MRSA by PCR Next Gen NOT DETECTED NOT DETECTED    Comment: (NOTE) The GeneXpert MRSA Assay (FDA approved for NASAL specimens only), is one component of a comprehensive MRSA colonization surveillance program. It is not intended to diagnose MRSA infection nor to guide or monitor treatment for MRSA infections. Test performance is not FDA approved in patients less than 17 years old. Performed at [facility], [place] [address], [place], [place] [address]   CBG monitoring, ED     Status: Abnormal   Collection Time: 01/26/2022  5:02 PM  Result Value Ref Range   Glucose-Capillary 129 (H) 70 - 99 mg/dL    Comment: Glucose reference range applies only to samples taken after fasting for at least 8 hours.  Troponin I (High Sensitivity)     Status: Abnormal   Collection Time: 02/11/2022  6:56 PM  Result Value Ref Range   Troponin I (High Sensitivity) 40 (H) <18 ng/L    Comment: (NOTE) Elevated high sensitivity troponin I (hsTnI) values and significant  changes across serial measurements may suggest ACS but many other  chronic and acute conditions are known to elevate hsTnI results.  Refer to the "Links" section for chest pain algorithms and additional  guidance. Performed at [facility], [place] [address], [place], [place] [address]   CBC     Status: Abnormal   Collection Time: 02/01/2022  10:18 PM  Result Value Ref Range   WBC 12.0 (H) 4.0 - 10.5 K/uL   RBC 3.69 (L) 3.87 - 5.11 MIL/uL   Hemoglobin 11.5 (L) 12.0 - 15.0 g/dL   HCT 36.0 36.0 - 46.0 %   MCV 97.6 80.0 - 100.0 fL   MCH 31.2 26.0 - 34.0 pg   MCHC 31.9 30.0 - 36.0 g/dL   RDW 13.9 11.5 - 15.5 %   Platelets 410 (H) 150 - 400 K/uL   nRBC 0.0 0.0 - 0.2 %    Comment: Performed at [facility], [place] [address], [place], [place] [address]  Creatinine, serum     Status: None   Collection Time: 01/16/2022 10:18 PM  Result Value Ref Range   Creatinine, Ser 0.83 0.44 - 1.00 mg/dL   GFR, Estimated >60 >60 mL/min    Comment: (NOTE) Calculated using the CKD-EPI Creatinine Equation (2021) Performed at [facility], [place] [address], [place], [place] [address]   CBG monitoring, ED     Status: Abnormal   Collection Time: 02/04/22  1:10 AM  Result Value Ref Range   Glucose-Capillary 117 (H) 70 - 99 mg/dL    Comment: Glucose reference range applies only to samples taken  after fasting for at least 8 hours.  CBG monitoring, ED     Status: Abnormal   Collection Time: 02/04/22  3:55 AM  Result Value Ref Range   Glucose-Capillary 108 (H) 70 - 99 mg/dL    Comment: Glucose reference range applies only to samples taken after fasting for at least 8 hours.  Hemoglobin A1c     Status: None   Collection Time: 02/04/22  5:00 AM  Result Value Ref Range   Hgb A1c MFr Bld 5.6 4.8 - 5.6 %    Comment: (NOTE) Pre diabetes:          5.7%-6.4%  Diabetes:              >6.4%  Glycemic control for   <7.0% adults with diabetes    Mean Plasma Glucose 114.02 mg/dL    Comment: Performed at [place] [address], [place], [place] [address]  Basic metabolic panel     Status: Abnormal   Collection Time: 02/04/22  5:00 AM  Result Value Ref Range   Sodium 136 135 - 145 mmol/L   Potassium 3.9 3.5 - 5.1 mmol/L   Chloride 103 98 - 111 mmol/L   CO2 23 22 - 32 mmol/L   Glucose, Bld 119 (H) 70 - 99 mg/dL    Comment: Glucose reference range applies only to samples taken after fasting for at least 8 hours.   BUN 32 (H) 8 - 23 mg/dL   Creatinine, Ser 0.90 0.44 - 1.00 mg/dL   Calcium 9.2 8.9 - 10.3 mg/dL   GFR, Estimated >60 >60 mL/min    Comment: (NOTE) Calculated using the CKD-EPI Creatinine Equation (2021)    Anion gap 10 5 - 15    Comment: Performed at [facility], [place] [address] [place], [place] [address]  CBC with Differential/Platelet     Status: Abnormal   Collection Time: 02/04/22  5:00 AM  Result Value Ref Range   WBC 15.7 (H) 4.0 - 10.5 K/uL   RBC 3.85 (L) 3.87 - 5.11 MIL/uL   Hemoglobin 12.0 12.0 - 15.0 g/dL   HCT 37.9 36.0 - 46.0 %   MCV 98.4 80.0 - 100.0 fL   MCH 31.2 26.0 -  34.0 pg   MCHC 31.7 30.0 - 36.0 g/dL   RDW 14.1 11.5 - 15.5 %   Platelets 492 (H) 150 - 400 K/uL   nRBC 0.0 0.0 - 0.2 %   Neutrophils Relative % 88 %   Neutro Abs 13.8 (H) 1.7 - 7.7 K/uL   Lymphocytes Relative 6 %   Lymphs Abs 0.9 0.7 - 4.0 K/uL    Monocytes Relative 5 %   Monocytes Absolute 0.8 0.1 - 1.0 K/uL   Eosinophils Relative 0 %   Eosinophils Absolute 0.0 0.0 - 0.5 K/uL   Basophils Relative 0 %   Basophils Absolute 0.0 0.0 - 0.1 K/uL   Immature Granulocytes 1 %   Abs Immature Granulocytes 0.09 (H) 0.00 - 0.07 K/uL    Comment: Performed at [facility], [place] [address] [place], [place] [address]  Magnesium     Status: None   Collection Time: 02/04/22  5:00 AM  Result Value Ref Range   Magnesium 2.1 1.7 - 2.4 mg/dL    Comment: Performed at [facility], [place] [address], [place], [place] [address]  Phosphorus     Status: None   Collection Time: 02/04/22  5:00 AM  Result Value Ref Range   Phosphorus 2.7 2.5 - 4.6 mg/dL    Comment: Performed at [facility], [place] [address], [place], [place] [address]  CBG monitoring, ED     Status: Abnormal   Collection Time: 02/04/22  8:36 AM  Result Value Ref Range   Glucose-Capillary 110 (H) 70 - 99 mg/dL    Comment: Glucose reference range applies only to samples taken after fasting for at least 8 hours.  Troponin I (High Sensitivity)     Status: Abnormal   Collection Time: 02/04/22 12:57 PM  Result Value Ref Range   Troponin I (High Sensitivity) 43 (H) <18 ng/L    Comment: (NOTE) Elevated high sensitivity troponin I (hsTnI) values and significant  changes across serial measurements may suggest ACS but many other  chronic and acute conditions are known to elevate hsTnI results.  Refer to the "Links" section for chest pain algorithms and additional  guidance. Performed at [facility], [place] [address] [place], [place] [address]   CBG monitoring, ED     Status: Abnormal   Collection Time: 02/04/22  1:02 PM  Result Value Ref Range   Glucose-Capillary 115 (H) 70 - 99 mg/dL    Comment: Glucose reference range applies only to samples taken after fasting for at least 8 hours.   DG Abd Portable 1V  Result  Date: 02/04/2022 CLINICAL DATA:  [id number] SBO (small bowel obstruction) ([place]) [id number], abdominal pain, nausea, vomiting and abdominal distension EXAM: PORTABLE ABDOMEN - 1 VIEW COMPARISON:  09/22/2010 abdominal radiograph, 01/20/2022 CT abdomen/pelvis FINDINGS: Diffuse mild-to-moderate small bowel dilatation up to 4.1 cm diameter, not appreciably changed from CT. Excreted contrast noted in the mildly distended bladder. No evidence of pneumatosis or pneumoperitoneum. Bilateral posterior lumbar spinal fusion hardware. IMPRESSION: No appreciable change in diffuse mild-to-moderate small bowel dilatation, compatible with distal small bowel obstruction. Electronically Signed   By: Ilona Sorrel M.D.   On: 02/04/2022 09:00   CT Angio Chest PE W and/or Wo Contrast  Result Date: 02/08/2022 CLINICAL DATA:  Pulmonary embolism (PE) suspected, high prob; Abdominal pain, acute, nonlocalized. Pt reports she was diagnosed with pneumonia and given abx Monday and had a repeat xray yesterday that showed worsening pneumonia. Per EMS crackles heard in all  lung areas. Pt c/o substernal pain on inspiration. EXAM: CT ANGIOGRAPHY CHEST CT ABDOMEN AND PELVIS WITH CONTRAST TECHNIQUE: Multidetector CT imaging of the chest was performed using the standard protocol during bolus administration of intravenous contrast. Multiplanar CT image reconstructions and MIPs were obtained to evaluate the vascular anatomy. Multidetector CT imaging of the abdomen and pelvis was performed using the standard protocol during bolus administration of intravenous contrast. RADIATION DOSE REDUCTION: This exam was performed according to the departmental dose-optimization program which includes automated exposure control, adjustment of the mA and/or kV according to patient size and/or use of iterative reconstruction technique. CONTRAST:  133m OMNIPAQUE IOHEXOL 350 MG/ML SOLN COMPARISON:  CT abdomen pelvis 01/31/2022 FINDINGS: CTA CHEST FINDINGS Cardiovascular:  Satisfactory opacification of the pulmonary arteries to the segmental level. No evidence of pulmonary embolism. Normal heart size. No significant pericardial effusion. The thoracic aorta is normal in caliber. Mild-to-moderate atherosclerotic plaque of the thoracic aorta. At least 3 vessel coronary artery calcifications. Mediastinum/Nodes: No enlarged mediastinal, hilar, or axillary lymph nodes. Thyroid gland, trachea, and esophagus demonstrate no significant findings. Large hiatal hernia. Lungs/Pleura: Right lower lobe passive atelectasis. Persistent left lower lobe tree-in-bud nodularity. No focal consolidation. No pulmonary nodule. No pulmonary mass. Trace right pleural effusion. Trace left pleural effusion. No pneumothorax. Musculoskeletal: Bilateral breast implants. No suspicious lytic or blastic osseous lesions. No acute displaced fracture. Multilevel degenerative changes of the spine. Chronic T10 anterior wedge compression fracture. Bilateral shoulder degenerative changes. Review of the MIP images confirms the above findings. CT ABDOMEN and PELVIS FINDINGS Hepatobiliary: No focal liver abnormality. Question layering density within the gallbladder lumen suggestive of fat carious excretion of previously administered intravenous contrast. No gallstones, gallbladder wall thickening, or pericholecystic fluid. No biliary dilatation. Pancreas: No focal lesion. Normal pancreatic contour. No surrounding inflammatory changes. No main pancreatic ductal dilatation. Spleen: Normal in size without focal abnormality. Adrenals/Urinary Tract: No adrenal nodule bilaterally. Bilateral kidneys enhance symmetrically. Subcentimeter hypodensities are too small to characterize. No hydronephrosis. No hydroureter. The urinary bladder is unremarkable. On delayed imaging, there is no urothelial wall thickening and there are no filling defects in the opacified portions of the bilateral collecting systems or ureters. Stomach/Bowel: Stomach  is within normal limits. Multiple loops of small bowel demonstrate air-fluid levels on are dilated up to 3 cm. Question developing transition point within the mid abdomen (2:50, 5:48). No evidence of large bowel wall thickening or dilatation. Colonic diverticulosis. Appendix appears normal. Vascular/Lymphatic: No abdominal aorta or iliac aneurysm. Moderate atherosclerotic plaque of the aorta and its branches. No abdominal, pelvic, or inguinal lymphadenopathy. Reproductive: Status post hysterectomy. No adnexal masses. Other: No intraperitoneal free fluid. No intraperitoneal free gas. No organized fluid collection. Musculoskeletal: L1 - S1 posterolateral and interbody surgical hardware fusion. No suspicious lytic or blastic osseous lesions. No acute displaced fracture. Multilevel degenerative changes of the spine. Review of the MIP images confirms the above findings. IMPRESSION: 1. No pulmonary embolus. 2. Persistent left lower lobe tree-in-bud nodularity suggestive of infection/inflammation. 3. Trace bilateral pleural effusions. 4. Large hiatal hernia. 5. Developing small bowel obstruction with query transition point within the mid abdomen. Small bowel measures up to 3 cm with air-fluid levels. 6. Colonic diverticulosis with no acute diverticulitis. 7.  Aortic Atherosclerosis (ICD10-I70.0). Electronically Signed   By: MIven FinnM.D.   On: 02/06/2022 19:30   CT ABDOMEN PELVIS W CONTRAST  Result Date: 02/01/2022 CLINICAL DATA:  Pulmonary embolism (PE) suspected, high prob; Abdominal pain, acute, nonlocalized. Pt reports she was diagnosed with pneumonia  and given abx Monday and had a repeat xray yesterday that showed worsening pneumonia. Per EMS crackles heard in all lung areas. Pt c/o substernal pain on inspiration. EXAM: CT ANGIOGRAPHY CHEST CT ABDOMEN AND PELVIS WITH CONTRAST TECHNIQUE: Multidetector CT imaging of the chest was performed using the standard protocol during bolus administration of  intravenous contrast. Multiplanar CT image reconstructions and MIPs were obtained to evaluate the vascular anatomy. Multidetector CT imaging of the abdomen and pelvis was performed using the standard protocol during bolus administration of intravenous contrast. RADIATION DOSE REDUCTION: This exam was performed according to the departmental dose-optimization program which includes automated exposure control, adjustment of the mA and/or kV according to patient size and/or use of iterative reconstruction technique. CONTRAST:  175m OMNIPAQUE IOHEXOL 350 MG/ML SOLN COMPARISON:  CT abdomen pelvis 01/31/2022 FINDINGS: CTA CHEST FINDINGS Cardiovascular: Satisfactory opacification of the pulmonary arteries to the segmental level. No evidence of pulmonary embolism. Normal heart size. No significant pericardial effusion. The thoracic aorta is normal in caliber. Mild-to-moderate atherosclerotic plaque of the thoracic aorta. At least 3 vessel coronary artery calcifications. Mediastinum/Nodes: No enlarged mediastinal, hilar, or axillary lymph nodes. Thyroid gland, trachea, and esophagus demonstrate no significant findings. Large hiatal hernia. Lungs/Pleura: Right lower lobe passive atelectasis. Persistent left lower lobe tree-in-bud nodularity. No focal consolidation. No pulmonary nodule. No pulmonary mass. Trace right pleural effusion. Trace left pleural effusion. No pneumothorax. Musculoskeletal: Bilateral breast implants. No suspicious lytic or blastic osseous lesions. No acute displaced fracture. Multilevel degenerative changes of the spine. Chronic T10 anterior wedge compression fracture. Bilateral shoulder degenerative changes. Review of the MIP images confirms the above findings. CT ABDOMEN and PELVIS FINDINGS Hepatobiliary: No focal liver abnormality. Question layering density within the gallbladder lumen suggestive of fat carious excretion of previously administered intravenous contrast. No gallstones, gallbladder wall  thickening, or pericholecystic fluid. No biliary dilatation. Pancreas: No focal lesion. Normal pancreatic contour. No surrounding inflammatory changes. No main pancreatic ductal dilatation. Spleen: Normal in size without focal abnormality. Adrenals/Urinary Tract: No adrenal nodule bilaterally. Bilateral kidneys enhance symmetrically. Subcentimeter hypodensities are too small to characterize. No hydronephrosis. No hydroureter. The urinary bladder is unremarkable. On delayed imaging, there is no urothelial wall thickening and there are no filling defects in the opacified portions of the bilateral collecting systems or ureters. Stomach/Bowel: Stomach is within normal limits. Multiple loops of small bowel demonstrate air-fluid levels on are dilated up to 3 cm. Question developing transition point within the mid abdomen (2:50, 5:48). No evidence of large bowel wall thickening or dilatation. Colonic diverticulosis. Appendix appears normal. Vascular/Lymphatic: No abdominal aorta or iliac aneurysm. Moderate atherosclerotic plaque of the aorta and its branches. No abdominal, pelvic, or inguinal lymphadenopathy. Reproductive: Status post hysterectomy. No adnexal masses. Other: No intraperitoneal free fluid. No intraperitoneal free gas. No organized fluid collection. Musculoskeletal: L1 - S1 posterolateral and interbody surgical hardware fusion. No suspicious lytic or blastic osseous lesions. No acute displaced fracture. Multilevel degenerative changes of the spine. Review of the MIP images confirms the above findings. IMPRESSION: 1. No pulmonary embolus. 2. Persistent left lower lobe tree-in-bud nodularity suggestive of infection/inflammation. 3. Trace bilateral pleural effusions. 4. Large hiatal hernia. 5. Developing small bowel obstruction with query transition point within the mid abdomen. Small bowel measures up to 3 cm with air-fluid levels. 6. Colonic diverticulosis with no acute diverticulitis. 7.  Aortic  Atherosclerosis (ICD10-I70.0). Electronically Signed   By: MIven FinnM.D.   On: 01/29/2022 19:30   DG Chest 2 View  Result Date: 01/16/2022  CLINICAL DATA:  Cough. Patient reports being diagnosed with pneumonia and given antibiotics Monday. Substernal chest pain with inspiration. EXAM: CHEST - 2 VIEW COMPARISON:  Radiographs 01/31/2022 and 10/28/2021. Chest CT 10/24/2021. Abdominal CT 01/31/2022. FINDINGS: Persistent low lung volumes with chronic elevation of the right hemidiaphragm. The heart size and mediastinal contours are stable. There is a large hiatal hernia. There are increased patchy airspace opacities at both lung bases with a possible small right pleural effusion. No evidence of pneumothorax. Postsurgical changes are present within the lumbar spine, incompletely visualized. Telemetry leads overlie the chest. IMPRESSION: 1. Increased patchy bibasilar airspace opacities with possible small right pleural effusion, suspicious for pneumonia. 2. Chronic elevation of the right hemidiaphragm and large hiatal hernia. Electronically Signed   By: Richardean Sale M.D.   On: 02/10/2022 14:49    Pending Labs Unresulted Labs (From admission, onward)     Start     Ordered   02/10/22 0500  Creatinine, serum  (enoxaparin (LOVENOX)    CrCl >/= 30 ml/min)  Weekly,   R     Comments: while on enoxaparin therapy    02/04/2022 2108   02/05/22 6301  Basic metabolic panel  Daily at 5am,   R      02/04/22 0746   02/05/22 0500  CBC  Daily at 5am,   R      02/04/22 0746   02/05/22 0500  Brain natriuretic peptide  Tomorrow morning,   R        02/04/22 1204            Vitals/Pain Today's Vitals   02/04/22 1130 02/04/22 1145 02/04/22 1303 02/04/22 1458  BP:  127/70    Pulse: (!) 53 63    Resp:  16    Temp:  98.2 F (36.8 C)    TempSrc:  Oral    SpO2: 96% 96%    Weight:      Height:      PainSc:   6  6     Isolation Precautions No active isolations  Medications Medications  enoxaparin  (LOVENOX) injection 40 mg (40 mg Subcutaneous Given 02/09/2022 2251)  ceFEPIme (MAXIPIME) 2 g in sodium chloride 0.9 % 100 mL IVPB (0 g Intravenous Stopped 02/04/22 0846)  prochlorperazine (COMPAZINE) injection 5 mg (5 mg Intravenous Given 02/04/22 0141)  metoprolol tartrate (LOPRESSOR) injection 5 mg (has no administration in time range)  insulin aspart (novoLOG) injection 0-9 Units ( Subcutaneous Not Given 02/04/22 1459)  guaiFENesin (ROBITUSSIN) 100 MG/5ML liquid 5 mL (has no administration in time range)  ipratropium-albuterol (DUONEB) 0.5-2.5 (3) MG/3ML nebulizer solution 3 mL (3 mLs Nebulization Patient Refused/Not Given 02/04/22 1500)  0.9 %  sodium chloride infusion ( Intravenous New Bag/Given 02/04/22 0141)  aspirin EC tablet 81 mg (has no administration in time range)  carvedilol (COREG) tablet 6.25 mg (has no administration in time range)  ipratropium-albuterol (DUONEB) 0.5-2.5 (3) MG/3ML nebulizer solution 3 mL (has no administration in time range)  sertraline (ZOLOFT) tablet 25 mg (has no administration in time range)  tiotropium (SPIRIVA) inhalation capsule ([facility] use ONLY) 18 mcg (18 mcg Inhalation Not Given 02/04/22 1044)  pantoprazole (PROTONIX) injection 40 mg (40 mg Intravenous Given 02/04/22 1007)  HYDROmorphone (DILAUDID) injection 0.5 mg (0.5 mg Intravenous Given 02/04/22 1407)  diatrizoate meglumine-sodium (GASTROGRAFIN) 66-10 % solution 90 mL (has no administration in time range)  morphine (PF) 4 MG/ML injection 4 mg (4 mg Intravenous Given 01/27/2022 1606)  vancomycin (VANCOCIN) IVPB 1000 mg/200 mL premix (  0 mg Intravenous Stopped 02/04/2022 2037)  ceFEPIme (MAXIPIME) 2 g in sodium chloride 0.9 % 100 mL IVPB (0 g Intravenous Stopped 02/15/2022 1636)  pantoprazole (PROTONIX) injection 40 mg (40 mg Intravenous Given 01/28/2022 1844)  iohexol (OMNIPAQUE) 350 MG/ML injection 100 mL (100 mLs Intravenous Contrast Given 02/12/2022 1900)  ondansetron (ZOFRAN) injection 4 mg (4 mg Intravenous  Given 02/06/2022 2038)    Mobility  Moderate fall risk   Focused Assessments    R Recommendations: See Admitting Provider Note  Report given to:   Additional Notes:

## 2022-02-04 NOTE — Progress Notes (Signed)
Pt said " You better not wake me up at 2 am, I need to sleep". I don't take treatment early mornings. Assessment done and changed frequency of txs.

## 2022-02-05 ENCOUNTER — Inpatient Hospital Stay (HOSPITAL_COMMUNITY): Payer: Medicare (Managed Care)

## 2022-02-05 ENCOUNTER — Inpatient Hospital Stay: Payer: Self-pay

## 2022-02-05 DIAGNOSIS — J189 Pneumonia, unspecified organism: Secondary | ICD-10-CM | POA: Diagnosis not present

## 2022-02-05 LAB — BASIC METABOLIC PANEL
Anion gap: 10 (ref 5–15)
BUN: 22 mg/dL (ref 8–23)
CO2: 22 mmol/L (ref 22–32)
Calcium: 9.3 mg/dL (ref 8.9–10.3)
Chloride: 106 mmol/L (ref 98–111)
Creatinine, Ser: 0.7 mg/dL (ref 0.44–1.00)
GFR, Estimated: >60 mL/min (ref >60)
Glucose, Bld: 112 mg/dL — ABNORMAL HIGH (ref 70–99)
Potassium: 3.6 mmol/L (ref 3.5–5.1)
Sodium: 138 mmol/L (ref 135–145)

## 2022-02-05 LAB — CBC
HCT: 37.5 % (ref 36.0–46.0)
Hemoglobin: 11.8 g/dL — ABNORMAL LOW (ref 12.0–15.0)
MCH: 31.1 pg (ref 26.0–34.0)
MCHC: 31.5 g/dL (ref 30.0–36.0)
MCV: 98.9 fL (ref 80.0–100.0)
Platelets: 501 10*3/uL — ABNORMAL HIGH (ref 150–400)
RBC: 3.79 MIL/uL — ABNORMAL LOW (ref 3.87–5.11)
RDW: 13.9 % (ref 11.5–15.5)
WBC: 8.2 10*3/uL (ref 4.0–10.5)
nRBC: 0 % (ref 0.0–0.2)

## 2022-02-05 LAB — GLUCOSE, CAPILLARY
Glucose-Capillary: 105 mg/dL — ABNORMAL HIGH (ref 70–99)
Glucose-Capillary: 105 mg/dL — ABNORMAL HIGH (ref 70–99)
Glucose-Capillary: 108 mg/dL — ABNORMAL HIGH (ref 70–99)
Glucose-Capillary: 112 mg/dL — ABNORMAL HIGH (ref 70–99)
Glucose-Capillary: 122 mg/dL — ABNORMAL HIGH (ref 70–99)
Glucose-Capillary: 99 mg/dL (ref 70–99)

## 2022-02-05 LAB — BRAIN NATRIURETIC PEPTIDE: B Natriuretic Peptide: 669 pg/mL — ABNORMAL HIGH (ref 0.0–100.0)

## 2022-02-05 MED ORDER — METOPROLOL TARTRATE 5 MG/5ML IV SOLN
2.5000 mg | Freq: Three times a day (TID) | INTRAVENOUS | Status: DC
  Administered 2022-02-05 – 2022-02-06 (×5): 2.5 mg via INTRAVENOUS
  Filled 2022-02-05 (×5): qty 5

## 2022-02-05 MED ORDER — SODIUM CHLORIDE 0.9 % IV SOLN: INTRAVENOUS | Status: DC

## 2022-02-05 MED ORDER — CHLORHEXIDINE GLUCONATE CLOTH 2 % EX PADS
6.0000 | MEDICATED_PAD | Freq: Every day | CUTANEOUS | Status: DC
  Administered 2022-02-05 – 2022-02-13 (×9): 6 via TOPICAL

## 2022-02-05 MED ORDER — SODIUM CHLORIDE 0.9% FLUSH: 10.0000 mL | INTRAVENOUS | Status: DC | PRN

## 2022-02-05 MED ORDER — SODIUM CHLORIDE 0.9% FLUSH
10.0000 mL | Freq: Two times a day (BID) | INTRAVENOUS | Status: DC
  Administered 2022-02-05 – 2022-02-13 (×9): 10 mL

## 2022-02-05 NOTE — Progress Notes (Signed)
Son, Ron, updated via phone call.

## 2022-02-05 NOTE — Progress Notes (Signed)
Patient with incontinent medium size, type 1, brown stool.   Gastrografin given orally per surgery and pharmacy at 1720. X-ray of abd to be obtained around 0130 02/06/22. Will report off to night shift RN.

## 2022-02-05 NOTE — Progress Notes (Signed)
PROGRESS NOTE [name]  AQT:[id number] DOB: 1932/07/07 DOA: 01/17/2022 PCP: Berniece Salines, DO   Brief Narrative/Hospital Course: 88 year old female with P atrial flutter not on anticoagulation, hypertension recent back surgery, on chronic high-dose morphine presented with shortness of breath persistent diffuse abdominal pain nausea vomiting and constipation x3 days PTA Seen in the [facility] ED 10/16-diagnosed with pneumonia/pleural effusion discharged back to facility on antibiotics. Due to persistent similar pain presented to [facility] ED 10/19- w/ pleuritic pain worse with deep breath chest x-ray worsening pneumonia. CTA negative for PE revealed persistent left lower lobe tree-in-bud nodularity suggestive of infection/inflammation, trace bilateral pleural effusion, large hiatal hernia, neuropathy small bowel obstruction query transition point in the mid abdomen.  Pneumonia, CT abdomen pelvis with contrast revealed partial small bowel obstruction. surgery was consulted and patient was admitted. Labs showed BNP 438.  Serum sodium 133, glucose 145, BUN 35, creatinine 0.83.  Subjective: Seen and examined. Overnight afebrile patient complaining of ongoing pain Labs this morning showed improved WBC count, stable renal function Refused NGT multiple times She takes morphine twice a day- 100 mg for her back, says she has not walked In few years on WC   Assessment and Plan: Principal Problem:   Pneumonia  Community-acquired pneumonia: Recently discharged on Augmentin and azithromycin.  Currently vitals are stable afebrile, leukocytosis resolved.  MRSA screen negative discontinue vancomycin.  Continue pulmonary support incentive spirometry ambulation as tolerated Recent Labs  Lab 01/31/22 0809 01/31/22 1055 01/31/22 1231 01/22/2022 1634 02/07/2022 1636 02/06/2022 2218 02/04/22 0500 02/05/22 0550  WBC 19.1*  --   --  12.4*  --  12.0* 15.7* 8.2  LATICACIDVEN  --  2.0* 1.8  --  1.1  --   --   --   Partial  small bowel obstruction: "CT abdomen in the ED, general surgeon consulted, patient denies any nausea or vomiting, reports she had bowel movements x2.  X-ray abdomen with diffuse mild to moderate small bowel dilation up to 4.1 cm not appreciably changed from CT. surgery following continue plan of care NGT/NPO per surgery-refused NGT multiple times-discussed with nursing staff hold as patient Dr. Marlou Starks with her NGT was needed and not, small bowel protocol pending.  Continue gentle IV fluid hydration.  Will need PICC line  Hypovolemic hyponatremia: Mild, resolved Recent Labs  Lab 01/31/22 0809 01/22/2022 1634 01/23/2022 1635 02/04/22 0500 02/05/22 0550  NA 139 133* 133* 136 138   Chronic pain/back pain Recent back surgery wheelchair-bound Chronic opiate use: She is on morphine sulfate 100 mg bid.cont IV pain medication hopefully can take p.o once SBO resolves continue IV Dilaudid for now.  PAF rate controlled not on anticoagulation.  On Coreg> changed to IV Lopressor.  Monitoring telemetry Chronic diastolic CHF currently volume status stable BNP 430s> repeat pending, on gentle IVF.  Watch for fluid overload.  Elevated troponin 47-40,: ?  Etiology, repeat flat suspect demand ischemia due to acute medical illness Goals of care: Cont full code.   DVT prophylaxis: enoxaparin (LOVENOX) injection 40 mg Start: 02/06/2022 2200 Code Status:   Code Status: Full Code Family Communication: plan of care discussed with patient at bedside.  Patient status is: Inpatient because of pneumonia and abdominal pain bowel obstruction management  Level of care: Progressive  Dispo: The patient is from: Skilled nursing facility            Anticipated disposition: snf  Mobility Assessment (last 72 hours)     Mobility Assessment     Row Name 02/04/22 1700 02/04/22  10:09:41         Does patient have an order for bedrest or is patient medically unstable No - Continue assessment No - Continue assessment      What  is the highest level of mobility based on the progressive mobility assessment? Level 2 (Chairfast) - Balance while sitting on edge of bed and cannot stand --      Is the above level different from baseline mobility prior to current illness? No - Consider discontinuing PT/OT --                Objective: Vitals last 24 hrs: Vitals:   02/05/22 0425 02/05/22 0427 02/05/22 0438 02/05/22 1045  BP: (!) 155/74  (!) 155/74 (!) 148/56  Pulse: 64  (!) 106 (!) 102  Resp: 18  18 (!) 22  Temp: 97.7 F (36.5 C)  97.7 F (36.5 C) 98.6 F (37 C)  TempSrc: Oral  Oral Oral  SpO2: 97%  98% 96%  Weight:  51.7 kg    Height:       Weight change: -1.824 kg  Physical Examination: General exam: alert awake,  older than stated age HEENT:Oral mucosa moist, Ear/Nose WNL grossly Respiratory system: Bilaterally clear BS, no use of accessory muscle Cardiovascular system: S1 & S2 +, No JVD. Gastrointestinal system: Abdomen soft, generalized tenderness present, bowel sounds sluggish Nervous System: Alert, awake, moving extremities, she follows commands. Extremities: LE edema neg,distal peripheral pulses palpable.  Skin: No rashes,no icterus. MSK: Normal muscle bulk,tone, power   Medications reviewed:  Scheduled Meds:  aspirin EC  81 mg Oral Daily   diatrizoate meglumine-sodium  90 mL Per NG tube Once   enoxaparin (LOVENOX) injection  40 mg Subcutaneous Q24H   insulin aspart  0-9 Units Subcutaneous Q4H   ipratropium-albuterol  3 mL Nebulization TID   metoprolol tartrate  2.5 mg Intravenous Q8H   pantoprazole (PROTONIX) IV  40 mg Intravenous Q24H   sertraline  25 mg Oral Daily   umeclidinium bromide  1 puff Inhalation Daily  Continuous Infusions:  ceFEPime (MAXIPIME) IV 2 g (02/05/22 0427)    Diet Order             Diet NPO time specified  Diet effective now                     Intake/Output Summary (Last 24 hours) at 02/05/2022 1112 Last data filed at 02/05/2022 0200 Gross per 24 hour   Intake 361.58 ml  Output --  Net 361.58 ml   Net IO Since Admission: 461.58 mL [02/05/22 1112]  Wt Readings from Last 3 Encounters:  02/05/22 51.7 kg  01/31/22 53.5 kg  11/22/21 52.2 kg     Unresulted Labs (From admission, onward)     Start     Ordered   02/10/22 0500  Creatinine, serum  (enoxaparin (LOVENOX)    CrCl >/= 30 ml/min)  Weekly,   R     Comments: while on enoxaparin therapy    01/29/2022 2108   02/05/22 7035  Basic metabolic panel  Daily at 5am,   R      02/04/22 0746   02/05/22 0500  CBC  Daily at 5am,   R      02/04/22 0746   02/05/22 0500  Brain natriuretic peptide  Tomorrow morning,   R        02/04/22 1204          Data Reviewed: I have personally reviewed following labs  and imaging studies CBC: Recent Labs  Lab 01/31/22 0809 01/16/2022 1634 02/10/2022 1635 02/04/2022 2218 02/04/22 0500 02/05/22 0550  WBC 19.1* 12.4*  --  12.0* 15.7* 8.2  NEUTROABS  --  11.6*  --   --  13.8*  --   HGB 13.3 12.2 13.6 11.5* 12.0 11.8*  HCT 42.0 38.4 40.0 36.0 37.9 37.5  MCV 100.0 98.5  --  97.6 98.4 98.9  PLT 552* 406*  --  410* 492* 397*   Basic Metabolic Panel: Recent Labs  Lab 01/31/22 0809 01/24/2022 1634 01/31/2022 1635 02/05/2022 2218 02/04/22 0500 02/05/22 0550  NA 139 133* 133*  --  136 138  K 3.8 4.3 4.3  --  3.9 3.6  CL 103 100 99  --  103 106  CO2 25 23  --   --  23 22  GLUCOSE 149* 149* 145*  --  119* 112*  BUN 20 36* 35*  --  32* 22  CREATININE 0.82 1.00 0.90 0.83 0.90 0.70  CALCIUM 9.8 9.4  --   --  9.2 9.3  MG  --   --   --   --  2.1  --   PHOS  --   --   --   --  2.7  --    GFR: Estimated Creatinine Clearance: 40.4 mL/min (by C-G formula based on SCr of 0.7 mg/dL). Liver Function Tests: Recent Labs  Lab 01/31/22 0809 01/23/2022 1634  AST 19 19  ALT 9 10  ALKPHOS 57 53  BILITOT 0.3 0.9  PROT 7.4 7.4  ALBUMIN 3.8 3.3*   Recent Labs  Lab 01/31/22 0809 01/24/2022 1635  LIPASE 25 24   No results for input(s): "AMMONIA" in the last 168  hours. Coagulation Profile: No results for input(s): "INR", "PROTIME" in the last 168 hours. BNP (last 3 results) No results for input(s): "PROBNP" in the last 8760 hours. HbA1C: Recent Labs    02/04/22 0500  HGBA1C 5.6   CBG: Recent Labs  Lab 02/04/22 2010 02/05/22 0013 02/05/22 0423 02/05/22 0754 02/05/22 1106  GLUCAP 133* 105* 108* 105* 112*   Lipid Profile: No results for input(s): "CHOL", "HDL", "LDLCALC", "TRIG", "CHOLHDL", "LDLDIRECT" in the last 72 hours. Thyroid Function Tests: No results for input(s): "TSH", "T4TOTAL", "FREET4", "T3FREE", "THYROIDAB" in the last 72 hours. Sepsis Labs: Recent Labs  Lab 01/31/22 1055 01/31/22 1231 01/22/2022 1636  LATICACIDVEN 2.0* 1.8 1.1    Recent Results (from the past 240 hour(s))  SARS Coronavirus 2 by RT PCR (hospital order, performed in [facility] hospital lab) *cepheid single result test* Anterior Nasal Swab     Status: None   Collection Time: 02/10/2022  2:25 PM   Specimen: Anterior Nasal Swab  Result Value Ref Range Status   SARS Coronavirus 2 by RT PCR NEGATIVE NEGATIVE Final    Comment: (NOTE) SARS-CoV-2 target nucleic acids are NOT DETECTED.  The SARS-CoV-2 RNA is generally detectable in upper and lower respiratory specimens during the acute phase of infection. The lowest concentration of SARS-CoV-2 viral copies this assay can detect is 250 copies / mL. A negative result does not preclude SARS-CoV-2 infection and should not be used as the sole basis for treatment or other patient management decisions.  A negative result may occur with improper specimen collection / handling, submission of specimen other than nasopharyngeal swab, presence of viral mutation(s) within the areas targeted by this assay, and inadequate number of viral copies (<250 copies / mL). A negative result must be  combined with clinical observations, patient history, and epidemiological information.  Fact Sheet for Patients:    https://www.patel.info/  Fact Sheet for Healthcare Providers: https://hall.com/  This test is not yet approved or  cleared by the [place] FDA and has been authorized for detection and/or diagnosis of SARS-CoV-2 by FDA under an Emergency Use Authorization (EUA).  This EUA will remain in effect (meaning this test can be used) for the duration of the COVID-19 declaration under Section 564(b)(1) of the Act, 21 U.S.C. section 360bbb-3(b)(1), unless the authorization is terminated or revoked sooner.  Performed at [facility], [place] [address] [place], [place] [address]   Blood culture (routine x 2)     Status: None (Preliminary result)   Collection Time: 01/19/2022  4:00 PM   Specimen: BLOOD  Result Value Ref Range Status   Specimen Description   Final    BLOOD BLOOD RIGHT FOREARM Performed at [place] [address] [place], [place] [address]    Special Requests   Final    BOTTLES DRAWN AEROBIC AND ANAEROBIC Blood Culture results may not be optimal due to an excessive volume of blood received in culture bottles Performed at [place] [address], [place], [place] [address]    Culture   Final    NO GROWTH 2 DAYS Performed at [place] [address] [place], [place] [address]    Report Status PENDING  Incomplete  Blood culture (routine x 2)     Status: None (Preliminary result)   Collection Time: 01/26/2022  4:00 PM   Specimen: BLOOD  Result Value Ref Range Status   Specimen Description   Final    BLOOD LEFT ANTECUBITAL Performed at [place] [address], [place], [place] [address]    Special Requests   Final    BOTTLES DRAWN AEROBIC AND ANAEROBIC Blood Culture adequate volume Performed at [place] [address] [place], [place] [address]    Culture   Final    NO GROWTH 2 DAYS Performed at [facility] [address] [place], [place] [address]    Report Status PENDING  Incomplete  MRSA Next Gen by PCR, Nasal     Status: None   Collection Time: 02/07/2022  4:37 PM   Specimen: Nasal Swab  Result Value Ref Range Status   MRSA by PCR Next Gen NOT DETECTED NOT DETECTED Final    Comment: (NOTE) The GeneXpert MRSA Assay (FDA approved for NASAL specimens only), is one component of a comprehensive MRSA colonization surveillance program. It is not intended to diagnose MRSA infection nor to guide or monitor treatment for MRSA infections. Test performance is not FDA approved in patients less than 35 years old. Performed at [facility], [place] [address] [place], [place] [address]     Antimicrobials: Anti-infectives (From admission, onward)    Start     Dose/Rate Route Frequency Ordered Stop   02/04/22 0600  ceFEPIme (MAXIPIME) 2 g in sodium chloride 0.9 % 100 mL IVPB  Status:  Discontinued        2 g 200 mL/hr over 30 Minutes Intravenous Every 12 hours 01/18/2022 1534 02/10/2022 1541   02/04/22 0400  ceFEPIme (MAXIPIME) 2 g in sodium chloride 0.9 % 100 mL IVPB        2 g 200 mL/hr over 30 Minutes Intravenous Every 12 hours 02/06/2022 2226     01/21/2022 1545  ceFEPIme (MAXIPIME) 2 g in sodium chloride 0.9 % 100 mL IVPB        2 g 200 mL/hr over 30 Minutes Intravenous  Once 02/06/2022 1528 02/06/2022 1636   02/02/2022 1515  vancomycin (VANCOCIN) IVPB 1000 mg/200 mL premix        1,000 mg 200 mL/hr over 60 Minutes Intravenous  Once 01/16/2022 1510 01/25/2022 2037      Culture/Microbiology    Component Value Date/Time   SDES  01/30/2022 1600    BLOOD BLOOD RIGHT FOREARM Performed at [facility], [place] [address] [place], [place] [address]    SDES  02/05/2022 1600    BLOOD LEFT ANTECUBITAL Performed at [facility], [place] [address] [place], [place] [address]    [place]  01/19/2022 1600    BOTTLES DRAWN AEROBIC AND ANAEROBIC Blood  Culture results may not be optimal due to an excessive volume of blood received in culture bottles Performed at [facility], [place] [address], [place], [place] [address]    SPECREQUEST  02/05/2022 1600    BOTTLES DRAWN AEROBIC AND ANAEROBIC Blood Culture adequate volume Performed at [place] [address] [place], [place] [address]    CULT  01/22/2022 1600    NO GROWTH 2 DAYS Performed at [place] [address] [place], [place] [address]    CULT  01/19/2022 1600    NO GROWTH 2 DAYS Performed at [place] [address], [place],  [address]    REPTSTATUS PENDING 02/07/2022 1600   REPTSTATUS PENDING 02/09/2022 1600    Other culture-see note  Radiology Studies: [place] EKG SITE RITE  Result Date: 02/05/2022 If Site Rite image not attached, placement could not be confirmed due to current cardiac rhythm.  DG Abd Portable 1V  Result Date: 02/04/2022 CLINICAL DATA:  [id number] SBO (small bowel obstruction) ([place]) [id number], abdominal pain, nausea, vomiting and abdominal distension EXAM: PORTABLE ABDOMEN - 1 VIEW COMPARISON:  09/22/2010 abdominal radiograph, 01/22/2022 CT abdomen/pelvis FINDINGS: Diffuse mild-to-moderate small bowel dilatation up to 4.1 cm diameter, not appreciably changed from CT. Excreted contrast noted in the mildly distended bladder. No evidence of pneumatosis or pneumoperitoneum. Bilateral posterior lumbar spinal fusion hardware. IMPRESSION: No appreciable change in diffuse mild-to-moderate small bowel dilatation, compatible with distal small bowel obstruction. Electronically Signed   By: Ilona Sorrel M.D.   On: 02/04/2022 09:00   CT Angio Chest PE W and/or Wo Contrast  Result Date: 01/26/2022 CLINICAL DATA:  Pulmonary embolism (PE) suspected, high prob; Abdominal pain, acute, nonlocalized. Pt reports she was diagnosed with pneumonia and given abx Monday and had a repeat xray yesterday that showed worsening  pneumonia. Per EMS crackles heard in all lung areas. Pt c/o substernal pain on inspiration. EXAM: CT ANGIOGRAPHY CHEST CT ABDOMEN AND PELVIS WITH CONTRAST TECHNIQUE: Multidetector CT imaging of the chest was performed using the standard protocol during bolus administration of intravenous contrast. Multiplanar CT image reconstructions and MIPs were obtained to evaluate the vascular anatomy. Multidetector CT imaging of the abdomen and pelvis was performed using the standard protocol during bolus administration of intravenous contrast. RADIATION DOSE REDUCTION: This exam was performed according to the departmental dose-optimization program which includes automated exposure control, adjustment of the mA and/or kV according to patient size and/or use of iterative reconstruction technique. CONTRAST:  133m OMNIPAQUE IOHEXOL 350 MG/ML SOLN COMPARISON:  CT abdomen pelvis 01/31/2022 FINDINGS: CTA CHEST FINDINGS Cardiovascular: Satisfactory opacification of the pulmonary arteries to  the segmental level. No evidence of pulmonary embolism. Normal heart size. No significant pericardial effusion. The thoracic aorta is normal in caliber. Mild-to-moderate atherosclerotic plaque of the thoracic aorta. At least 3 vessel coronary artery calcifications. Mediastinum/Nodes: No enlarged mediastinal, hilar, or axillary lymph nodes. Thyroid gland, trachea, and esophagus demonstrate no significant findings. Large hiatal hernia. Lungs/Pleura: Right lower lobe passive atelectasis. Persistent left lower lobe tree-in-bud nodularity. No focal consolidation. No pulmonary nodule. No pulmonary mass. Trace right pleural effusion. Trace left pleural effusion. No pneumothorax. Musculoskeletal: Bilateral breast implants. No suspicious lytic or blastic osseous lesions. No acute displaced fracture. Multilevel degenerative changes of the spine. Chronic T10 anterior wedge compression fracture. Bilateral shoulder degenerative changes. Review of the MIP  images confirms the above findings. CT ABDOMEN and PELVIS FINDINGS Hepatobiliary: No focal liver abnormality. Question layering density within the gallbladder lumen suggestive of fat carious excretion of previously administered intravenous contrast. No gallstones, gallbladder wall thickening, or pericholecystic fluid. No biliary dilatation. Pancreas: No focal lesion. Normal pancreatic contour. No surrounding inflammatory changes. No main pancreatic ductal dilatation. Spleen: Normal in size without focal abnormality. Adrenals/Urinary Tract: No adrenal nodule bilaterally. Bilateral kidneys enhance symmetrically. Subcentimeter hypodensities are too small to characterize. No hydronephrosis. No hydroureter. The urinary bladder is unremarkable. On delayed imaging, there is no urothelial wall thickening and there are no filling defects in the opacified portions of the bilateral collecting systems or ureters. Stomach/Bowel: Stomach is within normal limits. Multiple loops of small bowel demonstrate air-fluid levels on are dilated up to 3 cm. Question developing transition point within the mid abdomen (2:50, 5:48). No evidence of large bowel wall thickening or dilatation. Colonic diverticulosis. Appendix appears normal. Vascular/Lymphatic: No abdominal aorta or iliac aneurysm. Moderate atherosclerotic plaque of the aorta and its branches. No abdominal, pelvic, or inguinal lymphadenopathy. Reproductive: Status post hysterectomy. No adnexal masses. Other: No intraperitoneal free fluid. No intraperitoneal free gas. No organized fluid collection. Musculoskeletal: L1 - S1 posterolateral and interbody surgical hardware fusion. No suspicious lytic or blastic osseous lesions. No acute displaced fracture. Multilevel degenerative changes of the spine. Review of the MIP images confirms the above findings. IMPRESSION: 1. No pulmonary embolus. 2. Persistent left lower lobe tree-in-bud nodularity suggestive of infection/inflammation. 3.  Trace bilateral pleural effusions. 4. Large hiatal hernia. 5. Developing small bowel obstruction with query transition point within the mid abdomen. Small bowel measures up to 3 cm with air-fluid levels. 6. Colonic diverticulosis with no acute diverticulitis. 7.  Aortic Atherosclerosis (ICD10-I70.0). Electronically Signed   By: Iven Finn M.D.   On: 02/08/2022 19:30   CT ABDOMEN PELVIS W CONTRAST  Result Date: 01/28/2022 CLINICAL DATA:  Pulmonary embolism (PE) suspected, high prob; Abdominal pain, acute, nonlocalized. Pt reports she was diagnosed with pneumonia and given abx Monday and had a repeat xray yesterday that showed worsening pneumonia. Per EMS crackles heard in all lung areas. Pt c/o substernal pain on inspiration. EXAM: CT ANGIOGRAPHY CHEST CT ABDOMEN AND PELVIS WITH CONTRAST TECHNIQUE: Multidetector CT imaging of the chest was performed using the standard protocol during bolus administration of intravenous contrast. Multiplanar CT image reconstructions and MIPs were obtained to evaluate the vascular anatomy. Multidetector CT imaging of the abdomen and pelvis was performed using the standard protocol during bolus administration of intravenous contrast. RADIATION DOSE REDUCTION: This exam was performed according to the departmental dose-optimization program which includes automated exposure control, adjustment of the mA and/or kV according to patient size and/or use of iterative reconstruction technique. CONTRAST:  133m OMNIPAQUE IOHEXOL 350  MG/ML SOLN COMPARISON:  CT abdomen pelvis 01/31/2022 FINDINGS: CTA CHEST FINDINGS Cardiovascular: Satisfactory opacification of the pulmonary arteries to the segmental level. No evidence of pulmonary embolism. Normal heart size. No significant pericardial effusion. The thoracic aorta is normal in caliber. Mild-to-moderate atherosclerotic plaque of the thoracic aorta. At least 3 vessel coronary artery calcifications. Mediastinum/Nodes: No enlarged  mediastinal, hilar, or axillary lymph nodes. Thyroid gland, trachea, and esophagus demonstrate no significant findings. Large hiatal hernia. Lungs/Pleura: Right lower lobe passive atelectasis. Persistent left lower lobe tree-in-bud nodularity. No focal consolidation. No pulmonary nodule. No pulmonary mass. Trace right pleural effusion. Trace left pleural effusion. No pneumothorax. Musculoskeletal: Bilateral breast implants. No suspicious lytic or blastic osseous lesions. No acute displaced fracture. Multilevel degenerative changes of the spine. Chronic T10 anterior wedge compression fracture. Bilateral shoulder degenerative changes. Review of the MIP images confirms the above findings. CT ABDOMEN and PELVIS FINDINGS Hepatobiliary: No focal liver abnormality. Question layering density within the gallbladder lumen suggestive of fat carious excretion of previously administered intravenous contrast. No gallstones, gallbladder wall thickening, or pericholecystic fluid. No biliary dilatation. Pancreas: No focal lesion. Normal pancreatic contour. No surrounding inflammatory changes. No main pancreatic ductal dilatation. Spleen: Normal in size without focal abnormality. Adrenals/Urinary Tract: No adrenal nodule bilaterally. Bilateral kidneys enhance symmetrically. Subcentimeter hypodensities are too small to characterize. No hydronephrosis. No hydroureter. The urinary bladder is unremarkable. On delayed imaging, there is no urothelial wall thickening and there are no filling defects in the opacified portions of the bilateral collecting systems or ureters. Stomach/Bowel: Stomach is within normal limits. Multiple loops of small bowel demonstrate air-fluid levels on are dilated up to 3 cm. Question developing transition point within the mid abdomen (2:50, 5:48). No evidence of large bowel wall thickening or dilatation. Colonic diverticulosis. Appendix appears normal. Vascular/Lymphatic: No abdominal aorta or iliac aneurysm.  Moderate atherosclerotic plaque of the aorta and its branches. No abdominal, pelvic, or inguinal lymphadenopathy. Reproductive: Status post hysterectomy. No adnexal masses. Other: No intraperitoneal free fluid. No intraperitoneal free gas. No organized fluid collection. Musculoskeletal: L1 - S1 posterolateral and interbody surgical hardware fusion. No suspicious lytic or blastic osseous lesions. No acute displaced fracture. Multilevel degenerative changes of the spine. Review of the MIP images confirms the above findings. IMPRESSION: 1. No pulmonary embolus. 2. Persistent left lower lobe tree-in-bud nodularity suggestive of infection/inflammation. 3. Trace bilateral pleural effusions. 4. Large hiatal hernia. 5. Developing small bowel obstruction with query transition point within the mid abdomen. Small bowel measures up to 3 cm with air-fluid levels. 6. Colonic diverticulosis with no acute diverticulitis. 7.  Aortic Atherosclerosis (ICD10-I70.0). Electronically Signed   By: Iven Finn M.D.   On: 01/23/2022 19:30   DG Chest 2 View  Result Date: 02/10/2022 CLINICAL DATA:  Cough. Patient reports being diagnosed with pneumonia and given antibiotics Monday. Substernal chest pain with inspiration. EXAM: CHEST - 2 VIEW COMPARISON:  Radiographs 01/31/2022 and 10/28/2021. Chest CT 10/24/2021. Abdominal CT 01/31/2022. FINDINGS: Persistent low lung volumes with chronic elevation of the right hemidiaphragm. The heart size and mediastinal contours are stable. There is a large hiatal hernia. There are increased patchy airspace opacities at both lung bases with a possible small right pleural effusion. No evidence of pneumothorax. Postsurgical changes are present within the lumbar spine, incompletely visualized. Telemetry leads overlie the chest. IMPRESSION: 1. Increased patchy bibasilar airspace opacities with possible small right pleural effusion, suspicious for pneumonia. 2. Chronic elevation of the right hemidiaphragm  and large hiatal hernia. Electronically Signed   By:  Richardean Sale M.D.   On: 01/26/2022 14:49     LOS: 2 days   Antonieta Pert, MD [facility]  02/05/2022, 11:12 AM

## 2022-02-05 NOTE — Progress Notes (Signed)
Peripherally Inserted Central Catheter Placement  The IV Nurse has discussed with the patient and/or persons authorized to consent for the patient, the purpose of this procedure and the potential benefits and risks involved with this procedure.  The benefits include less needle sticks, lab draws from the catheter, and the patient may be discharged home with the catheter. Risks include, but not limited to, infection, bleeding, blood clot (thrombus formation), and puncture of an artery; nerve damage and irregular heartbeat and possibility to perform a PICC exchange if needed/ordered by physician.  Alternatives to this procedure were also discussed.  Bard Power PICC patient education guide, fact sheet on infection prevention and patient information card has been provided to patient /or left at bedside.    PICC Placement Documentation  PICC Double Lumen 95/63/87 Right Basilic 34 cm 0 cm (Active)  Indication for Insertion or Continuance of Line Limited venous access - need for IV therapy >5 days (PICC only) 02/05/22 1627  Exposed Catheter (cm) 0 cm 02/05/22 1627  Site Assessment Clean, Dry, Intact 02/05/22 1627  Lumen #1 Status Saline locked;Flushed;Blood return noted 02/05/22 1627  Lumen #2 Status Saline locked;Flushed;Blood return noted 02/05/22 1627  Dressing Type Transparent;Securing device 02/05/22 1627  Dressing Status Antimicrobial disc in place;Clean, Dry, Intact 02/05/22 1627  Safety Lock Not Applicable 56/43/32 9518  Line Care Connections checked and tightened 02/05/22 1627  Line Adjustment (NICU/IV Team Only) No 02/05/22 1627  Dressing Intervention New dressing 02/05/22 1627  Dressing Change Due 02/12/22 02/05/22 1627       Rosalio Macadamia Chenice 02/05/2022, 4:29 PM

## 2022-02-05 NOTE — Plan of Care (Signed)
  Problem: Activity: Goal: Risk for activity intolerance will decrease Outcome: Progressing   Problem: Pain Managment: Goal: General experience of comfort will improve Outcome: Progressing   

## 2022-02-05 NOTE — Progress Notes (Signed)
Pt without NG tube. Requesting at this time not to have it placed unless absolutely indicated. Surgery team and MD Kc made aware.   Per surgery, OK to not place as long as pt is able to drink the oral contrast for the SBO protocol Abd x-ray.   Contrast given orally at 1720 without issue.

## 2022-02-05 NOTE — Progress Notes (Signed)
Subjective/Chief Complaint: Complains of abd pain. Did have a bm   Objective: Vital signs in last 24 hours: Temp:  [97.7 F (36.5 C)-98.5 F (36.9 C)] 97.7 F (36.5 C) (10/21 0438) Pulse Rate:  [53-106] 106 (10/21 0438) Resp:  [15-20] 18 (10/21 0438) BP: (127-158)/(70-91) 155/74 (10/21 0438) SpO2:  [95 %-98 %] 98 % (10/21 0438) Weight:  [51.7 kg] 51.7 kg (10/21 0427) Last BM Date : 02/04/22  Intake/Output from previous day: 10/20 0701 - 10/21 0700 In: 461.6 [I.V.:261.6; IV Piggyback:200] Out: -  Intake/Output this shift: No intake/output data recorded.  General appearance: alert and cooperative Resp: clear to auscultation bilaterally Cardio: regular rate and rhythm GI: soft, mild distension. Good bs. Mild to mod tenderness  Lab Results:  Recent Labs    02/04/22 0500 02/05/22 0550  WBC 15.7* 8.2  HGB 12.0 11.8*  HCT 37.9 37.5  PLT 492* 501*   BMET Recent Labs    02/04/22 0500 02/05/22 0550  NA 136 138  K 3.9 3.6  CL 103 106  CO2 23 22  GLUCOSE 119* 112*  BUN 32* 22  CREATININE 0.90 0.70  CALCIUM 9.2 9.3   PT/INR No results for input(s): "LABPROT", "INR" in the last 72 hours. ABG Recent Labs    02/08/2022 1634  HCO3 25.4    Studies/Results: [place] EKG SITE RITE  Result Date: 02/05/2022 If Site Rite image not attached, placement could not be confirmed due to current cardiac rhythm.  DG Abd Portable 1V  Result Date: 02/04/2022 CLINICAL DATA:  [id number] SBO (small bowel obstruction) ([place]) [id number], abdominal pain, nausea, vomiting and abdominal distension EXAM: PORTABLE ABDOMEN - 1 VIEW COMPARISON:  09/22/2010 abdominal radiograph, 01/20/2022 CT abdomen/pelvis FINDINGS: Diffuse mild-to-moderate small bowel dilatation up to 4.1 cm diameter, not appreciably changed from CT. Excreted contrast noted in the mildly distended bladder. No evidence of pneumatosis or pneumoperitoneum. Bilateral posterior lumbar spinal fusion hardware. IMPRESSION: No appreciable  change in diffuse mild-to-moderate small bowel dilatation, compatible with distal small bowel obstruction. Electronically Signed   By: Ilona Sorrel M.D.   On: 02/04/2022 09:00   CT Angio Chest PE W and/or Wo Contrast  Result Date: 01/29/2022 CLINICAL DATA:  Pulmonary embolism (PE) suspected, high prob; Abdominal pain, acute, nonlocalized. Pt reports she was diagnosed with pneumonia and given abx Monday and had a repeat xray yesterday that showed worsening pneumonia. Per EMS crackles heard in all lung areas. Pt c/o substernal pain on inspiration. EXAM: CT ANGIOGRAPHY CHEST CT ABDOMEN AND PELVIS WITH CONTRAST TECHNIQUE: Multidetector CT imaging of the chest was performed using the standard protocol during bolus administration of intravenous contrast. Multiplanar CT image reconstructions and MIPs were obtained to evaluate the vascular anatomy. Multidetector CT imaging of the abdomen and pelvis was performed using the standard protocol during bolus administration of intravenous contrast. RADIATION DOSE REDUCTION: This exam was performed according to the departmental dose-optimization program which includes automated exposure control, adjustment of the mA and/or kV according to patient size and/or use of iterative reconstruction technique. CONTRAST:  154m OMNIPAQUE IOHEXOL 350 MG/ML SOLN COMPARISON:  CT abdomen pelvis 01/31/2022 FINDINGS: CTA CHEST FINDINGS Cardiovascular: Satisfactory opacification of the pulmonary arteries to the segmental level. No evidence of pulmonary embolism. Normal heart size. No significant pericardial effusion. The thoracic aorta is normal in caliber. Mild-to-moderate atherosclerotic plaque of the thoracic aorta. At least 3 vessel coronary artery calcifications. Mediastinum/Nodes: No enlarged mediastinal, hilar, or axillary lymph nodes. Thyroid gland, trachea, and esophagus demonstrate no significant findings. Large hiatal hernia.  Lungs/Pleura: Right lower lobe passive atelectasis.  Persistent left lower lobe tree-in-bud nodularity. No focal consolidation. No pulmonary nodule. No pulmonary mass. Trace right pleural effusion. Trace left pleural effusion. No pneumothorax. Musculoskeletal: Bilateral breast implants. No suspicious lytic or blastic osseous lesions. No acute displaced fracture. Multilevel degenerative changes of the spine. Chronic T10 anterior wedge compression fracture. Bilateral shoulder degenerative changes. Review of the MIP images confirms the above findings. CT ABDOMEN and PELVIS FINDINGS Hepatobiliary: No focal liver abnormality. Question layering density within the gallbladder lumen suggestive of fat carious excretion of previously administered intravenous contrast. No gallstones, gallbladder wall thickening, or pericholecystic fluid. No biliary dilatation. Pancreas: No focal lesion. Normal pancreatic contour. No surrounding inflammatory changes. No main pancreatic ductal dilatation. Spleen: Normal in size without focal abnormality. Adrenals/Urinary Tract: No adrenal nodule bilaterally. Bilateral kidneys enhance symmetrically. Subcentimeter hypodensities are too small to characterize. No hydronephrosis. No hydroureter. The urinary bladder is unremarkable. On delayed imaging, there is no urothelial wall thickening and there are no filling defects in the opacified portions of the bilateral collecting systems or ureters. Stomach/Bowel: Stomach is within normal limits. Multiple loops of small bowel demonstrate air-fluid levels on are dilated up to 3 cm. Question developing transition point within the mid abdomen (2:50, 5:48). No evidence of large bowel wall thickening or dilatation. Colonic diverticulosis. Appendix appears normal. Vascular/Lymphatic: No abdominal aorta or iliac aneurysm. Moderate atherosclerotic plaque of the aorta and its branches. No abdominal, pelvic, or inguinal lymphadenopathy. Reproductive: Status post hysterectomy. No adnexal masses. Other: No  intraperitoneal free fluid. No intraperitoneal free gas. No organized fluid collection. Musculoskeletal: L1 - S1 posterolateral and interbody surgical hardware fusion. No suspicious lytic or blastic osseous lesions. No acute displaced fracture. Multilevel degenerative changes of the spine. Review of the MIP images confirms the above findings. IMPRESSION: 1. No pulmonary embolus. 2. Persistent left lower lobe tree-in-bud nodularity suggestive of infection/inflammation. 3. Trace bilateral pleural effusions. 4. Large hiatal hernia. 5. Developing small bowel obstruction with query transition point within the mid abdomen. Small bowel measures up to 3 cm with air-fluid levels. 6. Colonic diverticulosis with no acute diverticulitis. 7.  Aortic Atherosclerosis (ICD10-I70.0). Electronically Signed   By: Iven Finn M.D.   On: 02/05/2022 19:30   CT ABDOMEN PELVIS W CONTRAST  Result Date: 02/06/2022 CLINICAL DATA:  Pulmonary embolism (PE) suspected, high prob; Abdominal pain, acute, nonlocalized. Pt reports she was diagnosed with pneumonia and given abx Monday and had a repeat xray yesterday that showed worsening pneumonia. Per EMS crackles heard in all lung areas. Pt c/o substernal pain on inspiration. EXAM: CT ANGIOGRAPHY CHEST CT ABDOMEN AND PELVIS WITH CONTRAST TECHNIQUE: Multidetector CT imaging of the chest was performed using the standard protocol during bolus administration of intravenous contrast. Multiplanar CT image reconstructions and MIPs were obtained to evaluate the vascular anatomy. Multidetector CT imaging of the abdomen and pelvis was performed using the standard protocol during bolus administration of intravenous contrast. RADIATION DOSE REDUCTION: This exam was performed according to the departmental dose-optimization program which includes automated exposure control, adjustment of the mA and/or kV according to patient size and/or use of iterative reconstruction technique. CONTRAST:  132m  OMNIPAQUE IOHEXOL 350 MG/ML SOLN COMPARISON:  CT abdomen pelvis 01/31/2022 FINDINGS: CTA CHEST FINDINGS Cardiovascular: Satisfactory opacification of the pulmonary arteries to the segmental level. No evidence of pulmonary embolism. Normal heart size. No significant pericardial effusion. The thoracic aorta is normal in caliber. Mild-to-moderate atherosclerotic plaque of the thoracic aorta. At least 3 vessel coronary artery calcifications.  Mediastinum/Nodes: No enlarged mediastinal, hilar, or axillary lymph nodes. Thyroid gland, trachea, and esophagus demonstrate no significant findings. Large hiatal hernia. Lungs/Pleura: Right lower lobe passive atelectasis. Persistent left lower lobe tree-in-bud nodularity. No focal consolidation. No pulmonary nodule. No pulmonary mass. Trace right pleural effusion. Trace left pleural effusion. No pneumothorax. Musculoskeletal: Bilateral breast implants. No suspicious lytic or blastic osseous lesions. No acute displaced fracture. Multilevel degenerative changes of the spine. Chronic T10 anterior wedge compression fracture. Bilateral shoulder degenerative changes. Review of the MIP images confirms the above findings. CT ABDOMEN and PELVIS FINDINGS Hepatobiliary: No focal liver abnormality. Question layering density within the gallbladder lumen suggestive of fat carious excretion of previously administered intravenous contrast. No gallstones, gallbladder wall thickening, or pericholecystic fluid. No biliary dilatation. Pancreas: No focal lesion. Normal pancreatic contour. No surrounding inflammatory changes. No main pancreatic ductal dilatation. Spleen: Normal in size without focal abnormality. Adrenals/Urinary Tract: No adrenal nodule bilaterally. Bilateral kidneys enhance symmetrically. Subcentimeter hypodensities are too small to characterize. No hydronephrosis. No hydroureter. The urinary bladder is unremarkable. On delayed imaging, there is no urothelial wall thickening and there  are no filling defects in the opacified portions of the bilateral collecting systems or ureters. Stomach/Bowel: Stomach is within normal limits. Multiple loops of small bowel demonstrate air-fluid levels on are dilated up to 3 cm. Question developing transition point within the mid abdomen (2:50, 5:48). No evidence of large bowel wall thickening or dilatation. Colonic diverticulosis. Appendix appears normal. Vascular/Lymphatic: No abdominal aorta or iliac aneurysm. Moderate atherosclerotic plaque of the aorta and its branches. No abdominal, pelvic, or inguinal lymphadenopathy. Reproductive: Status post hysterectomy. No adnexal masses. Other: No intraperitoneal free fluid. No intraperitoneal free gas. No organized fluid collection. Musculoskeletal: L1 - S1 posterolateral and interbody surgical hardware fusion. No suspicious lytic or blastic osseous lesions. No acute displaced fracture. Multilevel degenerative changes of the spine. Review of the MIP images confirms the above findings. IMPRESSION: 1. No pulmonary embolus. 2. Persistent left lower lobe tree-in-bud nodularity suggestive of infection/inflammation. 3. Trace bilateral pleural effusions. 4. Large hiatal hernia. 5. Developing small bowel obstruction with query transition point within the mid abdomen. Small bowel measures up to 3 cm with air-fluid levels. 6. Colonic diverticulosis with no acute diverticulitis. 7.  Aortic Atherosclerosis (ICD10-I70.0). Electronically Signed   By: Iven Finn M.D.   On: 02/12/2022 19:30   DG Chest 2 View  Result Date: 02/10/2022 CLINICAL DATA:  Cough. Patient reports being diagnosed with pneumonia and given antibiotics Monday. Substernal chest pain with inspiration. EXAM: CHEST - 2 VIEW COMPARISON:  Radiographs 01/31/2022 and 10/28/2021. Chest CT 10/24/2021. Abdominal CT 01/31/2022. FINDINGS: Persistent low lung volumes with chronic elevation of the right hemidiaphragm. The heart size and mediastinal contours are  stable. There is a large hiatal hernia. There are increased patchy airspace opacities at both lung bases with a possible small right pleural effusion. No evidence of pneumothorax. Postsurgical changes are present within the lumbar spine, incompletely visualized. Telemetry leads overlie the chest. IMPRESSION: 1. Increased patchy bibasilar airspace opacities with possible small right pleural effusion, suspicious for pneumonia. 2. Chronic elevation of the right hemidiaphragm and large hiatal hernia. Electronically Signed   By: Richardean Sale M.D.   On: 01/29/2022 14:49    Anti-infectives: Anti-infectives (From admission, onward)    Start     Dose/Rate Route Frequency Ordered Stop   02/04/22 0600  ceFEPIme (MAXIPIME) 2 g in sodium chloride 0.9 % 100 mL IVPB  Status:  Discontinued  2 g 200 mL/hr over 30 Minutes Intravenous Every 12 hours 02/08/2022 1534 01/24/2022 1541   02/04/22 0400  ceFEPIme (MAXIPIME) 2 g in sodium chloride 0.9 % 100 mL IVPB        2 g 200 mL/hr over 30 Minutes Intravenous Every 12 hours 02/08/2022 2226     01/31/2022 1545  ceFEPIme (MAXIPIME) 2 g in sodium chloride 0.9 % 100 mL IVPB        2 g 200 mL/hr over 30 Minutes Intravenous  Once 02/15/2022 1528 01/31/2022 1636   02/04/2022 1515  vancomycin (VANCOCIN) IVPB 1000 mg/200 mL premix        1,000 mg 200 mL/hr over 60 Minutes Intravenous  Once 01/23/2022 1510 01/16/2022 2037       Assessment/Plan: s/p * No surgery found * Continue bowel rest for sbo Start small bowel protocol Will need PICC for IV access SBO - CT w/ dilated loops of small bowel with query of transition point in the mid abdomen the small bowel dilated up to 3 cm.  Plain film from this morning with small bowel dilated to 4.1 cm.  She does have history of prior open appendectomy and hysterectomy.  Suspect SBO likely from adhesions.  - No current indication for emergency surgery - Place NGT for decompression and keep NPO - Start SBO protocol - Keep K > 4 and Mg > 2  for bowel function - Mobilize for bowel function - Hopefully patient will improve with conservative management. If patient fails to improve with conservative management, they may require exploratory surgery during admission.  Appears palliative has been consulted for [place].  - Agree with medical admission. We will follow with you.   FEN -NPO, IV fluid per TRH.  Placed NG tube VTE -SCDs.  Okay for chemical prophylaxis from a general surgery standpoint ID - Cefepime for pneumonia. Foley - None Dispo - Admit to [facility]. Place NGT. SBO protocol   PAF not on anticoagulation  Hypertension  PNA  LOS: 2 days    Autumn Messing III 02/05/2022

## 2022-02-05 NOTE — Progress Notes (Signed)
CXRay result reviewed. Abby, RN notified OK to use PICC; hang new IV tubing.

## 2022-02-06 ENCOUNTER — Inpatient Hospital Stay (HOSPITAL_COMMUNITY): Payer: Medicare (Managed Care)

## 2022-02-06 DIAGNOSIS — K56609 Unspecified intestinal obstruction, unspecified as to partial versus complete obstruction: Secondary | ICD-10-CM | POA: Diagnosis not present

## 2022-02-06 LAB — BASIC METABOLIC PANEL
Anion gap: 12 (ref 5–15)
BUN: 17 mg/dL (ref 8–23)
CO2: 23 mmol/L (ref 22–32)
Calcium: 9.1 mg/dL (ref 8.9–10.3)
Chloride: 105 mmol/L (ref 98–111)
Creatinine, Ser: 0.61 mg/dL (ref 0.44–1.00)
GFR, Estimated: >60 mL/min (ref >60)
Glucose, Bld: 113 mg/dL — ABNORMAL HIGH (ref 70–99)
Potassium: 3.1 mmol/L — ABNORMAL LOW (ref 3.5–5.1)
Sodium: 140 mmol/L (ref 135–145)

## 2022-02-06 LAB — CBC
HCT: 38.4 % (ref 36.0–46.0)
Hemoglobin: 12 g/dL (ref 12.0–15.0)
MCH: 31.4 pg (ref 26.0–34.0)
MCHC: 31.3 g/dL (ref 30.0–36.0)
MCV: 100.5 fL — ABNORMAL HIGH (ref 80.0–100.0)
Platelets: 479 10*3/uL — ABNORMAL HIGH (ref 150–400)
RBC: 3.82 MIL/uL — ABNORMAL LOW (ref 3.87–5.11)
RDW: 13.8 % (ref 11.5–15.5)
WBC: 10 10*3/uL (ref 4.0–10.5)
nRBC: 0 % (ref 0.0–0.2)

## 2022-02-06 LAB — GLUCOSE, CAPILLARY
Glucose-Capillary: 106 mg/dL — ABNORMAL HIGH (ref 70–99)
Glucose-Capillary: 107 mg/dL — ABNORMAL HIGH (ref 70–99)
Glucose-Capillary: 111 mg/dL — ABNORMAL HIGH (ref 70–99)
Glucose-Capillary: 126 mg/dL — ABNORMAL HIGH (ref 70–99)
Glucose-Capillary: 132 mg/dL — ABNORMAL HIGH (ref 70–99)
Glucose-Capillary: 89 mg/dL (ref 70–99)
Glucose-Capillary: 96 mg/dL (ref 70–99)

## 2022-02-06 LAB — MAGNESIUM: Magnesium: 2.1 mg/dL (ref 1.7–2.4)

## 2022-02-06 MED ORDER — METOPROLOL TARTRATE 5 MG/5ML IV SOLN: 2.5000 mg | Freq: Four times a day (QID) | INTRAVENOUS | Status: DC

## 2022-02-06 MED ORDER — SODIUM CHLORIDE 0.9% FLUSH: 10.0000 mL | INTRAVENOUS | Status: DC | PRN

## 2022-02-06 MED ORDER — METOPROLOL TARTRATE 5 MG/5ML IV SOLN
5.0000 mg | Freq: Four times a day (QID) | INTRAVENOUS | Status: DC
  Administered 2022-02-06 – 2022-02-07 (×4): 5 mg via INTRAVENOUS
  Filled 2022-02-06 (×4): qty 5

## 2022-02-06 MED ORDER — POTASSIUM CHLORIDE 10 MEQ/100ML IV SOLN
10.0000 meq | INTRAVENOUS | Status: AC
  Administered 2022-02-06 (×4): 10 meq via INTRAVENOUS
  Filled 2022-02-06 (×4): qty 100

## 2022-02-06 MED ORDER — POTASSIUM CHLORIDE IN NACL 40-0.9 MEQ/L-% IV SOLN
INTRAVENOUS | Status: DC
  Filled 2022-02-06 (×2): qty 1000

## 2022-02-06 NOTE — TOC Progression Note (Signed)
Transition of Care [facility]) - Progression Note    Patient Details  Name: [name] MRN: [id number] Date of Birth: 1933/04/12  [facility]) CM/SW Contact  Henrietta Dine, RN Phone Number: 02/06/2022, 4:28 PM  Clinical Narrative:     Pt from [facility]; NG in place; not ready for d/c.       Expected Discharge Plan and Services                                                 Social Determinants of Health (SDOH) Interventions    Readmission Risk Interventions     No data to display

## 2022-02-06 NOTE — Progress Notes (Signed)
Patient with elevated heart rate and respiration, now RED MEWs, PRN metoprolol given and MD/charge/ rapid response made aware. Will continue to monitor patient.     02/06/22 0958  Assess: MEWS Score  Temp 98.2 F (36.8 C)  BP (!) 143/78  MAP (mmHg) 89  Pulse Rate (!) 150  Resp (!) 30  Level of Consciousness Alert  SpO2 91 %  O2 Device Nasal Cannula  O2 Flow Rate (L/min) 3 L/min  Assess: MEWS Score  MEWS Temp 0  MEWS Systolic 0  MEWS Pulse 3  MEWS RR 2  MEWS LOC 0  MEWS Score 5  MEWS Score Color Red  Assess: if the MEWS score is Yellow or Red  Were vital signs taken at a resting state? Yes  Focused Assessment Change from prior assessment (see assessment flowsheet)  Does the patient meet 2 or more of the SIRS criteria? Yes  Does the patient have a confirmed or suspected source of infection? Yes  Provider and Rapid Response Notified? Yes  MEWS guidelines implemented *See Row Information* Yes  Take Vital Signs  Increase Vital Sign Frequency  Red: Q 1hr X 4 then Q 4hr X 4, if remains red, continue Q 4hrs  Assess: SIRS CRITERIA  SIRS Temperature  0  SIRS Pulse 1  SIRS Respirations  1  SIRS WBC 1  SIRS Score Sum  3

## 2022-02-06 NOTE — Progress Notes (Signed)
Tried inserting NG-tube, unsuccessful by 3 nurses, tube curling in patient's mouth and patient also started having nose bleed. Dr. Lupita Leash and Marlou Starks notified. Will continue to monitor patient.

## 2022-02-06 NOTE — Progress Notes (Deleted)
Tried inserting NG-tube, unsuccessful by 3 nurses, tube curling in the mouth and patient started having nose bleed. Dr. Lupita Leash and Dr. Marlou Starks notified, will continue to assess patient.

## 2022-02-06 NOTE — Progress Notes (Signed)
Subjective/Chief Complaint: Still complains of abd pain. Has had some nausea and vomiting. Likely needs ng tube   Objective: Vital signs in last 24 hours: Temp:  [98.6 F (37 C)-98.7 F (37.1 C)] 98.6 F (37 C) (10/22 0415) Pulse Rate:  [52-106] 59 (10/22 0415) Resp:  [17-22] 20 (10/22 0415) BP: (129-167)/(56-90) 167/90 (10/22 0415) SpO2:  [95 %-98 %] 95 % (10/22 0415) Weight:  [51.4 kg] 51.4 kg (10/22 0500) Last BM Date : 02/04/22  Intake/Output from previous day: 10/21 0701 - 10/22 0700 In: 514.3 [P.O.:90; I.V.:224.3; IV Piggyback:200] Out: 500 [Urine:500] Intake/Output this shift: No intake/output data recorded.  General appearance: alert and cooperative Resp: clear to auscultation bilaterally Cardio: regular rate and rhythm GI: soft, moderate tenderness. Mild distension  Lab Results:  Recent Labs    02/05/22 0550 02/06/22 0220  WBC 8.2 10.0  HGB 11.8* 12.0  HCT 37.5 38.4  PLT 501* 479*   BMET Recent Labs    02/05/22 0550 02/06/22 0220  NA 138 140  K 3.6 3.1*  CL 106 105  CO2 22 23  GLUCOSE 112* 113*  BUN 22 17  CREATININE 0.70 0.61  CALCIUM 9.3 9.1   PT/INR No results for input(s): "LABPROT", "INR" in the last 72 hours. ABG Recent Labs    02/02/2022 1634  HCO3 25.4    Studies/Results: DG Abd Portable 1V-Small Bowel Obstruction Protocol-initial, 8 hr delay  Result Date: 02/06/2022 CLINICAL DATA:  Small bowel obstruction, 8 hour delay EXAM: PORTABLE ABDOMEN - 1 VIEW COMPARISON:  02/04/2022 FINDINGS: A single abdominal images obtained 8 hours after administration of oral contrast material. Most of contrast appears to remain in the stomach with suggestion of some contrast in left lower quadrant small bowel. No contrast is demonstrated in the colon. Changes likely indicate high-grade obstruction. Dilated gas and fluid-filled small bowel consistent with small-bowel obstruction. Small right pleural effusion with basilar atelectasis or consolidation  suggested. Postoperative changes in the lumbar spine. IMPRESSION: No contrast material demonstrated in the colon suggesting no evidence of high-grade small bowel obstruction. Electronically Signed   By: Lucienne Capers M.D.   On: 02/06/2022 01:29   DG CHEST PORT 1 VIEW  Result Date: 02/05/2022 CLINICAL DATA:  PICC line placement. EXAM: PORTABLE CHEST 1 VIEW COMPARISON:  Two-view chest x-ray 01/23/2022. FINDINGS: Heart is enlarged. Large hiatal hernia is in noted. Right pleural effusion scratched at right greater than left pleural effusion and asymmetric airspace disease is noted. A new right-sided PICC line is in place. The tip is at the cavoatrial junction. IMPRESSION: 1. New right-sided PICC line with the tip at the cavoatrial junction. No radiographic evidence for complication. 2. Cardiomegaly without failure. 3. Right greater than left pleural effusions and asymmetric airspace disease. Electronically Signed   By: San Morelle M.D.   On: 02/05/2022 16:58   [place] EKG SITE RITE  Result Date: 02/05/2022 If Site Rite image not attached, placement could not be confirmed due to current cardiac rhythm.  DG Abd Portable 1V  Result Date: 02/04/2022 CLINICAL DATA:  [id number] SBO (small bowel obstruction) ([place]) [id number], abdominal pain, nausea, vomiting and abdominal distension EXAM: PORTABLE ABDOMEN - 1 VIEW COMPARISON:  09/22/2010 abdominal radiograph, 02/02/2022 CT abdomen/pelvis FINDINGS: Diffuse mild-to-moderate small bowel dilatation up to 4.1 cm diameter, not appreciably changed from CT. Excreted contrast noted in the mildly distended bladder. No evidence of pneumatosis or pneumoperitoneum. Bilateral posterior lumbar spinal fusion hardware. IMPRESSION: No appreciable change in diffuse mild-to-moderate small bowel dilatation, compatible with distal small  bowel obstruction. Electronically Signed   By: Ilona Sorrel M.D.   On: 02/04/2022 09:00    Anti-infectives: Anti-infectives (From admission,  onward)    Start     Dose/Rate Route Frequency Ordered Stop   02/04/22 0600  ceFEPIme (MAXIPIME) 2 g in sodium chloride 0.9 % 100 mL IVPB  Status:  Discontinued        2 g 200 mL/hr over 30 Minutes Intravenous Every 12 hours 01/31/2022 1534 02/02/2022 1541   02/04/22 0400  ceFEPIme (MAXIPIME) 2 g in sodium chloride 0.9 % 100 mL IVPB        2 g 200 mL/hr over 30 Minutes Intravenous Every 12 hours 01/31/2022 2226     02/12/2022 1545  ceFEPIme (MAXIPIME) 2 g in sodium chloride 0.9 % 100 mL IVPB        2 g 200 mL/hr over 30 Minutes Intravenous  Once 01/20/2022 1528 01/29/2022 1636   02/12/2022 1515  vancomycin (VANCOCIN) IVPB 1000 mg/200 mL premix        1,000 mg 200 mL/hr over 60 Minutes Intravenous  Once 01/21/2022 1510 02/10/2022 2037       Assessment/Plan: s/p * No surgery found * Continue bowel rest for sbo. Likely needs ng. Once it is placed then can start the small bowel protocol Continue IV abx for pneumonia Will need PICC for IV access SBO - CT w/ dilated loops of small bowel with query of transition point in the mid abdomen the small bowel dilated up to 3 cm.  Plain film from this morning with small bowel dilated to 4.1 cm.  She does have history of prior open appendectomy and hysterectomy.  Suspect SBO likely from adhesions.  - No current indication for emergency surgery - Place NGT for decompression and keep NPO - Start SBO protocol - Keep K > 4 and Mg > 2 for bowel function - Mobilize for bowel function - Hopefully patient will improve with conservative management. If patient fails to improve with conservative management, they may require exploratory surgery during admission.  Appears palliative has been consulted for [place].  - Agree with medical admission. We will follow with you.   FEN -NPO, IV fluid per TRH.  Placed NG tube VTE -SCDs.  Okay for chemical prophylaxis from a general surgery standpoint ID - Cefepime for pneumonia. Foley - None Dispo - Admit to TR[facility]. Place NGT. SBO protocol    PAF not on anticoagulation  Hypertension  PNA  LOS: 3 days    [name] 02/06/2022

## 2022-02-06 NOTE — Progress Notes (Signed)
PROGRESS NOTE [name]  JJK:[id number] DOB: January 06, 1933 DOA: 02/15/2022 PCP: Berniece Salines, DO   Brief Narrative/Hospital Course: 88 year old female with P atrial flutter not on anticoagulation, hypertension recent back surgery, on chronic high-dose morphine presented with shortness of breath persistent diffuse abdominal pain nausea vomiting and constipation x3 days PTA Seen in the [facility] ED 10/16-diagnosed with pneumonia/pleural effusion discharged back to facility on antibiotics. Due to persistent similar pain presented to [facility] ED 10/19- w/ pleuritic pain worse with deep breath chest x-ray worsening pneumonia. CTA negative for PE revealed persistent left lower lobe tree-in-bud nodularity suggestive of infection/inflammation, trace bilateral pleural effusion, large hiatal hernia, neuropathy small bowel obstruction query transition point in the mid abdomen.  Pneumonia, CT abdomen pelvis with contrast revealed partial small bowel obstruction. surgery was consulted and patient was admitted. Labs showed BNP 438.  Serum sodium 133, glucose 145, BUN 35, creatinine 0.83. Followed by general surgery patient refused NG tube okay to hold off  Subjective: Seen this am Complains of ongoing belly pain pain all over, tachycardic and tachypneic this morning having nausea, and also threw up. Overnight afebrile Labs with low potassium BNP trending up 669, not needing oxygen however. Agrees for NG tube She takes morphine twice a day- 100 mg for her back, says she has not walked In few years on WC HR 80-150S. Nursing has to put on 3 L [place]-saturating 91 to 95%  Assessment and Plan: Principal Problem:   SBO (small bowel obstruction) (HCC) Active Problems:   Pneumonia  Partial small bowel obstruction: General surgery following, NG tube was planned but patient refused multiple times.Per surgery 10/22-okay to hold off.  Gentle IVF, x-ray 10/23 most of the contrast in the stomach, some in the left lower quadrant  small bowel, no contrast in the colon indicating high-grade obstruction.Repleting electrolytes.Patient agrees for NG tube-discussed with nursing staff for NG tube placement.  Blood sugar 89 monitor  Hypokalemia repleting aggressively IVF and IV Kcl.mag normal. Recent Labs  Lab 01/31/22 0809 02/04/2022 1634 02/10/2022 1635 02/04/22 0500 02/05/22 0550 02/06/22 0220  K 3.8 4.3 4.3 3.9 3.6 3.1*  CALCIUM 9.8 9.4  --  9.2 9.3 9.1  MG  --   --   --  2.1  --  2.1  PHOS  --   --   --  2.7  --   --     Hypovolemic hyponatremia:resolved. Gentle IVF  Community-acquired pneumonia:recently discharged on Augmentin and azithromycin> continue with cefepime.leukocytosis resolved lactic acidosis resolved,MRSA screen negative.Continue pulmonary toilet and incentive spirometry supplemental oxygen as needed. Recent Labs  Lab 01/31/22 1055 01/31/22 1231 02/12/2022 1634 01/31/2022 1636 01/17/2022 2218 02/04/22 0500 02/05/22 0550 02/06/22 0220  WBC  --   --  12.4*  --  12.0* 15.7* 8.2 10.0  LATICACIDVEN 2.0* 1.8  --  1.1  --   --   --   --    Acute hypoxic respiratory failure from pneumonia/chf:needing 3l [place]. Check cxr.Needing gentle ivf for sbp-hold off if fluid overload and try lasix.   Chronic pain/back pain Recent back surgery wheelchair-bound Chronic opiate use: She is on morphine sulfate 100 mg bid.unable to use p.o.Continue with IV Dilaudid prn.  PAF rate controlled not on anticoagulation.Cont Coreg>changed to IV Lopressor> change to q6h with holding parameters while NPO. Moniter in tele  Chronic diastolic BZJ:IRCV lvef normal and g1dd I nuly 2023, BNP trending up,needing IVF for bowel obstruction, watch for fluid overload, monitor daily weight as below-trending down>repeat chest x-ray along with x-ray abdomen  after NG tube this am-to watch for any pulm edmea-given tachypnea.Net IO Since Admission: 612 mL [02/06/22 1444]  Filed Weights   01/18/2022 1415 02/05/22 0427 02/06/22 0500  Weight: 53.5 kg 51.7  kg 51.4 kg   Elevated troponin 47-40:?Etiology,repeat flat suspect demand ischemia due to acute medical illness  Goals of care:Cont full code.Overall prognosis remains to be seen.  Palliative care had seen-continue with full scope of treatment. I updated her son- he understands overall situation that she is going through with bowel obstruction A-fib pneumonia now with hypoxia tachycardia and we are monitoring closely> transferred to stepdown if ongoing vitals instability.  He states he is her healthcare power of attorney once she is not able to make any decision. Patient is okay with me discussing with her son.  DVT prophylaxis: enoxaparin (LOVENOX) injection 40 mg Start: 02/10/2022 2200 Code Status:   Code Status: Full Code Family Communication: plan of care discussed with patient at bedside.  Son updated 10/22 extensively.  Patient status is: Inpatient because of pneumonia and abdominal pain bowel obstruction management Level of care: Progressive  Dispo: The patient is from: Skilled nursing facility            Anticipated disposition:TBD  Mobility Assessment (last 72 hours)     Mobility Assessment     Row Name 02/06/22 7628 02/05/22 1907 02/04/22 1700 02/04/22 10:09:41     Does patient have an order for bedrest or is patient medically unstable No - Continue assessment No - Continue assessment No - Continue assessment No - Continue assessment    What is the highest level of mobility based on the progressive mobility assessment? Level 2 ([place]) - Balance while sitting on edge of bed and cannot stand -- Level 2 ([place]) - Balance while sitting on edge of bed and cannot stand --    Is the above level different from baseline mobility prior to current illness? -- -- No - Consider discontinuing PT/OT --              Objective: Vitals last 24 hrs: Vitals:   02/06/22 0958 02/06/22 1051 02/06/22 1200 02/06/22 1300  BP: (!) 143/78 (!) 140/64 (!) 146/80 119/72  Pulse: (!) 150 (!) 55  67 (!) 107  Resp: (!) 30 (!) 34 (!) 28 (!) 24  Temp: 98.2 F (36.8 C) 98.6 F (37 C) (!) 97.3 F (36.3 C) 98.6 F (37 C)  TempSrc: Axillary Axillary Oral Oral  SpO2: 91% 96% 91% 94%  Weight:      Height:       Weight change: -0.3 kg  Physical Examination: General exam: AA oriented, chronically ill-appearing thin frail, tachypneic and tachycardic with some distress HEENT:Oral mucosa moist, Ear/Nose WNL grossly, dentition normal. Respiratory system: bilaterally diminished breath sounds with mild basal crackles, tachypnea,no use of accessory muscle Cardiovascular system: S1 & S2 +, diminished breath sound, Gastrointestinal system: Abdomen soft, tender generalized, bowel sounds sluggish  Nervous System:Alert, awake, moving extremities and grossly nonfocal Extremities: LE ankle edema neg, lower extremities warm Skin: No rashes,no icterus. MSK: thin muscle bulk,tone, power    Medications reviewed:  Scheduled Meds:  aspirin EC  81 mg Oral Daily   Chlorhexidine Gluconate Cloth  6 each Topical Daily   enoxaparin (LOVENOX) injection  40 mg Subcutaneous Q24H   insulin aspart  0-9 Units Subcutaneous Q4H   metoprolol tartrate  2.5 mg Intravenous Q8H   pantoprazole (PROTONIX) IV  40 mg Intravenous Q24H   sertraline  25 mg Oral Daily  sodium chloride flush  10-40 mL Intracatheter Q12H   umeclidinium bromide  1 puff Inhalation Daily  Continuous Infusions:  0.9 % NaCl with KCl 40 mEq / L 30 mL/hr at 02/06/22 1001   ceFEPime (MAXIPIME) IV 2 g (02/06/22 0341)    Diet Order             Diet NPO time specified  Diet effective now                   Intake/Output Summary (Last 24 hours) at 02/06/2022 1444 Last data filed at 02/06/2022 1434 Gross per 24 hour  Intake 898.01 ml  Output 900 ml  Net -1.99 ml   Net IO Since Admission: 612 mL [02/06/22 1444]  Wt Readings from Last 3 Encounters:  02/06/22 51.4 kg  01/31/22 53.5 kg  11/22/21 52.2 kg    Unresulted Labs (From  admission, onward)     Start     Ordered   02/10/22 0500  Creatinine, serum  (enoxaparin (LOVENOX)    CrCl >/= 30 ml/min)  Weekly,   R     Comments: while on enoxaparin therapy    02/08/2022 2108   02/05/22 3151  Basic metabolic panel  Daily at 5am,   R      02/04/22 0746   02/05/22 0500  CBC  Daily at 5am,   R      02/04/22 0746          Data Reviewed: I have personally reviewed following labs and imaging studies CBC: Recent Labs  Lab 01/29/2022 1634 01/26/2022 1635 02/07/2022 2218 02/04/22 0500 02/05/22 0550 02/06/22 0220  WBC 12.4*  --  12.0* 15.7* 8.2 10.0  NEUTROABS 11.6*  --   --  13.8*  --   --   HGB 12.2 13.6 11.5* 12.0 11.8* 12.0  HCT 38.4 40.0 36.0 37.9 37.5 38.4  MCV 98.5  --  97.6 98.4 98.9 100.5*  PLT 406*  --  410* 492* 501* 761*   Basic Metabolic Panel: Recent Labs  Lab 01/31/22 0809 02/01/2022 1634 02/02/2022 1635 01/28/2022 2218 02/04/22 0500 02/05/22 0550 02/06/22 0220  NA 139 133* 133*  --  136 138 140  K 3.8 4.3 4.3  --  3.9 3.6 3.1*  CL 103 100 99  --  103 106 105  CO2 25 23  --   --  '23 22 23  '$ GLUCOSE 149* 149* 145*  --  119* 112* 113*  BUN 20 36* 35*  --  32* 22 17  CREATININE 0.82 1.00 0.90 0.83 0.90 0.70 0.61  CALCIUM 9.8 9.4  --   --  9.2 9.3 9.1  MG  --   --   --   --  2.1  --  2.1  PHOS  --   --   --   --  2.7  --   --    GFR: Estimated Creatinine Clearance: 40.2 mL/min (by C-G formula based on SCr of 0.61 mg/dL). Liver Function Tests: Recent Labs  Lab 01/31/22 0809 01/31/2022 1634  AST 19 19  ALT 9 10  ALKPHOS 57 53  BILITOT 0.3 0.9  PROT 7.4 7.4  ALBUMIN 3.8 3.3*   Recent Labs  Lab 01/31/22 0809 01/20/2022 1635  LIPASE 25 24  Sepsis Labs: Recent Labs  Lab 01/31/22 1055 01/31/22 1231 02/12/2022 1636  LATICACIDVEN 2.0* 1.8 1.1    Recent Results (from the past 240 hour(s))  SARS Coronavirus 2 by RT PCR (hospital order, performed in  [facility] hospital lab) *cepheid single result test* Anterior Nasal Swab     Status: None    Collection Time: 02/10/2022  2:25 PM   Specimen: Anterior Nasal Swab  Result Value Ref Range Status   SARS Coronavirus 2 by RT PCR NEGATIVE NEGATIVE Final    Comment: (NOTE) SARS-CoV-2 target nucleic acids are NOT DETECTED.  The SARS-CoV-2 RNA is generally detectable in upper and lower respiratory specimens during the acute phase of infection. The lowest concentration of SARS-CoV-2 viral copies this assay can detect is 250 copies / mL. A negative result does not preclude SARS-CoV-2 infection and should not be used as the sole basis for treatment or other patient management decisions.  A negative result may occur with improper specimen collection / handling, submission of specimen other than nasopharyngeal swab, presence of viral mutation(s) within the areas targeted by this assay, and inadequate number of viral copies (<250 copies / mL). A negative result must be combined with clinical observations, patient history, and epidemiological information.  Fact Sheet for Patients:   https://www.patel.info/  Fact Sheet for Healthcare Providers: https://hall.com/  This test is not yet approved or  cleared by the [place] FDA and has been authorized for detection and/or diagnosis of SARS-CoV-2 by FDA under an Emergency Use Authorization (EUA).  This EUA will remain in effect (meaning this test can be used) for the duration of the COVID-19 declaration under Section 564(b)(1) of the Act, 21 U.S.C. section 360bbb-3(b)(1), unless the authorization is terminated or revoked sooner.  Performed at [facility], [place] [address] [place], [place] [address]   Blood culture (routine x 2)     Status: None (Preliminary result)   Collection Time: 01/23/2022  4:00 PM   Specimen: BLOOD  Result Value Ref Range Status   Specimen Description   Final    BLOOD BLOOD RIGHT FOREARM Performed at [place] [address]  [place], [place] [address]    Special Requests   Final    BOTTLES DRAWN AEROBIC AND ANAEROBIC Blood Culture results may not be optimal due to an excessive volume of blood received in culture bottles Performed at [place] [address] [place], [place] [address]    Culture   Final    NO GROWTH 3 DAYS Performed at [facility], [place] [address], [place], [place] [address]    Report Status PENDING  Incomplete  Blood culture (routine x 2)     Status: None (Preliminary result)   Collection Time: 02/08/2022  4:00 PM   Specimen: BLOOD  Result Value Ref Range Status   Specimen Description   Final    BLOOD LEFT ANTECUBITAL Performed at [place] [address], [place], [place] [address]    Special Requests   Final    BOTTLES DRAWN AEROBIC AND ANAEROBIC Blood Culture adequate volume Performed at [place] [address], [place], [place] [address]    Culture   Final    NO GROWTH 3 DAYS Performed at [facility], [place] [address], [place],  [address]    Report Status PENDING  Incomplete  MRSA Next Gen by PCR, Nasal     Status: None   Collection Time: 02/15/2022  4:37 PM   Specimen: Nasal Swab  Result Value Ref Range Status   MRSA by PCR Next Gen NOT DETECTED NOT DETECTED Final    Comment: (NOTE) The GeneXpert MRSA Assay (FDA approved for NASAL  specimens only), is one component of a comprehensive MRSA colonization surveillance program. It is not intended to diagnose MRSA infection nor to guide or monitor treatment for MRSA infections. Test performance is not FDA approved in patients less than 44 years old. Performed at [facility] [place] [address] [place], [place] [address]     Antimicrobials: Anti-infectives (From admission, onward)    Start     Dose/Rate Route Frequency Ordered Stop   02/04/22 0600  ceFEPIme (MAXIPIME) 2 g in sodium chloride 0.9 % 100 mL IVPB  Status:  Discontinued         2 g 200 mL/hr over 30 Minutes Intravenous Every 12 hours 02/08/2022 1534 01/26/2022 1541   02/04/22 0400  ceFEPIme (MAXIPIME) 2 g in sodium chloride 0.9 % 100 mL IVPB        2 g 200 mL/hr over 30 Minutes Intravenous Every 12 hours 01/23/2022 2226     01/27/2022 1545  ceFEPIme (MAXIPIME) 2 g in sodium chloride 0.9 % 100 mL IVPB        2 g 200 mL/hr over 30 Minutes Intravenous  Once 02/02/2022 1528 02/02/2022 1636   01/21/2022 1515  vancomycin (VANCOCIN) IVPB 1000 mg/200 mL premix        1,000 mg 200 mL/hr over 60 Minutes Intravenous  Once 02/09/2022 1510 02/04/2022 2037      Culture/Microbiology    Component Value Date/Time   SDES  01/23/2022 1600    BLOOD BLOOD RIGHT FOREARM Performed at [facility], [place] [address], [place], [place] [address]    SDES  02/10/2022 1600    BLOOD LEFT ANTECUBITAL Performed at [facility], [place] [address] [place], [place] [address]    [place]  02/05/2022 1600    BOTTLES DRAWN AEROBIC AND ANAEROBIC Blood Culture results may not be optimal due to an excessive volume of blood received in culture bottles Performed at [facility], [place] [address], [place], [place] [address]    SPECREQUEST  02/08/2022 1600    BOTTLES DRAWN AEROBIC AND ANAEROBIC Blood Culture adequate volume Performed at [place] [address] [place], [place] [address]    CULT  01/18/2022 1600    NO GROWTH 3 DAYS Performed at [place] [address], [place], [place] [address]    CULT  02/12/2022 1600    NO GROWTH 3 DAYS Performed at [place] [address] [place],  [address]    REPTSTATUS PENDING 02/04/2022 1600   REPTSTATUS PENDING 01/19/2022 1600    Other culture-see note  Radiology Studies: DG Abd Portable 1V-Small Bowel Obstruction Protocol-initial, 8 hr delay  Result Date: 02/06/2022 CLINICAL DATA:  Small bowel obstruction, 8 hour delay EXAM: PORTABLE ABDOMEN - 1  VIEW COMPARISON:  02/04/2022 FINDINGS: A single abdominal images obtained 8 hours after administration of oral contrast material. Most of contrast appears to remain in the stomach with suggestion of some contrast in left lower quadrant small bowel. No contrast is demonstrated in the colon. Changes likely indicate high-grade obstruction. Dilated gas and fluid-filled small bowel consistent with small-bowel obstruction. Small right pleural effusion with basilar atelectasis or consolidation suggested. Postoperative changes in the lumbar spine. IMPRESSION: No contrast material demonstrated in the colon suggesting no evidence of high-grade small bowel obstruction. Electronically Signed   By: Lucienne Capers M.D.   On: 02/06/2022 01:29   DG CHEST PORT 1 VIEW  Result Date: 02/05/2022 CLINICAL DATA:  PICC line  placement. EXAM: PORTABLE CHEST 1 VIEW COMPARISON:  Two-view chest x-ray 01/29/2022. FINDINGS: Heart is enlarged. Large hiatal hernia is in noted. Right pleural effusion scratched at right greater than left pleural effusion and asymmetric airspace disease is noted. A new right-sided PICC line is in place. The tip is at the cavoatrial junction. IMPRESSION: 1. New right-sided PICC line with the tip at the cavoatrial junction. No radiographic evidence for complication. 2. Cardiomegaly without failure. 3. Right greater than left pleural effusions and asymmetric airspace disease. Electronically Signed   By: San Morelle M.D.   On: 02/05/2022 16:58   [place] EKG SITE RITE  Result Date: 02/05/2022 If Site Rite image not attached, placement could not be confirmed due to current cardiac rhythm.    LOS: 3 days   Antonieta Pert, MD [facility]  02/06/2022, 2:44 PM

## 2022-02-06 NOTE — Progress Notes (Signed)
Patient with elevated heart rate and respiration, now RED MEWs, PRN metoprolol given and MD/charge/ rapid response made aware. Will continue to monitor patient.

## 2022-02-07 ENCOUNTER — Inpatient Hospital Stay (HOSPITAL_COMMUNITY): Payer: Medicare (Managed Care)

## 2022-02-07 DIAGNOSIS — K56609 Unspecified intestinal obstruction, unspecified as to partial versus complete obstruction: Secondary | ICD-10-CM | POA: Diagnosis not present

## 2022-02-07 LAB — URINALYSIS, ROUTINE W REFLEX MICROSCOPIC
Bacteria, UA: NONE SEEN
Bilirubin Urine: NEGATIVE
Glucose, UA: NEGATIVE mg/dL
Ketones, ur: 80 mg/dL — AB
Leukocytes,Ua: NEGATIVE
Nitrite: NEGATIVE
Protein, ur: 100 mg/dL — AB
Specific Gravity, Urine: 1.025 (ref 1.005–1.030)
pH: 6 (ref 5.0–8.0)

## 2022-02-07 LAB — CBC
HCT: 38.6 % (ref 36.0–46.0)
Hemoglobin: 11.9 g/dL — ABNORMAL LOW (ref 12.0–15.0)
MCH: 31 pg (ref 26.0–34.0)
MCHC: 30.8 g/dL (ref 30.0–36.0)
MCV: 100.5 fL — ABNORMAL HIGH (ref 80.0–100.0)
Platelets: 443 10*3/uL — ABNORMAL HIGH (ref 150–400)
RBC: 3.84 MIL/uL — ABNORMAL LOW (ref 3.87–5.11)
RDW: 14.1 % (ref 11.5–15.5)
WBC: 17.3 10*3/uL — ABNORMAL HIGH (ref 4.0–10.5)
nRBC: 0 % (ref 0.0–0.2)

## 2022-02-07 LAB — PROCALCITONIN: Procalcitonin: 0.41 ng/mL

## 2022-02-07 LAB — BASIC METABOLIC PANEL
Anion gap: 13 (ref 5–15)
BUN: 17 mg/dL (ref 8–23)
CO2: 20 mmol/L — ABNORMAL LOW (ref 22–32)
Calcium: 9.2 mg/dL (ref 8.9–10.3)
Chloride: 105 mmol/L (ref 98–111)
Creatinine, Ser: 0.7 mg/dL (ref 0.44–1.00)
GFR, Estimated: >60 mL/min (ref >60)
Glucose, Bld: 114 mg/dL — ABNORMAL HIGH (ref 70–99)
Potassium: 3.6 mmol/L (ref 3.5–5.1)
Sodium: 138 mmol/L (ref 135–145)

## 2022-02-07 LAB — GLUCOSE, CAPILLARY
Glucose-Capillary: 108 mg/dL — ABNORMAL HIGH (ref 70–99)
Glucose-Capillary: 108 mg/dL — ABNORMAL HIGH (ref 70–99)
Glucose-Capillary: 116 mg/dL — ABNORMAL HIGH (ref 70–99)
Glucose-Capillary: 116 mg/dL — ABNORMAL HIGH (ref 70–99)
Glucose-Capillary: 122 mg/dL — ABNORMAL HIGH (ref 70–99)
Glucose-Capillary: 132 mg/dL — ABNORMAL HIGH (ref 70–99)

## 2022-02-07 MED ORDER — HYDROMORPHONE HCL 1 MG/ML IJ SOLN
0.5000 mg | INTRAMUSCULAR | Status: DC | PRN
  Administered 2022-02-07 – 2022-02-09 (×10): 0.5 mg via INTRAVENOUS
  Filled 2022-02-07 (×11): qty 0.5

## 2022-02-07 MED ORDER — METRONIDAZOLE 500 MG/100ML IV SOLN
500.0000 mg | Freq: Two times a day (BID) | INTRAVENOUS | Status: DC
  Administered 2022-02-07 – 2022-02-11 (×9): 500 mg via INTRAVENOUS
  Filled 2022-02-07 (×9): qty 100

## 2022-02-07 MED ORDER — CARVEDILOL 6.25 MG PO TABS
6.2500 mg | ORAL_TABLET | Freq: Two times a day (BID) | ORAL | Status: DC
  Administered 2022-02-07 – 2022-02-10 (×7): 6.25 mg via ORAL
  Filled 2022-02-07 (×7): qty 1

## 2022-02-07 MED ORDER — ORAL CARE MOUTH RINSE: 15.0000 mL | OROMUCOSAL | Status: DC | PRN

## 2022-02-07 MED ORDER — MORPHINE SULFATE ER 30 MG PO TBCR
30.0000 mg | EXTENDED_RELEASE_TABLET | Freq: Two times a day (BID) | ORAL | Status: DC
  Administered 2022-02-07 – 2022-02-08 (×3): 30 mg via ORAL
  Filled 2022-02-07 (×4): qty 1

## 2022-02-07 MED ORDER — MELATONIN 3 MG PO TABS
3.0000 mg | ORAL_TABLET | Freq: Every day | ORAL | Status: DC
  Administered 2022-02-07 – 2022-02-10 (×4): 3 mg via ORAL
  Filled 2022-02-07 (×4): qty 1

## 2022-02-07 MED ORDER — METOPROLOL TARTRATE 5 MG/5ML IV SOLN
2.5000 mg | Freq: Four times a day (QID) | INTRAVENOUS | Status: DC | PRN
  Administered 2022-02-08: 2.5 mg via INTRAVENOUS
  Filled 2022-02-07: qty 5

## 2022-02-07 MED ORDER — FUROSEMIDE 20 MG PO TABS
20.0000 mg | ORAL_TABLET | ORAL | Status: DC
  Administered 2022-02-07 – 2022-02-09 (×2): 20 mg via ORAL
  Filled 2022-02-07 (×2): qty 1

## 2022-02-07 NOTE — Progress Notes (Addendum)
PROGRESS NOTE [name]  HYI:[id number] DOB: 24-Feb-1933 DOA: 02/02/2022 PCP: Berniece Salines, DO   Brief Narrative/Hospital Course: 88 year old female with P atrial flutter not on anticoagulation, hypertension recent back surgery, on chronic high-dose morphine presented with shortness of breath persistent diffuse abdominal pain nausea vomiting and constipation x3 days PTA Seen in the [facility] ED 10/16-diagnosed with pneumonia/pleural effusion discharged back to facility on antibiotics. Due to persistent similar pain presented to [facility] ED 10/19- w/ pleuritic pain worse with deep breath chest x-ray worsening pneumonia. CTA negative for PE revealed persistent left lower lobe tree-in-bud nodularity suggestive of infection/inflammation, trace bilateral pleural effusion, large hiatal hernia, neuropathy small bowel obstruction query transition point in the mid abdomen.  Pneumonia, CT abdomen pelvis with contrast revealed partial small bowel obstruction. surgery was consulted and patient was admitted. Labs showed BNP 438.  Serum sodium 133, glucose 145, BUN 35, creatinine 0.83. Followed by general surgery patient refused NG tube okay to hold off  Subjective: Seen and examined this morning Reports he feels much better no nausea vomiting, still having abdominal pain but took pain medicine. Passing flatus had BM yesterday Overnight afebrile heart rate 60-110 BP stable On 3 l [place] Noted bump in wbc counts  Assessment and Plan: Principal Problem:   SBO (small bowel obstruction) (HCC) Active Problems:   Pneumonia  Partial small bowel obstruction: General surgery following, NG tube was planned but patient refused multiple times.02/06/22 most of the contrast in the stomach, some in the left lower quadrant small bowel, no contrast in the colon indicating high-grade obstruction.Repleted electrolytes.NGT was attempted multiple times without success 10/22>X-ray this morning shows mild amount of contrast in the right  colon, no abnormal bowel dilatation.  On n.p.o. IV fluids, discussed with Dr. Rosendo Gros, cont plan per [facility]> starting clears.   Hypokalemia resolved.  Keep on gentle IVF  Hypovolemic hyponatremia:resolved. Gentle IVF  Community-acquired pneumonia:recently discharged on Augmentin and azithromycin> continuing here on Cefepime.MRSA screen negative.Continue pulmonary toilet and incentive spirometry supplemental oxygen as needed.  Leukocytosis was trending down now this morning trended up, check UA/procal/ trend CBC. Add iv flagyl for now> can de-escalate once leukocytosis downtrending Recent Labs  Lab 01/31/22 1055 01/31/22 1231 01/29/2022 1634 02/05/2022 1636 01/30/2022 2218 02/04/22 0500 02/05/22 0550 02/06/22 0220 02/07/22 0230  WBC  --   --    < >  --  12.0* 15.7* 8.2 10.0 17.3*  LATICACIDVEN 2.0* 1.8  --  1.1  --   --   --   --   --    < > = values in this interval not displayed.    Acute hypoxic respiratory failure from pneumonia/chf:needing 3l [place]. Check cxr.Needing gentle ivf for sb0-.x-ray 10/22 with by small-to-moderate bilateral pleural effusion, no significant interval change.  Denies shortness of breath or chest pain  Chronic pain/back pain Recent back surgery wheelchair-bound Chronic opiate use: She is on morphine sulfate 100 mg bid.unable to use p.o.-until then continue IV Dilaudid  PAF/atypical atrial flutter: rate uncontrolled,unable to use oral Coreg so placed on IV Lopressor 5 mg every 6 hours, monitor on telemetry, resume Coreg once okay to take p.o. followed by Dr. Sherren Mocha cardiology outpatient and has had cardiac monitor and not started on anticoagulation yet> see continue to follow-up for further recommendation  Chronic diastolic NOM:VEHM lvef normal and g1dd I nuly 2023, BNP trending up,needing IVF for bowel obstruction, watch for fluid overload, monitor daily weight as below-trending down>repeat chest x-ray along with x-ray abdomen after NG tube this am-to watch  for any pulm  edmea-given tachypnea.Net IO Since Admission: 911.77 mL [02/07/22 0826]  Filed Weights   02/05/22 0427 02/06/22 0500 02/07/22 0447  Weight: 51.7 kg 51.4 kg 52.8 kg   Elevated troponin 47-40:?Etiology,repeat flat suspect demand ischemia due to acute medical illness  Goals of care:Cont full code.Overall prognosis remains to be seen.  Palliative care had seen-continue with full scope of treatment. 02/06/22-I updated her son- he understands overall situation that she is going through with bowel obstruction A-fib pneumonia now with hypoxia tachycardia and we are monitoring closely> transferred to stepdown if ongoing vitals instability.  He states he is her healthcare power of attorney once she is not able to make any decision. Patient is okay with me discussing with her son.  DVT prophylaxis: enoxaparin (LOVENOX) injection 40 mg Start: 01/21/2022 2200 Code Status:   Code Status: Full Code Family Communication: plan of care discussed with patient at bedside.  Son updated 10/22 extensively.  Patient status is: Inpatient because of pneumonia and abdominal pain bowel obstruction management Level of care: Progressive  Dispo: The patient is from: Skilled nursing facility            Anticipated disposition:TBD  Mobility Assessment (last 72 hours)     Mobility Assessment     Row Name 02/06/22 2043 02/06/22 0842 02/05/22 1907 02/04/22 1700 02/04/22 10:09:41   Does patient have an order for bedrest or is patient medically unstable No - Continue assessment No - Continue assessment No - Continue assessment No - Continue assessment No - Continue assessment   What is the highest level of mobility based on the progressive mobility assessment? Level 2 (Chairfast) - Balance while sitting on edge of bed and cannot stand Level 2 (Chairfast) - Balance while sitting on edge of bed and cannot stand -- Level 2 (Chairfast) - Balance while sitting on edge of bed and cannot stand --   Is the above level different from  baseline mobility prior to current illness? No - Consider discontinuing PT/OT -- -- No - Consider discontinuing PT/OT --             Objective: Vitals last 24 hrs: Vitals:   02/07/22 0439 02/07/22 0447 02/07/22 0602 02/07/22 0808  BP: (!) 149/95  (!) 158/89 (!) 160/91  Pulse: (!) 102  69 (!) 111  Resp: 20   (!) 22  Temp: 98.1 F (36.7 C)   97.9 F (36.6 C)  TempSrc: Oral   Axillary  SpO2: 92%   92%  Weight:  52.8 kg    Height:       Weight change: 1.4 kg  Physical Examination: General exam: alert awake, pleasant and oriented, not in distress HEENT:Oral mucosa moist, Ear/Nose WNL grossly Respiratory system: Bilaterally diminished breath sounds at bases, no use of accessory muscle Cardiovascular system: S1 & S2 +, No JVD. Gastrointestinal system: Abdomen soft, with generalized tenderness,ND, BS+ Nervous System: Alert, awake, moving extremities, moving all extremities well  Extremities: LE edema neg,distal peripheral pulses palpable.  Skin: No rashes,no icterus. MSK: thin/frail  Medications reviewed:  Scheduled Meds:  aspirin EC  81 mg Oral Daily   Chlorhexidine Gluconate Cloth  6 each Topical Daily   enoxaparin (LOVENOX) injection  40 mg Subcutaneous Q24H   insulin aspart  0-9 Units Subcutaneous Q4H   metoprolol tartrate  5 mg Intravenous Q6H   pantoprazole (PROTONIX) IV  40 mg Intravenous Q24H   sertraline  25 mg Oral Daily   sodium chloride flush  10-40 mL Intracatheter Q12H  umeclidinium bromide  1 puff Inhalation Daily  Continuous Infusions:  0.9 % NaCl with KCl 40 mEq / L 30 mL/hr at 02/06/22 1001   ceFEPime (MAXIPIME) IV 2 g (02/07/22 0443)    Diet Order             Diet NPO time specified  Diet effective now                   Intake/Output Summary (Last 24 hours) at 02/07/2022 0826 Last data filed at 02/07/2022 0808 Gross per 24 hour  Intake 1085.94 ml  Output 650 ml  Net 435.94 ml    Net IO Since Admission: 911.77 mL [02/07/22 0826]  Wt  Readings from Last 3 Encounters:  02/07/22 52.8 kg  01/31/22 53.5 kg  11/22/21 52.2 kg    Unresulted Labs (From admission, onward)     Start     Ordered   02/10/22 0500  Creatinine, serum  (enoxaparin (LOVENOX)    CrCl >/= 30 ml/min)  Weekly,   R     Comments: while on enoxaparin therapy    01/21/2022 2108   02/05/22 4270  Basic metabolic panel  Daily at 5am,   R      02/04/22 0746   02/05/22 0500  CBC  Daily at 5am,   R      02/04/22 0746          Data Reviewed: I have personally reviewed following labs and imaging studies CBC: Recent Labs  Lab 01/20/2022 1634 01/27/2022 1635 01/28/2022 2218 02/04/22 0500 02/05/22 0550 02/06/22 0220 02/07/22 0230  WBC 12.4*  --  12.0* 15.7* 8.2 10.0 17.3*  NEUTROABS 11.6*  --   --  13.8*  --   --   --   HGB 12.2   < > 11.5* 12.0 11.8* 12.0 11.9*  HCT 38.4   < > 36.0 37.9 37.5 38.4 38.6  MCV 98.5  --  97.6 98.4 98.9 100.5* 100.5*  PLT 406*  --  410* 492* 501* 479* 443*   < > = values in this interval not displayed.    Basic Metabolic Panel: Recent Labs  Lab 02/10/2022 1634 02/10/2022 1635 02/07/2022 2218 02/04/22 0500 02/05/22 0550 02/06/22 0220 02/07/22 0230  NA 133* 133*  --  136 138 140 138  K 4.3 4.3  --  3.9 3.6 3.1* 3.6  CL 100 99  --  103 106 105 105  CO2 23  --   --  '23 22 23 '$ 20*  GLUCOSE 149* 145*  --  119* 112* 113* 114*  BUN 36* 35*  --  32* '22 17 17  '$ CREATININE 1.00 0.90 0.83 0.90 0.70 0.61 0.70  CALCIUM 9.4  --   --  9.2 9.3 9.1 9.2  MG  --   --   --  2.1  --  2.1  --   PHOS  --   --   --  2.7  --   --   --     GFR: Estimated Creatinine Clearance: 41 mL/min (by C-G formula based on SCr of 0.7 mg/dL). Liver Function Tests: Recent Labs  Lab 02/06/2022 1634  AST 19  ALT 10  ALKPHOS 53  BILITOT 0.9  PROT 7.4  ALBUMIN 3.3*    Recent Labs  Lab 01/28/2022 1635  LIPASE 24   Sepsis Labs: Recent Labs  Lab 01/31/22 1055 01/31/22 1231 02/11/2022 1636  LATICACIDVEN 2.0* 1.8 1.1     Recent Results (from the past  240 hour(s))  SARS Coronavirus 2 by RT PCR (hospital order, performed in [facility] hospital lab) *cepheid single result test* Anterior Nasal Swab     Status: None   Collection Time: 01/28/2022  2:25 PM   Specimen: Anterior Nasal Swab  Result Value Ref Range Status   SARS Coronavirus 2 by RT PCR NEGATIVE NEGATIVE Final    Comment: (NOTE) SARS-CoV-2 target nucleic acids are NOT DETECTED.  The SARS-CoV-2 RNA is generally detectable in upper and lower respiratory specimens during the acute phase of infection. The lowest concentration of SARS-CoV-2 viral copies this assay can detect is 250 copies / mL. A negative result does not preclude SARS-CoV-2 infection and should not be used as the sole basis for treatment or other patient management decisions.  A negative result may occur with improper specimen collection / handling, submission of specimen other than nasopharyngeal swab, presence of viral mutation(s) within the areas targeted by this assay, and inadequate number of viral copies (<250 copies / mL). A negative result must be combined with clinical observations, patient history, and epidemiological information.  Fact Sheet for Patients:   https://www.patel.info/  Fact Sheet for Healthcare Providers: https://hall.com/  This test is not yet approved or  cleared by the [place] FDA and has been authorized for detection and/or diagnosis of SARS-CoV-2 by FDA under an Emergency Use Authorization (EUA).  This EUA will remain in effect (meaning this test can be used) for the duration of the COVID-19 declaration under Section 564(b)(1) of the Act, 21 U.S.C. section 360bbb-3(b)(1), unless the authorization is terminated or revoked sooner.  Performed at [facility], [place] [address], [place], [place] [address]   Blood culture (routine x 2)     Status: None (Preliminary result)   Collection Time: 01/18/2022  4:00 PM   Specimen:  BLOOD  Result Value Ref Range Status   Specimen Description   Final    BLOOD BLOOD RIGHT FOREARM Performed at [facility] [address] [place], [place] [address]    Special Requests   Final    BOTTLES DRAWN AEROBIC AND ANAEROBIC Blood Culture results may not be optimal due to an excessive volume of blood received in culture bottles Performed at [facility] [address], [place], [place] [address]    Culture   Final    NO GROWTH 3 DAYS Performed at [facility], [place] [address], [place], [place] [address]    Report Status PENDING  Incomplete  Blood culture (routine x 2)     Status: None (Preliminary result)   Collection Time: 02/07/2022  4:00 PM   Specimen: BLOOD  Result Value Ref Range Status   Specimen Description   Final    BLOOD LEFT ANTECUBITAL Performed at [facility] [address] [place], [place] [address]    Special Requests   Final    BOTTLES DRAWN AEROBIC AND ANAEROBIC Blood Culture adequate volume Performed at [facility] [address] [place], [place] [address]    Culture   Final    NO GROWTH 3 DAYS Performed at [facility], [place] [address], [place],  [address]    Report Status PENDING  Incomplete  MRSA Next Gen by PCR, Nasal     Status: None   Collection Time: 01/19/2022  4:37 PM   Specimen: Nasal Swab  Result Value Ref Range Status   MRSA by PCR Next Gen NOT DETECTED NOT DETECTED Final  Comment: (NOTE) The GeneXpert MRSA Assay (FDA approved for NASAL specimens only), is one component of a comprehensive MRSA colonization surveillance program. It is not intended to diagnose MRSA infection nor to guide or monitor treatment for MRSA infections. Test performance is not FDA approved in patients less than 74 years old. Performed at [facility], [place] [address], [place], [place] [address]     Antimicrobials: Anti-infectives (From  admission, onward)    Start     Dose/Rate Route Frequency Ordered Stop   02/04/22 0600  ceFEPIme (MAXIPIME) 2 g in sodium chloride 0.9 % 100 mL IVPB  Status:  Discontinued        2 g 200 mL/hr over 30 Minutes Intravenous Every 12 hours 02/04/2022 1534 01/27/2022 1541   02/04/22 0400  ceFEPIme (MAXIPIME) 2 g in sodium chloride 0.9 % 100 mL IVPB        2 g 200 mL/hr over 30 Minutes Intravenous Every 12 hours 02/14/2022 2226     01/23/2022 1545  ceFEPIme (MAXIPIME) 2 g in sodium chloride 0.9 % 100 mL IVPB        2 g 200 mL/hr over 30 Minutes Intravenous  Once 02/05/2022 1528 02/08/2022 1636   02/02/2022 1515  vancomycin (VANCOCIN) IVPB 1000 mg/200 mL premix        1,000 mg 200 mL/hr over 60 Minutes Intravenous  Once 02/09/2022 1510 01/28/2022 2037      Culture/Microbiology    Component Value Date/Time   SDES  02/01/2022 1600    BLOOD BLOOD RIGHT FOREARM Performed at [facility], [place] [address], [place], [place] [address]    SDES  01/21/2022 1600    BLOOD LEFT ANTECUBITAL Performed at [facility], [place] [address], [place], [place] [address]    [place]  01/31/2022 1600    BOTTLES DRAWN AEROBIC AND ANAEROBIC Blood Culture results may not be optimal due to an excessive volume of blood received in culture bottles Performed at [facility], [place] [address], [place], [place] [address]    SPECREQUEST  01/29/2022 1600    BOTTLES DRAWN AEROBIC AND ANAEROBIC Blood Culture adequate volume Performed at [facility] [address] [place] [address], [place], [place] [address]    CULT  01/27/2022 1600    NO GROWTH 3 DAYS Performed at [facility] [address], [place], [place] [address]    CULT  02/04/2022 1600    NO GROWTH 3 DAYS Performed at [facility] [address] [place],  [address]    REPTSTATUS PENDING 01/27/2022 1600   REPTSTATUS PENDING 02/06/2022 1600    Other culture-see note  Radiology Studies: DG Abd  Portable 1V  Result Date: 02/07/2022 CLINICAL DATA:  Small bowel obstruction. EXAM: PORTABLE ABDOMEN - 1 VIEW COMPARISON:  February 06, 2022. FINDINGS: Mild amount of contrast is seen in the right colon. No abnormal bowel dilatation is noted. Postsurgical changes are seen in the lumbar spine. IMPRESSION: Mild amount of contrast seen in the right colon. No abnormal bowel dilatation. Electronically Signed   By: Marijo Conception M.D.   On: 02/07/2022 08:06   DG Chest Port 1 View  Result Date: 02/06/2022 CLINICAL DATA:  Shortness of breath EXAM: PORTABLE CHEST 1 VIEW COMPARISON:  Previous studies including the examination of 02/05/2022 FINDINGS: Transverse diameter of heart is increased. Large fixed hiatal hernia is seen. Right hemidiaphragm is elevated. There is blunting of both lateral CP angles suggesting bilateral pleural effusions, more  so on the right side. There are linear densities in the lower lung fields. There are no signs of alveolar pulmonary edema. No new focal infiltrates are seen. Right PICC line is noted with its tip at the junction of superior vena cava and right atrium. Calcification is seen in augmentation prostheses in the breasts. IMPRESSION: Small to moderate bilateral pleural effusions, more so on the right side. There are no signs of pulmonary edema. There are patchy infiltrates in the lower lung fields suggesting atelectasis/pneumonia. Large fixed hiatal hernia. No significant interval changes are noted. Electronically Signed   By: Elmer Picker M.D.   On: 02/06/2022 15:31   DG Abd Portable 1V-Small Bowel Obstruction Protocol-initial, 8 hr delay  Result Date: 02/06/2022 CLINICAL DATA:  Small bowel obstruction, 8 hour delay EXAM: PORTABLE ABDOMEN - 1 VIEW COMPARISON:  02/04/2022 FINDINGS: A single abdominal images obtained 8 hours after administration of oral contrast material. Most of contrast appears to remain in the stomach with suggestion of some contrast in left lower  quadrant small bowel. No contrast is demonstrated in the colon. Changes likely indicate high-grade obstruction. Dilated gas and fluid-filled small bowel consistent with small-bowel obstruction. Small right pleural effusion with basilar atelectasis or consolidation suggested. Postoperative changes in the lumbar spine. IMPRESSION: No contrast material demonstrated in the colon suggesting no evidence of high-grade small bowel obstruction. Electronically Signed   By: Lucienne Capers M.D.   On: 02/06/2022 01:29   DG CHEST PORT 1 VIEW  Result Date: 02/05/2022 CLINICAL DATA:  PICC line placement. EXAM: PORTABLE CHEST 1 VIEW COMPARISON:  Two-view chest x-ray 01/18/2022. FINDINGS: Heart is enlarged. Large hiatal hernia is in noted. Right pleural effusion scratched at right greater than left pleural effusion and asymmetric airspace disease is noted. A new right-sided PICC line is in place. The tip is at the cavoatrial junction. IMPRESSION: 1. New right-sided PICC line with the tip at the cavoatrial junction. No radiographic evidence for complication. 2. Cardiomegaly without failure. 3. Right greater than left pleural effusions and asymmetric airspace disease. Electronically Signed   By: San Morelle M.D.   On: 02/05/2022 16:58     LOS: 4 days   Antonieta Pert, MD [facility]  02/07/2022, 8:26 AM

## 2022-02-07 NOTE — Progress Notes (Signed)
Patient continue to have fluctuating heart rate in A-fib. 110s to 170s Dr. Lupita Leash informed, patient on metoprolol and started back on coreg. Will continue to assess patient.

## 2022-02-08 ENCOUNTER — Inpatient Hospital Stay (HOSPITAL_COMMUNITY): Payer: Medicare (Managed Care)

## 2022-02-08 DIAGNOSIS — R627 Adult failure to thrive: Secondary | ICD-10-CM

## 2022-02-08 DIAGNOSIS — K56609 Unspecified intestinal obstruction, unspecified as to partial versus complete obstruction: Secondary | ICD-10-CM | POA: Diagnosis not present

## 2022-02-08 LAB — GLUCOSE, CAPILLARY
Glucose-Capillary: 111 mg/dL — ABNORMAL HIGH (ref 70–99)
Glucose-Capillary: 113 mg/dL — ABNORMAL HIGH (ref 70–99)
Glucose-Capillary: 114 mg/dL — ABNORMAL HIGH (ref 70–99)
Glucose-Capillary: 120 mg/dL — ABNORMAL HIGH (ref 70–99)
Glucose-Capillary: 138 mg/dL — ABNORMAL HIGH (ref 70–99)
Glucose-Capillary: 98 mg/dL (ref 70–99)

## 2022-02-08 LAB — CBC
HCT: 39.7 % (ref 36.0–46.0)
Hemoglobin: 12.6 g/dL (ref 12.0–15.0)
MCH: 31.6 pg (ref 26.0–34.0)
MCHC: 31.7 g/dL (ref 30.0–36.0)
MCV: 99.5 fL (ref 80.0–100.0)
Platelets: 417 10*3/uL — ABNORMAL HIGH (ref 150–400)
RBC: 3.99 MIL/uL (ref 3.87–5.11)
RDW: 14 % (ref 11.5–15.5)
WBC: 19.1 10*3/uL — ABNORMAL HIGH (ref 4.0–10.5)
nRBC: 0 % (ref 0.0–0.2)

## 2022-02-08 LAB — BASIC METABOLIC PANEL
Anion gap: 14 (ref 5–15)
BUN: 20 mg/dL (ref 8–23)
CO2: 18 mmol/L — ABNORMAL LOW (ref 22–32)
Calcium: 8.8 mg/dL — ABNORMAL LOW (ref 8.9–10.3)
Chloride: 103 mmol/L (ref 98–111)
Creatinine, Ser: 0.76 mg/dL (ref 0.44–1.00)
GFR, Estimated: >60 mL/min (ref >60)
Glucose, Bld: 124 mg/dL — ABNORMAL HIGH (ref 70–99)
Potassium: 3.6 mmol/L (ref 3.5–5.1)
Sodium: 135 mmol/L (ref 135–145)

## 2022-02-08 LAB — CULTURE, BLOOD (ROUTINE X 2)
Culture: NO GROWTH
Culture: NO GROWTH
Special Requests: ADEQUATE

## 2022-02-08 LAB — PROCALCITONIN: Procalcitonin: 0.43 ng/mL

## 2022-02-08 MED ORDER — PANTOPRAZOLE SODIUM 40 MG PO TBEC
40.0000 mg | DELAYED_RELEASE_TABLET | Freq: Every day | ORAL | Status: DC
  Administered 2022-02-09 – 2022-02-10 (×2): 40 mg via ORAL
  Filled 2022-02-08 (×2): qty 1

## 2022-02-08 NOTE — Progress Notes (Signed)
Subjective: Patient with some flatus.  BM 2 days ago.  On CLD, but only drinking water and her request.  No nausea or vomiting.  No abdominal pain.  On 15L HFNC this am.  CXR with pulmonary edema  ROS: See above, otherwise other systems negative  Objective: Vital signs in last 24 hours: Temp:  [97.9 F (36.6 C)-98.9 F (37.2 C)] 98.4 F (36.9 C) (10/24 0432) Pulse Rate:  [68-105] 68 (10/24 0432) Resp:  [18-24] 23 (10/24 0432) BP: (141-149)/(66-103) 141/66 (10/24 0432) SpO2:  [91 %-99 %] 99 % (10/24 0432) FiO2 (%):  [92 %] 92 % (10/24 0928) Last BM Date : 02/07/22  Intake/Output from previous day: 10/23 0701 - 10/24 0700 In: 603.1 [I.V.:403.1; IV Piggyback:200] Out: 750 [Urine:750] Intake/Output this shift: No intake/output data recorded.  PE: Gen: NAD, but NRB in place Lungs: effort is slightly increase.  NRB in place.  Upper airway secretions audible at bedside Abd: soft, NT, ND, +BS  Lab Results:  Recent Labs    02/07/22 0230 02/08/22 0404  WBC 17.3* 19.1*  HGB 11.9* 12.6  HCT 38.6 39.7  PLT 443* 417*   BMET Recent Labs    02/07/22 0230 02/08/22 0404  NA 138 135  K 3.6 3.6  CL 105 103  CO2 20* 18*  GLUCOSE 114* 124*  BUN 17 20  CREATININE 0.70 0.76  CALCIUM 9.2 8.8*   PT/INR No results for input(s): "LABPROT", "INR" in the last 72 hours. CMP     Component Value Date/Time   NA 135 02/08/2022 0404   NA 137 06/10/2016 0000   K 3.6 02/08/2022 0404   CL 103 02/08/2022 0404   CO2 18 (L) 02/08/2022 0404   GLUCOSE 124 (H) 02/08/2022 0404   BUN 20 02/08/2022 0404   BUN 10 06/10/2016 0000   CREATININE 0.76 02/08/2022 0404   CALCIUM 8.8 (L) 02/08/2022 0404   PROT 7.4 01/19/2022 1634   ALBUMIN 3.3 (L) 02/15/2022 1634   AST 19 01/27/2022 1634   ALT 10 02/02/2022 1634   ALKPHOS 53 01/27/2022 1634   BILITOT 0.9 01/27/2022 1634   GFRNONAA >60 02/08/2022 0404   GFRAA >60 04/25/2016 0547   Lipase     Component Value Date/Time   LIPASE 24  02/06/2022 1635       Studies/Results: DG Chest Port 1 View  Result Date: 02/08/2022 CLINICAL DATA:  Shortness of breath EXAM: PORTABLE CHEST 1 VIEW COMPARISON:  Chest x-ray dated February 06, 2022 FINDINGS: Cardiac and mediastinal contours are unchanged. New diffuse bilateral interstitial opacities and small bilateral pleural effusions. Stable position of right arm PICC. No evidence of pneumothorax. IMPRESSION: Pulmonary edema and small bilateral pleural effusions. Electronically Signed   By: Yetta Glassman M.D.   On: 02/08/2022 08:15   DG Abd Portable 1V  Result Date: 02/07/2022 CLINICAL DATA:  Small bowel obstruction. EXAM: PORTABLE ABDOMEN - 1 VIEW COMPARISON:  February 06, 2022. FINDINGS: Mild amount of contrast is seen in the right colon. No abnormal bowel dilatation is noted. Postsurgical changes are seen in the lumbar spine. IMPRESSION: Mild amount of contrast seen in the right colon. No abnormal bowel dilatation. Electronically Signed   By: Marijo Conception M.D.   On: 02/07/2022 08:06   DG Chest Port 1 View  Result Date: 02/06/2022 CLINICAL DATA:  Shortness of breath EXAM: PORTABLE CHEST 1 VIEW COMPARISON:  Previous studies including the examination of 02/05/2022 FINDINGS: Transverse diameter of heart is increased. Large fixed  hiatal hernia is seen. Right hemidiaphragm is elevated. There is blunting of both lateral CP angles suggesting bilateral pleural effusions, more so on the right side. There are linear densities in the lower lung fields. There are no signs of alveolar pulmonary edema. No new focal infiltrates are seen. Right PICC line is noted with its tip at the junction of superior vena cava and right atrium. Calcification is seen in augmentation prostheses in the breasts. IMPRESSION: Small to moderate bilateral pleural effusions, more so on the right side. There are no signs of pulmonary edema. There are patchy infiltrates in the lower lung fields suggesting atelectasis/pneumonia.  Large fixed hiatal hernia. No significant interval changes are noted. Electronically Signed   By: Elmer Picker M.D.   On: 02/06/2022 15:31    Anti-infectives: Anti-infectives (From admission, onward)    Start     Dose/Rate Route Frequency Ordered Stop   02/07/22 1600  metroNIDAZOLE (FLAGYL) IVPB 500 mg        500 mg 100 mL/hr over 60 Minutes Intravenous Every 12 hours 02/07/22 1339     02/04/22 0600  ceFEPIme (MAXIPIME) 2 g in sodium chloride 0.9 % 100 mL IVPB  Status:  Discontinued        2 g 200 mL/hr over 30 Minutes Intravenous Every 12 hours 01/31/2022 1534 01/31/2022 1541   02/04/22 0400  ceFEPIme (MAXIPIME) 2 g in sodium chloride 0.9 % 100 mL IVPB        2 g 200 mL/hr over 30 Minutes Intravenous Every 12 hours 01/31/2022 2226     02/07/2022 1545  ceFEPIme (MAXIPIME) 2 g in sodium chloride 0.9 % 100 mL IVPB        2 g 200 mL/hr over 30 Minutes Intravenous  Once 01/18/2022 1528 02/04/2022 1636   02/14/2022 1515  vancomycin (VANCOCIN) IVPB 1000 mg/200 mL premix        1,000 mg 200 mL/hr over 60 Minutes Intravenous  Once 02/06/2022 1510 01/29/2022 2037        Assessment/Plan SBO -resolving -adv to soft diet.  Patient only wants water, but I informed her that will write for a soft diet so she can eat whatever she would like.   FEN - soft diet VTE - Lovenox ID - cefepime/flagyl  HCAP GERD  I reviewed hospitalist notes, last 24 h vitals and pain scores, last 48 h intake and output, last 24 h labs and trends, and last 24 h imaging results.   LOS: 5 days    Henreitta Cea , [facility] 02/08/2022, 10:11 AM Please see Amion for pager number during day hours 7:00am-4:30pm or 7:00am -11:30am on weekends

## 2022-02-08 NOTE — Progress Notes (Signed)
Progress Note    [name]   XNA:[id number]  DOB: 06-Aug-1932  DOA: 02/10/2022     5 PCP: Berniece Salines, DO  Initial CC: SOB, abd pain  Hospital Course: [name] is an 88 year old female with atrial flutter not on anticoagulation, hypertension, recent back surgery, on chronic high-dose morphine presented with shortness of breath persistent diffuse abdominal pain nausea vomiting and constipation x3 days PTA Seen in the [facility] ED 10/16-diagnosed with pneumonia/pleural effusion discharged back to facility on antibiotics. Due to persistent similar pain presented to [facility] ED 10/19- w/ pleuritic pain worse with deep breath chest x-ray worsening pneumonia. CTA negative for PE, revealed persistent left lower lobe tree-in-bud nodularity suggestive of infection/inflammation, trace bilateral pleural effusion, large hiatal hernia, neuropathy small bowel obstruction query transition point in the mid abdomen. CT abdomen pelvis with contrast revealed partial small bowel obstruction. Surgery was consulted and patient was admitted. Serial imaging was performed and showed contrast movement into the right colon.  Diet was recommended to be advanced as tolerated per surgery.   Interval History:  Patient laying in bed with ongoing respiratory congestion and difficulty with deep inspiration.  Denies much abdominal pain when asked however still not wishing to take in much oral nutrition.  Mostly drinking some water this morning.  Assessment and Plan:  Partial small bowel obstruction: General surgery following, NG tube was planned but patient refused multiple times.02/06/22 most of the contrast in the stomach, some in the left lower quadrant small bowel, no contrast in the colon indicating high-grade obstruction.Repleted electrolytes.NGT was attempted multiple times without success 10/22>X-ray shows mild amount of contrast in the right colon, no abnormal bowel dilatation.   - ADAT per surgery - guarded prognosis  given poor PO intake in general; discussed at length with son on 10/24   Hypokalemia resolved Hypovolemic hyponatremia:resolved.   Community-acquired pneumonia:recently discharged on Augmentin and azithromycin> continuing here on Cefepime.MRSA screen negative.Continue pulmonary toilet and incentive spirometry supplemental oxygen as needed.   Acute hypoxic respiratory failure from pneumonia/chf - d/c IVF given progressive volume overload - hold off on diuresis given poor intake - prognosis starting to worsen   Chronic pain/back pain Recent back surgery wheelchair-bound Chronic opiate use: She is on morphine sulfate 100 mg bid.unable to use p.o.-until then continue IV treatment - need to start considering more GOC discussions if PO intake remains poor   PAF/atypical atrial flutter: rate uncontrolled,unable to use oral Coreg so placed on IV Lopressor 5 mg every 6 hours, monitor on telemetry, resume Coreg once okay to take p.o. followed by Dr. Sherren Mocha cardiology outpatient and has had cardiac monitor and not started on anticoagulation yet> see continue to follow-up for further recommendation   Chronic diastolic RKY:HCWC lvef normal and Gr 1 DD July 2023, BNP trending up,needing IVF for bowel obstruction, watch for fluid overload, monitor daily weight as below-trending down>repeat chest x-ray along with x-ray abdomen after NG tube this am-to watch for any pulm edmea-given tachypnea. -Discontinue fluids.  Hold off on Lasix given poor intake  Elevated troponin 47-40:?Etiology,repeat flat suspect demand ischemia due to acute medical illness   Goals of care: -Prognosis continuing to worsen.  Despite improvement of obstruction, patient still not wishing for much oral intake and now approaching several days without adequate nutrition -Discussed with her son on 02/08/2022 for patient to reconsider either NG tube placement if able given prior difficulty versus conversations regarding pursuit of hospice;  she would not be a good candidate for TPN given progressive decline  Old records reviewed in assessment of this patient  Antimicrobials:   DVT prophylaxis:  enoxaparin (LOVENOX) injection 40 mg Start: 01/21/2022 2200   Code Status:   Code Status: Full Code  Mobility Assessment (last 72 hours)     Mobility Assessment     Row Name 02/08/22 0735 02/07/22 2000 02/07/22 0836 02/06/22 2043 02/06/22 0842   Does patient have an order for bedrest or is patient medically unstable No - Continue assessment No - Continue assessment No - Continue assessment No - Continue assessment No - Continue assessment   What is the highest level of mobility based on the progressive mobility assessment? -- Level 2 (Chairfast) - Balance while sitting on edge of bed and cannot stand Level 2 (Chairfast) - Balance while sitting on edge of bed and cannot stand Level 2 (Chairfast) - Balance while sitting on edge of bed and cannot stand Level 2 (Chairfast) - Balance while sitting on edge of bed and cannot stand   Is the above level different from baseline mobility prior to current illness? -- -- -- No - Consider discontinuing PT/OT --    Row Name 02/05/22 1907           Does patient have an order for bedrest or is patient medically unstable No - Continue assessment                Barriers to discharge:  Disposition Plan:  pending clinical course Status is: Inpt  Objective: Blood pressure 123/61, pulse (!) 48, temperature 98.7 F (37.1 C), temperature source Oral, resp. rate (!) 22, height '5\' 3"'$  (1.6 m), weight 52.8 kg, SpO2 94 %.  Examination:  Physical Exam Constitutional:      Comments: Chronically ill-appearing and very frail/weak elderly woman lying in bed appearing uncomfortable but no obvious distress  HENT:     Head: Normocephalic and atraumatic.     Mouth/Throat:     Mouth: Mucous membranes are moist.  Eyes:     Extraocular Movements: Extraocular movements intact.  Cardiovascular:     Rate  and Rhythm: Normal rate and regular rhythm.  Pulmonary:     Breath sounds: Rhonchi and rales present. No wheezing.  Abdominal:     General: There is no distension.     Palpations: Abdomen is soft.     Comments: Minimal right-sided tenderness without rebound or guarding.  Hypoactive bowel sounds  Musculoskeletal:        General: Normal range of motion.     Cervical back: Normal range of motion.  Skin:    General: Skin is warm.  Neurological:     General: No focal deficit present.  Psychiatric:        Mood and Affect: Mood normal.      Consultants:  General surgery  Procedures:    Data Reviewed: Results for orders placed or performed during the hospital encounter of 01/23/2022 (from the past 24 hour(s))  Urinalysis, Routine w reflex microscopic Urine, Clean Catch     Status: Abnormal   Collection Time: 02/07/22  4:01 PM  Result Value Ref Range   Color, Urine YELLOW YELLOW   APPearance CLEAR CLEAR   Specific Gravity, Urine 1.025 1.005 - 1.030   pH 6.0 5.0 - 8.0   Glucose, UA NEGATIVE NEGATIVE mg/dL   Hgb urine dipstick SMALL (A) NEGATIVE   Bilirubin Urine NEGATIVE NEGATIVE   Ketones, ur 80 (A) NEGATIVE mg/dL   Protein, ur 100 (A) NEGATIVE mg/dL   Nitrite NEGATIVE NEGATIVE   Leukocytes,Ua  NEGATIVE NEGATIVE   RBC / HPF 0-5 0 - 5 RBC/hpf   WBC, UA 0-5 0 - 5 WBC/hpf   Bacteria, UA NONE SEEN NONE SEEN   Squamous Epithelial / LPF 0-5 0 - 5  Glucose, capillary     Status: Abnormal   Collection Time: 02/07/22  4:20 PM  Result Value Ref Range   Glucose-Capillary 108 (H) 70 - 99 mg/dL  Glucose, capillary     Status: Abnormal   Collection Time: 02/07/22  8:13 PM  Result Value Ref Range   Glucose-Capillary 122 (H) 70 - 99 mg/dL  Glucose, capillary     Status: Abnormal   Collection Time: 02/07/22 11:50 PM  Result Value Ref Range   Glucose-Capillary 116 (H) 70 - 99 mg/dL  Basic metabolic panel     Status: Abnormal   Collection Time: 02/08/22  4:04 AM  Result Value Ref Range    Sodium 135 135 - 145 mmol/L   Potassium 3.6 3.5 - 5.1 mmol/L   Chloride 103 98 - 111 mmol/L   CO2 18 (L) 22 - 32 mmol/L   Glucose, Bld 124 (H) 70 - 99 mg/dL   BUN 20 8 - 23 mg/dL   Creatinine, Ser 0.76 0.44 - 1.00 mg/dL   Calcium 8.8 (L) 8.9 - 10.3 mg/dL   GFR, Estimated >60 >60 mL/min   Anion gap 14 5 - 15  CBC     Status: Abnormal   Collection Time: 02/08/22  4:04 AM  Result Value Ref Range   WBC 19.1 (H) 4.0 - 10.5 K/uL   RBC 3.99 3.87 - 5.11 MIL/uL   Hemoglobin 12.6 12.0 - 15.0 g/dL   HCT 39.7 36.0 - 46.0 %   MCV 99.5 80.0 - 100.0 fL   MCH 31.6 26.0 - 34.0 pg   MCHC 31.7 30.0 - 36.0 g/dL   RDW 14.0 11.5 - 15.5 %   Platelets 417 (H) 150 - 400 K/uL   nRBC 0.0 0.0 - 0.2 %  Procalcitonin     Status: None   Collection Time: 02/08/22  4:04 AM  Result Value Ref Range   Procalcitonin 0.43 ng/mL  Glucose, capillary     Status: Abnormal   Collection Time: 02/08/22  4:27 AM  Result Value Ref Range   Glucose-Capillary 111 (H) 70 - 99 mg/dL  Glucose, capillary     Status: Abnormal   Collection Time: 02/08/22  7:45 AM  Result Value Ref Range   Glucose-Capillary 113 (H) 70 - 99 mg/dL  Glucose, capillary     Status: Abnormal   Collection Time: 02/08/22 11:24 AM  Result Value Ref Range   Glucose-Capillary 138 (H) 70 - 99 mg/dL    I have Reviewed nursing notes, Vitals, and Lab results since pt's last encounter. Pertinent lab results : see above I have ordered test including BMP, CBC, Mg I have reviewed the last note from staff over past 24 hours I have discussed pt's care plan and test results with nursing staff, case manager   LOS: 5 days   Dwyane Dee, MD [facility] 02/08/2022, 3:03 PM

## 2022-02-08 NOTE — Hospital Course (Addendum)
Ms. Name is an 88 year old female with atrial flutter not on anticoagulation, hypertension, recent back surgery, on chronic high-dose morphine presented with shortness of breath persistent diffuse abdominal pain nausea vomiting and constipation x3 days PTA.  Seen in the [facility] ED 10/16-diagnosed with pneumonia/pleural effusion discharged back to facility on antibiotics. Due to persistent similar pain presented to [facility] ED 10/19- w/ pleuritic pain worse with deep breath chest x-ray worsening pneumonia. CTA negative for PE, revealed persistent left lower lobe tree-in-bud nodularity suggestive of infection/inflammation, trace bilateral pleural effusion, large hiatal hernia, neuropathy small bowel obstruction query transition point in the mid abdomen.  CT abdomen pelvis with contrast revealed partial small bowel obstruction. Surgery was consulted and patient was admitted. Serial imaging was performed and showed contrast movement into the right colon.  Diet was recommended to be advanced as tolerated per surgery.  Unfortunately she did not advance diet much nor take in adequate nutrition.  She refused attempts of NG tube placement for enteral nutrition.  She had progressive worsening respiratory status requiring increased oxygen demand and transferred to the ICU.  Continuous GOC discussions were held with the patient at bedside who continued to endorse wanting full measures.  Her son was also continuously updated regarding [place] discussions.

## 2022-02-08 NOTE — Progress Notes (Signed)
Palliative-   Chart reviewed. Patient is very clear in her goals for continued aggressive care and full code.  Palliative will sign off for now. Please feel free to re-consult as needed.   Mariana Kaufman, AGNP-C Palliative Medicine  No charge

## 2022-02-08 NOTE — Care Management Important Message (Signed)
Important Message  Patient Details IM Letter given Name: [name] MRN: [id number] Date of Birth: February 28, 1933   Medicare Important Message Given:  Yes     Kerin Salen 02/08/2022, 2:22 PM

## 2022-02-09 DIAGNOSIS — K56609 Unspecified intestinal obstruction, unspecified as to partial versus complete obstruction: Secondary | ICD-10-CM | POA: Diagnosis not present

## 2022-02-09 LAB — MAGNESIUM: Magnesium: 1.6 mg/dL — ABNORMAL LOW (ref 1.7–2.4)

## 2022-02-09 LAB — CBC
HCT: 38.2 % (ref 36.0–46.0)
Hemoglobin: 12 g/dL (ref 12.0–15.0)
MCH: 31.3 pg (ref 26.0–34.0)
MCHC: 31.4 g/dL (ref 30.0–36.0)
MCV: 99.5 fL (ref 80.0–100.0)
Platelets: 378 10*3/uL (ref 150–400)
RBC: 3.84 MIL/uL — ABNORMAL LOW (ref 3.87–5.11)
RDW: 14.1 % (ref 11.5–15.5)
WBC: 17.9 10*3/uL — ABNORMAL HIGH (ref 4.0–10.5)
nRBC: 0 % (ref 0.0–0.2)

## 2022-02-09 LAB — BASIC METABOLIC PANEL
Anion gap: 11 (ref 5–15)
BUN: 24 mg/dL — ABNORMAL HIGH (ref 8–23)
CO2: 20 mmol/L — ABNORMAL LOW (ref 22–32)
Calcium: 8.9 mg/dL (ref 8.9–10.3)
Chloride: 105 mmol/L (ref 98–111)
Creatinine, Ser: 0.66 mg/dL (ref 0.44–1.00)
GFR, Estimated: >60 mL/min (ref >60)
Glucose, Bld: 119 mg/dL — ABNORMAL HIGH (ref 70–99)
Potassium: 3.7 mmol/L (ref 3.5–5.1)
Sodium: 136 mmol/L (ref 135–145)

## 2022-02-09 LAB — GLUCOSE, CAPILLARY
Glucose-Capillary: 114 mg/dL — ABNORMAL HIGH (ref 70–99)
Glucose-Capillary: 116 mg/dL — ABNORMAL HIGH (ref 70–99)
Glucose-Capillary: 130 mg/dL — ABNORMAL HIGH (ref 70–99)
Glucose-Capillary: 148 mg/dL — ABNORMAL HIGH (ref 70–99)
Glucose-Capillary: 149 mg/dL — ABNORMAL HIGH (ref 70–99)

## 2022-02-09 LAB — PROCALCITONIN: Procalcitonin: 0.34 ng/mL

## 2022-02-09 MED ORDER — MORPHINE SULFATE ER 100 MG PO TBCR
100.0000 mg | EXTENDED_RELEASE_TABLET | Freq: Two times a day (BID) | ORAL | Status: DC
  Administered 2022-02-09 – 2022-02-10 (×4): 100 mg via ORAL
  Filled 2022-02-09 (×4): qty 1

## 2022-02-09 MED ORDER — MAGNESIUM SULFATE 2 GM/50ML IV SOLN
2.0000 g | Freq: Once | INTRAVENOUS | Status: AC
  Administered 2022-02-09: 2 g via INTRAVENOUS
  Filled 2022-02-09: qty 50

## 2022-02-09 MED ORDER — HYDROMORPHONE HCL 1 MG/ML IJ SOLN
0.5000 mg | INTRAMUSCULAR | Status: DC | PRN
  Administered 2022-02-09 (×2): 1 mg via INTRAVENOUS
  Administered 2022-02-09: 0.5 mg via INTRAVENOUS
  Administered 2022-02-09 (×3): 1 mg via INTRAVENOUS
  Administered 2022-02-10: 0.5 mg via INTRAVENOUS
  Administered 2022-02-10 – 2022-02-12 (×5): 1 mg via INTRAVENOUS
  Administered 2022-02-12: 0.5 mg via INTRAVENOUS
  Administered 2022-02-13: 1 mg via INTRAVENOUS
  Filled 2022-02-09 (×14): qty 1

## 2022-02-09 NOTE — NC FL2 (Signed)
[place] MEDICAID FL2 LEVEL OF CARE SCREENING TOOL     IDENTIFICATION  Patient Name: [name] Birthdate: 1933-02-14 Sex: female Admission Date (Current Location): 01/30/2022  [place] and [place] Number:  Kathleen Argue  ([id number]) Facility and Address:  [facility],  [place] [address], [place]      Provider Number: [id number]  Attending Physician Name and Address:  Dwyane Dee, MD  Relative Name and Phone Number:   [name] Bingley(son) [phone])    Current Level of Care: Hospital Recommended Level of Care: Nursing Facility Prior Approval Number:    Date Approved/Denied:   PASRR Number:    Discharge Plan: Other (Comment) [facility])    Current Diagnoses: Patient Active Problem List   Diagnosis Date Noted   SBO (small bowel obstruction) ([place]) 02/06/2022   Pneumonia 01/18/2022   Atypical atrial flutter ([place]) 11/22/2021   Primary hypertension 11/22/2021   Atrial fibrillation with rapid ventricular response ([place]) 10/25/2021   AKI (acute kidney injury) ([place]) 10/24/2021   Acute respiratory failure with hypoxia ([place]) 10/24/2021   Paroxysmal atrial fibrillation ([place]) 10/24/2021   Abdominal pain 10/24/2021   GERD (gastroesophageal reflux disease) 10/24/2021   Decubitus ulcer of upper back 06/27/2016   Chronic respiratory failure with hypoxia ([place]) 05/03/2016   Primary osteoarthritis involving multiple joints 05/03/2016   Chronic pain syndrome 05/03/2016   Hypoxia    Sepsis likely from pneumonia/parainfluenza  03/24/2016   Aspiration pneumonia ([place]) 03/24/2016   Protein-calorie malnutrition, severe ([place]) 03/24/2016   Sinus tachycardia 01/27/2014   Colitis 01/24/2014   Iron deficiency anemia 10/23/2012   Benign neoplasm of colon 10/23/2012    Orientation RESPIRATION BLADDER Height & Weight     Self, Time, Situation, Place  Normal Incontinent Weight: 54.8 kg Height:  '5\' 3"'$  (160 cm)  BEHAVIORAL SYMPTOMS/MOOD NEUROLOGICAL BOWEL  NUTRITION STATUS      Incontinent Diet (Soft)  AMBULATORY STATUS COMMUNICATION OF NEEDS Skin   Total Care Verbally Normal                       Personal Care Assistance Level of Assistance  Bathing, Feeding, Dressing Bathing Assistance: Maximum assistance Feeding assistance: Maximum assistance Dressing Assistance: Maximum assistance     Functional Limitations Info  Sight, Hearing, Speech Sight Info: Adequate Hearing Info: Adequate Speech Info: Adequate    SPECIAL CARE FACTORS FREQUENCY                       Contractures Contractures Info: Not present    Additional Factors Info  Code Status, Allergies Code Status Info:  (Full) Allergies Info:  (Ciprofloxacin)           Current Medications (02/09/2022):  This is the current hospital active medication list Current Facility-Administered Medications  Medication Dose Route Frequency Provider Last Rate Last Admin   aspirin EC tablet 81 mg  81 mg Oral Daily Kc, Ramesh, MD   81 mg at 02/08/22 0939   carvedilol (COREG) tablet 6.25 mg  6.25 mg Oral BID Kc, Ramesh, MD   6.25 mg at 02/08/22 2142   ceFEPIme (MAXIPIME) 2 g in sodium chloride 0.9 % 100 mL IVPB  2 g Intravenous Q12H Hall, Carole N, DO 200 mL/hr at 02/09/22 0345 2 g at 02/09/22 0345   Chlorhexidine Gluconate Cloth 2 % PADS 6 each  6 each Topical Daily Antonieta Pert, MD   6 each at 02/08/22 1132   enoxaparin (LOVENOX) injection 40 mg  40 mg Subcutaneous Q24H Irene Pap N, DO   40 mg at 02/08/22 2143   furosemide (LASIX) tablet 20 mg  20 mg Oral Edwena Felty, MD   20 mg at 02/07/22 1619   guaiFENesin (ROBITUSSIN) 100 MG/5ML liquid 5 mL  5 mL Oral Q4H PRN Irene Pap N, DO       HYDROmorphone (DILAUDID) injection 0.5-1 mg  0.5-1 mg Intravenous Q3H PRN Kathryne Eriksson, NP   1 mg at 02/09/22 0912   insulin aspart (novoLOG) injection 0-9 Units  0-9 Units Subcutaneous Q4H Irene Pap N, DO   1 Units at 02/07/22 2027   ipratropium-albuterol (DUONEB) 0.5-2.5 (3)  MG/3ML nebulizer solution 3 mL  3 mL Nebulization Q6H PRN Kc, Maren [place], MD   3 mL at 02/08/22 0117   melatonin tablet 3 mg  3 mg Oral QHS Kc, Maren [place], MD   3 mg at 02/08/22 2142   metoprolol tartrate (LOPRESSOR) injection 2.5 mg  2.5 mg Intravenous Q6H PRN Kc, Maren Beach, MD   2.5 mg at 02/08/22 1130   metroNIDAZOLE (FLAGYL) IVPB 500 mg  500 mg Intravenous Q12H Kc, Maren Beach, MD 100 mL/hr at 02/09/22 0428 500 mg at 02/09/22 0428   morphine (MS CONTIN) 12 hr tablet 30 mg  30 mg Oral Q12H Kc, Maren Beach, MD   30 mg at 02/08/22 2142   Oral care mouth rinse  15 mL Mouth Rinse PRN Antonieta Pert, MD       pantoprazole (PROTONIX) EC tablet 40 mg  40 mg Oral Daily Davian, Wollenberg, RPH       prochlorperazine (COMPAZINE) injection 5 mg  5 mg Intravenous Q6H PRN Irene Pap N, DO   5 mg at 02/08/22 2300   sertraline (ZOLOFT) tablet 25 mg  25 mg Oral Daily Kc, Ramesh, MD   25 mg at 02/08/22 0940   sodium chloride flush (NS) 0.9 % injection 10-40 mL  10-40 mL Intracatheter Q12H Kc, Maren Beach, MD   10 mL at 02/08/22 2143   sodium chloride flush (NS) 0.9 % injection 10-40 mL  10-40 mL Intracatheter PRN Kc, Ramesh, MD       sodium chloride flush (NS) 0.9 % injection 10-40 mL  10-40 mL Intracatheter PRN Kc, Ramesh, MD       umeclidinium bromide (INCRUSE ELLIPTA) 62.5 MCG/ACT 1 puff  1 puff Inhalation Daily Irene Pap N, DO   1 puff at 02/09/22 3419     Discharge Medications: Please see discharge summary for a list of discharge medications.  Relevant Imaging Results:  Relevant Lab Results:   Additional Information [phone], [place], [place]

## 2022-02-09 NOTE — TOC Initial Note (Signed)
Transition of Care [facility]) - Initial/Assessment Note    Patient Details  Name: [name] MRN: [id number] Date of Birth: [birth year]  Transition of Care [facility]) CM/SW Contact:    Dessa Phi, RN Phone Number: 02/09/2022, 11:13 AM  Clinical Narrative: confirmed w/Ronald(son), & [facility] rep [place]-from [facility] to return.                  Expected Discharge Plan: Long Term Nursing Home Barriers to Discharge: Continued Medical Work up   Patient Goals and CMS Choice        Expected Discharge Plan and Services Expected Discharge Plan: [place]                                              Prior Living Arrangements/Services                       Activities of Daily Living Home Assistive Devices/Equipment: Bedside commode/3-in-1, Wheelchair ADL Screening (condition at time of admission) Patient's cognitive ability adequate to safely complete daily activities?: Yes Is the patient deaf or have difficulty hearing?: No Does the patient have difficulty seeing, even when wearing glasses/contacts?: Yes Does the patient have difficulty concentrating, remembering, or making decisions?: Yes Patient able to express need for assistance with ADLs?: Yes Does the patient have difficulty dressing or bathing?: Yes Independently performs ADLs?: No Communication: Independent Dressing (OT): Needs assistance Is this a change from baseline?: Pre-admission baseline Grooming: Needs assistance Is this a change from baseline?: Pre-admission baseline Feeding: Independent Bathing: Needs assistance Is this a change from baseline?: Pre-admission baseline Toileting: Needs assistance Is this a change from baseline?: Pre-admission baseline In/Out Bed: Needs assistance Is this a change from baseline?: Pre-admission baseline Walks in Home: Needs assistance Is this a change from baseline?: Pre-admission baseline Does the patient have difficulty walking or  climbing stairs?: Yes Weakness of Legs: Both Weakness of Arms/Hands: Both  Permission Sought/Granted                  Emotional Assessment              Admission diagnosis:  Small bowel obstruction ([place]) [K56.609] SBO (small bowel obstruction) ([place]) [K56.609] Pneumonia [J18.9] Community acquired pneumonia of left lower lobe of lung [J18.9] Patient Active Problem List   Diagnosis Date Noted   SBO (small bowel obstruction) ([place]) 02/06/2022   Pneumonia 02/11/2022   Atypical atrial flutter ([place]) 11/22/2021   Primary hypertension 11/22/2021   Atrial fibrillation with rapid ventricular response ([place]) 10/25/2021   AKI (acute kidney injury) ([place]) 10/24/2021   Acute respiratory failure with hypoxia ([place]) 10/24/2021   Paroxysmal atrial fibrillation ([place]) 10/24/2021   Abdominal pain 10/24/2021   GERD (gastroesophageal reflux disease) 10/24/2021   Decubitus ulcer of upper back 06/27/2016   Chronic respiratory failure with hypoxia ([place]) 05/03/2016   Primary osteoarthritis involving multiple joints 05/03/2016   Chronic pain syndrome 05/03/2016   Hypoxia    Sepsis likely from pneumonia/parainfluenza  03/24/2016   Aspiration pneumonia ([place]) 03/24/2016   Protein-calorie malnutrition, severe ([place]) 03/24/2016   Sinus tachycardia 01/27/2014   Colitis 01/24/2014   Iron deficiency anemia 10/23/2012   Benign neoplasm of colon 10/23/2012   PCP:  Berniece Salines, DO Pharmacy:   [place], [place] - [address]  [place] [address] Phone: [phone] Fax: [phone]  [place], [place] [address] [place] [address] Phone: [phone] Fax: [phone]     Social Determinants of Health ([place]) Interventions    Readmission Risk Interventions     No data to display

## 2022-02-09 NOTE — Evaluation (Signed)
Occupational Therapy Evaluation Patient Details Name: [name] MRN: [id number] DOB: Jul 13, 1932 Today's Date: 02/09/2022   History of Present Illness 88 year old female with atrial flutter not on anticoagulation, hypertension, recent back surgery, on chronic high-dose morphine presented with shortness of breath persistent diffuse abdominal pain nausea vomiting and constipation x3 days PTA  Seen in the [facility] ED 10/16-diagnosed with pneumonia/pleural effusion discharged back to facility on antibiotics.  Due to persistent similar pain presented to [facility] ED 02/11/2022- w/ pleuritic pain worse with deep breath chest x-ray worsening pneumonia.  CT abdomen pelvis with contrast revealed partial small bowel obstruction   Clinical Impression   Patient required initial encouragement & reinforcement to participate in the session. She is currently presenting below her baseline level of functioning for self-care management, given deconditioning, patient reported new supplemental O2 use currently at 5 L via nasal cannula, abdominal pain, generalized weakness, decreased activity tolerance, and impaired functional mobility. She required mod assist for bed mobility, set-up assist in bed for feeding, and max assist for simulated lower body dressing. She reported having 7/10 abdominal pain at rest, as well as increased dizziness upon sitting edge of bed. She declined to attempt out of bed activity, due to fatigue and feelings of weakness.  Without further OT services, she is at risk for restricted ADL participation and further weakness and deconditioning.      Recommendations for follow up therapy are one component of a multi-disciplinary discharge planning process, led by the attending physician.  Recommendations may be updated based on patient status, additional functional criteria and insurance authorization.   Follow Up Recommendations  Skilled nursing-short term rehab (<3 hours/day)    Assistance Recommended at  Discharge Frequent or constant Supervision/Assistance  Patient can return home with the following A lot of help with walking and/or transfers;A lot of help with bathing/dressing/bathroom;Assistance with cooking/housework    Functional Status Assessment  Patient has had a recent decline in their functional status and demonstrates the ability to make significant improvements in function in a reasonable and predictable amount of time.  Equipment Recommendations  None recommended by OT       Precautions / Restrictions Precautions Precautions: Fall Precaution Comments: currently on 5L O2 Restrictions Weight Bearing Restrictions: No      Mobility Bed Mobility Overal bed mobility: Needs Assistance Bed Mobility: Supine to Sit, Rolling, Sidelying to Sit, Sit to Sidelying Rolling: Mod assist Sidelying to sit: Mod assist     Sit to sidelying: Mod assist General bed mobility comments: assist for log roll technique due to abdominal pain, pt requiring assist for upper and lower body    Transfers        General transfer comment: pt declined to attempt, felt unable      Balance Overall balance assessment: Needs assistance   Sitting balance-Leahy Scale: Fair Sitting balance - Comments: can sit unsupported however leaning to one side due to pain, reports pain and fatigue limiting sitting           ADL either performed or assessed with clinical judgement   ADL   Eating/Feeding: Set up;Bed level Eating/Feeding Details (indicate cue type and reason): She initiated self-feeding in bed Grooming: Bed level;Set up      Upper Body Dressing : Minimal assistance   Lower Body Dressing: Maximal assistance                                Pertinent Vitals/Pain Pain Assessment  Pain Assessment: 0-10 Pain Score: 7  Pain Location: abdominal pain Pain Intervention(s): Limited activity within patient's tolerance, Repositioned     Hand Dominance Right   Extremity/Trunk  Assessment Upper Extremity Assessment Upper Extremity Assessment: Overall WFL for tasks assessed   Lower Extremity Assessment Lower Extremity Assessment: Generalized weakness       Communication     Cognition Arousal/Alertness: Awake/alert Behavior During Therapy: WFL for tasks assessed/performed Overall Cognitive Status: Within Functional Limits for tasks assessed              General Comments: Oriented x4, able to follow 1-2 step commands consistently                Home Living Family/patient expects to be discharged to:: Skilled nursing facility        Additional Comments: [place]      Prior Functioning/Environment Prior Level of Function : Independent/Modified Independent             Mobility Comments: She reported being wheelchair bound, and being able to transfer into and out of the wheelchair, as well as propel it independently. ADLs Comments: She reported being modified independent to independent with all ADLs.        OT Problem List: Decreased strength;Decreased activity tolerance;Impaired balance (sitting and/or standing);Decreased knowledge of use of DME or AE;Pain      OT Treatment/Interventions: Self-care/ADL training;Therapeutic exercise;Neuromuscular education;Energy conservation;DME and/or AE instruction;Patient/family education;Therapeutic activities;Balance training    OT Goals(Current goals can be found in the care plan section) Acute Rehab OT Goals Patient Stated Goal: decreased pain and feel better OT Goal Formulation: With patient Time For Goal Achievement: 02/23/22 Potential to Achieve Goals: Good ADL Goals Pt Will Perform Grooming: sitting;with set-up Pt Will Perform Upper Body Dressing: with set-up;sitting Pt Will Perform Lower Body Dressing: with supervision;sit to/from stand Pt Will Transfer to Toilet: with supervision;stand pivot transfer;squat pivot transfer;bedside commode Pt Will Perform Toileting - Clothing  Manipulation and hygiene: with supervision;sit to/from stand  OT Frequency: Min 2X/week    Co-evaluation PT/OT/SLP Co-Evaluation/Treatment: Yes Reason for Co-Treatment: For patient/therapist safety PT goals addressed during session: Balance;Mobility/safety with mobility OT goals addressed during session: Strengthening/ROM      AM-PAC OT "6 Clicks" Daily Activity     Outcome Measure Help from another person eating meals?: A Little Help from another person taking care of personal grooming?: A Little Help from another person toileting, which includes using toliet, bedpan, or urinal?: A Lot Help from another person bathing (including washing, rinsing, drying)?: A Lot Help from another person to put on and taking off regular upper body clothing?: A Little Help from another person to put on and taking off regular lower body clothing?: A Lot 6 Click Score: 15   End of Session Equipment Utilized During Treatment: Oxygen Nurse Communication: Mobility status  Activity Tolerance: Patient limited by pain Patient left: in bed;with call bell/phone within reach;with bed alarm set  OT Visit Diagnosis: Muscle weakness (generalized) (M62.81)                Time: 5361-4431 OT Time Calculation (min): 17 min Charges:  OT General Charges $OT Visit: 1 Visit OT Evaluation $OT Eval Moderate Complexity: 1 Mod    Jahni Nazar L Kashawn Manzano, OTR/L 02/09/2022, 12:31 PM

## 2022-02-09 NOTE — Progress Notes (Signed)
Progress Note    [name]   EVO:[id number]  DOB: 1932-08-15  DOA: 02/02/2022     6 PCP: Crystal Salines, DO  Initial CC: SOB, abd pain  Hospital Course: [name] is an 88 year old female with atrial flutter not on anticoagulation, hypertension, recent back surgery, on chronic high-dose morphine presented with shortness of breath persistent diffuse abdominal pain nausea vomiting and constipation x3 days PTA Se[facility] 10/16-diagnosed with pneumonia/pleural effusion discharged back to facility on antibiotics. Due to persistent similar pain presented to [facility] ED 10/19- w/ pleuritic pain worse with deep breath chest x-ray worsening pneumonia. CTA negative for PE, revealed persistent left lower lobe tree-in-bud nodularity suggestive of infection/inflammation, trace bilateral pleural effusion, large hiatal hernia, neuropathy small bowel obstruction query transition point in the mid abdomen. CT abdomen pelvis with contrast revealed partial small bowel obstruction. Surgery was consulted and patient was admitted. Serial imaging was performed and showed contrast movement into the right colon.  Diet was recommended to be advanced as tolerated per surgery.   Interval History:  Patient took in only a few bites of applesauce yesterday and small amount of her protein drink.  Appetite remains poor today. Discussed further with son outside room today.  Plan remains to continue monitoring her intake and if she continues to have poor nutrition, she may continue to decline and possibly warrant consideration of pursuing hospice.  However, patient still wishing to remain full code with aggressive measures.  Further discussions to be continued daily.  Currently she is too weak to return back to long-term care.  Assessment and Plan:  Partial small bowel obstruction: General surgery following, NG tube was planned but patient refused multiple times.02/06/22 most of the contrast in the stomach, some in the  left lower quadrant small bowel, no contrast in the colon indicating high-grade obstruction.Repleted electrolytes.NGT was attempted multiple times without success 10/22>X-ray shows mild amount of contrast in the right colon, no abnormal bowel dilatation.   - ADAT per surgery - guarded prognosis given poor PO intake in general; discussed at length with son on 10/24 and 10/25   Hypokalemia resolved Hypovolemic hyponatremia:resolved.   Community-acquired pneumonia:recently discharged on Augmentin and azithromycin> continuing here on Cefepime.MRSA screen negative.Continue pulmonary toilet and incentive spirometry supplemental oxygen as needed.   Acute hypoxic respiratory failure from pneumonia/chf - d/c IVF given progressive volume overload - hold off on diuresis given poor intake - prognosis starting to worsen   Chronic pain/back pain Recent back surgery wheelchair-bound Chronic opiate use: She is on morphine sulfate 100 mg bid.unable to use p.o.-until then continue IV treatment - need to start considering more GOC discussions if PO intake remains poor   PAF/atypical atrial flutter: rate uncontrolled,unable to use oral Coreg so placed on IV Lopressor 5 mg every 6 hours, monitor on telemetry, resume Coreg once okay to take p.o. followed by Dr. Sherren Mocha cardiology outpatient and has had cardiac monitor and not started on anticoagulation yet> see continue to follow-up for further recommendation   Chronic diastolic EXH:BZJI lvef normal and Gr 1 DD July 2023, BNP trending up,needing IVF for bowel obstruction, watch for fluid overload, monitor daily weight as below-trending down>repeat chest x-ray along with x-ray abdomen after NG tube this am-to watch for any pulm edmea-given tachypnea. -Discontinue fluids.  Hold off on Lasix given poor intake  Elevated troponin 47-40:?Etiology,repeat flat suspect demand ischemia due to acute medical illness   Goals of care: -Prognosis continuing to worsen.  Despite  improvement of obstruction,  patient still not wishing for much oral intake and now approaching several days without adequate nutrition -Discussed with her son on 02/08/2022 for patient to reconsider either NG tube placement if able given prior difficulty versus conversations regarding pursuit of hospice; she would not be a good candidate for TPN given progressive decline   Old records reviewed in assessment of this patient  Antimicrobials:   DVT prophylaxis:  enoxaparin (LOVENOX) injection 40 mg Start: 01/16/2022 2200   Code Status:   Code Status: Full Code  Mobility Assessment (last 72 hours)     Mobility Assessment     Row Name 02/09/22 1157 02/09/22 1059 02/08/22 2005 02/08/22 0735 02/07/22 2000   Does patient have an order for bedrest or is patient medically unstable -- -- No - Continue assessment No - Continue assessment No - Continue assessment   What is the highest level of mobility based on the progressive mobility assessment? Level 2 ([place]) - Balance while sitting on edge of bed and cannot stand Level 2 ([place]) - Balance while sitting on edge of bed and cannot stand Level 2 ([place]) - Balance while sitting on edge of bed and cannot stand -- Level 2 ([place]) - Balance while sitting on edge of bed and cannot stand    Row Name 02/07/22 0836 02/06/22 2043         Does patient have an order for bedrest or is patient medically unstable No - Continue assessment No - Continue assessment      What is the highest level of mobility based on the progressive mobility assessment? Level 2 ([place]) - Balance while sitting on edge of bed and cannot stand Level 2 ([place]) - Balance while sitting on edge of bed and cannot stand      Is the above level different from baseline mobility prior to current illness? -- No - Consider discontinuing PT/OT               Barriers to discharge:  Disposition Plan:  pending clinical course Status is: Inpt  Objective: Blood  pressure (!) 137/51, pulse 64, temperature 98.1 F (36.7 C), temperature source Oral, resp. rate (!) 21, height '5\' 3"'$  (1.6 m), weight 54.8 kg, SpO2 93 %.  Examination:  Physical Exam Constitutional:      Comments: Chronically ill-appearing and very frail/weak elderly woman lying in bed appearing uncomfortable but no obvious distress  HENT:     Head: Normocephalic and atraumatic.     Mouth/Throat:     Mouth: Mucous membranes are moist.  Eyes:     Extraocular Movements: Extraocular movements intact.  Cardiovascular:     Rate and Rhythm: Normal rate and regular rhythm.  Pulmonary:     Breath sounds: Rhonchi and rales present. No wheezing.  Abdominal:     General: There is no distension.     Palpations: Abdomen is soft.     Comments: Minimal right-sided tenderness without rebound or guarding.  Hypoactive bowel sounds  Musculoskeletal:        General: Normal range of motion.     Cervical back: Normal range of motion.  Skin:    General: Skin is warm.  Neurological:     General: No focal deficit present.  Psychiatric:        Mood and Affect: Mood normal.      Consultants:  General surgery  Procedures:    Data Reviewed: Results for orders placed or performed during the hospital encounter of 01/20/2022 (from the past 24 hour(s))  Glucose,  capillary     Status: Abnormal   Collection Time: 02/08/22  4:29 PM  Result Value Ref Range   Glucose-Capillary 114 (H) 70 - 99 mg/dL  Glucose, capillary     Status: Abnormal   Collection Time: 02/08/22  8:04 PM  Result Value Ref Range   Glucose-Capillary 120 (H) 70 - 99 mg/dL  Glucose, capillary     Status: None   Collection Time: 02/08/22 11:57 PM  Result Value Ref Range   Glucose-Capillary 98 70 - 99 mg/dL  Basic metabolic panel     Status: Abnormal   Collection Time: 02/09/22  3:07 AM  Result Value Ref Range   Sodium 136 135 - 145 mmol/L   Potassium 3.7 3.5 - 5.1 mmol/L   Chloride 105 98 - 111 mmol/L   CO2 20 (L) 22 - 32 mmol/L    Glucose, Bld 119 (H) 70 - 99 mg/dL   BUN 24 (H) 8 - 23 mg/dL   Creatinine, Ser 0.66 0.44 - 1.00 mg/dL   Calcium 8.9 8.9 - 10.3 mg/dL   GFR, Estimated >60 >60 mL/min   Anion gap 11 5 - 15  CBC     Status: Abnormal   Collection Time: 02/09/22  3:07 AM  Result Value Ref Range   WBC 17.9 (H) 4.0 - 10.5 K/uL   RBC 3.84 (L) 3.87 - 5.11 MIL/uL   Hemoglobin 12.0 12.0 - 15.0 g/dL   HCT 38.2 36.0 - 46.0 %   MCV 99.5 80.0 - 100.0 fL   MCH 31.3 26.0 - 34.0 pg   MCHC 31.4 30.0 - 36.0 g/dL   RDW 14.1 11.5 - 15.5 %   Platelets 378 150 - 400 K/uL   nRBC 0.0 0.0 - 0.2 %  Procalcitonin     Status: None   Collection Time: 02/09/22  3:07 AM  Result Value Ref Range   Procalcitonin 0.34 ng/mL  Magnesium     Status: Abnormal   Collection Time: 02/09/22  3:07 AM  Result Value Ref Range   Magnesium 1.6 (L) 1.7 - 2.4 mg/dL  Glucose, capillary     Status: Abnormal   Collection Time: 02/09/22  4:25 AM  Result Value Ref Range   Glucose-Capillary 114 (H) 70 - 99 mg/dL  Glucose, capillary     Status: Abnormal   Collection Time: 02/09/22  7:54 AM  Result Value Ref Range   Glucose-Capillary 116 (H) 70 - 99 mg/dL  Glucose, capillary     Status: Abnormal   Collection Time: 02/09/22 12:00 PM  Result Value Ref Range   Glucose-Capillary 130 (H) 70 - 99 mg/dL    I have Reviewed nursing notes, Vitals, and Lab results since pt's last encounter. Pertinent lab results : see above I have ordered test including BMP, CBC, Mg I have reviewed the last note from staff over past 24 hours I have discussed pt's care plan and test results with nursing staff, case manager   LOS: 6 days   Dwyane Dee, MD [facility] 02/09/2022, 3:16 PM

## 2022-02-09 NOTE — Evaluation (Signed)
Physical Therapy Evaluation Patient Details Name: [name] MRN: [id number] DOB: April 07, 1933 Today's Date: 02/09/2022  History of Present Illness  88 year old female with atrial flutter not on anticoagulation, hypertension, recent back surgery, on chronic high-dose morphine presented with shortness of breath persistent diffuse abdominal pain nausea vomiting and constipation x3 days PTA  Seen in the [facility] ED 10/16-diagnosed with pneumonia/pleural effusion discharged back to facility on antibiotics.  Due to persistent similar pain presented to [facility] ED 02/08/2022- w/ pleuritic pain worse with deep breath chest x-ray worsening pneumonia.  CT abdomen pelvis with contrast revealed partial small bowel obstruction  Clinical Impression  Pt admitted with above diagnosis.  Pt currently with functional limitations due to the deficits listed below (see PT Problem List). Pt will benefit from skilled PT to increase their independence and safety with mobility to allow discharge to the venue listed below.  Pt requiring increased encouragement to participate due to reports of abdominal pain however eventually agreeable as therapist listed benefits and provided encouragement.  Pt requiring assist to sit EOB and felt unable to remain EOB longer then a couple minutes.  Pt assisted back to supine and provide pillows for repositioning (especially floating heels) as pt reports not being OOB in days.  Pt typically able to modified independently use w/c at her facility.  Pt encouraged to frequently reposition in bed to prevent pressure injuries.  Pt may need more assist upon d/c so recommend SNF.        Recommendations for follow up therapy are one component of a multi-disciplinary discharge planning process, led by the attending physician.  Recommendations may be updated based on patient status, additional functional criteria and insurance authorization.  Follow Up Recommendations Skilled nursing-short term rehab (<3  hours/day) Can patient physically be transported by private vehicle: No    Assistance Recommended at Discharge PRN  Patient can return home with the following  A little help with walking and/or transfers;Assistance with cooking/housework    Equipment Recommendations None recommended by PT  Recommendations for Other Services       Functional Status Assessment Patient has had a recent decline in their functional status and demonstrates the ability to make significant improvements in function in a reasonable and predictable amount of time.     Precautions / Restrictions Precautions Precautions: Fall Precaution Comments: currently on 5L O2 Restrictions Weight Bearing Restrictions: No      Mobility  Bed Mobility Overal bed mobility: Needs Assistance Bed Mobility: Supine to Sit, Rolling, Sidelying to Sit, Sit to Sidelying Rolling: Mod assist Sidelying to sit: Mod assist     Sit to sidelying: Mod assist General bed mobility comments: assist for log roll technique due to abdominal pain, pt requiring assist for upper and lower body    Transfers                   General transfer comment: pt declined to attempt, felt unable    Ambulation/Gait                  Stairs            Wheelchair Mobility    Modified Rankin (Stroke Patients Only)       Balance Overall balance assessment: Needs assistance Sitting-balance support: No upper extremity supported, Feet supported Sitting balance-Leahy Scale: Fair Sitting balance - Comments: can sit unsupported however leaning to one side due to pain, reports pain and fatigue limiting sitting  Pertinent Vitals/Pain Pain Assessment Pain Assessment: 0-10 Pain Score: 7  Pain Location: abdominal pain Pain Descriptors / Indicators: Sore, Grimacing Pain Intervention(s): Repositioned, Monitored during session, Patient requesting pain meds-RN notified    Home  Living Family/patient expects to be discharged to:: Skilled nursing facility                   Additional Comments: [place]    Prior Function Prior Level of Function : Independent/Modified Independent             Mobility Comments: She reported being wheelchair bound, and being able to transfer into and out of the wheelchair, as well as propel it independently. ADLs Comments: She reported being modified independent to independent with all ADLs.     Hand Dominance   Dominant Hand: Right    Extremity/Trunk Assessment   Upper Extremity Assessment Upper Extremity Assessment: Overall WFL for tasks assessed    Lower Extremity Assessment Lower Extremity Assessment: Generalized weakness    Cervical / Trunk Assessment Cervical / Trunk Assessment: Kyphotic  Communication   Communication: No difficulties  Cognition Arousal/Alertness: Awake/alert Behavior During Therapy: WFL for tasks assessed/performed Overall Cognitive Status: Within Functional Limits for tasks assessed                                          General Comments      Exercises     Assessment/Plan    PT Assessment Patient needs continued PT services  PT Problem List Decreased strength;Decreased mobility;Decreased balance;Decreased activity tolerance       PT Treatment Interventions DME instruction;Therapeutic exercise;Balance training;Functional mobility training;Therapeutic activities;Patient/family education;Wheelchair mobility training    PT Goals (Current goals can be found in the Care Plan section)  Acute Rehab PT Goals PT Goal Formulation: With patient Time For Goal Achievement: 02/23/22 Potential to Achieve Goals: Good    Frequency Min 2X/week     Co-evaluation PT/OT/SLP Co-Evaluation/Treatment: Yes Reason for Co-Treatment: For patient/therapist safety;To address functional/ADL transfers PT goals addressed during session: Mobility/safety with mobility OT  goals addressed during session: Strengthening/ROM       AM-PAC PT "6 Clicks" Mobility  Outcome Measure Help needed turning from your back to your side while in a flat bed without using bedrails?: A Lot Help needed moving from lying on your back to sitting on the side of a flat bed without using bedrails?: A Lot Help needed moving to and from a bed to a chair (including a wheelchair)?: Total Help needed standing up from a chair using your arms (e.g., wheelchair or bedside chair)?: Total Help needed to walk in hospital room?: Total Help needed climbing 3-5 steps with a railing? : Total 6 Click Score: 8    End of Session Equipment Utilized During Treatment: Oxygen Activity Tolerance: Patient limited by fatigue Patient left: in bed;with call bell/phone within reach Nurse Communication: Mobility status;Patient requests pain meds PT Visit Diagnosis: Other abnormalities of gait and mobility (R26.89);Muscle weakness (generalized) (M62.81)    Time: 1443-1540 PT Time Calculation (min) (ACUTE ONLY): 17 min   Charges:   PT Evaluation $PT Eval Low Complexity: 1 Low        Kati PT, DPT Physical Therapist Acute Rehabilitation Services Preferred contact method: Secure Chat Weekend Pager Only: [phone] Office: [place] 02/09/2022, 1:09 PM

## 2022-02-10 ENCOUNTER — Inpatient Hospital Stay (HOSPITAL_COMMUNITY): Payer: Medicare (Managed Care)

## 2022-02-10 DIAGNOSIS — R627 Adult failure to thrive: Secondary | ICD-10-CM | POA: Diagnosis not present

## 2022-02-10 DIAGNOSIS — Z7189 Other specified counseling: Secondary | ICD-10-CM | POA: Diagnosis not present

## 2022-02-10 DIAGNOSIS — K56609 Unspecified intestinal obstruction, unspecified as to partial versus complete obstruction: Secondary | ICD-10-CM | POA: Diagnosis not present

## 2022-02-10 LAB — BASIC METABOLIC PANEL
Anion gap: 9 (ref 5–15)
BUN: 28 mg/dL — ABNORMAL HIGH (ref 8–23)
CO2: 21 mmol/L — ABNORMAL LOW (ref 22–32)
Calcium: 9.6 mg/dL (ref 8.9–10.3)
Chloride: 110 mmol/L (ref 98–111)
Creatinine, Ser: 0.65 mg/dL (ref 0.44–1.00)
GFR, Estimated: >60 mL/min (ref >60)
Glucose, Bld: 124 mg/dL — ABNORMAL HIGH (ref 70–99)
Potassium: 3.4 mmol/L — ABNORMAL LOW (ref 3.5–5.1)
Sodium: 140 mmol/L (ref 135–145)

## 2022-02-10 LAB — GLUCOSE, CAPILLARY
Glucose-Capillary: 125 mg/dL — ABNORMAL HIGH (ref 70–99)
Glucose-Capillary: 129 mg/dL — ABNORMAL HIGH (ref 70–99)
Glucose-Capillary: 137 mg/dL — ABNORMAL HIGH (ref 70–99)
Glucose-Capillary: 141 mg/dL — ABNORMAL HIGH (ref 70–99)
Glucose-Capillary: 153 mg/dL — ABNORMAL HIGH (ref 70–99)
Glucose-Capillary: 157 mg/dL — ABNORMAL HIGH (ref 70–99)

## 2022-02-10 LAB — CBC WITH DIFFERENTIAL/PLATELET
Abs Immature Granulocytes: 0.1 10*3/uL — ABNORMAL HIGH (ref 0.00–0.07)
Basophils Absolute: 0.1 10*3/uL (ref 0.0–0.1)
Basophils Relative: 1 %
Eosinophils Absolute: 0 10*3/uL (ref 0.0–0.5)
Eosinophils Relative: 0 %
HCT: 41.1 % (ref 36.0–46.0)
Hemoglobin: 13.1 g/dL (ref 12.0–15.0)
Immature Granulocytes: 1 %
Lymphocytes Relative: 3 %
Lymphs Abs: 0.6 10*3/uL — ABNORMAL LOW (ref 0.7–4.0)
MCH: 31 pg (ref 26.0–34.0)
MCHC: 31.9 g/dL (ref 30.0–36.0)
MCV: 97.2 fL (ref 80.0–100.0)
Monocytes Absolute: 0.9 10*3/uL (ref 0.1–1.0)
Monocytes Relative: 5 %
Neutro Abs: 17.1 10*3/uL — ABNORMAL HIGH (ref 1.7–7.7)
Neutrophils Relative %: 90 %
Platelets: 458 10*3/uL — ABNORMAL HIGH (ref 150–400)
RBC: 4.23 MIL/uL (ref 3.87–5.11)
RDW: 14 % (ref 11.5–15.5)
WBC: 18.7 10*3/uL — ABNORMAL HIGH (ref 4.0–10.5)
nRBC: 0 % (ref 0.0–0.2)

## 2022-02-10 LAB — PROCALCITONIN: Procalcitonin: 0.43 ng/mL

## 2022-02-10 LAB — MAGNESIUM: Magnesium: 2 mg/dL (ref 1.7–2.4)

## 2022-02-10 NOTE — Progress Notes (Signed)
Attempted to assist patient with eating and drinking.  Patient continues to refuse all attempts.  Has only eaten small bites of applesauce and small sips of water with medications.

## 2022-02-10 NOTE — Progress Notes (Signed)
Progress Note    [name]   WUJ:[id number]  DOB: 1932-08-23  DOA: 01/27/2022     7 PCP: Berniece Salines, DO  Initial CC: SOB, abd pain  Hospital Course: [name] is an 88 year old female with atrial flutter not on anticoagulation, hypertension, recent back surgery, on chronic high-dose morphine presented with shortness of breath persistent diffuse abdominal pain nausea vomiting and constipation x3 days PTA Seen in the [facility] ED 10/16-diagnosed with pneumonia/pleural effusion discharged back to facility on antibiotics. Due to persistent similar pain presented to [facility] ED 10/19- w/ pleuritic pain worse with deep breath chest x-ray worsening pneumonia. CTA negative for PE, revealed persistent left lower lobe tree-in-bud nodularity suggestive of infection/inflammation, trace bilateral pleural effusion, large hiatal hernia, neuropathy small bowel obstruction query transition point in the mid abdomen. CT abdomen pelvis with contrast revealed partial small bowel obstruction. Surgery was consulted and patient was admitted. Serial imaging was performed and showed contrast movement into the right colon.  Diet was recommended to be advanced as tolerated per surgery.   Interval History:  No events overnight.  Still not taking in much nutrition which her and I discussed this morning.  She was then asking for assistance with feeding however even after assistance today, she still did not eat much. We also discussed CODE STATUS but she is still very distraught about thinking about end-of-life and making decisions regarding this.  She states she plans to continue thinking further about it. She continues to say she would want resuscitation "for a little bit".  Assessment and Plan:  Partial small bowel obstruction: General surgery following, NG tube was planned but patient refused multiple times.02/06/22 most of the contrast in the stomach, some in the left lower quadrant small bowel, no contrast in the colon  indicating high-grade obstruction.Repleted electrolytes.NGT was attempted multiple times without success 10/22>X-ray shows mild amount of contrast in the right colon, no abnormal bowel dilatation.   - ADAT per surgery - guarded prognosis given poor PO intake in general; discussed at length with son on 10/24 and 10/25 -She continues to have very poor intake and will continue to decline if she continues on this path; she's still against NGT/TF as well; she asked for assistance feeding but still refused eating much even when offered with assistance    Hypokalemia resolved Hypovolemic hyponatremia:resolved.   Community-acquired pneumonia:recently discharged on Augmentin and azithromycin> continuing here on Cefepime.MRSA screen negative.Continue pulmonary toilet and incentive spirometry supplemental oxygen as needed.   Acute hypoxic respiratory failure from pneumonia/chf - d/c IVF given progressive volume overload - hold off on diuresis given poor intake - prognosis now considered poor at this rate   Chronic pain/back pain Recent back surgery wheelchair-bound Chronic opiate use: She is on morphine sulfate 100 mg bid.unable to use p.o.-until then continue IV treatment - need to start considering more GOC discussions if PO intake remains poor   PAF/atypical atrial flutter: rate uncontrolled,unable to use oral Coreg so placed on IV Lopressor 5 mg every 6 hours, monitor on telemetry, resume Coreg once okay to take p.o. followed by Dr. Sherren Mocha cardiology outpatient and has had cardiac monitor and not started on anticoagulation yet> see continue to follow-up for further recommendation   Chronic diastolic NFA:OZHY lvef normal and Gr 1 DD July 2023, BNP trending up,needing IVF for bowel obstruction, watch for fluid overload, monitor daily weight as below-trending down>repeat chest x-ray along with x-ray abdomen after NG tube this am-to watch for any pulm edmea-given tachypnea. -Discontinue fluids.  Hold off  on Lasix given poor intake  Elevated troponin 47-40:?Etiology,repeat flat suspect demand ischemia due to acute medical illness   Goals of care: -Prognosis continuing to worsen.  Despite improvement of obstruction, patient still not wishing for much oral intake and now approaching several days without adequate nutrition -Discussed with her son on 02/08/2022 for patient to reconsider either NG tube placement if able given prior difficulty versus conversations regarding pursuit of hospice; she would not be a good candidate for TPN given progressive decline   Old records reviewed in assessment of this patient  Antimicrobials:   DVT prophylaxis:  enoxaparin (LOVENOX) injection 40 mg Start: 01/21/2022 2200   Code Status:   Code Status: Full Code  Mobility Assessment (last 72 hours)     Mobility Assessment     Row Name 02/09/22 2015 02/09/22 1157 02/09/22 1059 02/08/22 2005 02/08/22 0735   Does patient have an order for bedrest or is patient medically unstable No - Continue assessment -- -- No - Continue assessment No - Continue assessment   What is the highest level of mobility based on the progressive mobility assessment? Level 1 (Bedfast) - Unable to balance while sitting on edge of bed Level 2 (Chairfast) - Balance while sitting on edge of bed and cannot stand Level 2 (Chairfast) - Balance while sitting on edge of bed and cannot stand Level 2 (Chairfast) - Balance while sitting on edge of bed and cannot stand --   Is the above level different from baseline mobility prior to current illness? Yes - Recommend PT order -- -- -- --    [place] Name 02/07/22 2000           Does patient have an order for bedrest or is patient medically unstable No - Continue assessment       What is the highest level of mobility based on the progressive mobility assessment? Level 2 (Chairfast) - Balance while sitting on edge of bed and cannot stand                Barriers to discharge:  Disposition Plan:   pending clinical course; prognosis poor Status is: Inpt  Objective: Blood pressure 111/78, pulse (!) 101, temperature 98 F (36.7 C), resp. rate 18, height '5\' 3"'$  (1.6 m), weight 54.4 kg, SpO2 91 %.  Examination:  Physical Exam Constitutional:      Comments: Chronically ill-appearing and very frail/weak elderly woman lying in bed appearing uncomfortable but no obvious distress  HENT:     Head: Normocephalic and atraumatic.     Mouth/Throat:     Mouth: Mucous membranes are moist.  Eyes:     Extraocular Movements: Extraocular movements intact.  Cardiovascular:     Rate and Rhythm: Normal rate and regular rhythm.  Pulmonary:     Breath sounds: Rhonchi and rales present. No wheezing.  Abdominal:     General: There is no distension.     Palpations: Abdomen is soft.     Comments: Minimal right-sided tenderness without rebound or guarding.  Hypoactive bowel sounds  Musculoskeletal:        General: Normal range of motion.     Cervical back: Normal range of motion.  Skin:    General: Skin is warm.  Neurological:     General: No focal deficit present.  Psychiatric:        Mood and Affect: Mood normal.      Consultants:  General surgery  Procedures:    Data Reviewed: Results for orders  placed or performed during the hospital encounter of 01/22/2022 (from the past 24 hour(s))  Glucose, capillary     Status: Abnormal   Collection Time: 02/09/22  8:30 PM  Result Value Ref Range   Glucose-Capillary 149 (H) 70 - 99 mg/dL  Glucose, capillary     Status: Abnormal   Collection Time: 02/10/22 12:17 AM  Result Value Ref Range   Glucose-Capillary 153 (H) 70 - 99 mg/dL  Magnesium     Status: None   Collection Time: 02/10/22  2:47 AM  Result Value Ref Range   Magnesium 2.0 1.7 - 2.4 mg/dL  Procalcitonin     Status: None   Collection Time: 02/10/22  2:47 AM  Result Value Ref Range   Procalcitonin 0.43 ng/mL  CBC with Differential/Platelet     Status: Abnormal   Collection Time:  02/10/22  2:47 AM  Result Value Ref Range   WBC 18.7 (H) 4.0 - 10.5 K/uL   RBC 4.23 3.87 - 5.11 MIL/uL   Hemoglobin 13.1 12.0 - 15.0 g/dL   HCT 41.1 36.0 - 46.0 %   MCV 97.2 80.0 - 100.0 fL   MCH 31.0 26.0 - 34.0 pg   MCHC 31.9 30.0 - 36.0 g/dL   RDW 14.0 11.5 - 15.5 %   Platelets 458 (H) 150 - 400 K/uL   nRBC 0.0 0.0 - 0.2 %   Neutrophils Relative % 90 %   Neutro Abs 17.1 (H) 1.7 - 7.7 K/uL   Lymphocytes Relative 3 %   Lymphs Abs 0.6 (L) 0.7 - 4.0 K/uL   Monocytes Relative 5 %   Monocytes Absolute 0.9 0.1 - 1.0 K/uL   Eosinophils Relative 0 %   Eosinophils Absolute 0.0 0.0 - 0.5 K/uL   Basophils Relative 1 %   Basophils Absolute 0.1 0.0 - 0.1 K/uL   Immature Granulocytes 1 %   Abs Immature Granulocytes 0.10 (H) 0.00 - 0.07 K/uL  Basic metabolic panel     Status: Abnormal   Collection Time: 02/10/22  2:47 AM  Result Value Ref Range   Sodium 140 135 - 145 mmol/L   Potassium 3.4 (L) 3.5 - 5.1 mmol/L   Chloride 110 98 - 111 mmol/L   CO2 21 (L) 22 - 32 mmol/L   Glucose, Bld 124 (H) 70 - 99 mg/dL   BUN 28 (H) 8 - 23 mg/dL   Creatinine, Ser 0.65 0.44 - 1.00 mg/dL   Calcium 9.6 8.9 - 10.3 mg/dL   GFR, Estimated >60 >60 mL/min   Anion gap 9 5 - 15  Glucose, capillary     Status: Abnormal   Collection Time: 02/10/22  4:15 AM  Result Value Ref Range   Glucose-Capillary 157 (H) 70 - 99 mg/dL  Glucose, capillary     Status: Abnormal   Collection Time: 02/10/22  7:22 AM  Result Value Ref Range   Glucose-Capillary 125 (H) 70 - 99 mg/dL  Glucose, capillary     Status: Abnormal   Collection Time: 02/10/22 11:33 AM  Result Value Ref Range   Glucose-Capillary 141 (H) 70 - 99 mg/dL  Glucose, capillary     Status: Abnormal   Collection Time: 02/10/22  4:21 PM  Result Value Ref Range   Glucose-Capillary 129 (H) 70 - 99 mg/dL    I have Reviewed nursing notes, Vitals, and Lab results since pt's last encounter. Pertinent lab results : see above I have ordered test including BMP, CBC,  Mg I have reviewed the last note from  staff over past 24 hours I have discussed pt's care plan and test results with nursing staff, case manager   LOS: 7 days   Dwyane Dee, MD [facility] 02/10/2022, 5:30 PM

## 2022-02-11 ENCOUNTER — Inpatient Hospital Stay (HOSPITAL_COMMUNITY): Payer: Medicare (Managed Care)

## 2022-02-11 DIAGNOSIS — R627 Adult failure to thrive: Secondary | ICD-10-CM | POA: Diagnosis not present

## 2022-02-11 DIAGNOSIS — K56609 Unspecified intestinal obstruction, unspecified as to partial versus complete obstruction: Secondary | ICD-10-CM | POA: Diagnosis not present

## 2022-02-11 DIAGNOSIS — I5033 Acute on chronic diastolic (congestive) heart failure: Secondary | ICD-10-CM

## 2022-02-11 DIAGNOSIS — R579 Shock, unspecified: Secondary | ICD-10-CM

## 2022-02-11 DIAGNOSIS — J690 Pneumonitis due to inhalation of food and vomit: Secondary | ICD-10-CM

## 2022-02-11 LAB — CBC WITH DIFFERENTIAL/PLATELET
Abs Immature Granulocytes: 0.14 10*3/uL — ABNORMAL HIGH (ref 0.00–0.07)
Basophils Absolute: 0.1 10*3/uL (ref 0.0–0.1)
Basophils Relative: 0 %
Eosinophils Absolute: 0 10*3/uL (ref 0.0–0.5)
Eosinophils Relative: 0 %
HCT: 37.6 % (ref 36.0–46.0)
Hemoglobin: 11.6 g/dL — ABNORMAL LOW (ref 12.0–15.0)
Immature Granulocytes: 1 %
Lymphocytes Relative: 5 %
Lymphs Abs: 1.1 10*3/uL (ref 0.7–4.0)
MCH: 30.9 pg (ref 26.0–34.0)
MCHC: 30.9 g/dL (ref 30.0–36.0)
MCV: 100 fL (ref 80.0–100.0)
Monocytes Absolute: 1.2 10*3/uL — ABNORMAL HIGH (ref 0.1–1.0)
Monocytes Relative: 5 %
Neutro Abs: 20.3 10*3/uL — ABNORMAL HIGH (ref 1.7–7.7)
Neutrophils Relative %: 89 %
Platelets: 432 10*3/uL — ABNORMAL HIGH (ref 150–400)
RBC: 3.76 MIL/uL — ABNORMAL LOW (ref 3.87–5.11)
RDW: 14.1 % (ref 11.5–15.5)
WBC: 22.8 10*3/uL — ABNORMAL HIGH (ref 4.0–10.5)
nRBC: 0 % (ref 0.0–0.2)

## 2022-02-11 LAB — BASIC METABOLIC PANEL
Anion gap: 13 (ref 5–15)
BUN: 44 mg/dL — ABNORMAL HIGH (ref 8–23)
CO2: 20 mmol/L — ABNORMAL LOW (ref 22–32)
Calcium: 9.4 mg/dL (ref 8.9–10.3)
Chloride: 105 mmol/L (ref 98–111)
Creatinine, Ser: 1.1 mg/dL — ABNORMAL HIGH (ref 0.44–1.00)
GFR, Estimated: 49 mL/min — ABNORMAL LOW (ref >60)
Glucose, Bld: 129 mg/dL — ABNORMAL HIGH (ref 70–99)
Potassium: 3.5 mmol/L (ref 3.5–5.1)
Sodium: 138 mmol/L (ref 135–145)

## 2022-02-11 LAB — GLUCOSE, CAPILLARY
Glucose-Capillary: 129 mg/dL — ABNORMAL HIGH (ref 70–99)
Glucose-Capillary: 131 mg/dL — ABNORMAL HIGH (ref 70–99)
Glucose-Capillary: 139 mg/dL — ABNORMAL HIGH (ref 70–99)
Glucose-Capillary: 142 mg/dL — ABNORMAL HIGH (ref 70–99)
Glucose-Capillary: 152 mg/dL — ABNORMAL HIGH (ref 70–99)
Glucose-Capillary: 155 mg/dL — ABNORMAL HIGH (ref 70–99)
Glucose-Capillary: 162 mg/dL — ABNORMAL HIGH (ref 70–99)

## 2022-02-11 LAB — PROCALCITONIN: Procalcitonin: 0.74 ng/mL

## 2022-02-11 LAB — MAGNESIUM: Magnesium: 2.1 mg/dL (ref 1.7–2.4)

## 2022-02-11 MED ORDER — NOREPINEPHRINE 4 MG/250ML-% IV SOLN
0.0000 ug/min | INTRAVENOUS | Status: DC
  Administered 2022-02-11: 2 ug/min via INTRAVENOUS
  Administered 2022-02-12: 4 ug/min via INTRAVENOUS
  Administered 2022-02-13: 7 ug/min via INTRAVENOUS
  Filled 2022-02-11 (×3): qty 250

## 2022-02-11 MED ORDER — PANTOPRAZOLE SODIUM 40 MG IV SOLR
40.0000 mg | Freq: Every day | INTRAVENOUS | Status: DC
  Administered 2022-02-12: 40 mg via INTRAVENOUS
  Filled 2022-02-11: qty 10

## 2022-02-11 MED ORDER — FUROSEMIDE 10 MG/ML IJ SOLN
60.0000 mg | Freq: Once | INTRAMUSCULAR | Status: AC
  Administered 2022-02-11: 60 mg via INTRAVENOUS
  Filled 2022-02-11: qty 6

## 2022-02-11 MED ORDER — ONDANSETRON HCL 4 MG/2ML IJ SOLN: 4.0000 mg | Freq: Four times a day (QID) | INTRAMUSCULAR | Status: DC | PRN

## 2022-02-11 NOTE — Progress Notes (Signed)
Pt found on 6lpm [place] sats 81% hr 63, very gurgly, RT orally suctioned and encouraged pt to cough, RT suctioned copious dark green secretions, pt sats did not improve, pt placed on 15lpm hfnc, sats recovered to 91%, pt still very gurgly sounding. Pt kept insisting that she be left alone. RN was made aware and is currently at bedside

## 2022-02-11 NOTE — Progress Notes (Signed)
0.'5mg'$  IV Dilaudid syringe found in room on computer during transfer from 1401 to ICU by primary nurse and rapid response nurse. Wasted IV Dilaudid in stericycle with Tye Savoy, RN. Pharmacy made aware.

## 2022-02-11 NOTE — Progress Notes (Signed)
Patient found to be pale, diaphoretic increased work of breathing with RR 26-28, O2 sat 81 % 5lt [place]. RT at bedside and assisted with stabilizing the pt. She was placed on 15 lt HFNC, o2 sat now 91%. Pt also had episode of emesis (unable to measure). Emesis is dark green in color. Provider paged and arrived at bedside. Pts son contacted for update by provider.

## 2022-02-11 NOTE — Progress Notes (Signed)
PT Cancellation Note  Patient Details Name: HAFSAH HENDLER MRN: [id number] DOB: [birth year]   Cancelled Treatment:    Reason Eval/Treat Not Completed: Medical issues which prohibited therapy (Pt transferred to ICU this morning. PT will follow up as schedule allows.)  Festus Barren., PT, DPT  Acute Rehabilitation Services  Office [phone]  02/11/2022, 1:00 PM

## 2022-02-11 NOTE — TOC Progression Note (Signed)
Transition of Care [facility]) - Progression Note    Patient Details  Name: [name] MRN: [id number] Date of Birth: February 20, 1933  Transition of Care [facility]) CM/SW Contact  Meko Masterson, Juliann Pulse, RN Phone Number: 02/11/2022, 1:27 PM  Clinical Narrative: Noted in stepdown unit. Continue to follow  for return back to [facility] if appropriate.     Expected Discharge Plan: Long Term Nursing Home Barriers to Discharge: Continued Medical Work up  Expected Discharge Plan and Services Expected Discharge Plan: [place]                                               Social Determinants of Health (SDOH) Interventions    Readmission Risk Interventions    02/09/2022   11:14 AM  Readmission Risk Prevention Plan  Transportation Screening Complete  PCP or Specialist Appt within 3-5 Days Complete  HRI or [place] Complete  Social Work Consult for [place] Planning/Counseling Complete  Palliative Care Screening Complete  Medication Review [employer]) Complete

## 2022-02-11 NOTE — Progress Notes (Signed)
Progress Note    [name]   TSV:[id number]  DOB: [birth year]  DOA: 01/26/2022     8 PCP: Crystal Salines, DO  Initial CC: SOB, abd pain  Hospital Course: [name] is an 88 year old female with atrial flutter not on anticoagulation, hypertension, recent back surgery, on chronic high-dose morphine presented with shortness of breath persistent diffuse abdominal pain nausea vomiting and constipation x3 days PTA.  Seen in the [facility] ED 10/16-diagnosed with pneumonia/pleural effusion discharged back to facility on antibiotics. Due to persistent similar pain presented to [facility] ED 10/19- w/ pleuritic pain worse with deep breath chest x-ray worsening pneumonia. CTA negative for PE, revealed persistent left lower lobe tree-in-bud nodularity suggestive of infection/inflammation, trace bilateral pleural effusion, large hiatal hernia, neuropathy small bowel obstruction query transition point in the mid abdomen.  CT abdomen pelvis with contrast revealed partial small bowel obstruction. Surgery was consulted and patient was admitted. Serial imaging was performed and showed contrast movement into the right colon.  Diet was recommended to be advanced as tolerated per surgery.  Unfortunately she did not advance diet much nor take in adequate nutrition.  She refused attempts of NG tube placement for enteral nutrition.  She had progressive worsening respiratory status requiring increased oxygen demand and transferred to the ICU.  Continuous GOC discussions were held with the patient at bedside who continued to endorse wanting full measures.  Her son was also continuously updated regarding [place] discussions.  Interval History:  Patient having progressive worsening since yesterday.  She has been increased up to 15 L salter high flow this morning.  She has significant secretions and difficulty with clearing them now.  She is still adamant on wanting full aggressive measures including intubation and CPR.  Due to this, she  was transferred to the ICU for further support.  Patient's son was found multiple times today to be given updates.  He understands patient is imminently declining and approaching end-of-life.  We are trying to honor her wishes as best as possible while not pursuing purely futile medical care.  Assessment and Plan: Acute hypoxic respiratory failure Aspiration pneumonia CAP Acute on chronic dCHF - d/c IVF given progressive volume overload -IV Lasix given this morning to help with worsened secretions and volume overload.  No significant urine output - Place Foley catheter given further worsening of clinical status - prognosis extremely poor; patient is going to imminently code unless able to change status although patient for most of the day has been adamant on "full code, full scope, and wants life support, mechanical ventilation". I have had this conversation with her daily and multiple times today (10/27) - if patient loses capacity, will discuss immediately with her son regarding changing code status to DNR/DNI as he is in favor of this if/when patient no longer able to voice her wishes   Shock - likely multifactorial in setting of sepsis, cardiogenic - start levophed but will not escalate beyond 1 vasopressor as would certainly be considered futile treatment; I have all discussed this plan with her son  Goals of care: -Prognosis continuing to worsen. Has not eaten adequate nutrition in several days and has continued to worsen daily due to this; poor physical reserve - Respiratory status also continues to worsen with underlying volume overload despite Lasix; increased O2 demands and now with worsening secretions -Patient still unfortunately wanting to remain full code with full scope of care.  Discussions have continue daily.  See discussions above as well. -Patient transferred to the ICU  for ongoing care; further plan to be determined pending further clinical course  Partial small bowel  obstruction: General surgery following, NG tube was planned but patient refused multiple times.02/06/22 most of the contrast in the stomach, some in the left lower quadrant small bowel, no contrast in the colon indicating high-grade obstruction.Repleted electrolytes.NGT was attempted multiple times without success 10/22>X-ray shows mild amount of contrast in the right colon, no abnormal bowel dilatation.   - ADAT per surgery - poor prognosis given poor PO intake in general; discussed at length with son on 10/24 and 10/25 and daily -Patient unfortunately continues to have expected decline overall due to poor nutritional status in 1 respect -She has declined NG tube placement and enteral nutrition   Hypokalemia replete as needed Hypovolemic hyponatremia - initially treated   Chronic pain/back pain Recent back surgery wheelchair-bound Chronic opiate use: - as patient is becoming less responsive; d/c MS Contin; if she were to awaken can consider IV treatment, but patient is imminently dying   PAF/atypical atrial flutter:  - starting to minimize treatments as patient rapidly declining   Elevated troponin 47-40:?Etiology,repeat flat suspect demand ischemia due to acute medical illness  Old records reviewed in assessment of this patient  Antimicrobials:   DVT prophylaxis:  enoxaparin (LOVENOX) injection 40 mg Start: 01/17/2022 2200   Code Status:   Code Status: Full Code  Mobility Assessment (last 72 hours)     Mobility Assessment     Row Name 02/09/22 2015 02/09/22 1157 02/09/22 1059 02/08/22 2005     Does patient have an order for bedrest or is patient medically unstable No - Continue assessment -- -- No - Continue assessment    What is the highest level of mobility based on the progressive mobility assessment? Level 1 (Bedfast) - Unable to balance while sitting on edge of bed Level 2 (Chairfast) - Balance while sitting on edge of bed and cannot stand Level 2 (Chairfast) - Balance while  sitting on edge of bed and cannot stand Level 2 (Chairfast) - Balance while sitting on edge of bed and cannot stand    Is the above level different from baseline mobility prior to current illness? Yes - Recommend PT order -- -- --             Barriers to discharge:  Disposition Plan:  pending clinical course; prognosis poor; patient imminently approaching end of life  Status is: Inpt  Objective: Blood pressure (!) 91/51, pulse 92, temperature 98.4 F (36.9 C), temperature source Axillary, resp. rate 16, height '5\' 3"'$  (1.6 m), weight 53.6 kg, SpO2 96 %.  Examination:  Physical Exam Constitutional:      Comments: Chronically ill-appearing and very frail/weak elderly woman lying in bed now even more weak with extremely soft/hoarse voice  HENT:     Head: Normocephalic and atraumatic.     Mouth/Throat:     Mouth: Mucous membranes are moist.  Eyes:     Extraocular Movements: Extraocular movements intact.  Cardiovascular:     Rate and Rhythm: Normal rate and regular rhythm.  Pulmonary:     Effort: Respiratory distress present.     Breath sounds: Rhonchi and rales present. No wheezing.  Abdominal:     General: There is no distension.     Palpations: Abdomen is soft.     Comments: Minimal right-sided tenderness without rebound or guarding.  Hypoactive bowel sounds  Musculoskeletal:        General: Normal range of motion.  Cervical back: Normal range of motion.  Skin:    General: Skin is warm.  Neurological:     General: No focal deficit present.  Psychiatric:        Mood and Affect: Mood normal.      Consultants:  General surgery  Procedures:    Data Reviewed: Results for orders placed or performed during the hospital encounter of 02/11/2022 (from the past 24 hour(s))  Glucose, capillary     Status: Abnormal   Collection Time: 02/10/22  4:21 PM  Result Value Ref Range   Glucose-Capillary 129 (H) 70 - 99 mg/dL  Glucose, capillary     Status: Abnormal   Collection  Time: 02/10/22  8:06 PM  Result Value Ref Range   Glucose-Capillary 137 (H) 70 - 99 mg/dL  Glucose, capillary     Status: Abnormal   Collection Time: 02/11/22 12:06 AM  Result Value Ref Range   Glucose-Capillary 129 (H) 70 - 99 mg/dL  Magnesium     Status: None   Collection Time: 02/11/22  2:19 AM  Result Value Ref Range   Magnesium 2.1 1.7 - 2.4 mg/dL  Procalcitonin     Status: None   Collection Time: 02/11/22  2:19 AM  Result Value Ref Range   Procalcitonin 0.74 ng/mL  CBC with Differential/Platelet     Status: Abnormal   Collection Time: 02/11/22  2:19 AM  Result Value Ref Range   WBC 22.8 (H) 4.0 - 10.5 K/uL   RBC 3.76 (L) 3.87 - 5.11 MIL/uL   Hemoglobin 11.6 (L) 12.0 - 15.0 g/dL   HCT 37.6 36.0 - 46.0 %   MCV 100.0 80.0 - 100.0 fL   MCH 30.9 26.0 - 34.0 pg   MCHC 30.9 30.0 - 36.0 g/dL   RDW 14.1 11.5 - 15.5 %   Platelets 432 (H) 150 - 400 K/uL   nRBC 0.0 0.0 - 0.2 %   Neutrophils Relative % 89 %   Neutro Abs 20.3 (H) 1.7 - 7.7 K/uL   Lymphocytes Relative 5 %   Lymphs Abs 1.1 0.7 - 4.0 K/uL   Monocytes Relative 5 %   Monocytes Absolute 1.2 (H) 0.1 - 1.0 K/uL   Eosinophils Relative 0 %   Eosinophils Absolute 0.0 0.0 - 0.5 K/uL   Basophils Relative 0 %   Basophils Absolute 0.1 0.0 - 0.1 K/uL   Immature Granulocytes 1 %   Abs Immature Granulocytes 0.14 (H) 0.00 - 0.07 K/uL  Basic metabolic panel     Status: Abnormal   Collection Time: 02/11/22  2:19 AM  Result Value Ref Range   Sodium 138 135 - 145 mmol/L   Potassium 3.5 3.5 - 5.1 mmol/L   Chloride 105 98 - 111 mmol/L   CO2 20 (L) 22 - 32 mmol/L   Glucose, Bld 129 (H) 70 - 99 mg/dL   BUN 44 (H) 8 - 23 mg/dL   Creatinine, Ser 1.10 (H) 0.44 - 1.00 mg/dL   Calcium 9.4 8.9 - 10.3 mg/dL   GFR, Estimated 49 (L) >60 mL/min   Anion gap 13 5 - 15  Glucose, capillary     Status: Abnormal   Collection Time: 02/11/22  4:00 AM  Result Value Ref Range   Glucose-Capillary 139 (H) 70 - 99 mg/dL  Glucose, capillary      Status: Abnormal   Collection Time: 02/11/22  7:28 AM  Result Value Ref Range   Glucose-Capillary 131 (H) 70 - 99 mg/dL  Glucose, capillary  Status: Abnormal   Collection Time: 02/11/22 11:48 AM  Result Value Ref Range   Glucose-Capillary 152 (H) 70 - 99 mg/dL  Glucose, capillary     Status: Abnormal   Collection Time: 02/11/22  3:11 PM  Result Value Ref Range   Glucose-Capillary 162 (H) 70 - 99 mg/dL   Comment 1 Notify RN    Comment 2 Document in Chart     I have Reviewed nursing notes, Vitals, and Lab results since pt's last encounter. Pertinent lab results : see above I have ordered test including BMP, CBC, Mg I have reviewed the last note from staff over past 24 hours I have discussed pt's care plan and test results with nursing staff, case manager  Critical care time to evaluate and treat this patient was 55 minutes.  Independent of separate billable services  This patient is critically ill with the following life-threatening issues requiring my presence at the bedside: Hemodynamic instability requiring titration of medications Oxygenation/ventilation instability requiring frequent modifications of support Cardiac rhythm disturbances requiring evaluation and/or interventions Fluctuations in neurologic function requiring evaluation and/or interventions and/or fluid/volume titration    LOS: 8 days   Dwyane Dee, MD [facility] 02/11/2022, 3:32 PM

## 2022-02-12 ENCOUNTER — Inpatient Hospital Stay (HOSPITAL_COMMUNITY): Payer: Medicare (Managed Care)

## 2022-02-12 DIAGNOSIS — J9601 Acute respiratory failure with hypoxia: Secondary | ICD-10-CM | POA: Diagnosis not present

## 2022-02-12 DIAGNOSIS — R627 Adult failure to thrive: Secondary | ICD-10-CM | POA: Diagnosis not present

## 2022-02-12 DIAGNOSIS — Z7189 Other specified counseling: Secondary | ICD-10-CM

## 2022-02-12 DIAGNOSIS — R579 Shock, unspecified: Secondary | ICD-10-CM | POA: Diagnosis not present

## 2022-02-12 DIAGNOSIS — K56609 Unspecified intestinal obstruction, unspecified as to partial versus complete obstruction: Secondary | ICD-10-CM | POA: Diagnosis not present

## 2022-02-12 DIAGNOSIS — J690 Pneumonitis due to inhalation of food and vomit: Secondary | ICD-10-CM | POA: Diagnosis not present

## 2022-02-12 LAB — CBC WITH DIFFERENTIAL/PLATELET
Abs Immature Granulocytes: 0.43 10*3/uL — ABNORMAL HIGH (ref 0.00–0.07)
Basophils Absolute: 0.1 10*3/uL (ref 0.0–0.1)
Basophils Relative: 0 %
Eosinophils Absolute: 0 10*3/uL (ref 0.0–0.5)
Eosinophils Relative: 0 %
HCT: 39 % (ref 36.0–46.0)
Hemoglobin: 12.1 g/dL (ref 12.0–15.0)
Immature Granulocytes: 1 %
Lymphocytes Relative: 5 %
Lymphs Abs: 1.5 10*3/uL (ref 0.7–4.0)
MCH: 31.1 pg (ref 26.0–34.0)
MCHC: 31 g/dL (ref 30.0–36.0)
MCV: 100.3 fL — ABNORMAL HIGH (ref 80.0–100.0)
Monocytes Absolute: 1.5 10*3/uL — ABNORMAL HIGH (ref 0.1–1.0)
Monocytes Relative: 4 %
Neutro Abs: 30.1 10*3/uL — ABNORMAL HIGH (ref 1.7–7.7)
Neutrophils Relative %: 90 %
Platelets: 497 10*3/uL — ABNORMAL HIGH (ref 150–400)
RBC: 3.89 MIL/uL (ref 3.87–5.11)
RDW: 14.6 % (ref 11.5–15.5)
WBC: 33.7 10*3/uL — ABNORMAL HIGH (ref 4.0–10.5)
nRBC: 0 % (ref 0.0–0.2)

## 2022-02-12 LAB — MAGNESIUM: Magnesium: 2.1 mg/dL (ref 1.7–2.4)

## 2022-02-12 LAB — GLUCOSE, CAPILLARY
Glucose-Capillary: 133 mg/dL — ABNORMAL HIGH (ref 70–99)
Glucose-Capillary: 152 mg/dL — ABNORMAL HIGH (ref 70–99)
Glucose-Capillary: 145 mg/dL — ABNORMAL HIGH (ref 70–99)
Glucose-Capillary: 152 mg/dL — ABNORMAL HIGH (ref 70–99)

## 2022-02-12 LAB — PROCALCITONIN: Procalcitonin: 2.1 ng/mL

## 2022-02-12 LAB — BASIC METABOLIC PANEL
Anion gap: 7 (ref 5–15)
BUN: 63 mg/dL — ABNORMAL HIGH (ref 8–23)
CO2: 24 mmol/L (ref 22–32)
Calcium: 9.4 mg/dL (ref 8.9–10.3)
Chloride: 109 mmol/L (ref 98–111)
Creatinine, Ser: 1.45 mg/dL — ABNORMAL HIGH (ref 0.44–1.00)
GFR, Estimated: 35 mL/min — ABNORMAL LOW (ref >60)
Glucose, Bld: 132 mg/dL — ABNORMAL HIGH (ref 70–99)
Potassium: 3.6 mmol/L (ref 3.5–5.1)
Sodium: 140 mmol/L (ref 135–145)

## 2022-02-12 MED ORDER — HYDROMORPHONE HCL 1 MG/ML IJ SOLN
1.0000 mg | Freq: Once | INTRAMUSCULAR | Status: AC
  Administered 2022-02-12: 1 mg via INTRAVENOUS
  Filled 2022-02-12: qty 1

## 2022-02-12 MED ORDER — PIPERACILLIN-TAZOBACTAM 3.375 G IVPB
3.3750 g | Freq: Three times a day (TID) | INTRAVENOUS | Status: DC
  Administered 2022-02-12 – 2022-02-13 (×4): 3.375 g via INTRAVENOUS
  Filled 2022-02-12 (×4): qty 50

## 2022-02-12 MED ORDER — ENOXAPARIN SODIUM 30 MG/0.3ML IJ SOSY
30.0000 mg | PREFILLED_SYRINGE | INTRAMUSCULAR | Status: DC
  Administered 2022-02-12: 30 mg via SUBCUTANEOUS
  Filled 2022-02-12: qty 0.3

## 2022-02-12 MED ORDER — FUROSEMIDE 10 MG/ML IJ SOLN
40.0000 mg | Freq: Once | INTRAMUSCULAR | Status: AC
  Administered 2022-02-12: 40 mg via INTRAVENOUS
  Filled 2022-02-12: qty 4

## 2022-02-12 NOTE — Progress Notes (Signed)
Patient is now refusing bath, told the CNA to leaver her alone with nurse present. PT cleaned and changed this morning due to episode of emesis.

## 2022-02-12 NOTE — Progress Notes (Addendum)
       CROSS COVER NOTE  NAME: SHATARIA CRIST MRN: [id number] DOB : 03/17/1933    Date of Service   02/12/2022   HPI/Events of Note   Nursing staff notified me of their concerns for patient having increase secretions and for previous reports of emesis with possible aspiration.  Bedside RN is able to clear some secretions with oral yankauer.  Secretions are thick and brown.  Patient is currently on Levophed and is requiring 10 L salter high flow oxygen.  Remains tachycardic with temp 99 F.   Chest x-ray does show increasing multifocal patchy airspace opacities and small left pleural effusion.  Prior to this admission, patient had been discharged on Augmentin and azithromycin for community-acquired pneumonia. Therapy for this CAP was continued during this admission with cefepime.  Last dose of cefepime was on 10/25.  Current procalcitonin 2.1, WBC increase to 33.7.  During bedside assessment, patient is alert and oriented x4.  She is able to answer questions briefly using 1-2 words.  Breath sounds rhonchi and crackles.  Patient will be given additional dose of Lasix to assess for fluid overload and has been started on Zosyn per pharmacy consult for aspiration pneumonia.   Interventions/ Plan   Chest x-ray  Lasix Zosyn      Raenette Rover, DNP, [place]

## 2022-02-12 NOTE — Progress Notes (Signed)
This nurse was present in the room and witnessed the patient [facility] [name] agree to not be intubated if respiratory status declines. Patient is, and remains alert and oriented x4.

## 2022-02-12 NOTE — Progress Notes (Signed)
Progress Note    [name]   ION:[id number]  DOB: October 28, 1932  DOA: 02/10/2022     9 PCP: Berniece Salines, DO  Initial CC: SOB, abd pain  Hospital Course: Ms. Hittle is an 88 year old female with atrial flutter not on anticoagulation, hypertension, recent back surgery, on chronic high-dose morphine presented with shortness of breath persistent diffuse abdominal pain nausea vomiting and constipation x3 days PTA.  Seen in the [facility] ED 10/16-diagnosed with pneumonia/pleural effusion discharged back to facility on antibiotics. Due to persistent similar pain presented to [facility] ED 10/19- w/ pleuritic pain worse with deep breath chest x-ray worsening pneumonia. CTA negative for PE, revealed persistent left lower lobe tree-in-bud nodularity suggestive of infection/inflammation, trace bilateral pleural effusion, large hiatal hernia, neuropathy small bowel obstruction query transition point in the mid abdomen.  CT abdomen pelvis with contrast revealed partial small bowel obstruction. Surgery was consulted and patient was admitted. Serial imaging was performed and showed contrast movement into the right colon.  Diet was recommended to be advanced as tolerated per surgery.  Unfortunately she did not advance diet much nor take in adequate nutrition.  She refused attempts of NG tube placement for enteral nutrition.  She had progressive worsening respiratory status requiring increased oxygen demand and transferred to the ICU.  Continuous GOC discussions were held with the patient at bedside who continued to endorse wanting full measures.  Her son was also continuously updated regarding [place] discussions.  Interval History:  Patient having progressive worsening since yesterday.  She has been increased up to 15 L salter high flow this morning.  She has significant secretions and difficulty with clearing them now.  She is still adamant on wanting full aggressive measures including intubation and CPR.  Due to this, she  was transferred to the ICU for further support.  Patient's son was found multiple times today to be given updates.  He understands patient is imminently declining and approaching end-of-life.  We are trying to honor her wishes as best as possible while not pursuing purely futile medical care.  Assessment and Plan: Acute hypoxic respiratory failure Aspiration pneumonia CAP Acute on chronic dCHF - d/c IVF given progressive volume overload -IV Lasix given intermittently morning to help with worsened secretions and volume overload. - Place Foley catheter given further worsening of clinical status - prognosis extremely poor; patient is going to imminently code unless able to change status although patient for most of the day has been adamant on "full code, full scope, and wants life support, mechanical ventilation". I have had this conversation with her daily and multiple times today (10/27) - if patient loses capacity, will discuss immediately with her son regarding changing code status to DNR/DNI as he is in favor of this if/when patient no longer able to voice her wishes  - patient still voicing full scope of treatment as of 10/28; again discussed with her son on phone after my evaluation; plan remains the same to as noted above   Shock - likely multifactorial in setting of sepsis, cardiogenic - start levophed but will not escalate beyond 1 vasopressor as would certainly be considered futile treatment; I have all discussed this plan with her son  Goals of care: -Prognosis continuing to worsen. Has not eaten adequate nutrition in several days and has continued to worsen daily due to this; poor physical reserve - Respiratory status also continues to worsen with underlying volume overload despite Lasix; increased O2 demands and now with worsening secretions -Patient still unfortunately wanting  to remain full code with full scope of care.  Discussions have continued daily.  See discussions above as  well. -Patient transferred to the ICU for ongoing care; further plan to be determined pending further clinical course  Partial small bowel obstruction: General surgery following, NG tube was planned but patient refused multiple times. NGT was attempted multiple times without success 10/22>X-ray shows mild amount of contrast in the right colon, no abnormal bowel dilatation.   - ADAT per surgery - poor prognosis given poor PO intake in general; discussed at length with son on 10/24 and 10/25 and daily -Patient unfortunately continues to have expected decline overall due to poor nutritional status amongst multiple other problems -She has declined NG tube placement and enteral nutrition -Due to such severe respiratory status and nausea, NG tube was placed on 02/12/2022 with immediate decompression   Hypokalemia replete as needed Hypovolemic hyponatremia - initially treated   Chronic pain/back pain Recent back surgery wheelchair-bound Chronic opiate use: - as patient is becoming less responsive; d/c MS Contin; if she were to awaken can consider IV treatment, but patient is imminently dying   PAF/atypical atrial flutter:  - starting to minimize treatments as patient rapidly declining   Elevated troponin 47-40:?Etiology,repeat flat suspect demand ischemia due to acute medical illness  Old records reviewed in assessment of this patient  Antimicrobials:   DVT prophylaxis:  enoxaparin (LOVENOX) injection 30 mg Start: 02/12/22 2200   Code Status:   Code Status: Full Code  Mobility Assessment (last 72 hours)     Mobility Assessment     Row Name 02/09/22 2015           Does patient have an order for bedrest or is patient medically unstable No - Continue assessment       What is the highest level of mobility based on the progressive mobility assessment? Level 1 (Bedfast) - Unable to balance while sitting on edge of bed       Is the above level different from baseline mobility prior to  current illness? Yes - Recommend PT order                Barriers to discharge:  Disposition Plan:  pending clinical course; prognosis poor; patient imminently approaching end of life  Status is: Inpt  Objective: Blood pressure (!) 110/54, pulse 96, temperature 98.2 F (36.8 C), resp. rate 16, height '5\' 3"'$  (1.6 m), weight 52.8 kg, SpO2 97 %.  Examination:  Physical Exam Constitutional:      Comments: Chronically ill-appearing and very frail/weak elderly woman lying in bed now even more weak with extremely soft/hoarse voice  HENT:     Head: Normocephalic and atraumatic.     Mouth/Throat:     Mouth: Mucous membranes are moist.  Eyes:     Extraocular Movements: Extraocular movements intact.  Cardiovascular:     Rate and Rhythm: Normal rate and regular rhythm.  Pulmonary:     Effort: Respiratory distress present.     Breath sounds: Rhonchi and rales present. No wheezing.  Abdominal:     General: There is no distension.     Palpations: Abdomen is soft.     Comments: Minimal right-sided tenderness without rebound or guarding.  Hypoactive bowel sounds  Musculoskeletal:        General: Normal range of motion.     Cervical back: Normal range of motion.  Skin:    General: Skin is warm.  Neurological:     General: No focal deficit  present.  Psychiatric:        Mood and Affect: Mood normal.      Consultants:  General surgery  Procedures:    Data Reviewed: Results for orders placed or performed during the hospital encounter of 02/15/2022 (from the past 24 hour(s))  Glucose, capillary     Status: Abnormal   Collection Time: 02/11/22  3:11 PM  Result Value Ref Range   Glucose-Capillary 162 (H) 70 - 99 mg/dL   Comment 1 Notify RN    Comment 2 Document in Chart   Glucose, capillary     Status: Abnormal   Collection Time: 02/11/22  7:18 PM  Result Value Ref Range   Glucose-Capillary 155 (H) 70 - 99 mg/dL   Comment 1 Notify RN    Comment 2 Document in Chart   Glucose,  capillary     Status: Abnormal   Collection Time: 02/11/22 11:06 PM  Result Value Ref Range   Glucose-Capillary 142 (H) 70 - 99 mg/dL   Comment 1 Notify RN    Comment 2 Document in Chart   Magnesium     Status: None   Collection Time: 02/12/22 12:39 AM  Result Value Ref Range   Magnesium 2.1 1.7 - 2.4 mg/dL  CBC with Differential/Platelet     Status: Abnormal   Collection Time: 02/12/22 12:39 AM  Result Value Ref Range   WBC 33.7 (H) 4.0 - 10.5 K/uL   RBC 3.89 3.87 - 5.11 MIL/uL   Hemoglobin 12.1 12.0 - 15.0 g/dL   HCT 39.0 36.0 - 46.0 %   MCV 100.3 (H) 80.0 - 100.0 fL   MCH 31.1 26.0 - 34.0 pg   MCHC 31.0 30.0 - 36.0 g/dL   RDW 14.6 11.5 - 15.5 %   Platelets 497 (H) 150 - 400 K/uL   nRBC 0.0 0.0 - 0.2 %   Neutrophils Relative % 90 %   Neutro Abs 30.1 (H) 1.7 - 7.7 K/uL   Lymphocytes Relative 5 %   Lymphs Abs 1.5 0.7 - 4.0 K/uL   Monocytes Relative 4 %   Monocytes Absolute 1.5 (H) 0.1 - 1.0 K/uL   Eosinophils Relative 0 %   Eosinophils Absolute 0.0 0.0 - 0.5 K/uL   Basophils Relative 0 %   Basophils Absolute 0.1 0.0 - 0.1 K/uL   Immature Granulocytes 1 %   Abs Immature Granulocytes 0.43 (H) 0.00 - 0.07 K/uL  Basic metabolic panel     Status: Abnormal   Collection Time: 02/12/22 12:39 AM  Result Value Ref Range   Sodium 140 135 - 145 mmol/L   Potassium 3.6 3.5 - 5.1 mmol/L   Chloride 109 98 - 111 mmol/L   CO2 24 22 - 32 mmol/L   Glucose, Bld 132 (H) 70 - 99 mg/dL   BUN 63 (H) 8 - 23 mg/dL   Creatinine, Ser 1.45 (H) 0.44 - 1.00 mg/dL   Calcium 9.4 8.9 - 10.3 mg/dL   GFR, Estimated 35 (L) >60 mL/min   Anion gap 7 5 - 15  Procalcitonin - Baseline     Status: None   Collection Time: 02/12/22 12:39 AM  Result Value Ref Range   Procalcitonin 2.10 ng/mL  Glucose, capillary     Status: Abnormal   Collection Time: 02/12/22  4:00 AM  Result Value Ref Range   Glucose-Capillary 133 (H) 70 - 99 mg/dL  Glucose, capillary     Status: Abnormal   Collection Time: 02/12/22  8:10  AM  Result Value  Ref Range   Glucose-Capillary 152 (H) 70 - 99 mg/dL  Glucose, capillary     Status: Abnormal   Collection Time: 02/12/22 11:29 AM  Result Value Ref Range   Glucose-Capillary 145 (H) 70 - 99 mg/dL   Comment 1 Notify RN    Comment 2 Document in Chart     I have Reviewed nursing notes, Vitals, and Lab results since pt's last encounter. Pertinent lab results : see above I have ordered test including BMP, CBC, Mg I have reviewed the last note from staff over past 24 hours I have discussed pt's care plan and test results with nursing staff, case manager  Critical care time to evaluate and treat this patient was 55 minutes.  Independent of separate billable services  This patient is critically ill with the following life-threatening issues requiring my presence at the bedside: Hemodynamic instability requiring titration of medications Oxygenation/ventilation instability requiring frequent modifications of support Cardiac rhythm disturbances requiring evaluation and/or interventions Fluctuations in neurologic function requiring evaluation and/or interventions and/or fluid/volume titration    LOS: 9 days   Dwyane Dee, MD [facility] 02/12/2022, 12:13 PM

## 2022-02-12 NOTE — Consult Note (Signed)
NAME:  [name], MRN:  [id number], DOB:  Oct 20, 1932, LOS: 9 ADMISSION DATE:  02/15/2022, CONSULTATION DATE:  02/12/22 REFERRING MD:  Dr Sabino Gasser, CHIEF COMPLAINT:  pneumonia and respiratory failure   History of Present Illness:  88 year old woman history atrial flutter, hypertension, back surgery with chronic pain and chronic narcotics requirement.  Admitted 10/19 with a partial small bowel obstruction, treated conservatively.  She refused medical interventions and initially NG tube placement.  Course complicated by emesis, presumed aspiration and multifocal pneumonia.  Moved to the ICU with progressive hypoxemic respiratory failure, septic shock requiring low-dose norepinephrine.  Given her cachexia, underlying medical conditions, deconditioning prognosis for meaningful recovery poor.  Discussions regarding GOC have been complicated.  Patient's son understands her poor prognosis and has indicated a desire to minimize invasive interventions, ensure comfort.  The patient has been vocal and consistent that she wants any and all aggressive and invasive interventions.  PCCM consulted to assist in her care and in these discussions.  Pertinent  Medical History   Past Medical History:  Diagnosis Date   Anemia 10-05-12   hgb-9.0 on 09-14-12   Arm vein blood clot 10-05-12   Rt. arm '12   Arthritis 10-05-12   degenerative joint disease,scoliosis spine   Aspiration pneumonia ([place])    Dysrhythmia 10-05-12   tachycardia -tx. Lopressor   GERD (gastroesophageal reflux disease)    H/O esophagitis    H/O hiatal hernia    Tachycardia    Transfusion history 10-05-12   4 units blood '12    Significant Hospital Events: Including procedures, antibiotic start and stop dates in addition to other pertinent events   CT-PA 01/30/2022 >> no pulmonary embolism, no adenopathy, mild right basilar atelectasis, left lower lobe tree-in-bud nodularity, trace effusions CT abdomen and pelvis 01/21/2022 >> large hiatal  hernia, developing small bowel obstruction with probable transition point in the mid abdomen.  Small bowel measures up to 3 cm with air-fluid levels Chest x-ray 02/11/2022 >> bilateral multifocal patchy infiltrates consistent with multifocal pneumonia.  Small and pleural effusion.  Interim History / Subjective:  Norepinephrine 7 10 L/min nasal cannula  Objective   Blood pressure (!) 82/43, pulse 96, temperature 98.4 F (36.9 C), resp. rate 15, height '5\' 3"'$  (1.6 m), weight 52.8 kg, SpO2 94 %.        Intake/Output Summary (Last 24 hours) at 02/12/2022 1512 Last data filed at 02/12/2022 1400 Gross per 24 hour  Intake 365.59 ml  Output 2500 ml  Net -2134.41 ml   Filed Weights   02/11/22 0500 02/11/22 1137 02/12/22 0422  Weight: 54.2 kg 53.6 kg 52.8 kg    Examination: General: Cachectic woman, sitting up in bed HENT: Oropharynx dry.  NG tube in place with bilious dark secretions Lungs: Coarse bilaterally without wheezes Cardiovascular: Irregular, mild tachycardia, distant, no murmur Abdomen: Tympanitic, hypoactive bowel sounds Extremities: No edema Neuro: Lethargic but she will wake up and interact.  Able to phonate.  Answers questions and follows commands.  Resolved Hospital Problem list   Metabolic disarray, hypokalemia, hyponatremia  Assessment & Plan:  Acute hypoxemic respiratory failure Multifocal pneumonia, presumed aspiration pneumonia -Zosyn as ordered -Pulmonary hygiene -DuoNeb as needed -Likely a poor candidate for BiPAP given the aspiration risk -Intubation and mechanical ventilation would not enhance her survival and will not be offered.  Please see discussion below  Septic shock due to pneumonia -Careful IV fluids given her history of hypertension and diastolic dysfunction -Wean norepinephrine as able  Partial small bowel obstruction -  NG tube was placed this morning 10/28 -N.p.o. -Aspiration precautions -Antiemetics if needed -Appreciate surgery  input  Paroxysmal atrial fibrillation/flutter -Not currently on anticoagulation -Following telemetry  Goals of care:  -Patient's prognosis to survive this illness are extremely poor.  She appears to be progressively declining, actively dying.  Her underlying medical conditions, nutritional status, cachexia contribute to this poor prognosis.  Direction of her care has been complicated.  Her son recognizes her poor prognosis and has voiced support for conservative medical care and transition to comfort if she continues to decline.  She has been consistent and adamant that she would want intubation and mechanical ventilation, all aggressive care up to this point.   In my opinion she would not survive intubation or mechanical ventilation, probably not to stability but certainly not to extubation and meaningful quality of life.  Intubation and mechanical ventilation will do nothing to enhance her survival or quality of life and for this reason I will not offer them.  I have explained this to her and she voices understanding.  I explained that we would not use mechanical ventilation but that we do everything else possible to stabilize her current illness.  She agrees.  Her nurse was present when we had this discussion.  She would not survive ACLS, and I would recommend continued discussions with the patient but more importantly with her son to achieve a formal DNR status.  Discussions with her son would certainly be relevant if the patient continues to show evidence that she does not have insight into the ramifications of these interventions.  I would defer other invasive interventions such as central venous catheter placement.  Create a ceiling on her norepinephrine of 10 which can be given through a peripheral IV.   Best Practice (right click and "Reselect all SmartList Selections" daily)   Diet/type: NPO DVT prophylaxis:  GI prophylaxis: PPI Lines: N/A Foley:  N/A Code Status:  limited Last date of  multidisciplinary goals of care discussion [multiple ongoing discussions w Dr Sabino Gasser and w Palliative Care.  I have discussed [place] and specifically mechanical ventilation with her 10/28]  Labs   CBC: Recent Labs  Lab 02/08/22 0404 02/09/22 0307 02/10/22 0247 02/11/22 0219 02/12/22 0039  WBC 19.1* 17.9* 18.7* 22.8* 33.7*  NEUTROABS  --   --  17.1* 20.3* 30.1*  HGB 12.6 12.0 13.1 11.6* 12.1  HCT 39.7 38.2 41.1 37.6 39.0  MCV 99.5 99.5 97.2 100.0 100.3*  PLT 417* 378 458* 432* 497*    Basic Metabolic Panel: Recent Labs  Lab 02/06/22 0220 02/07/22 0230 02/08/22 0404 02/09/22 0307 02/10/22 0247 02/11/22 0219 02/12/22 0039  NA 140   < > 135 136 140 138 140  K 3.1*   < > 3.6 3.7 3.4* 3.5 3.6  CL 105   < > 103 105 110 105 109  CO2 23   < > 18* 20* 21* 20* 24  GLUCOSE 113*   < > 124* 119* 124* 129* 132*  BUN 17   < > 20 24* 28* 44* 63*  CREATININE 0.61   < > 0.76 0.66 0.65 1.10* 1.45*  CALCIUM 9.1   < > 8.8* 8.9 9.6 9.4 9.4  MG 2.1  --   --  1.6* 2.0 2.1 2.1   < > = values in this interval not displayed.   GFR: Estimated Creatinine Clearance: 22.6 mL/min (A) (by C-G formula based on SCr of 1.45 mg/dL (H)). Recent Labs  Lab 02/09/22 [id number] 02/10/22 0247 02/11/22 7494  02/12/22 0039  PROCALCITON 0.34 0.43 0.74 2.10  WBC 17.9* 18.7* 22.8* 33.7*    Liver Function Tests: No results for input(s): "AST", "ALT", "ALKPHOS", "BILITOT", "PROT", "ALBUMIN" in the last 168 hours. No results for input(s): "LIPASE", "AMYLASE" in the last 168 hours. No results for input(s): "AMMONIA" in the last 168 hours.  ABG    Component Value Date/Time   PHART 7.428 (H) 07/26/2010 0850   PCO2ART 30.7 (L) 07/26/2010 0850   PO2ART 178.0 (H) 07/26/2010 0850   HCO3 25.4 01/25/2022 1634   TCO2 24 01/23/2022 1635   ACIDBASEDEF 3.7 (H) 07/26/2010 0850   O2SAT 76.9 02/07/2022 1634     Coagulation Profile: No results for input(s): "INR", "PROTIME" in the last 168 hours.  Cardiac Enzymes: No  results for input(s): "CKTOTAL", "CKMB", "CKMBINDEX", "TROPONINI" in the last 168 hours.  HbA1C: Hgb A1c MFr Bld  Date/Time Value Ref Range Status  02/04/2022 05:00 AM 5.6 4.8 - 5.6 % Final    Comment:    (NOTE) Pre diabetes:          5.7%-6.4%  Diabetes:              >6.4%  Glycemic control for   <7.0% adults with diabetes   03/24/2016 05:06 AM 6.0 (H) 4.8 - 5.6 % Final    Comment:    (NOTE)         Pre-diabetes: 5.7 - 6.4         Diabetes: >6.4         Glycemic control for adults with diabetes: <7.0     CBG: Recent Labs  Lab 02/11/22 1918 02/11/22 2306 02/12/22 0400 02/12/22 0810 02/12/22 1129  GLUCAP 155* 142* 133* 152* 145*    Review of Systems:   As per HPI  Past Medical History:  She,  has a past medical history of Anemia (10-05-12), Arm vein blood clot (10-05-12), Arthritis (10-05-12), Aspiration pneumonia ([place]), Dysrhythmia (10-05-12), GERD (gastroesophageal reflux disease), H/O esophagitis, H/O hiatal hernia, Tachycardia, and Transfusion history (10-05-12).   Surgical History:   Past Surgical History:  Procedure Laterality Date   ABDOMINAL HYSTERECTOMY     APPENDECTOMY     child   BACK SURGERY  10-05-12   x5-last fusion with plates   BREAST ENHANCEMENT SURGERY     CATARACT EXTRACTION, BILATERAL     COLONOSCOPY WITH PROPOFOL N/A 10/23/2012   Procedure: COLONOSCOPY WITH PROPOFOL;  Surgeon: Lear Ng, MD;  Location: [facility];  Service: Endoscopy;  Laterality: N/A;   HOT HEMOSTASIS N/A 10/23/2012   Procedure: HOT HEMOSTASIS (ARGON PLASMA COAGULATION/BICAP);  Surgeon: Lear Ng, MD;  Location: [facility];  Service: Endoscopy;  Laterality: N/A;     Social History:   reports that she has never smoked. She has never used smokeless tobacco. She reports that she does not drink alcohol and does not use drugs.   Family History:  Her family history includes Cancer in her father and mother; Diabetes in her sister. There is no history of Stroke  or CAD.   Allergies Allergies  Allergen Reactions   Ciprofloxacin Rash and Other (See Comments)    Burning     Home Medications  Prior to Admission medications   Medication Sig Start Date End Date Taking? Authorizing Provider  acetaminophen (TYLENOL) 500 MG tablet Take 1,000 mg by mouth every 8 (eight) hours as needed for moderate pain or mild pain.   Yes [provider]  aspirin 81 MG tablet Take 81 mg by mouth  daily.   Yes [provider]  carvedilol (COREG) 6.25 MG tablet Take 1 tablet (6.25 mg total) by mouth 2 (two) times daily. 11/22/21  Yes Tobb, Kardie, DO  chlorhexidine (PERIDEX) 0.12 % solution Use as directed 15 mLs in the mouth or throat 2 (two) times daily. 09/27/21  Yes [provider]  Cholecalciferol (VITAMIN D-3) 125 MCG (5000 UT) TABS Take 1 tablet by mouth daily.   Yes [provider]  famotidine (PEPCID) 20 MG tablet Take 20 mg by mouth at bedtime. 09/22/21  Yes [provider]  ferrous sulfate 325 (65 FE) MG tablet Take 325 mg by mouth daily with breakfast.   Yes [provider]  furosemide (LASIX) 20 MG tablet Take 20 mg by mouth every other day. 09/17/21  Yes [provider]  ipratropium-albuterol (DUONEB) 0.5-2.5 (3) MG/3ML SOLN Take 3 mLs by nebulization every 6 (six) hours as needed. Patient taking differently: Take 3 mLs by nebulization in the morning, at noon, and at bedtime. 10/30/21  Yes Amin, Ankit Chirag, MD  LACTOBACILLUS PO Take 1 capsule by mouth daily.   Yes [provider]  melatonin 3 MG TABS tablet Take 3 mg by mouth at bedtime.   Yes [provider]  metoCLOPramide (REGLAN) 10 MG tablet Take 1 tablet (10 mg total) by mouth every 6 (six) hours. 01/31/22  Yes Maylon Peppers, Jordan Hawks K, DO  morphine (MS CONTIN) 100 MG 12 hr tablet Take 1 tablet (100 mg total) by mouth every 12 (twelve) hours. 10/28/21  Yes [name], MD  Nutritional Supplements (NUTRITIONAL DRINK PO) Take 237 mLs by  mouth in the morning, at noon, and at bedtime. House Protein Shake   Yes [provider]  ondansetron (ZOFRAN) 8 MG tablet Take 8 mg by mouth every 8 (eight) hours as needed for nausea or vomiting.   Yes [provider]  OXYGEN Inhale 2-3 L/min into the lungs as needed (For O2 level below 92%). Via nasal cannula   Yes [provider]  pantoprazole (PROTONIX) 40 MG tablet Take 40 mg by mouth daily.   Yes [provider]  predniSONE (DELTASONE) 20 MG tablet Take 40 mg by mouth daily. For 7 days for pneumonia 02/01/22  Yes [provider]  sertraline (ZOLOFT) 25 MG tablet Take 25 mg by mouth daily. 10/17/21  Yes [provider]  SPIRIVA HANDIHALER 18 MCG inhalation capsule Place 18 mcg into inhaler and inhale daily. 11/08/21  Yes [provider]  sucralfate (CARAFATE) 1 g tablet Take 1 g by mouth 3 (three) times daily before meals. 10/10/21  Yes [provider]  Zinc Oxide 10 % OINT Apply 1 Application topically in the morning and at bedtime.   Yes [provider]  loperamide (IMODIUM) 2 MG capsule Take 2 mg by mouth every 6 (six) hours as needed for diarrhea or loose stools.    [provider]     Critical care time: 21 min     Baltazar Apo, MD, PhD 02/12/2022, 3:40 PM [facility] [phone] or if no answer before 7:00PM call [phone] For any issues after 7:00PM please call eLink [phone]

## 2022-02-13 DIAGNOSIS — J9601 Acute respiratory failure with hypoxia: Secondary | ICD-10-CM | POA: Diagnosis not present

## 2022-02-13 DIAGNOSIS — R6521 Severe sepsis with septic shock: Secondary | ICD-10-CM

## 2022-02-13 DIAGNOSIS — J690 Pneumonitis due to inhalation of food and vomit: Secondary | ICD-10-CM | POA: Diagnosis not present

## 2022-02-13 DIAGNOSIS — R627 Adult failure to thrive: Secondary | ICD-10-CM | POA: Diagnosis not present

## 2022-02-13 DIAGNOSIS — R570 Cardiogenic shock: Secondary | ICD-10-CM

## 2022-02-13 DIAGNOSIS — A419 Sepsis, unspecified organism: Secondary | ICD-10-CM

## 2022-02-13 DIAGNOSIS — K56609 Unspecified intestinal obstruction, unspecified as to partial versus complete obstruction: Secondary | ICD-10-CM | POA: Diagnosis not present

## 2022-02-13 LAB — CBC WITH DIFFERENTIAL/PLATELET
Abs Immature Granulocytes: 0.39 10*3/uL — ABNORMAL HIGH (ref 0.00–0.07)
Basophils Absolute: 0.1 10*3/uL (ref 0.0–0.1)
Basophils Relative: 0 %
Eosinophils Absolute: 0 10*3/uL (ref 0.0–0.5)
Eosinophils Relative: 0 %
HCT: 37.4 % (ref 36.0–46.0)
Hemoglobin: 11.3 g/dL — ABNORMAL LOW (ref 12.0–15.0)
Immature Granulocytes: 1 %
Lymphocytes Relative: 4 %
Lymphs Abs: 1.1 10*3/uL (ref 0.7–4.0)
MCH: 31.1 pg (ref 26.0–34.0)
MCHC: 30.2 g/dL (ref 30.0–36.0)
MCV: 103 fL — ABNORMAL HIGH (ref 80.0–100.0)
Monocytes Absolute: 1.7 10*3/uL — ABNORMAL HIGH (ref 0.1–1.0)
Monocytes Relative: 6 %
Neutro Abs: 25.1 10*3/uL — ABNORMAL HIGH (ref 1.7–7.7)
Neutrophils Relative %: 89 %
Platelets: 444 10*3/uL — ABNORMAL HIGH (ref 150–400)
RBC: 3.63 MIL/uL — ABNORMAL LOW (ref 3.87–5.11)
RDW: 14.5 % (ref 11.5–15.5)
WBC: 28.3 10*3/uL — ABNORMAL HIGH (ref 4.0–10.5)
nRBC: 0 % (ref 0.0–0.2)

## 2022-02-13 LAB — GLUCOSE, CAPILLARY
Glucose-Capillary: 118 mg/dL — ABNORMAL HIGH (ref 70–99)
Glucose-Capillary: 139 mg/dL — ABNORMAL HIGH (ref 70–99)
Glucose-Capillary: 156 mg/dL — ABNORMAL HIGH (ref 70–99)

## 2022-02-13 LAB — BASIC METABOLIC PANEL
Anion gap: 5 (ref 5–15)
BUN: 58 mg/dL — ABNORMAL HIGH (ref 8–23)
CO2: 25 mmol/L (ref 22–32)
Calcium: 9.4 mg/dL (ref 8.9–10.3)
Chloride: 111 mmol/L (ref 98–111)
Creatinine, Ser: 1.1 mg/dL — ABNORMAL HIGH (ref 0.44–1.00)
GFR, Estimated: 49 mL/min — ABNORMAL LOW (ref >60)
Glucose, Bld: 153 mg/dL — ABNORMAL HIGH (ref 70–99)
Potassium: 3.1 mmol/L — ABNORMAL LOW (ref 3.5–5.1)
Sodium: 141 mmol/L (ref 135–145)

## 2022-02-13 LAB — MAGNESIUM: Magnesium: 2.1 mg/dL (ref 1.7–2.4)

## 2022-02-13 MED ORDER — HYDROMORPHONE BOLUS VIA INFUSION
1.0000 mg | INTRAVENOUS | Status: DC | PRN
  Administered 2022-02-13 (×2): 1 mg via INTRAVENOUS

## 2022-02-13 MED ORDER — LORAZEPAM 2 MG/ML IJ SOLN: 1.0000 mg | INTRAMUSCULAR | Status: DC | PRN

## 2022-02-13 MED ORDER — HYDROMORPHONE HCL 1 MG/ML IJ SOLN: 1.0000 mg | INTRAMUSCULAR | Status: DC | PRN

## 2022-02-13 MED ORDER — GLYCOPYRROLATE 1 MG PO TABS: 1.0000 mg | ORAL_TABLET | ORAL | Status: DC | PRN

## 2022-02-13 MED ORDER — LORAZEPAM 1 MG PO TABS: 1.0000 mg | ORAL_TABLET | ORAL | Status: DC | PRN

## 2022-02-13 MED ORDER — GLYCOPYRROLATE 0.2 MG/ML IJ SOLN: 0.2000 mg | INTRAMUSCULAR | Status: DC | PRN

## 2022-02-13 MED ORDER — POTASSIUM CHLORIDE 10 MEQ/50ML IV SOLN
10.0000 meq | INTRAVENOUS | Status: DC
  Administered 2022-02-13 (×3): 10 meq via INTRAVENOUS
  Filled 2022-02-13 (×3): qty 50

## 2022-02-13 MED ORDER — SODIUM CHLORIDE 0.9 % IV SOLN
1.0000 mg/h | INTRAVENOUS | Status: DC
  Administered 2022-02-13: 1 mg/h via INTRAVENOUS
  Filled 2022-02-13: qty 2.5

## 2022-02-13 MED ORDER — LORAZEPAM 2 MG/ML PO CONC
1.0000 mg | ORAL | Status: DC | PRN
  Filled 2022-02-13: qty 0.5

## 2022-02-13 MED ORDER — POTASSIUM CHLORIDE 10 MEQ/100ML IV SOLN: 10.0000 meq | INTRAVENOUS | Status: DC

## 2022-02-16 NOTE — Progress Notes (Signed)
PT Cancellation Note  Patient Details Name: NAIJAH LACEK MRN: [id number] DOB: Jul 17, 1932   Cancelled Treatment:     PT deferred this date and service will be dc.  RN advises pt has moved to comfort care.   Garrett Bowring 2022/02/25, 1:21 PM

## 2022-02-16 NOTE — Progress Notes (Signed)
Progress Note    [name]   JQZ:[id number]  DOB: Aug 29, 1932  DOA: 02/15/2022     10 PCP: Berniece Salines, DO  Initial CC: SOB, abd pain  Hospital Course: [name] is an 88 year old female with atrial flutter not on anticoagulation, hypertension, recent back surgery, on chronic high-dose morphine presented with shortness of breath persistent diffuse abdominal pain nausea vomiting and constipation x3 days PTA.  Seen in the [facility] ED 10/16-diagnosed with pneumonia/pleural effusion discharged back to facility on antibiotics. Due to persistent similar pain presented to [facility] ED 10/19- w/ pleuritic pain worse with deep breath chest x-ray worsening pneumonia. CTA negative for PE, revealed persistent left lower lobe tree-in-bud nodularity suggestive of infection/inflammation, trace bilateral pleural effusion, large hiatal hernia, neuropathy small bowel obstruction query transition point in the mid abdomen.  CT abdomen pelvis with contrast revealed partial small bowel obstruction. Surgery was consulted and patient was admitted. Serial imaging was performed and showed contrast movement into the right colon.  Diet was recommended to be advanced as tolerated per surgery.  Unfortunately she did not advance diet much nor take in adequate nutrition.  She refused attempts of NG tube placement for enteral nutrition.  She had progressive worsening respiratory status requiring increased oxygen demand and transferred to the ICU.  Continuous GOC discussions were held with the patient at bedside who continued to endorse wanting full measures.  Her son was also continuously updated regarding [place] discussions.  Interval History:  Patient minimally responsive this morning and unable to voice any further wishes.  She appeared grossly uncomfortable with ongoing respiratory distress. Discussed with her son on the phone with nurse as witness.  He elected for full transition to DNR/DNI and comfort care. Due to her ongoing  respiratory issues, she was transitioned to a Dilaudid drip to be titrated for comfort.  Assessment and Plan: Acute hypoxic respiratory failure Aspiration pneumonia CAP Acute on chronic dCHF - d/c IVF given progressive volume overload -IV Lasix given intermittently to help with worsened secretions and volume overload. - continue Foley catheter given further worsening of clinical status - prognosis extremely poor; after going [place] discussions, patient now transitioned to comfort care (as of 2022-03-05)  Shock - likely multifactorial in setting of sepsis, cardiogenic - s/p levophed; futile in utility given poor prognosis; discussed with her son further on 10/29 and decision was made to d/c life sustaining measures and focus on comfort   Goals of care: -as noted above, extremely poor prognosis. Has not eaten adequate nutrition in several days and has continued to worsen daily due to this; poor physical reserve - Respiratory status also continues to worsen with underlying volume overload despite Lasix; increased O2 demands and worsening secretions - patient continued to have expected progressive decline; as of 10/29, she's no longer able to adequately interact and has become poorly responsive; with nursing as witness and on the phone with patient's son [name]), decision was made to transition to full comfort care and discontinue life sustaining measures  - comfort care measures implemented; starting dilaudid drip given respiratory distress and high opioid tolerance at baseline given home opioid regimen   Partial small bowel obstruction: General surgery following, NG tube was planned but patient refused multiple times. NGT was attempted multiple times without success 10/22>X-ray shows mild amount of contrast in the right colon, no abnormal bowel dilatation.   - poor prognosis given poor PO intake in general; discussed at length with son on 10/24 and daily -Patient unfortunately continues to have  expected decline overall due to poor nutritional status amongst multiple other problems - continue NGT for comfort at this time; providing decompression    Hypokalemia - now comfort care Hypovolemic hyponatremia - now comfort care Chronic pain/back pain Recent back surgery wheelchair-bound Chronic opiate use: - now comfort care   PAF/atypical atrial flutter:  - now comfort care  Elevated troponin 47-40: now comfort care  Old records reviewed in assessment of this patient  Antimicrobials:   DVT prophylaxis:     Code Status:   Code Status: DNR  Mobility Assessment (last 72 hours)     Mobility Assessment   No documentation.           Barriers to discharge:  Disposition Plan:  pending clinical course; prognosis poor; patient imminently approaching end of life  Status is: Inpt  Objective: Blood pressure (!) 145/87, pulse (!) 124, temperature 98.6 F (37 C), resp. rate (!) 35, height '5\' 3"'$  (1.6 m), weight 50.7 kg, SpO2 (!) 86 %.  Examination:  Physical Exam Constitutional:      Comments: Now minimally responsive; eyes open but can no longer voice wishes or show insight/understanding and answer questions appropriately  HENT:     Head: Normocephalic and atraumatic.     Nose:     Comments: NG tube in place to suction    Mouth/Throat:     Mouth: Mucous membranes are moist.  Eyes:     Extraocular Movements: Extraocular movements intact.  Cardiovascular:     Rate and Rhythm: Regular rhythm. Tachycardia present.  Pulmonary:     Effort: Tachypnea and respiratory distress present.     Breath sounds: Decreased air movement present. Rhonchi and rales present. No wheezing.  Abdominal:     General: There is no distension.     Palpations: Abdomen is soft.     Comments: Minimal right-sided tenderness without rebound or guarding.  Hypoactive bowel sounds  Musculoskeletal:        General: No swelling.     Cervical back: Normal range of motion.  Skin:    General: Skin is  warm.  Neurological:     Comments: Unable to follow any commands      Consultants:  General surgery  Procedures:    Data Reviewed: Results for orders placed or performed during the hospital encounter of 02/10/2022 (from the past 24 hour(s))  Glucose, capillary     Status: Abnormal   Collection Time: 02/12/22  7:59 PM  Result Value Ref Range   Glucose-Capillary 152 (H) 70 - 99 mg/dL   Comment 1 Notify RN    Comment 2 Document in Chart   Glucose, capillary     Status: Abnormal   Collection Time: 02/21/22 12:06 AM  Result Value Ref Range   Glucose-Capillary 139 (H) 70 - 99 mg/dL   Comment 1 Notify RN    Comment 2 Document in Chart   Magnesium     Status: None   Collection Time: Feb 21, 2022  4:08 AM  Result Value Ref Range   Magnesium 2.1 1.7 - 2.4 mg/dL  CBC with Differential/Platelet     Status: Abnormal   Collection Time: 2022-02-21  4:08 AM  Result Value Ref Range   WBC 28.3 (H) 4.0 - 10.5 K/uL   RBC 3.63 (L) 3.87 - 5.11 MIL/uL   Hemoglobin 11.3 (L) 12.0 - 15.0 g/dL   HCT 37.4 36.0 - 46.0 %   MCV 103.0 (H) 80.0 - 100.0 fL   MCH 31.1 26.0 - 34.0 pg  MCHC 30.2 30.0 - 36.0 g/dL   RDW 14.5 11.5 - 15.5 %   Platelets 444 (H) 150 - 400 K/uL   nRBC 0.0 0.0 - 0.2 %   Neutrophils Relative % 89 %   Neutro Abs 25.1 (H) 1.7 - 7.7 K/uL   Lymphocytes Relative 4 %   Lymphs Abs 1.1 0.7 - 4.0 K/uL   Monocytes Relative 6 %   Monocytes Absolute 1.7 (H) 0.1 - 1.0 K/uL   Eosinophils Relative 0 %   Eosinophils Absolute 0.0 0.0 - 0.5 K/uL   Basophils Relative 0 %   Basophils Absolute 0.1 0.0 - 0.1 K/uL   WBC Morphology MORPHOLOGY UNREMARKABLE    Immature Granulocytes 1 %   Abs Immature Granulocytes 0.39 (H) 0.00 - 0.07 K/uL   Burr Cells PRESENT   Basic metabolic panel     Status: Abnormal   Collection Time: 02/18/2022  4:08 AM  Result Value Ref Range   Sodium 141 135 - 145 mmol/L   Potassium 3.1 (L) 3.5 - 5.1 mmol/L   Chloride 111 98 - 111 mmol/L   CO2 25 22 - 32 mmol/L   Glucose, Bld  153 (H) 70 - 99 mg/dL   BUN 58 (H) 8 - 23 mg/dL   Creatinine, Ser 1.10 (H) 0.44 - 1.00 mg/dL   Calcium 9.4 8.9 - 10.3 mg/dL   GFR, Estimated 49 (L) >60 mL/min   Anion gap 5 5 - 15  Glucose, capillary     Status: Abnormal   Collection Time: 2022/02/18  4:12 AM  Result Value Ref Range   Glucose-Capillary 118 (H) 70 - 99 mg/dL   Comment 1 Notify RN    Comment 2 Document in Chart   Glucose, capillary     Status: Abnormal   Collection Time: 02/18/2022  7:46 AM  Result Value Ref Range   Glucose-Capillary 156 (H) 70 - 99 mg/dL   Comment 1 Notify RN     I have Reviewed nursing notes, Vitals, and Lab results since pt's last encounter. Pertinent lab results : see above I have ordered test including BMP, CBC, Mg I have reviewed the last note from staff over past 24 hours I have discussed pt's care plan and test results with nursing staff, case manager  Critical care time to evaluate and treat this patient was 55 minutes.  Independent of separate billable services  This patient is critically ill with the following life-threatening issues requiring my presence at the bedside: Hemodynamic instability requiring titration of medications Oxygenation/ventilation instability requiring frequent modifications of support Cardiac rhythm disturbances requiring evaluation and/or interventions Fluctuations in neurologic function requiring evaluation and/or interventions and/or fluid/volume titration    LOS: 10 days   Dwyane Dee, MD [facility] 02-18-2022, 2:38 PM

## 2022-02-16 NOTE — Discharge Summary (Signed)
Death Summary  PAYSEN GOZA JXB:[id number] DOB: 02-16-1933 DOA: February 27, 2022  PCP: Berniece Salines, DO  Admit date: 02-27-22 Date of Death: 2022-03-09 Time of Death: July 23, 2218 Notification: Family notified of death   History of present illness:  Ms. Hollon is an 88 year old female with atrial flutter not on anticoagulation, hypertension, recent back surgery, on chronic high-dose morphine presented with shortness of breath persistent diffuse abdominal pain nausea vomiting and constipation x3 days PTA.  Seen in the [facility] ED 10/16-diagnosed with pneumonia/pleural effusion discharged back to facility on antibiotics. Due to persistent similar pain presented to [facility] ED 02-28-2023- w/ pleuritic pain worse with deep breath chest x-ray worsening pneumonia. CTA negative for PE, revealed persistent left lower lobe tree-in-bud nodularity suggestive of infection/inflammation, trace bilateral pleural effusion, large hiatal hernia, neuropathy small bowel obstruction query transition point in the mid abdomen.  CT abdomen pelvis with contrast revealed partial small bowel obstruction. Surgery was consulted and patient was admitted. Serial imaging was performed and showed contrast movement into the right colon.  Diet was recommended to be advanced as tolerated per surgery.  Unfortunately she did not advance diet much nor take in adequate nutrition.  She refused attempts of NG tube placement for enteral nutrition.  She had progressive worsening respiratory status requiring increased oxygen demand and transferred to the ICU.   Ongoing [place] discussions were held with the patient but also her son.  As patient continued to have expected progressive decline, she became minimally responsive.  She had undergone aggressive medical measures per her wishes but continued to have ongoing decline and decompensation. Ultimately, she was transitioned to DNR/DNI status and then to comfort care due to extremely poor prognosis and minimal chance of  any meaningful recovery. She had expected progressive and further decline. Comfort was achieved with pain control and air hunger control. Patient passed naturally on 03-09-22 at 2218-07-23.   Final Diagnoses:  Acute hypoxic respiratory failure Aspiration pneumonia CAP Acute on chronic dCHF Septic and cardiogenic shock SBO Failure to thrive   The results of significant diagnostics from this hospitalization (including imaging, microbiology, ancillary and laboratory) are listed below for reference.    Significant Diagnostic Studies: DG Abd 1 View  Result Date: 02/12/2022 CLINICAL DATA:  88 year old female NG tube placement. EXAM: ABDOMEN - 1 VIEW COMPARISON:  CT Abdomen and Pelvis 02/27/22. FINDINGS: Portable AP semi upright view at 0804 hours. Moderate to large gastric hiatal hernia, intrathoracic stomach demonstrated on the recent CT. And the enteric tube is looped above the diaphragm within the hernia. The tube tip is directed toward the hiatus. Lung base ventilation appears improved from the recent CT. Partially visible oral contrast in nondilated large bowel. Extensive spinal degeneration and fusion changes. IMPRESSION: Enteric tube placed into the gastric hiatal hernia. Electronically Signed   By: Genevie Ann M.D.   On: 02/12/2022 08:56   DG Chest Port 1 View  Result Date: 02/11/2022 CLINICAL DATA:  Shortness of breath. EXAM: PORTABLE CHEST 1 VIEW COMPARISON:  Chest x-ray 02/10/2022.  CT of the chest 2022/02/27. FINDINGS: Right upper extremity PICC terminates over the distal SVC. Large hiatal hernia is again noted. Cardiac silhouette within normal limits. Lung volumes are low. There are multifocal patchy airspace opacities in the left upper lobe and bilateral lower lobes, left greater than right. This is increased from prior. There is a small left pleural effusion. There is no pneumothorax or acute fracture. IMPRESSION: 1. Increasing multifocal patchy airspace opacities concerning for multifocal  pneumonia. 2. Small left pleural effusion.  3. Large hiatal hernia. Electronically Signed   By: Ronney Asters M.D.   On: 02/11/2022 22:30   DG CHEST PORT 1 VIEW  Result Date: 02/10/2022 CLINICAL DATA:  Acute hypoxemic respiratory failure EXAM: PORTABLE CHEST 1 VIEW COMPARISON:  Chest 02/08/2022 FINDINGS: Hypoventilation with decreased lung volume unchanged. Bibasilar airspace disease left greater than right unchanged. Mild vascular congestion. Improvement in pulmonary edema. Small pleural effusions. Large hiatal hernia Right arm PICC tip in the right atrium unchanged. IMPRESSION: 1. Hypoventilation with bibasilar airspace disease left greater than right unchanged. 2. Improvement in pulmonary edema. Electronically Signed   By: Franchot Gallo M.D.   On: 02/10/2022 13:38   DG Chest Port 1 View  Result Date: 02/08/2022 CLINICAL DATA:  Shortness of breath EXAM: PORTABLE CHEST 1 VIEW COMPARISON:  Chest x-ray dated February 06, 2022 FINDINGS: Cardiac and mediastinal contours are unchanged. New diffuse bilateral interstitial opacities and small bilateral pleural effusions. Stable position of right arm PICC. No evidence of pneumothorax. IMPRESSION: Pulmonary edema and small bilateral pleural effusions. Electronically Signed   By: Yetta Glassman M.D.   On: 02/08/2022 08:15   DG Abd Portable 1V  Result Date: 02/07/2022 CLINICAL DATA:  Small bowel obstruction. EXAM: PORTABLE ABDOMEN - 1 VIEW COMPARISON:  February 06, 2022. FINDINGS: Mild amount of contrast is seen in the right colon. No abnormal bowel dilatation is noted. Postsurgical changes are seen in the lumbar spine. IMPRESSION: Mild amount of contrast seen in the right colon. No abnormal bowel dilatation. Electronically Signed   By: Marijo Conception M.D.   On: 02/07/2022 08:06   DG Chest Port 1 View  Result Date: 02/06/2022 CLINICAL DATA:  Shortness of breath EXAM: PORTABLE CHEST 1 VIEW COMPARISON:  Previous studies including the examination of  02/05/2022 FINDINGS: Transverse diameter of heart is increased. Large fixed hiatal hernia is seen. Right hemidiaphragm is elevated. There is blunting of both lateral CP angles suggesting bilateral pleural effusions, more so on the right side. There are linear densities in the lower lung fields. There are no signs of alveolar pulmonary edema. No new focal infiltrates are seen. Right PICC line is noted with its tip at the junction of superior vena cava and right atrium. Calcification is seen in augmentation prostheses in the breasts. IMPRESSION: Small to moderate bilateral pleural effusions, more so on the right side. There are no signs of pulmonary edema. There are patchy infiltrates in the lower lung fields suggesting atelectasis/pneumonia. Large fixed hiatal hernia. No significant interval changes are noted. Electronically Signed   By: Elmer Picker M.D.   On: 02/06/2022 15:31   DG Abd Portable 1V-Small Bowel Obstruction Protocol-initial, 8 hr delay  Result Date: 02/06/2022 CLINICAL DATA:  Small bowel obstruction, 8 hour delay EXAM: PORTABLE ABDOMEN - 1 VIEW COMPARISON:  02/04/2022 FINDINGS: A single abdominal images obtained 8 hours after administration of oral contrast material. Most of contrast appears to remain in the stomach with suggestion of some contrast in left lower quadrant small bowel. No contrast is demonstrated in the colon. Changes likely indicate high-grade obstruction. Dilated gas and fluid-filled small bowel consistent with small-bowel obstruction. Small right pleural effusion with basilar atelectasis or consolidation suggested. Postoperative changes in the lumbar spine. IMPRESSION: No contrast material demonstrated in the colon suggesting no evidence of high-grade small bowel obstruction. Electronically Signed   By: Lucienne Capers M.D.   On: 02/06/2022 01:29   DG CHEST PORT 1 VIEW  Result Date: 02/05/2022 CLINICAL DATA:  PICC line placement. EXAM: PORTABLE  CHEST 1 VIEW  COMPARISON:  Two-view chest x-ray 01/19/2022. FINDINGS: Heart is enlarged. Large hiatal hernia is in noted. Right pleural effusion scratched at right greater than left pleural effusion and asymmetric airspace disease is noted. A new right-sided PICC line is in place. The tip is at the cavoatrial junction. IMPRESSION: 1. New right-sided PICC line with the tip at the cavoatrial junction. No radiographic evidence for complication. 2. Cardiomegaly without failure. 3. Right greater than left pleural effusions and asymmetric airspace disease. Electronically Signed   By: San Morelle M.D.   On: 02/05/2022 16:58   [place] EKG SITE RITE  Result Date: 02/05/2022 If Site Rite image not attached, placement could not be confirmed due to current cardiac rhythm.  DG Abd Portable 1V  Result Date: 02/04/2022 CLINICAL DATA:  [id number] SBO (small bowel obstruction) ([place]) [id number], abdominal pain, nausea, vomiting and abdominal distension EXAM: PORTABLE ABDOMEN - 1 VIEW COMPARISON:  09/22/2010 abdominal radiograph, 01/31/2022 CT abdomen/pelvis FINDINGS: Diffuse mild-to-moderate small bowel dilatation up to 4.1 cm diameter, not appreciably changed from CT. Excreted contrast noted in the mildly distended bladder. No evidence of pneumatosis or pneumoperitoneum. Bilateral posterior lumbar spinal fusion hardware. IMPRESSION: No appreciable change in diffuse mild-to-moderate small bowel dilatation, compatible with distal small bowel obstruction. Electronically Signed   By: Ilona Sorrel M.D.   On: 02/04/2022 09:00   CT Angio Chest PE W and/or Wo Contrast  Result Date: 01/17/2022 CLINICAL DATA:  Pulmonary embolism (PE) suspected, high prob; Abdominal pain, acute, nonlocalized. Pt reports she was diagnosed with pneumonia and given abx Monday and had a repeat xray yesterday that showed worsening pneumonia. Per EMS crackles heard in all lung areas. Pt c/o substernal pain on inspiration. EXAM: CT ANGIOGRAPHY CHEST CT ABDOMEN AND  PELVIS WITH CONTRAST TECHNIQUE: Multidetector CT imaging of the chest was performed using the standard protocol during bolus administration of intravenous contrast. Multiplanar CT image reconstructions and MIPs were obtained to evaluate the vascular anatomy. Multidetector CT imaging of the abdomen and pelvis was performed using the standard protocol during bolus administration of intravenous contrast. RADIATION DOSE REDUCTION: This exam was performed according to the departmental dose-optimization program which includes automated exposure control, adjustment of the mA and/or kV according to patient size and/or use of iterative reconstruction technique. CONTRAST:  132m OMNIPAQUE IOHEXOL 350 MG/ML SOLN COMPARISON:  CT abdomen pelvis 01/31/2022 FINDINGS: CTA CHEST FINDINGS Cardiovascular: Satisfactory opacification of the pulmonary arteries to the segmental level. No evidence of pulmonary embolism. Normal heart size. No significant pericardial effusion. The thoracic aorta is normal in caliber. Mild-to-moderate atherosclerotic plaque of the thoracic aorta. At least 3 vessel coronary artery calcifications. Mediastinum/Nodes: No enlarged mediastinal, hilar, or axillary lymph nodes. Thyroid gland, trachea, and esophagus demonstrate no significant findings. Large hiatal hernia. Lungs/Pleura: Right lower lobe passive atelectasis. Persistent left lower lobe tree-in-bud nodularity. No focal consolidation. No pulmonary nodule. No pulmonary mass. Trace right pleural effusion. Trace left pleural effusion. No pneumothorax. Musculoskeletal: Bilateral breast implants. No suspicious lytic or blastic osseous lesions. No acute displaced fracture. Multilevel degenerative changes of the spine. Chronic T10 anterior wedge compression fracture. Bilateral shoulder degenerative changes. Review of the MIP images confirms the above findings. CT ABDOMEN and PELVIS FINDINGS Hepatobiliary: No focal liver abnormality. Question layering density  within the gallbladder lumen suggestive of fat carious excretion of previously administered intravenous contrast. No gallstones, gallbladder wall thickening, or pericholecystic fluid. No biliary dilatation. Pancreas: No focal lesion. Normal pancreatic contour. No surrounding inflammatory changes. No main pancreatic ductal dilatation. Spleen:  Normal in size without focal abnormality. Adrenals/Urinary Tract: No adrenal nodule bilaterally. Bilateral kidneys enhance symmetrically. Subcentimeter hypodensities are too small to characterize. No hydronephrosis. No hydroureter. The urinary bladder is unremarkable. On delayed imaging, there is no urothelial wall thickening and there are no filling defects in the opacified portions of the bilateral collecting systems or ureters. Stomach/Bowel: Stomach is within normal limits. Multiple loops of small bowel demonstrate air-fluid levels on are dilated up to 3 cm. Question developing transition point within the mid abdomen (2:50, 5:48). No evidence of large bowel wall thickening or dilatation. Colonic diverticulosis. Appendix appears normal. Vascular/Lymphatic: No abdominal aorta or iliac aneurysm. Moderate atherosclerotic plaque of the aorta and its branches. No abdominal, pelvic, or inguinal lymphadenopathy. Reproductive: Status post hysterectomy. No adnexal masses. Other: No intraperitoneal free fluid. No intraperitoneal free gas. No organized fluid collection. Musculoskeletal: L1 - S1 posterolateral and interbody surgical hardware fusion. No suspicious lytic or blastic osseous lesions. No acute displaced fracture. Multilevel degenerative changes of the spine. Review of the MIP images confirms the above findings. IMPRESSION: 1. No pulmonary embolus. 2. Persistent left lower lobe tree-in-bud nodularity suggestive of infection/inflammation. 3. Trace bilateral pleural effusions. 4. Large hiatal hernia. 5. Developing small bowel obstruction with query transition point within the  mid abdomen. Small bowel measures up to 3 cm with air-fluid levels. 6. Colonic diverticulosis with no acute diverticulitis. 7.  Aortic Atherosclerosis (ICD10-I70.0). Electronically Signed   By: Iven Finn M.D.   On: 01/17/2022 19:30   CT ABDOMEN PELVIS W CONTRAST  Result Date: 01/31/2022 CLINICAL DATA:  Pulmonary embolism (PE) suspected, high prob; Abdominal pain, acute, nonlocalized. Pt reports she was diagnosed with pneumonia and given abx Monday and had a repeat xray yesterday that showed worsening pneumonia. Per EMS crackles heard in all lung areas. Pt c/o substernal pain on inspiration. EXAM: CT ANGIOGRAPHY CHEST CT ABDOMEN AND PELVIS WITH CONTRAST TECHNIQUE: Multidetector CT imaging of the chest was performed using the standard protocol during bolus administration of intravenous contrast. Multiplanar CT image reconstructions and MIPs were obtained to evaluate the vascular anatomy. Multidetector CT imaging of the abdomen and pelvis was performed using the standard protocol during bolus administration of intravenous contrast. RADIATION DOSE REDUCTION: This exam was performed according to the departmental dose-optimization program which includes automated exposure control, adjustment of the mA and/or kV according to patient size and/or use of iterative reconstruction technique. CONTRAST:  119m OMNIPAQUE IOHEXOL 350 MG/ML SOLN COMPARISON:  CT abdomen pelvis 01/31/2022 FINDINGS: CTA CHEST FINDINGS Cardiovascular: Satisfactory opacification of the pulmonary arteries to the segmental level. No evidence of pulmonary embolism. Normal heart size. No significant pericardial effusion. The thoracic aorta is normal in caliber. Mild-to-moderate atherosclerotic plaque of the thoracic aorta. At least 3 vessel coronary artery calcifications. Mediastinum/Nodes: No enlarged mediastinal, hilar, or axillary lymph nodes. Thyroid gland, trachea, and esophagus demonstrate no significant findings. Large hiatal hernia.  Lungs/Pleura: Right lower lobe passive atelectasis. Persistent left lower lobe tree-in-bud nodularity. No focal consolidation. No pulmonary nodule. No pulmonary mass. Trace right pleural effusion. Trace left pleural effusion. No pneumothorax. Musculoskeletal: Bilateral breast implants. No suspicious lytic or blastic osseous lesions. No acute displaced fracture. Multilevel degenerative changes of the spine. Chronic T10 anterior wedge compression fracture. Bilateral shoulder degenerative changes. Review of the MIP images confirms the above findings. CT ABDOMEN and PELVIS FINDINGS Hepatobiliary: No focal liver abnormality. Question layering density within the gallbladder lumen suggestive of fat carious excretion of previously administered intravenous contrast. No gallstones, gallbladder wall thickening, or pericholecystic fluid.  No biliary dilatation. Pancreas: No focal lesion. Normal pancreatic contour. No surrounding inflammatory changes. No main pancreatic ductal dilatation. Spleen: Normal in size without focal abnormality. Adrenals/Urinary Tract: No adrenal nodule bilaterally. Bilateral kidneys enhance symmetrically. Subcentimeter hypodensities are too small to characterize. No hydronephrosis. No hydroureter. The urinary bladder is unremarkable. On delayed imaging, there is no urothelial wall thickening and there are no filling defects in the opacified portions of the bilateral collecting systems or ureters. Stomach/Bowel: Stomach is within normal limits. Multiple loops of small bowel demonstrate air-fluid levels on are dilated up to 3 cm. Question developing transition point within the mid abdomen (2:50, 5:48). No evidence of large bowel wall thickening or dilatation. Colonic diverticulosis. Appendix appears normal. Vascular/Lymphatic: No abdominal aorta or iliac aneurysm. Moderate atherosclerotic plaque of the aorta and its branches. No abdominal, pelvic, or inguinal lymphadenopathy. Reproductive: Status post  hysterectomy. No adnexal masses. Other: No intraperitoneal free fluid. No intraperitoneal free gas. No organized fluid collection. Musculoskeletal: L1 - S1 posterolateral and interbody surgical hardware fusion. No suspicious lytic or blastic osseous lesions. No acute displaced fracture. Multilevel degenerative changes of the spine. Review of the MIP images confirms the above findings. IMPRESSION: 1. No pulmonary embolus. 2. Persistent left lower lobe tree-in-bud nodularity suggestive of infection/inflammation. 3. Trace bilateral pleural effusions. 4. Large hiatal hernia. 5. Developing small bowel obstruction with query transition point within the mid abdomen. Small bowel measures up to 3 cm with air-fluid levels. 6. Colonic diverticulosis with no acute diverticulitis. 7.  Aortic Atherosclerosis (ICD10-I70.0). Electronically Signed   By: Iven Finn M.D.   On: 01/16/2022 19:30   DG Chest 2 View  Result Date: 01/16/2022 CLINICAL DATA:  Cough. Patient reports being diagnosed with pneumonia and given antibiotics Monday. Substernal chest pain with inspiration. EXAM: CHEST - 2 VIEW COMPARISON:  Radiographs 01/31/2022 and 10/28/2021. Chest CT 10/24/2021. Abdominal CT 01/31/2022. FINDINGS: Persistent low lung volumes with chronic elevation of the right hemidiaphragm. The heart size and mediastinal contours are stable. There is a large hiatal hernia. There are increased patchy airspace opacities at both lung bases with a possible small right pleural effusion. No evidence of pneumothorax. Postsurgical changes are present within the lumbar spine, incompletely visualized. Telemetry leads overlie the chest. IMPRESSION: 1. Increased patchy bibasilar airspace opacities with possible small right pleural effusion, suspicious for pneumonia. 2. Chronic elevation of the right hemidiaphragm and large hiatal hernia. Electronically Signed   By: Richardean Sale M.D.   On: 01/21/2022 14:49   DG Chest 2 View  Result Date:  01/31/2022 CLINICAL DATA:  Upper abdominal pain EXAM: CHEST - 2 VIEW COMPARISON:  Previous studies including the examination of 10/28/2021 FINDINGS: Transverse diameter of heart is slightly increased. Large fixed hiatal hernia is seen. There is increased haziness in left lower lung fields suggesting increase in left pleural effusion and possibly worsening of infiltrate. Rest of the lung fields are essentially clear. There are no signs of alveolar pulmonary edema. Right hemidiaphragm is elevated which may account for some crowding of markings in right lower lung field. IMPRESSION: There is increased opacity in left lower lung fields suggesting increase in pleural effusion and possibly increase in underlying infiltrate. Electronically Signed   By: Elmer Picker M.D.   On: 01/31/2022 12:31   CT Angio Abd/Pel W and/or Wo Contrast  Result Date: 01/31/2022 CLINICAL DATA:  Mesenteric ischemia EXAM: CTA ABDOMEN AND PELVIS WITHOUT AND WITH CONTRAST TECHNIQUE: Multidetector CT imaging of the abdomen and pelvis was performed using the standard protocol during  bolus administration of intravenous contrast. Multiplanar reconstructed images and MIPs were obtained and reviewed to evaluate the vascular anatomy. RADIATION DOSE REDUCTION: This exam was performed according to the departmental dose-optimization program which includes automated exposure control, adjustment of the mA and/or kV according to patient size and/or use of iterative reconstruction technique. CONTRAST:  14m OMNIPAQUE IOHEXOL 350 MG/ML SOLN COMPARISON:  10/24/2021 FINDINGS: VASCULAR Aorta: Normal caliber aorta without aneurysm, dissection, vasculitis or significant stenosis. Celiac: Mild narrowing at the origin due to calcifications, but otherwise patent. SMA: Patent without evidence of aneurysm, dissection, vasculitis or significant stenosis. Renals: Both renal arteries are patent without evidence of aneurysm, dissection, vasculitis, fibromuscular  dysplasia or significant stenosis. IMA: Patent without evidence of aneurysm, dissection, vasculitis or significant stenosis. Inflow: Patent without evidence of aneurysm, dissection, vasculitis or significant stenosis. Proximal Outflow: Bilateral common femoral and visualized portions of the superficial and profunda femoral arteries are patent without evidence of aneurysm, dissection, vasculitis or significant stenosis. Veins: Hepatic, portal, splenic, superior mesenteric veins are patent. Inferior vena cava is patent. Review of the MIP images confirms the above findings. NON-VASCULAR Lower chest: Patchy opacities at the lung bases likely combination of mild pneumonitis and atelectasis. Hepatobiliary: No focal liver abnormality is seen. No gallstones, gallbladder wall thickening, or biliary dilatation. Pancreas: Unremarkable. No pancreatic ductal dilatation or surrounding inflammatory changes. Spleen: Normal in size without focal abnormality. Adrenals/Urinary Tract: Adrenal glands are normal. Subcentimeter renal hypodensities are too small to fully characterize. No hydronephrosis or hydroureter. Bladder is normal. Stomach/Bowel: Large hiatal hernia. Small amount of fluid noted adjacent to the hiatal hernia. No bowel dilatation to indicate ileus or obstruction. Mild diverticulosis of the sigmoid colon without evidence of acute diverticulitis. Appendix is not definitely identified. Lymphatic: No enlarged abdominal or pelvic lymph nodes. Reproductive: Uterus is atrophic versus absent. No adnexal abnormality is identified. Other: Bilateral breast prostheses partially visualized. Interval development of small amount of ascites. Musculoskeletal: L1-S1 fusion changes are seen. No acute osseous abnormality. IMPRESSION: 1. No evidence of mesenteric ischemia. NON-VASCULAR 1. Interval development of small amount of ascites, which extends into the thorax alongside the large hiatal hernia. 2. Mild diverticulosis of the sigmoid  colon without evidence of acute diverticulitis. 3. Patchy opacities at the lung bases likely combination of mild pneumonitis and atelectasis. Electronically Signed   By: FMiachel RouxM.D.   On: 01/31/2022 11:26    Microbiology: No results found for this or any previous visit (from the past 240 hour(s)).   Labs: Basic Metabolic Panel: Recent Labs  Lab 02/09/22 0307 02/10/22 0247 02/11/22 0219 02/12/22 0039 111/10/230408  NA 136 140 138 140 141  K 3.7 3.4* 3.5 3.6 3.1*  CL 105 110 105 109 111  CO2 20* 21* 20* 24 25  GLUCOSE 119* 124* 129* 132* 153*  BUN 24* 28* 44* 63* 58*  CREATININE 0.66 0.65 1.10* 1.45* 1.10*  CALCIUM 8.9 9.6 9.4 9.4 9.4  MG 1.6* 2.0 2.1 2.1 2.1   Liver Function Tests: No results for input(s): "AST", "ALT", "ALKPHOS", "BILITOT", "PROT", "ALBUMIN" in the last 168 hours. No results for input(s): "LIPASE", "AMYLASE" in the last 168 hours. No results for input(s): "AMMONIA" in the last 168 hours. CBC: Recent Labs  Lab 02/09/22 0307 02/10/22 0247 02/11/22 0219 02/12/22 0039 111/10/230408  WBC 17.9* 18.7* 22.8* 33.7* 28.3*  NEUTROABS  --  17.1* 20.3* 30.1* 25.1*  HGB 12.0 13.1 11.6* 12.1 11.3*  HCT 38.2 41.1 37.6 39.0 37.4  MCV 99.5 97.2 100.0 100.3* 103.0*  PLT 378 458* 432* 497* 444*   Cardiac Enzymes: No results for input(s): "CKTOTAL", "CKMB", "CKMBINDEX", "TROPONINI" in the last 168 hours. D-Dimer No results for input(s): "DDIMER" in the last 72 hours. BNP: Invalid input(s): "POCBNP" CBG: Recent Labs  Lab 02/12/22 1129 02/12/22 1959 2022-02-28 0006 02-28-22 0412 02/28/2022 0746  GLUCAP 145* 152* 139* 118* 156*   Anemia work up No results for input(s): "VITAMINB12", "FOLATE", "FERRITIN", "TIBC", "IRON", "RETICCTPCT" in the last 72 hours. Urinalysis    Component Value Date/Time   COLORURINE YELLOW 02/07/2022 1601   APPEARANCEUR CLEAR 02/07/2022 1601   LABSPEC 1.025 02/07/2022 1601   PHURINE 6.0 02/07/2022 1601   GLUCOSEU NEGATIVE  02/07/2022 1601   HGBUR SMALL (A) 02/07/2022 1601   BILIRUBINUR NEGATIVE 02/07/2022 1601   KETONESUR 80 (A) 02/07/2022 1601   PROTEINUR 100 (A) 02/07/2022 1601   UROBILINOGEN 0.2 01/26/2014 2329   NITRITE NEGATIVE 02/07/2022 1601   LEUKOCYTESUR NEGATIVE 02/07/2022 1601   Sepsis Labs Recent Labs  Lab 02/10/22 0247 02/11/22 0219 02/12/22 0039 02-28-2022 0408  WBC 18.7* 22.8* 33.7* 28.3*       SIGNED:  Dwyane Dee, MD  [facility] 02/15/2022, 8:04 AM

## 2022-02-16 NOTE — Consult Note (Signed)
NAME:  [name], MRN:  [id number], DOB:  02-13-1933, LOS: 79 ADMISSION DATE:  02/14/2022, CONSULTATION DATE:  02/12/22 REFERRING MD:  Dr Sabino Gasser, CHIEF COMPLAINT:  pneumonia and respiratory failure   History of Present Illness:  88 year old woman history atrial flutter, hypertension, back surgery with chronic pain and chronic narcotics requirement.  Admitted 10/19 with a partial small bowel obstruction, treated conservatively.  She refused medical interventions and initially NG tube placement.  Course complicated by emesis, presumed aspiration and multifocal pneumonia.  Moved to the ICU with progressive hypoxemic respiratory failure, septic shock requiring low-dose norepinephrine.  Given her cachexia, underlying medical conditions, deconditioning prognosis for meaningful recovery poor.  Discussions regarding GOC have been complicated.  Patient's son understands her poor prognosis and has indicated a desire to minimize invasive interventions, ensure comfort.  The patient has been vocal and consistent that she wants any and all aggressive and invasive interventions.  PCCM consulted to assist in her care and in these discussions 10/28.  Pertinent  Medical History   Past Medical History:  Diagnosis Date   Anemia 10-05-12   hgb-9.0 on 09-14-12   Arm vein blood clot 10-05-12   Rt. arm '12   Arthritis 10-05-12   degenerative joint disease,scoliosis spine   Aspiration pneumonia ([place])    Dysrhythmia 10-05-12   tachycardia -tx. Lopressor   GERD (gastroesophageal reflux disease)    H/O esophagitis    H/O hiatal hernia    Tachycardia    Transfusion history 10-05-12   4 units blood '12    Significant Hospital Events: Including procedures, antibiotic start and stop dates in addition to other pertinent events   CT-PA 02/08/2022 >> no pulmonary embolism, no adenopathy, mild right basilar atelectasis, left lower lobe tree-in-bud nodularity, trace effusions CT abdomen and pelvis 02/04/2022 >> large  hiatal hernia, developing small bowel obstruction with probable transition point in the mid abdomen.  Small bowel measures up to 3 cm with air-fluid levels Chest x-ray 02/11/2022 >> bilateral multifocal patchy infiltrates consistent with multifocal pneumonia.  Small and pleural effusion. 10/28 confirmed DNI status  Interim History / Subjective:   Levophed remains 7 SPO2 98-100% on 10 L/min high flow nasal cannula  Objective   Blood pressure (!) 129/37, pulse 100, temperature 98.1 F (36.7 C), resp. rate (!) 21, height '5\' 3"'$  (1.6 m), weight 50.7 kg, SpO2 100 %.        Intake/Output Summary (Last 24 hours) at 02/21/22 0743 Last data filed at 2022/02/21 9485 Gross per 24 hour  Intake 875.84 ml  Output 2960 ml  Net -2084.16 ml   Filed Weights   02/11/22 1137 02/12/22 0422 02-21-22 0415  Weight: 53.6 kg 52.8 kg 50.7 kg    Examination: General: Cachectic woman, laying on her right side, kyphotic HENT: NG tube in place with dark secretions, oropharynx dry Lungs: Coarse bilateral breath sounds, no wheezing Cardiovascular: Irregular, mild tachycardia, distant Abdomen: Tympanitic, hypoactive bowel sounds Extremities: No edema Neuro: Profoundly weak and lethargic.  Difficulty answering questions or interacting  Resolved Hospital Problem list   Metabolic disarray, hypokalemia, hyponatremia  Assessment & Plan:  Acute hypoxemic respiratory failure Multifocal pneumonia, presumed aspiration pneumonia -Continue Zosyn -DuoNeb as needed -Pulmonary hygiene -Poor candidate for BiPAP given the aspiration risk -Have discussed futility of mechanical ventilation with the patient and confirmed DNI status with her on 10/28.  Septic shock due to pneumonia -IV fluid gentle bolus on 10/29, need to be careful with IV fluids given her history of hypertension and diastolic  dysfunction -Wean norepinephrine as able.  Will change weaning parameters to achieve SBP > 90 -Consider midodrine  Partial  small bowel obstruction -NG tube in place -N.p.o. -Aspiration precautions -Antiemetics if needed -Appreciate surgery evaluation  Paroxysmal atrial fibrillation/flutter -Off anticoagulation -Following telemetry  Goals of care:  -Patient's prognosis to survive this illness are extremely poor.  She appears to be progressively declining.  Her underlying medical conditions, nutritional status, cachexia contribute to this poor prognosis.  After discussion 10/28 regarding futility of mechanical ventilation, she has agreed to DNI status.  She would not survive ACLS and we will need to continue discussions with her regarding this.  She would benefit from a formal DNR status, could continue to have aggressive medical care including her norepinephrine.  Her mental status and wakefulness have both declined.  Not clear to me that she can continue to participate in goals of care discussions.  Based on prior discussions and notes it looks like her son Chriss Czar is interested in changing CODE STATUS to DNR once the patient is no longer able to participate.  Will probably at that point.  I called him this morning 10/29 but did not reach him.  We should continue to work towards confirming a DNR status.   Best Practice (right click and "Reselect all SmartList Selections" daily)   Diet/type: NPO DVT prophylaxis:  GI prophylaxis: PPI Lines: Central line > RUE PICC Foley:  N/A Code Status:  limited Last date of multidisciplinary goals of care discussion [multiple ongoing discussions w Dr Sabino Gasser and w Palliative Care.  Dr. Lamonte Sakai discussed [place] and specifically mechanical ventilation with her 10/28]  Labs   CBC: Recent Labs  Lab 02/09/22 0307 02/10/22 0247 02/11/22 0219 02/12/22 0039 03-12-22 0408  WBC 17.9* 18.7* 22.8* 33.7* 28.3*  NEUTROABS  --  17.1* 20.3* 30.1* 25.1*  HGB 12.0 13.1 11.6* 12.1 11.3*  HCT 38.2 41.1 37.6 39.0 37.4  MCV 99.5 97.2 100.0 100.3* 103.0*  PLT 378 458* 432* 497* 444*    Basic  Metabolic Panel: Recent Labs  Lab 02/09/22 0307 02/10/22 0247 02/11/22 0219 02/12/22 0039 03-12-22 0408  NA 136 140 138 140 141  K 3.7 3.4* 3.5 3.6 3.1*  CL 105 110 105 109 111  CO2 20* 21* 20* 24 25  GLUCOSE 119* 124* 129* 132* 153*  BUN 24* 28* 44* 63* 58*  CREATININE 0.66 0.65 1.10* 1.45* 1.10*  CALCIUM 8.9 9.6 9.4 9.4 9.4  MG 1.6* 2.0 2.1 2.1 2.1   GFR: Estimated Creatinine Clearance: 28.8 mL/min (A) (by C-G formula based on SCr of 1.1 mg/dL (H)). Recent Labs  Lab 02/09/22 0307 02/10/22 0247 02/11/22 0219 02/12/22 0039 2022-03-12 0408  PROCALCITON 0.34 0.43 0.74 2.10  --   WBC 17.9* 18.7* 22.8* 33.7* 28.3*    Liver Function Tests: No results for input(s): "AST", "ALT", "ALKPHOS", "BILITOT", "PROT", "ALBUMIN" in the last 168 hours. No results for input(s): "LIPASE", "AMYLASE" in the last 168 hours. No results for input(s): "AMMONIA" in the last 168 hours.  ABG    Component Value Date/Time   PHART 7.428 (H) 07/26/2010 0850   PCO2ART 30.7 (L) 07/26/2010 0850   PO2ART 178.0 (H) 07/26/2010 0850   HCO3 25.4 02/02/2022 1634   TCO2 24 01/26/2022 1635   ACIDBASEDEF 3.7 (H) 07/26/2010 0850   O2SAT 76.9 02/05/2022 1634      Critical care time: na     Baltazar Apo, MD, PhD Mar 12, 2022, 7:43 AM [facility] [phone] or if no answer  before 7:00PM call [phone] For any issues after 7:00PM please call eLink [phone]

## 2022-02-16 NOTE — Progress Notes (Signed)
Added water to 02 system- uneventful.

## 2022-02-16 NOTE — Progress Notes (Addendum)
    OVERNIGHT PROGRESS REPORT  Notified by RN that patient has expired at 2220 hrs.  Patient was DNR/CMO (comfort care).  2 RN verified.  Family was not immediately available to RN but have been notified.    Gershon Cull MSNA ACNPC-AG Acute Care Nurse Practitioner [place]

## 2022-02-16 DEATH — deceased
